# Patient Record
Sex: Female | Born: 1990 | Race: Black or African American | Hispanic: No | Marital: Single | Smoking: Never smoker
Health system: Southern US, Community
[De-identification: ages and names within clinical notes are randomized; demographics above are authoritative.]

## PROBLEM LIST (undated history)

## (undated) ENCOUNTER — Inpatient Hospital Stay (HOSPITAL_COMMUNITY): Payer: Self-pay

## (undated) DIAGNOSIS — N760 Acute vaginitis: Secondary | ICD-10-CM

## (undated) DIAGNOSIS — N390 Urinary tract infection, site not specified: Secondary | ICD-10-CM

## (undated) DIAGNOSIS — F32A Depression, unspecified: Secondary | ICD-10-CM

## (undated) DIAGNOSIS — F329 Major depressive disorder, single episode, unspecified: Secondary | ICD-10-CM

## (undated) DIAGNOSIS — A749 Chlamydial infection, unspecified: Secondary | ICD-10-CM

## (undated) DIAGNOSIS — G43909 Migraine, unspecified, not intractable, without status migrainosus: Secondary | ICD-10-CM

## (undated) DIAGNOSIS — F419 Anxiety disorder, unspecified: Secondary | ICD-10-CM

## (undated) DIAGNOSIS — B9689 Other specified bacterial agents as the cause of diseases classified elsewhere: Secondary | ICD-10-CM

## (undated) DIAGNOSIS — D649 Anemia, unspecified: Secondary | ICD-10-CM

## (undated) HISTORY — PX: NO PAST SURGERIES: SHX2092

---
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
## 2011-01-12 LAB — HIV ANTIBODY (ROUTINE TESTING W REFLEX): HIV: NONREACTIVE

## 2011-01-12 LAB — TYPE AND SCREEN: Antibody Screen: NEGATIVE

## 2011-01-27 ENCOUNTER — Emergency Department (HOSPITAL_COMMUNITY)
Admission: EM | Admit: 2011-01-27 | Discharge: 2011-01-28 | Disposition: A | Discharge status: Home / Self Care | Payer: Medicaid Other | Attending: Emergency Medicine | Admitting: Emergency Medicine

## 2011-01-27 DIAGNOSIS — R109 Unspecified abdominal pain: Secondary | ICD-10-CM | POA: Insufficient documentation

## 2011-01-27 LAB — URINALYSIS, ROUTINE W REFLEX MICROSCOPIC
Bilirubin Urine: NEGATIVE
Glucose, UA: NEGATIVE mg/dL
Hgb urine dipstick: NEGATIVE
Nitrite: NEGATIVE
Protein, ur: NEGATIVE mg/dL
Specific Gravity, Urine: 1.02 (ref 1.005–1.030)
Urobilinogen, UA: 0.2 mg/dL (ref 0.0–1.0)
pH: 5.5 (ref 5.0–8.0)

## 2011-04-16 ENCOUNTER — Emergency Department (HOSPITAL_COMMUNITY)
Admission: EM | Admit: 2011-04-16 | Discharge: 2011-04-16 | Disposition: A | Discharge status: Home / Self Care | Payer: Medicaid Other | Attending: Emergency Medicine | Admitting: Emergency Medicine

## 2011-04-16 DIAGNOSIS — A499 Bacterial infection, unspecified: Secondary | ICD-10-CM | POA: Insufficient documentation

## 2011-04-16 DIAGNOSIS — N390 Urinary tract infection, site not specified: Secondary | ICD-10-CM | POA: Insufficient documentation

## 2011-04-16 DIAGNOSIS — O2390 Unspecified genitourinary tract infection in pregnancy, unspecified trimester: Secondary | ICD-10-CM | POA: Insufficient documentation

## 2011-04-16 DIAGNOSIS — N760 Acute vaginitis: Secondary | ICD-10-CM | POA: Insufficient documentation

## 2011-04-16 DIAGNOSIS — B9689 Other specified bacterial agents as the cause of diseases classified elsewhere: Secondary | ICD-10-CM | POA: Insufficient documentation

## 2011-04-16 LAB — CBC
HCT: 29.7 % — ABNORMAL LOW (ref 36.0–46.0)
Hemoglobin: 9.9 g/dL — ABNORMAL LOW (ref 12.0–15.0)
MCH: 28.9 pg (ref 26.0–34.0)
MCHC: 33.3 g/dL (ref 30.0–36.0)
MCV: 86.8 fL (ref 78.0–100.0)
Platelets: 188 10*3/uL (ref 150–400)
RBC: 3.42 MIL/uL — ABNORMAL LOW (ref 3.87–5.11)
RDW: 12.9 % (ref 11.5–15.5)
WBC: 9.4 10*3/uL (ref 4.0–10.5)

## 2011-04-16 LAB — DIFFERENTIAL
Basophils Absolute: 0 10*3/uL (ref 0.0–0.1)
Basophils Relative: 0 % (ref 0–1)
Eosinophils Absolute: 0.1 10*3/uL (ref 0.0–0.7)
Eosinophils Relative: 1 % (ref 0–5)
Lymphocytes Relative: 22 % (ref 12–46)
Lymphs Abs: 2.1 10*3/uL (ref 0.7–4.0)
Monocytes Absolute: 0.8 10*3/uL (ref 0.1–1.0)
Monocytes Relative: 9 % (ref 3–12)
Neutro Abs: 6.4 10*3/uL (ref 1.7–7.7)
Neutrophils Relative %: 68 % (ref 43–77)

## 2011-04-16 LAB — URINALYSIS, ROUTINE W REFLEX MICROSCOPIC
Glucose, UA: NEGATIVE mg/dL
Hgb urine dipstick: NEGATIVE
Protein, ur: NEGATIVE mg/dL
Specific Gravity, Urine: >1.03 — ABNORMAL HIGH (ref 1.005–1.030)
Urobilinogen, UA: 0.2 mg/dL (ref 0.0–1.0)

## 2011-04-16 LAB — WET PREP, GENITAL
Trich, Wet Prep: NONE SEEN
Yeast Wet Prep HPF POC: NONE SEEN

## 2011-04-16 LAB — ABO/RH: ABO/RH(D): A NEG

## 2011-04-16 LAB — URINE MICROSCOPIC-ADD ON

## 2011-04-18 LAB — URINE CULTURE: Culture: NO GROWTH

## 2011-04-20 ENCOUNTER — Inpatient Hospital Stay (HOSPITAL_COMMUNITY)
Admission: AD | Admit: 2011-04-20 | Discharge: 2011-04-20 | Disposition: A | Discharge status: Home / Self Care | Payer: Medicaid Other | Source: Ambulatory Visit | Attending: Obstetrics & Gynecology | Admitting: Obstetrics & Gynecology

## 2011-04-20 DIAGNOSIS — O99891 Other specified diseases and conditions complicating pregnancy: Secondary | ICD-10-CM | POA: Insufficient documentation

## 2011-04-20 DIAGNOSIS — R109 Unspecified abdominal pain: Secondary | ICD-10-CM | POA: Insufficient documentation

## 2011-04-20 LAB — URINALYSIS, ROUTINE W REFLEX MICROSCOPIC
Bilirubin Urine: NEGATIVE
Hgb urine dipstick: NEGATIVE
Ketones, ur: NEGATIVE mg/dL
Nitrite: NEGATIVE
Protein, ur: NEGATIVE mg/dL
Urobilinogen, UA: 0.2 mg/dL (ref 0.0–1.0)

## 2011-04-20 LAB — WET PREP, GENITAL
Clue Cells Wet Prep HPF POC: NONE SEEN
Trich, Wet Prep: NONE SEEN

## 2011-05-07 ENCOUNTER — Inpatient Hospital Stay (HOSPITAL_COMMUNITY)
Admission: AD | Admit: 2011-05-07 | Discharge: 2011-05-07 | Disposition: A | Discharge status: Home / Self Care | Payer: Medicaid Other | Source: Ambulatory Visit | Attending: Family Medicine | Admitting: Family Medicine

## 2011-05-07 DIAGNOSIS — O4700 False labor before 37 completed weeks of gestation, unspecified trimester: Secondary | ICD-10-CM

## 2011-05-07 DIAGNOSIS — R109 Unspecified abdominal pain: Secondary | ICD-10-CM

## 2011-05-07 LAB — WET PREP, GENITAL
Clue Cells Wet Prep HPF POC: NONE SEEN
Trich, Wet Prep: NONE SEEN
Yeast Wet Prep HPF POC: NONE SEEN

## 2011-05-07 LAB — URINALYSIS, ROUTINE W REFLEX MICROSCOPIC
Glucose, UA: NEGATIVE mg/dL
Leukocytes, UA: NEGATIVE
Protein, ur: NEGATIVE mg/dL
Specific Gravity, Urine: 1.01 (ref 1.005–1.030)
pH: 7 (ref 5.0–8.0)

## 2011-05-08 ENCOUNTER — Emergency Department (HOSPITAL_COMMUNITY)
Admission: EM | Admit: 2011-05-08 | Discharge: 2011-05-09 | Disposition: A | Discharge status: Still In Hospital | Payer: Medicaid Other | Source: Home / Self Care | Attending: Emergency Medicine | Admitting: Emergency Medicine

## 2011-05-09 ENCOUNTER — Inpatient Hospital Stay (HOSPITAL_COMMUNITY)
Admission: AD | Admit: 2011-05-09 | Discharge: 2011-05-10 | DRG: 778 | Disposition: A | Discharge status: Home Health | Payer: Medicaid Other | Source: Other Acute Inpatient Hospital | Attending: Obstetrics & Gynecology | Admitting: Obstetrics & Gynecology

## 2011-05-09 DIAGNOSIS — O4700 False labor before 37 completed weeks of gestation, unspecified trimester: Secondary | ICD-10-CM

## 2011-05-09 LAB — URINALYSIS, ROUTINE W REFLEX MICROSCOPIC
Bilirubin Urine: NEGATIVE
Glucose, UA: 100 mg/dL — AB
Leukocytes, UA: NEGATIVE
Nitrite: NEGATIVE
Specific Gravity, Urine: >1.03 — ABNORMAL HIGH (ref 1.005–1.030)
pH: 7 (ref 5.0–8.0)

## 2011-05-09 LAB — GC/CHLAMYDIA PROBE AMP, GENITAL: Chlamydia, DNA Probe: NEGATIVE

## 2011-05-15 NOTE — Discharge Summary (Signed)
  Patricia [name], Patricia [name]              ACCOUNT NO.:  [id number]  MEDICAL RECORD NO.:  [id number]  LOCATION:  9158                          FACILITY:  [facility]  PHYSICIAN:  Maryelizabeth Kaufmann, MD  DATE OF BIRTH:  17-Jun-1991  DATE OF ADMISSION:  05/09/2011 DATE OF DISCHARGE:  05/10/2011                              DISCHARGE SUMMARY   ADMISSION DIAGNOSES: 1. With intrauterine pregnancy at 26 weeks and 5 days. 2. Threatened preterm labor.  DISCHARGE DIAGNOSES: 1. Intrauterine pregnancy at 26 weeks and 6 days. 2. Threatened preterm labor resolved.  ATTENDING:  Lazaro Arms, MD   FELLOW: Maryelizabeth Kaufmann, MD  DISCHARGE MEDICATIONS:  Procardia XL 30 mg 1 tablet p.o. b.i.d.  LABORATORY VALUES AND MEDICATIONS:  She did receive magnesium for the full 24 hours and she did receive betamethasone x2.  PROCEDURES:  None.  ADMISSION HISTORY AND PHYSICAL/HOSPITAL COURSE:  This is a 20 year old gravida 1 with intrauterine pregnancy at 26 weeks and 5 days presenting with contractions.  The patient was previously seen for threatened preterm labor but she did not fill her prescription.  Her fetal fibronectin was noted to see positive.  She was sen at an OSH and subsequently transferred.  She was not ruptured.  Her cervix was closed but 50% effaced.  Fetal heart rate tracing was category I.  The patient was admitted for preterm labor.  The patient was started on magnesium.  The patient was continued on that for the full betamethasone course.  She received one at the time of admission and subsequently the next day.  Following the completion of her betamethasone course, she was transitioned over to p.o. Procardia.  The patient did not have any contractions.  The patient's cervix was rechecked, was still found to be closed, 50%, -3.  She was discharged home in stable condition.  DISPOSITION:  Discharged home.  DISCHARGE CONDITION:  Stable.  FOLLOWUP:  The patient is to follow up in [facility]  in the morning as previously scheduled.  ER WARNING:  The patient to return to the emergency department if any fever, chills, nausea, vomiting, contractions, bleeding, spotting, or any other concerning symptoms.          ______________________________ Maryelizabeth Kaufmann, MD     LC/MEDQ  D:  05/10/2011  T:  05/11/2011  Job:  [id number]  Electronically Signed by Maryelizabeth Kaufmann MD on 05/15/2011 07:38:52 PM

## 2011-06-03 ENCOUNTER — Inpatient Hospital Stay (HOSPITAL_COMMUNITY): Payer: Medicaid Other | Admitting: Family Medicine

## 2011-06-03 ENCOUNTER — Encounter (HOSPITAL_COMMUNITY): Payer: Self-pay | Admitting: *Deleted

## 2011-06-03 ENCOUNTER — Inpatient Hospital Stay (HOSPITAL_COMMUNITY)
Admission: AD | Admit: 2011-06-03 | Discharge: 2011-06-03 | Disposition: A | Discharge status: Home / Self Care | Payer: Medicaid Other | Source: Ambulatory Visit | Attending: Family Medicine | Admitting: Family Medicine

## 2011-06-03 DIAGNOSIS — O9989 Other specified diseases and conditions complicating pregnancy, childbirth and the puerperium: Secondary | ICD-10-CM

## 2011-06-03 DIAGNOSIS — M549 Dorsalgia, unspecified: Secondary | ICD-10-CM

## 2011-06-03 DIAGNOSIS — O99891 Other specified diseases and conditions complicating pregnancy: Secondary | ICD-10-CM | POA: Insufficient documentation

## 2011-06-03 LAB — URINALYSIS, ROUTINE W REFLEX MICROSCOPIC
Hgb urine dipstick: NEGATIVE
Nitrite: NEGATIVE
Specific Gravity, Urine: 1.01 (ref 1.005–1.030)
Urobilinogen, UA: 0.2 mg/dL (ref 0.0–1.0)

## 2011-06-03 LAB — WET PREP, GENITAL: Trich, Wet Prep: NONE SEEN

## 2011-06-03 MED ORDER — CYCLOBENZAPRINE HCL 10 MG PO TABS
10.0000 mg | ORAL_TABLET | ORAL | Status: AC
  Administered 2011-06-03: 10 mg via ORAL
  Filled 2011-06-03: qty 1

## 2011-06-03 MED ORDER — CYCLOBENZAPRINE HCL 10 MG PO TABS: 10.0000 mg | ORAL_TABLET | Freq: Three times a day (TID) | ORAL | Status: DC | PRN

## 2011-06-03 NOTE — Progress Notes (Signed)
Back pain onset this morning all day, no vaginal bleeding, no discharge, no painful urination

## 2011-06-03 NOTE — ED Provider Notes (Signed)
History     Chief Complaint  Patient presents with  . Back Pain   HPI  OB History    Grav Para Term Preterm Abortions TAB SAB Ect Mult Living   1 0  0            Past Medical History  Diagnosis Date  . No pertinent past medical history   . Preterm labor without delivery in second trimester June 2012    Positive FFN in June    Past Surgical History  Procedure Date  . No past surgeries     History reviewed. No pertinent family history.  History  Substance Use Topics  . Smoking status: Never Smoker   . Smokeless tobacco: Never Used  . Alcohol Use: No    Allergies: No Known Allergies  Prescriptions prior to admission  Medication Sig Dispense Refill  . NIFEdipine (PROCARDIA XL/ADALAT-CC) 30 MG 24 hr tablet Take 30 mg by mouth 2 (two) times daily.          ROS Physical Exam   Blood pressure 115/58, pulse 86, temperature 98.5 F (36.9 C), temperature source Oral, resp. rate 16, height 5\' 7"  (1.702 m), weight 101.334 kg (223 lb 6.4 oz), unknown if currently breastfeeding.  Physical Exam Back pain relieved completely. States  Has some lower abd. Pain that comes and goes. No contractions on monitor. Suspect she may be due for her next dose of Procardia . WIll d/c home and have her take her med and rest tonight. PTL precautions.  MAU Course  Procedures  MDM

## 2011-06-03 NOTE — Progress Notes (Signed)
Pt c/o of lower backpain since 0230 this morning-it is cnstant-pt isd relaxed

## 2011-06-03 NOTE — ED Provider Notes (Signed)
History   G1 P0 at 30.2 weeks presents with c/o one day history of back pain. She states she had this same back pain a month ago associated with preterm labor, but this back pain only started today. It starts from the base of her neck and extends to the coccyx. No dysuria. No bleeding. No contractions.   Chief Complaint  Patient presents with  . Back Pain   Back Pain This is a recurrent problem. The current episode started today. The problem occurs constantly. The problem is unchanged. The pain is present in the gluteal, lumbar spine and thoracic spine. The quality of the pain is described as aching and cramping. The pain does not radiate. The pain is moderate. The pain is the same all the time. Pertinent negatives include no dysuria, fever or headaches.     Past Medical History  Diagnosis Date  . No pertinent past medical history   . Preterm labor without delivery in second trimester June 2012    Positive FFN in June    Past Surgical History  Procedure Date  . No past surgeries     History reviewed. No pertinent family history.  History  Substance Use Topics  . Smoking status: Never Smoker   . Smokeless tobacco: Never Used  . Alcohol Use: No    Allergies: No Known Allergies  No prescriptions prior to admission    [name]  Constitutional: Negative for fever.  Genitourinary: Negative for dysuria, urgency and frequency.  Musculoskeletal: Positive for back pain.  Neurological: Negative for headaches.   Physical Exam   Blood pressure 113/53, pulse 69, temperature 98.5 F (36.9 C), temperature source Oral, resp. rate 16, height 5\' 7"  (1.702 m), weight 101.334 kg (223 lb 6.4 oz).  Physical Exam  Constitutional: She is oriented to person, place, and time. She appears well-developed and well-nourished. No distress.  HENT:  Head: Normocephalic.  Neck: Normal range of motion. Neck supple.  Cardiovascular: Normal rate.   Respiratory: Effort normal.  GI: Soft.  She exhibits no distension. There is no tenderness. There is no rebound and no guarding.  Genitourinary: Vagina normal and uterus normal.  Musculoskeletal: Normal range of motion.  Neurological: She is alert and oriented to person, place, and time.  Skin: Skin is warm and dry.  FHR reassuring.  NO contractions .  Cervix closed/50%/-3/vtx  Unchanged from a month ago. GC/Chlamydia/Wet prep done  MAU Course  Procedures  MDM

## 2011-06-05 LAB — GC/CHLAMYDIA PROBE AMP, GENITAL
Chlamydia, DNA Probe: NEGATIVE
GC Probe Amp, Genital: NEGATIVE

## 2011-06-07 ENCOUNTER — Ambulatory Visit: Payer: Medicaid Other

## 2011-06-09 ENCOUNTER — Inpatient Hospital Stay (HOSPITAL_COMMUNITY)
Admission: AD | Admit: 2011-06-09 | Discharge: 2011-06-09 | Disposition: A | Discharge status: Home / Self Care | Payer: Medicaid Other | Source: Ambulatory Visit | Attending: Obstetrics & Gynecology | Admitting: Obstetrics & Gynecology

## 2011-06-09 DIAGNOSIS — O4700 False labor before 37 completed weeks of gestation, unspecified trimester: Secondary | ICD-10-CM | POA: Insufficient documentation

## 2011-06-09 DIAGNOSIS — B9689 Other specified bacterial agents as the cause of diseases classified elsewhere: Secondary | ICD-10-CM

## 2011-06-09 DIAGNOSIS — N760 Acute vaginitis: Secondary | ICD-10-CM

## 2011-06-09 DIAGNOSIS — O479 False labor, unspecified: Secondary | ICD-10-CM

## 2011-06-09 LAB — WET PREP, GENITAL: Trich, Wet Prep: NONE SEEN

## 2011-06-09 LAB — URINALYSIS, ROUTINE W REFLEX MICROSCOPIC
Bilirubin Urine: NEGATIVE
Ketones, ur: 15 mg/dL — AB
Nitrite: NEGATIVE
Urobilinogen, UA: 1 mg/dL (ref 0.0–1.0)

## 2011-06-09 MED ORDER — NIFEDIPINE 10 MG PO CAPS
ORAL_CAPSULE | ORAL | Status: AC
  Filled 2011-06-09: qty 1

## 2011-06-09 MED ORDER — NIFEDIPINE 10 MG PO CAPS
10.0000 mg | ORAL_CAPSULE | Freq: Once | ORAL | Status: AC
  Administered 2011-06-09: 10 mg via ORAL

## 2011-06-09 MED ORDER — METRONIDAZOLE 500 MG PO TABS: 500.0000 mg | ORAL_TABLET | Freq: Two times a day (BID) | ORAL | Status: AC

## 2011-06-09 MED ORDER — NIFEDIPINE ER OSMOTIC RELEASE 30 MG PO TB24: 30.0000 mg | ORAL_TABLET | Freq: Two times a day (BID) | ORAL | Status: DC

## 2011-06-09 NOTE — Progress Notes (Signed)
UP TO B-ROOM 

## 2011-06-09 NOTE — Progress Notes (Signed)
DR CAMPBELL- PELVIC EXAM- COLLECTED CX

## 2011-06-09 NOTE — Progress Notes (Signed)
DR CAMPBELL IN ROOM

## 2011-06-09 NOTE — Progress Notes (Signed)
UP TO B-ROOM  OFTEN

## 2011-06-09 NOTE — ED Provider Notes (Addendum)
Chief Complaint:  Labor Eval   Patricia [name] is  20 y.o. G1P0000 at [redacted]w[redacted]d presents complaining of Labor Eval . She states regular, every 8 minutes associated with none and vaginal bleeding, intact, ?ROM but denies any current or cont leakage, along with active No additional complaints  Obstetrical/Gynecological History: OB History    Grav Para Term Preterm Abortions TAB SAB Ect Mult Living   1 0  0          h/o PTL s/p procardia and BMZ   Past Medical History: Past Medical History  Diagnosis Date  . No pertinent past medical history   . Preterm labor without delivery in second trimester June 2012    Positive FFN in June    Past Surgical History: Past Surgical History  Procedure Date  . No past surgeries     Family History: No family history on file.  Social History: History  Substance Use Topics  . Smoking status: Never Smoker   . Smokeless tobacco: Never Used  . Alcohol Use: No    Allergies: No Known Allergies  Prescriptions prior to admission  Medication Sig Dispense Refill  . NIFEdipine (PROCARDIA XL/ADALAT-CC) 30 MG 24 hr tablet Take 30 mg by mouth 2 (two) times daily.        . prenatal vitamin w/FE, FA (PRENATAL 1 + 1) 27-1 MG TABS Take 1 tablet by mouth daily.        . cyclobenzaprine (FLEXERIL) 10 MG tablet Take 1 tablet (10 mg total) by mouth every 8 (eight) hours as needed for muscle spasms.  30 tablet  1    [name] - Negative except per HPI  Physical Exam   Blood pressure 112/70, pulse 100, temperature 98.6 F (37 C), resp. rate 18, height 5\' 7"  (1.702 m), weight 100.699 kg (222 lb), unknown if currently breastfeeding.  General: General appearance - alert, well appearing, and in no distress Chest - clear to auscultation, no wheezes, rales or rhonchi, symmetric air entry Heart - normal rate, regular rhythm, normal S1, S2, no murmurs, rubs, clicks or gallops Abdomen - soft, nontender, nondistended, no masses or organomegaly Gravid, size  cwd, vertex by ultrasound and leopolds Extremities - peripheral pulses normal, no pedal edema, no clubbing or cyanosis Skin - normal coloration and turgor, no rashes, no suspicious skin lesions noted Baseline: 140-145 bpm, Variability: Good {> 6 bpm), Accelerations: Reactive and Decelerations: Absent Initially every 2-5 minutes after procardia, none SVE: 1/25/-3   Labs: Recent Results (from the past 24 hour(s))  URINALYSIS, ROUTINE W REFLEX MICROSCOPIC   Collection Time   06/09/11  1:00 AM      Component Value Range   Color, Urine YELLOW  YELLOW    Appearance CLEAR  CLEAR    Specific Gravity, Urine 1.020  1.005 - 1.030    pH 7.0  5.0 - 8.0    Glucose, UA NEGATIVE  NEGATIVE (mg/dL)   Hgb urine dipstick MODERATE (*) NEGATIVE    Bilirubin Urine NEGATIVE  NEGATIVE    Ketones 15 (*) NEGATIVE (mg/dL)   Protein 161 (*) NEGATIVE (mg/dL)   Urobilinogen, UA 1.0  0.0 - 1.0 (mg/dL)   Nitrite NEGATIVE  NEGATIVE    Leukocytes, UA MODERATE (*) NEGATIVE   URINE MICROSCOPIC-ADD ON   Collection Time   06/09/11  1:00 AM      Component Value Range   Squamous Epithelial / LPF RARE  RARE    WBC, UA TOO NUMEROUS TO COUNT  <3 (WBC/hpf)  RBC / HPF TOO NUMEROUS TO COUNT  <3 (RBC/hpf)   Bacteria, UA MANY (*) RARE    Imaging Studies:  No results found.   Assessment: Patricia [name] is  20 y.o. G1P0000 at [redacted]w[redacted]d presents with threatened PTL.  Plan:  Pt s/p procardia with cessation of contractions. Pt fern neg. Cultures taken  Will restart pt on procardia and d/c home with ER warnings/close clinic follow up.   Zoe Nordin,LACHELLE7/13/20122:45 AM

## 2011-06-09 NOTE — Progress Notes (Signed)
Pt reports pain in pelvic area, comes and goes, x 5 hours. Hurts to urinate. ? Leaking fluid since 0430 yesterday

## 2011-06-09 NOTE — Progress Notes (Signed)
PT SAYS  SHE IS UNSURE IF SROM- CONTINUES TO HAVE FLUID- UNSURE IF URINE

## 2011-06-09 NOTE — Progress Notes (Signed)
PT SAYS SHE STARTED HURTING AT 830PM-   FEELING PRESSURE AND VOIDING ALOT

## 2011-06-10 ENCOUNTER — Inpatient Hospital Stay (HOSPITAL_COMMUNITY)
Admission: AD | Admit: 2011-06-10 | Discharge: 2011-06-11 | Disposition: A | Discharge status: Home / Self Care | Payer: Medicaid Other | Source: Ambulatory Visit | Attending: Family Medicine | Admitting: Family Medicine

## 2011-06-10 ENCOUNTER — Encounter (HOSPITAL_COMMUNITY): Payer: Self-pay

## 2011-06-10 DIAGNOSIS — O4700 False labor before 37 completed weeks of gestation, unspecified trimester: Secondary | ICD-10-CM

## 2011-06-10 DIAGNOSIS — O2340 Unspecified infection of urinary tract in pregnancy, unspecified trimester: Secondary | ICD-10-CM

## 2011-06-10 HISTORY — DX: Other specified bacterial agents as the cause of diseases classified elsewhere: N76.0

## 2011-06-10 HISTORY — DX: Urinary tract infection, site not specified: N39.0

## 2011-06-10 HISTORY — DX: Other specified bacterial agents as the cause of diseases classified elsewhere: B96.89

## 2011-06-10 HISTORY — DX: Chlamydial infection, unspecified: A74.9

## 2011-06-10 LAB — URINE MICROSCOPIC-ADD ON

## 2011-06-10 LAB — URINALYSIS, ROUTINE W REFLEX MICROSCOPIC
Nitrite: NEGATIVE
Specific Gravity, Urine: 1.02 (ref 1.005–1.030)
Urobilinogen, UA: 0.2 mg/dL (ref 0.0–1.0)

## 2011-06-10 MED ORDER — NIFEDIPINE 10 MG PO CAPS
10.0000 mg | ORAL_CAPSULE | Freq: Three times a day (TID) | ORAL | Status: AC
  Administered 2011-06-11: 10 mg via ORAL
  Filled 2011-06-10: qty 1

## 2011-06-10 NOTE — Progress Notes (Signed)
Patient is here with c/o abdominal tightening that is now intense since she returned from her baby shower at 1800pm. She denies any vaginal bleeding, lof or discharge. She reports good fetal movement. C/o feeling nauseated.

## 2011-06-10 NOTE — Progress Notes (Signed)
Pain in pelvic area, more on L side, and goes around to my back. Having some contractions. Started Thurs night and was seen [facility]. Given pain medicine but is not helping. Has been having this pain the entire pregnancy. Denies bleeding or d/c

## 2011-06-11 LAB — CBC
MCH: 29.1 pg (ref 26.0–34.0)
MCHC: 34 g/dL (ref 30.0–36.0)
MCV: 85.6 fL (ref 78.0–100.0)
Platelets: 193 10*3/uL (ref 150–400)
RDW: 12.9 % (ref 11.5–15.5)

## 2011-06-11 LAB — CULTURE, BETA STREP (GROUP B ONLY)

## 2011-06-11 LAB — DIFFERENTIAL
Basophils Absolute: 0 10*3/uL (ref 0.0–0.1)
Eosinophils Absolute: 0.1 10*3/uL (ref 0.0–0.7)
Eosinophils Relative: 1 % (ref 0–5)

## 2011-06-11 MED ORDER — CEPHALEXIN 500 MG PO CAPS: 500.0000 mg | ORAL_CAPSULE | Freq: Four times a day (QID) | ORAL | Status: AC

## 2011-06-11 MED ORDER — OXYCODONE-ACETAMINOPHEN 5-325 MG PO TABS
2.0000 | ORAL_TABLET | Freq: Once | ORAL | Status: AC
  Administered 2011-06-11: 2 via ORAL
  Filled 2011-06-11: qty 2

## 2011-06-11 MED ORDER — LACTATED RINGERS IV SOLN
INTRAVENOUS | Status: DC
  Administered 2011-06-11: 03:00:00 via INTRAVENOUS

## 2011-06-11 MED ORDER — NIFEDIPINE 10 MG PO CAPS
10.0000 mg | ORAL_CAPSULE | Freq: Once | ORAL | Status: AC
  Administered 2011-06-11: 10 mg via ORAL
  Filled 2011-06-11: qty 1

## 2011-06-11 MED ORDER — CEPHALEXIN 500 MG PO CAPS
500.0000 mg | ORAL_CAPSULE | Freq: Once | ORAL | Status: DC
  Filled 2011-06-11: qty 1

## 2011-06-11 NOTE — Progress Notes (Signed)
Patient seen at 0300.  States contractions are more painful than before. Discussed reassuring finding of no cervical change. UCs appear to be less frequent, lasting 45 seconds. WBC normal. Discussed discharge home and pt states "if you send me home, I will come right back".  Dr Shawnie Pons consulted. She recommended one liter of IVF and Percocet for pain. Will reevaluate for d/c home later. FHR reactive.

## 2011-06-11 NOTE — ED Provider Notes (Signed)
History     Chief Complaint  Patient presents with  . Abdominal Pain    Thinks having ctxs every . Pelvic pain   HPI Presents with c/o painful contractions this evening. States her last Procardia was at 7pm.  States she takes it twice per day. Denies leaking or bleeding. Has appt at [facility] Monday.  Was treated for PTL in June with + FFN.  Was started on 30 days of Procardia. Last Cervix exam 2 days ago was 1/25/-3 by Dr Orvan Falconer.     Past Medical History  Diagnosis Date  . No pertinent past medical history   . Preterm labor without delivery in second trimester June 2012    Positive FFN in June  . UTI (lower urinary tract infection)   . Bacterial vaginosis   . Chlamydia     History reviewed. No pertinent past surgical history.  No family history on file.  History  Substance Use Topics  . Smoking status: Never Smoker   . Smokeless tobacco: Never Used  . Alcohol Use: No    Allergies: No Known Allergies  Prescriptions prior to admission  Medication Sig Dispense Refill  . cyclobenzaprine (FLEXERIL) 10 MG tablet Take 1 tablet (10 mg total) by mouth every 8 (eight) hours as needed for muscle spasms.  30 tablet  1  . metroNIDAZOLE (FLAGYL) 500 MG tablet Take 1 tablet (500 mg total) by mouth 2 (two) times daily.  14 tablet  0  . NIFEdipine (PROCARDIA XL/ADALAT-CC) 30 MG 24 hr tablet Take 1 tablet (30 mg total) by mouth 2 (two) times daily.  60 tablet  0  . prenatal vitamin w/FE, FA (PRENATAL 1 + 1) 27-1 MG TABS Take 1 tablet by mouth daily.          [name]  Constitutional: Negative for fever and chills.  Gastrointestinal: Positive for nausea and abdominal pain. Negative for vomiting, diarrhea and constipation.    Physical Exam   Blood pressure 120/66, pulse 93, temperature 98.8 F (37.1 C), temperature source Oral, resp. rate 20, height 5\' 7"  (1.702 m), weight 99.519 kg (219 lb 6.4 oz), unknown if currently breastfeeding.  Physical Exam    Constitutional: She is oriented to person, place, and time. She appears well-developed and well-nourished.  HENT:  Head: Normocephalic.  Cardiovascular: Normal rate.   Respiratory: Effort normal.  GI: Soft. There is tenderness (diffusely tender). There is no rebound and no guarding.  Genitourinary: Vagina normal and uterus normal.       Cervix 1cm/50/-3.  FHR reassuring with uterine contractions every 2-4 minutes  Musculoskeletal: Normal range of motion.  Neurological: She is alert and oriented to person, place, and time.  Skin: Skin is warm and dry.    MAU Course  Procedures  A:  IUP at 31 weeks History of + FFN last month Preterm Contractions  P:  Nifedipine

## 2011-06-11 NOTE — Progress Notes (Signed)
UA results reviewed and found to be suspicious for UTI. Will treat with Keflex and d/c home.

## 2011-06-11 NOTE — Progress Notes (Signed)
Patient appears to be sleeping. When she awakens she states her pain is NO better after the med. States her back and stomach hurt. Cervix rechecked and is unchanged. FHR 145, reassuring. Uterine irritability noted at intervals. Will check CBC as pt states her abdomen is tender.

## 2011-06-21 ENCOUNTER — Ambulatory Visit: Payer: Medicaid Other

## 2011-06-24 ENCOUNTER — Inpatient Hospital Stay (HOSPITAL_COMMUNITY)
Admission: AD | Admit: 2011-06-24 | Discharge: 2011-06-24 | Disposition: A | Discharge status: Home / Self Care | Payer: Medicaid Other | Source: Ambulatory Visit | Attending: Obstetrics & Gynecology | Admitting: Obstetrics & Gynecology

## 2011-06-24 ENCOUNTER — Encounter (HOSPITAL_COMMUNITY): Payer: Self-pay | Admitting: *Deleted

## 2011-06-24 DIAGNOSIS — O4700 False labor before 37 completed weeks of gestation, unspecified trimester: Secondary | ICD-10-CM

## 2011-06-24 LAB — URINALYSIS, ROUTINE W REFLEX MICROSCOPIC
Bilirubin Urine: NEGATIVE
Ketones, ur: NEGATIVE mg/dL
Nitrite: NEGATIVE
Protein, ur: 100 mg/dL — AB

## 2011-06-24 LAB — FETAL FIBRONECTIN: Fetal Fibronectin: POSITIVE — AB

## 2011-06-24 LAB — WET PREP, GENITAL: Trich, Wet Prep: NONE SEEN

## 2011-06-24 LAB — URINE MICROSCOPIC-ADD ON

## 2011-06-24 MED ORDER — METRONIDAZOLE 500 MG PO TABS: 500.0000 mg | ORAL_TABLET | Freq: Two times a day (BID) | ORAL | Status: AC

## 2011-06-24 NOTE — ED Provider Notes (Signed)
Chief Complaint:  Contractions   Patricia [name] is  20 y.o. G1P0000 at [redacted]w[redacted]d presents complaining of Contractions She states regular, every 5-7 minutes associated with no bleeding, intact membranes, along with active fetal movement. Patient is s/p admission for PTl in 04/2011; has been on Procardia once daily since then.  Took Procardia today and reports drinking a lot of water, but still had contractions.  Denies any recent vaginal exam or intercourse.  Obstetrical/Gynecological History: G1P0, no pertinent GYN concerns   Past Medical History: Past Medical History  Diagnosis Date  . No pertinent past medical history   . Preterm labor without delivery in second trimester June 2012    Positive FFN in June  . UTI (lower urinary tract infection)   . Bacterial vaginosis   . Chlamydia     Past Surgical History: History reviewed. No pertinent past surgical history.  Family History: No family history on file.  Social History: History  Substance Use Topics  . Smoking status: Never Smoker   . Smokeless tobacco: Never Used  . Alcohol Use: No    Allergies: No Known Allergies  Prescriptions prior to admission  Medication Sig Dispense Refill  . cyclobenzaprine (FLEXERIL) 10 MG tablet Take 1 tablet (10 mg total) by mouth every 8 (eight) hours as needed for muscle spasms.  30 tablet  1  . metroNIDAZOLE (FLAGYL) 500 MG tablet Take 500 mg by mouth 2 (two) times daily.        Marland Kitchen NIFEdipine (PROCARDIA XL/ADALAT-CC) 30 MG 24 hr tablet Take 1 tablet (30 mg total) by mouth 2 (two) times daily.  60 tablet  0  . prenatal vitamin w/FE, FA (PRENATAL 1 + 1) 27-1 MG TABS Take 1 tablet by mouth daily.        . cephALEXin (KEFLEX) 500 MG capsule Take 500 mg by mouth 4 (four) times daily. Pt thinks this was for bladder infection        [name] - Negative except what is mentioned in HPI.  Physical Exam   Blood pressure 113/76, pulse 94, temperature 98.6 F (37 C), temperature source  Oral, resp. rate 18, height 5' 7.25" (1.708 m), weight 102.711 kg (226 lb 7 oz), not currently breastfeeding.  General: General appearance - alert, well appearing, and in no distress Abdomen - soft, nontender, nondistended, no masses or organomegaly Pelvic - gravid, NT; cervix 1/long, FFN done.  Also collected GC/Chlam, wet prep, GBS samples. Baseline: 140 bpm, Variability: moderate, Accelerations: Reactive and Decelerations: Absent irregular, every 5-8 minutes   Labs: Recent Results (from the past 24 hour(s))  URINALYSIS, ROUTINE W REFLEX MICROSCOPIC   Collection Time   06/24/11  7:30 PM      Component Value Range   Color, Urine YELLOW  YELLOW    Appearance CLEAR  CLEAR    Specific Gravity, Urine 1.020  1.005 - 1.030    pH 6.5  5.0 - 8.0    Glucose, UA NEGATIVE  NEGATIVE (mg/dL)   Hgb urine dipstick SMALL (*) NEGATIVE    Bilirubin Urine NEGATIVE  NEGATIVE    Ketones, ur NEGATIVE  NEGATIVE (mg/dL)   Protein, ur 161 (*) NEGATIVE (mg/dL)   Urobilinogen, UA 0.2  0.0 - 1.0 (mg/dL)   Nitrite NEGATIVE  NEGATIVE    Leukocytes, UA SMALL (*) NEGATIVE   URINE MICROSCOPIC-ADD ON   Collection Time   06/24/11  7:30 PM      Component Value Range   Squamous Epithelial / LPF RARE  RARE  WBC, UA 21-50  <3 (WBC/hpf)   RBC / HPF 11-20  <3 (RBC/hpf)   Bacteria, UA MANY (*) RARE   WET PREP, GENITAL   Collection Time   06/24/11  8:20 PM      Component Value Range   Yeast, Wet Prep NONE SEEN  NONE SEEN    Trich, Wet Prep NONE SEEN  NONE SEEN    Clue Cells, Wet Prep MODERATE (*) NONE SEEN    WBC, Wet Prep HPF POC FEW (*) NONE SEEN    Imaging Studies:  No results found.   Assessment: Patricia [name] is  20 y.o. G1P0000 at [redacted]w[redacted]d presents with preterm contractions.  Wet prep shows moderate clue cells, UA neg.  FFN positive.  Plan: FFN is positive, but in the absence of frequent contractions or cervical change,likely is false positive.  Patient to continue Procardia at home, now taking daily,  can increase to bid as needed. Flagyl Rx given for BV ordered to pharmacy. PTL/FM precautions reviewed. Next appointment at [facility] is 06/27/11.  ANYANWU,UGONNA A 06/24/2011 9:42 PM

## 2011-06-24 NOTE — Progress Notes (Signed)
Dr Macon Large in to see pt. EFm reviewed. Spec exam done and GC/Chlam, wet prep, FFN and GBS obtained. Pt tol well.

## 2011-06-24 NOTE — Progress Notes (Signed)
G1 at 33.3wks. Hx PTL at 25wks. Admitted on Magnesium and home on PRocardia daily. At 1700 started having pelvic pressure and pelvic tightening which was coming and going. Denies bleeding or vag d/c

## 2011-06-24 NOTE — ED Notes (Signed)
Dr Orvan Falconer called and in OR. Darlene RN will let MD know of pt's admission to [facility]U.

## 2011-06-24 NOTE — Progress Notes (Signed)
Pt states, " I have had tightening off and on since 5:00 pm. They are about every 7 min."

## 2011-06-24 NOTE — Progress Notes (Signed)
Dr Macon Large notified of positive FFN. Aware of one ctx in past or so and pt on L side texting and eating crackers. Will come reck cervix.

## 2011-06-24 NOTE — Progress Notes (Signed)
Written and verbal d/c instructions given and understanding voiced. Dr Macon Large had given pt instructions as well after repeat sve.

## 2011-06-24 NOTE — Progress Notes (Signed)
Pt had turned to L side. FHR not recording well. Trans adj.

## 2011-06-28 ENCOUNTER — Ambulatory Visit: Payer: Medicaid Other

## 2011-06-28 LAB — CULTURE, BETA STREP (GROUP B ONLY)

## 2011-07-06 ENCOUNTER — Encounter (HOSPITAL_COMMUNITY): Payer: Self-pay | Admitting: *Deleted

## 2011-07-06 ENCOUNTER — Inpatient Hospital Stay (HOSPITAL_COMMUNITY)
Admission: AD | Admit: 2011-07-06 | Discharge: 2011-07-06 | Disposition: A | Discharge status: Home / Self Care | Payer: Medicaid Other | Source: Ambulatory Visit | Attending: Obstetrics & Gynecology | Admitting: Obstetrics & Gynecology

## 2011-07-06 DIAGNOSIS — O2340 Unspecified infection of urinary tract in pregnancy, unspecified trimester: Secondary | ICD-10-CM

## 2011-07-06 DIAGNOSIS — M79609 Pain in unspecified limb: Secondary | ICD-10-CM | POA: Insufficient documentation

## 2011-07-06 DIAGNOSIS — N390 Urinary tract infection, site not specified: Secondary | ICD-10-CM | POA: Insufficient documentation

## 2011-07-06 DIAGNOSIS — M549 Dorsalgia, unspecified: Secondary | ICD-10-CM

## 2011-07-06 DIAGNOSIS — O2390 Unspecified genitourinary tract infection in pregnancy, unspecified trimester: Secondary | ICD-10-CM | POA: Insufficient documentation

## 2011-07-06 LAB — URINALYSIS, ROUTINE W REFLEX MICROSCOPIC
Bilirubin Urine: NEGATIVE
Nitrite: NEGATIVE
Specific Gravity, Urine: 1.01 (ref 1.005–1.030)
Urobilinogen, UA: 0.2 mg/dL (ref 0.0–1.0)
pH: 7 (ref 5.0–8.0)

## 2011-07-06 LAB — URINE MICROSCOPIC-ADD ON

## 2011-07-06 MED ORDER — CEPHALEXIN 500 MG PO CAPS: 500.0000 mg | ORAL_CAPSULE | Freq: Two times a day (BID) | ORAL | Status: AC

## 2011-07-06 NOTE — Progress Notes (Signed)
Pt states she started having bad low back pain last night, continues today and wraps around to lower abdomen and gets tight. Denies bleeding but does have a thick clear discharge. Reports good fetal movement.

## 2011-07-06 NOTE — ED Provider Notes (Signed)
History    Patricia [name] 20 yo G1P0000 with a cc of lower back pain.   CSN: [id number] Arrival date & time: 07/06/2011  3:03 PM  Chief Complaint  Patient presents with  . Back Pain   HPI Comments: [name] is a 20 yo G1P0 EGA [redacted] weeks 0 days who presents today with a cc of lower back pain.  This is a recurrent problem. The current episode started today at 1 am. Pt reports pain every 7-10 minutes. The pain is present in the lumbar spine. The quality of the pain is described as cramping. The pain does not radiate. The pain is at a severity of 8/10. Associated symptoms include abdominal pain and back pain. Pertinent negatives include no bladder incontinence, dysuria, fever, headaches, leg pain or perianal numbness. She has tried bed rest and muscle relaxant for the symptoms. The treatment provided moderate relief.  Denies vaginal bleeding, RUQ pain. She reports urinary frequency and voiding large amounts.    Back Pain    Past Medical History  Diagnosis Date  . No pertinent past medical history   . Preterm labor without delivery in second trimester June 2012    Positive FFN in June  . UTI (lower urinary tract infection)   . Bacterial vaginosis   . Chlamydia     History reviewed. No pertinent past surgical history.  History reviewed. No pertinent family history.  History  Substance Use Topics  . Smoking status: Never Smoker   . Smokeless tobacco: Never Used  . Alcohol Use: No    OB History    Grav Para Term Preterm Abortions TAB SAB Ect Mult Living   1 0  0            [name]  Gastrointestinal: Negative for nausea, vomiting, diarrhea and constipation.  Genitourinary: Positive for frequency, flank pain and vaginal discharge. Negative for vaginal bleeding and vaginal pain.       Pt states had a clear/white mucous dc with no odor.  Musculoskeletal: Positive for back pain. Negative for gait problem.  Neurological: Positive for dizziness. Negative for  light-headedness.    Physical Exam  BP 120/71  Pulse 90  Temp(Src) 98.3 F (36.8 C) (Oral)  Resp 16  Ht 5\' 7"  (1.702 m)  Wt 101.696 kg (224 lb 3.2 oz)  BMI 35.11 kg/m2  SpO2 99%  Physical Exam  Constitutional: She is oriented to person, place, and time. She appears well-developed and well-nourished.  HENT:  Head: Normocephalic.  Cardiovascular: Normal rate, regular rhythm and normal heart sounds.   Pulmonary/Chest: Effort normal and breath sounds normal.  Abdominal: There is tenderness.  Neurological: She is alert and oriented to person, place, and time. She has normal reflexes.   FHT: 130s, Moderate variability, + accelerations, no decelerations. Toco - No ctxs seen.  Assessment & Plan  Procedures 1.  UTI Will prescribe keflex 500mg  BID x 7 days.  Urine Culture sent.  Pt has appt with OB on Tuesday.  Pt instructed to return if develops nausea, vomiting, fevers.  2.  Back pain - musculoskeletal in nature. Recommended warm compresses, tylenol.  Payton Doughty PA-S2 07/06/11 16:33

## 2011-07-08 LAB — URINE CULTURE
Colony Count: 50000
Culture  Setup Time: 201208100150

## 2011-07-12 ENCOUNTER — Encounter: Payer: Medicaid Other | Attending: Obstetrics & Gynecology

## 2011-07-12 DIAGNOSIS — O99810 Abnormal glucose complicating pregnancy: Secondary | ICD-10-CM | POA: Insufficient documentation

## 2011-07-12 DIAGNOSIS — Z713 Dietary counseling and surveillance: Secondary | ICD-10-CM | POA: Insufficient documentation

## 2011-07-22 ENCOUNTER — Inpatient Hospital Stay (HOSPITAL_COMMUNITY)
Admission: AD | Admit: 2011-07-22 | Discharge: 2011-07-22 | Disposition: A | Discharge status: Home / Self Care | Payer: Medicaid Other | Source: Ambulatory Visit | Attending: Obstetrics and Gynecology | Admitting: Obstetrics and Gynecology

## 2011-07-22 DIAGNOSIS — O99891 Other specified diseases and conditions complicating pregnancy: Secondary | ICD-10-CM | POA: Insufficient documentation

## 2011-07-22 DIAGNOSIS — N949 Unspecified condition associated with female genital organs and menstrual cycle: Secondary | ICD-10-CM | POA: Insufficient documentation

## 2011-07-22 LAB — URINALYSIS, ROUTINE W REFLEX MICROSCOPIC
Bilirubin Urine: NEGATIVE
Glucose, UA: NEGATIVE mg/dL
Ketones, ur: NEGATIVE mg/dL
Specific Gravity, Urine: 1.01 (ref 1.005–1.030)
pH: 6 (ref 5.0–8.0)

## 2011-07-22 LAB — URINE MICROSCOPIC-ADD ON

## 2011-07-22 NOTE — Progress Notes (Signed)
Onset of vaginal, back and abdominal pain yesterday morning, went to [facility] on Thursday morning (nothing really done)

## 2011-07-23 NOTE — Progress Notes (Signed)
  Patient was seen on 07/12/2011 for Gestational Diabetes self-management class at the [facility]. The following learning objectives were met by the patient during this course:   States the definition of Gestational Diabetes  States why dietary management is important in controlling blood glucose  Describes the effects each nutrient has on blood glucose levels  Demonstrates ability to create a balanced meal plan  Demonstrates carbohydrate counting   States when to check blood glucose levels  Demonstrates proper blood glucose monitoring techniques  States the effect of stress and exercise on blood glucose levels  States the importance of limiting caffeine and abstaining from alcohol and smoking  Blood glucose monitor given: [employer] Monitoring System  Lot # [id number] Exp: 10/26/2012 Blood glucose reading: 79 mg  Patient instructed to monitor glucose levels:Monitor fasting and 2 hours past meals FBS: 60 - <90 1 hour: <140 2 hour: <120  *Patient received handouts:  Nutrition Diabetes and Pregnancy  Carbohydrate Counting List  Patient will be seen for follow-up as needed.

## 2011-07-25 ENCOUNTER — Inpatient Hospital Stay (HOSPITAL_COMMUNITY)
Admission: AD | Admit: 2011-07-25 | Discharge: 2011-07-26 | Disposition: A | Discharge status: Home / Self Care | Payer: Medicaid Other | Source: Ambulatory Visit | Attending: Family Medicine | Admitting: Family Medicine

## 2011-07-25 DIAGNOSIS — Z0371 Encounter for suspected problem with amniotic cavity and membrane ruled out: Secondary | ICD-10-CM | POA: Insufficient documentation

## 2011-07-25 DIAGNOSIS — O99810 Abnormal glucose complicating pregnancy: Secondary | ICD-10-CM | POA: Insufficient documentation

## 2011-07-25 NOTE — Progress Notes (Signed)
Pt reports earlier today she had a clear mucus discharge. Had watery discharge after that (at about 2215) and she called the doctor's office and they told her to come in

## 2011-07-26 NOTE — ED Provider Notes (Signed)
Chief Complaint:  Labor Eval   Patricia [name] is  20 y.o. G1P0000 at [redacted]w[redacted]d presents complaining of Labor Eval Pt states had some clear mucous drip down her leg this evening.  Called [facility] where she gets her prenatal and was told to come in for evaluation.  No contractions.  Prenatal course complicated by preterm labor at 5-58months and again at 7 months.  Doing well since then.  States diet controlled gestation diabetes.  Has appointment as FT on 9/5.  Denies n/v/d/f/c.  She states no contractions associated with no loss of fluid or vaginal bleeding.  Good fetal movement.   Obstetrical/Gynecological History: OB History    Grav Para Term Preterm Abortions TAB SAB Ect Mult Living   1 0  0            Past Medical History: Past Medical History  Diagnosis Date  . No pertinent past medical history   . Preterm labor without delivery in second trimester June 2012    Positive FFN in June  . UTI (lower urinary tract infection)   . Bacterial vaginosis   . Chlamydia     Past Surgical History: No past surgical history on file.  Family History: No family history on file.  Social History: History  Substance Use Topics  . Smoking status: Never Smoker   . Smokeless tobacco: Never Used  . Alcohol Use: No    Allergies: No Known Allergies  Prescriptions prior to admission  Medication Sig Dispense Refill  . acetaminophen (TYLENOL) 500 MG tablet Take 500 mg by mouth every 6 (six) hours as needed. For pain       . prenatal vitamin w/FE, FA (PRENATAL 1 + 1) 27-1 MG TABS Take 1 tablet by mouth daily.       . fluconazole (DIFLUCAN) 150 MG tablet Take 150 mg by mouth.        Marland Kitchen NIFEdipine (PROCARDIA XL/ADALAT-CC) 30 MG 24 hr tablet Take 1 tablet (30 mg total) by mouth 2 (two) times daily.  60 tablet  0    [name] - Negative except as in HPI.  Physical Exam   Blood pressure 129/79, pulse 94, temperature 98.3 F (36.8 C), temperature source Oral, resp. rate 16, height 5\' 7"   (1.702 m), weight 231 lb (104.781 kg).  General: General appearance - alert, well appearing, and in no distress Mouth - mucous membranes moist, pharynx normal without lesions Abdomen - gravid, non-tender Pelvic - small amount of thin white discharge present on sterile speculum exam; sample collected for fern test Extremities - peripheral pulses normal, no pedal edema, no clubbing or cyanosis Skin - normal coloration and turgor, no rashes, no suspicious skin lesions noted Baseline: 140 bpm, Variability: Good {> 6 bpm), Accelerations: Reactive and Decelerations: Absent irregular, every >10 minutes   Labs: No results found for this or any previous visit (from the past 24 hour(s)). Ferning test - negative Imaging Studies:  No results found.   Assessment: Patricia [name] is  20 y.o. G1P0000 at [redacted]w[redacted]d presents with labor eval; not in active labor.  Plan: Will give labor precautions Discharge home  BOOTH, ERIN8/29/20121:12 AM

## 2011-07-26 NOTE — Progress Notes (Signed)
DENIES HSV AND MRSA.  SAYS HURT BAD AT 630PM.

## 2011-07-30 ENCOUNTER — Inpatient Hospital Stay (HOSPITAL_COMMUNITY): Payer: Medicaid Other | Admitting: Anesthesiology

## 2011-07-30 ENCOUNTER — Encounter (HOSPITAL_COMMUNITY): Payer: Self-pay | Admitting: Anesthesiology

## 2011-07-30 ENCOUNTER — Inpatient Hospital Stay (HOSPITAL_COMMUNITY)
Admission: AD | Admit: 2011-07-30 | Discharge: 2011-07-30 | Disposition: A | Discharge status: Home / Self Care | Payer: Medicaid Other | Source: Ambulatory Visit | Attending: Obstetrics and Gynecology | Admitting: Obstetrics and Gynecology

## 2011-07-30 ENCOUNTER — Inpatient Hospital Stay (HOSPITAL_COMMUNITY)
Admission: AD | Admit: 2011-07-30 | Discharge: 2011-08-02 | DRG: 775 | Disposition: A | Discharge status: Home / Self Care | Payer: Medicaid Other | Source: Ambulatory Visit | Attending: Obstetrics and Gynecology | Admitting: Obstetrics and Gynecology

## 2011-07-30 ENCOUNTER — Encounter (HOSPITAL_COMMUNITY): Payer: Self-pay | Admitting: *Deleted

## 2011-07-30 DIAGNOSIS — O99892 Other specified diseases and conditions complicating childbirth: Secondary | ICD-10-CM | POA: Diagnosis present

## 2011-07-30 DIAGNOSIS — O479 False labor, unspecified: Secondary | ICD-10-CM | POA: Insufficient documentation

## 2011-07-30 DIAGNOSIS — O99814 Abnormal glucose complicating childbirth: Principal | ICD-10-CM | POA: Diagnosis present

## 2011-07-30 DIAGNOSIS — Z22330 Carrier of Group B streptococcus: Secondary | ICD-10-CM

## 2011-07-30 LAB — CBC
HCT: 31.2 % — ABNORMAL LOW (ref 36.0–46.0)
Hemoglobin: 10.4 g/dL — ABNORMAL LOW (ref 12.0–15.0)
MCH: 28.3 pg (ref 26.0–34.0)
MCHC: 33.3 g/dL (ref 30.0–36.0)
MCV: 84.8 fL (ref 78.0–100.0)
Platelets: 204 10*3/uL (ref 150–400)
RBC: 3.68 MIL/uL — ABNORMAL LOW (ref 3.87–5.11)
RDW: 12.8 % (ref 11.5–15.5)
WBC: 9.8 10*3/uL (ref 4.0–10.5)

## 2011-07-30 MED ORDER — OXYTOCIN BOLUS FROM INFUSION
500.0000 mL | Freq: Once | INTRAVENOUS | Status: DC
  Filled 2011-07-30: qty 500

## 2011-07-30 MED ORDER — EPHEDRINE 5 MG/ML INJ
10.0000 mg | INTRAVENOUS | Status: DC | PRN
Start: 2011-07-30 — End: 2011-08-02
  Filled 2011-07-30: qty 4

## 2011-07-30 MED ORDER — FLEET ENEMA 7-19 GM/118ML RE ENEM: 1.0000 | ENEMA | RECTAL | Status: DC | PRN

## 2011-07-30 MED ORDER — PRENATAL PLUS 27-1 MG PO TABS
1.0000 | ORAL_TABLET | Freq: Every day | ORAL | Status: DC
  Administered 2011-07-31 – 2011-08-02 (×3): 1 via ORAL
  Filled 2011-07-30 (×6): qty 1

## 2011-07-30 MED ORDER — DIPHENHYDRAMINE HCL 50 MG/ML IJ SOLN: 12.5000 mg | INTRAMUSCULAR | Status: DC | PRN

## 2011-07-30 MED ORDER — NALBUPHINE SYRINGE 5 MG/0.5 ML: 10.0000 mg | INJECTION | INTRAMUSCULAR | Status: DC | PRN

## 2011-07-30 MED ORDER — OXYCODONE-ACETAMINOPHEN 5-325 MG PO TABS: 2.0000 | ORAL_TABLET | ORAL | Status: DC | PRN

## 2011-07-30 MED ORDER — PHENYLEPHRINE 40 MCG/ML (10ML) SYRINGE FOR IV PUSH (FOR BLOOD PRESSURE SUPPORT)
80.0000 ug | PREFILLED_SYRINGE | INTRAVENOUS | Status: DC | PRN
  Filled 2011-07-30 (×2): qty 5

## 2011-07-30 MED ORDER — LIDOCAINE HCL (PF) 1 % IJ SOLN
30.0000 mL | INTRAMUSCULAR | Status: DC | PRN
  Filled 2011-07-30: qty 30

## 2011-07-30 MED ORDER — ZOLPIDEM TARTRATE 10 MG PO TABS
10.0000 mg | ORAL_TABLET | Freq: Once | ORAL | Status: AC
  Administered 2011-07-30: 10 mg via ORAL
  Filled 2011-07-30: qty 1

## 2011-07-30 MED ORDER — CITRIC ACID-SODIUM CITRATE 334-500 MG/5ML PO SOLN: 30.0000 mL | ORAL | Status: DC | PRN

## 2011-07-30 MED ORDER — FENTANYL 2.5 MCG/ML BUPIVACAINE 1/10 % EPIDURAL INFUSION (WH - ANES)
INTRAMUSCULAR | Status: DC | PRN
  Administered 2011-07-30: 14 mL/h via EPIDURAL

## 2011-07-30 MED ORDER — LACTATED RINGERS IV SOLN: 500.0000 mL | Freq: Once | INTRAVENOUS | Status: DC

## 2011-07-30 MED ORDER — OXYTOCIN 20 UNITS IN LACTATED RINGERS INFUSION - SIMPLE
125.0000 mL/h | Freq: Once | INTRAVENOUS | Status: DC
  Administered 2011-07-31: 999 mL/h via INTRAVENOUS
  Filled 2011-07-30: qty 1000

## 2011-07-30 MED ORDER — LACTATED RINGERS IV SOLN
INTRAVENOUS | Status: DC
  Administered 2011-07-30: 21:00:00 via INTRAVENOUS

## 2011-07-30 MED ORDER — EPHEDRINE 5 MG/ML INJ
10.0000 mg | INTRAVENOUS | Status: DC | PRN
  Filled 2011-07-30 (×2): qty 4

## 2011-07-30 MED ORDER — SODIUM CHLORIDE 0.9 % IV SOLN
2.0000 g | Freq: Once | INTRAVENOUS | Status: DC
  Administered 2011-07-30: 2 g via INTRAVENOUS
  Filled 2011-07-30: qty 2000

## 2011-07-30 MED ORDER — ACETAMINOPHEN 325 MG PO TABS: 650.0000 mg | ORAL_TABLET | ORAL | Status: DC | PRN

## 2011-07-30 MED ORDER — PHENYLEPHRINE 40 MCG/ML (10ML) SYRINGE FOR IV PUSH (FOR BLOOD PRESSURE SUPPORT)
80.0000 ug | PREFILLED_SYRINGE | INTRAVENOUS | Status: DC | PRN
  Filled 2011-07-30: qty 5

## 2011-07-30 MED ORDER — LACTATED RINGERS IV SOLN: 500.0000 mL | INTRAVENOUS | Status: DC | PRN

## 2011-07-30 MED ORDER — FENTANYL 2.5 MCG/ML BUPIVACAINE 1/10 % EPIDURAL INFUSION (WH - ANES)
14.0000 mL/h | INTRAMUSCULAR | Status: DC
  Administered 2011-07-31: 14 mL/h via EPIDURAL
  Filled 2011-07-30 (×2): qty 60

## 2011-07-30 MED ORDER — LIDOCAINE HCL 1.5 % IJ SOLN
INTRAMUSCULAR | Status: DC | PRN
  Administered 2011-07-30 (×2): 5 mL via EPIDURAL

## 2011-07-30 MED ORDER — IBUPROFEN 600 MG PO TABS: 600.0000 mg | ORAL_TABLET | Freq: Four times a day (QID) | ORAL | Status: DC | PRN

## 2011-07-30 MED ORDER — ONDANSETRON HCL 4 MG/2ML IJ SOLN: 4.0000 mg | Freq: Four times a day (QID) | INTRAMUSCULAR | Status: DC | PRN

## 2011-07-30 NOTE — Progress Notes (Signed)
Pt G1 at 38.3wks, having contractions every 5-13min.  Pt was seen in MAU this am SVE 1.5cm.  Pt denies bleeding or leaking fluid.

## 2011-07-30 NOTE — Progress Notes (Signed)
CHANTRICE HAGG is a 20 y.o. G1P0000 at [redacted]w[redacted]d by ultrasound admitted for active labor  Subjective:   Objective: BP 130/75  Pulse 78  Temp(Src) 98.2 F (36.8 C) (Oral)  Resp 20  Ht 5\' 7"  (1.702 m)  Wt 102.967 kg (227 lb)  BMI 35.55 kg/m2  SpO2 97%      FHT:  FHR: 140 bpm, variability: moderate,  accelerations:  Present,  decelerations:  Absent UC:   regular, every 3-4 minutes SVE:   Dilation: 5 Effacement (%): 90 Station: -2 Exam by:: J BURNETTE RN   Labs: Lab Results  Component Value Date   WBC 9.8 07/30/2011   HGB 10.4* 07/30/2011   HCT 31.2* 07/30/2011   MCV 84.8 07/30/2011   PLT 204 07/30/2011    Assessment / Plan: Spontaneous labor, progressing normally  Labor: Progressing normally Preeclampsia:  no signs or symptoms of toxicity Fetal Wellbeing:  Category I Pain Control:  Epidural I/D:  n/a Anticipated MOD:  NSVD  Zerita Boers 07/30/2011, 10:21 PM

## 2011-07-30 NOTE — Anesthesia Preprocedure Evaluation (Signed)
Anesthesia Evaluation  Name, MR# and DOB Patient awake  General Assessment Comment  Reviewed: Allergy & Precautions, H&P , NPO status , Patient's Chart, lab work & pertinent test results  Airway Mallampati: II TM Distance: >3 FB Neck ROM: full    Dental No notable dental hx.    Pulmonary  clear to auscultation  pulmonary exam normalPulmonary Exam Normal breath sounds clear to auscultation none    Cardiovascular     Neuro/Psych Negative Neurological ROS  Negative Psych ROS  GI/Hepatic/Renal negative GI ROS  negative Liver ROS  negative Renal ROS        Endo/Other  Negative Endocrine ROS (+)   Morbid obesity  Abdominal (+) obese,   Musculoskeletal negative musculoskeletal ROS (+)   Hematology negative hematology ROS (+)   Peds  Reproductive/Obstetrics (+) Pregnancy    Anesthesia Other Findings             Anesthesia Physical Anesthesia Plan  ASA: III  Anesthesia Plan: Epidural   Post-op Pain Management:    Induction:   Airway Management Planned:   Additional Equipment:   Intra-op Plan:   Post-operative Plan:   Informed Consent: I have reviewed the patients History and Physical, chart, labs and discussed the procedure including the risks, benefits and alternatives for the proposed anesthesia with the patient or authorized representative who has indicated his/her understanding and acceptance.     Plan Discussed with:   Anesthesia Plan Comments:         Anesthesia Quick Evaluation

## 2011-07-30 NOTE — H&P (Signed)
Patricia [name] is a 20 y.o. female presenting for labor. Maternal Medical History:  Reason for admission: Reason for admission: contractions.  Contractions: Onset was 6-12 hours ago.   Frequency: regular.   Perceived severity is moderate.    Fetal activity: Perceived fetal activity is normal.   Last perceived fetal movement was within the past hour.      OB History    Grav Para Term Preterm Abortions TAB SAB Ect Mult Living   1 0  0           Past Medical History  Diagnosis Date  . No pertinent past medical history   . Preterm labor without delivery in second trimester June 2012    Positive FFN in June  . UTI (lower urinary tract infection)   . Bacterial vaginosis   . Chlamydia    Past Surgical History  Procedure Date  . No past surgeries    Family History: family history is not on file. Social History:  reports that she has never smoked. She has never used smokeless tobacco. She reports that she does not drink alcohol or use illicit drugs.  [name]  Constitutional: Negative.   HENT: Negative.   Eyes: Negative.   Respiratory: Negative.   Cardiovascular: Negative.   Gastrointestinal: Negative.   Genitourinary: Negative.   Musculoskeletal: Negative.   Skin: Negative.   Neurological: Negative.   Endo/Heme/Allergies: Negative.   Psychiatric/Behavioral: Negative.     Dilation: 5 Effacement (%): 90 Station: -2 Exam by:: J BURNETTE RN  Blood pressure 120/78, pulse 71, temperature 98.3 F (36.8 C), temperature source Oral, resp. rate 18, height 5\' 7"  (1.702 m), weight 102.967 kg (227 lb). Maternal Exam:  Uterine Assessment: Contraction strength is moderate.  Contraction frequency is regular.   Abdomen: Patient reports no abdominal tenderness. Fetal presentation: vertex  Introitus: Normal vulva. Normal vagina.    Physical Exam  Constitutional: She is oriented to person, place, and time. She appears well-developed and well-nourished.  HENT:  Head:  Normocephalic.  Eyes: Pupils are equal, round, and reactive to light.  Neck: Normal range of motion.  Cardiovascular: Normal rate, regular rhythm, normal heart sounds and intact distal pulses.   Respiratory: Effort normal and breath sounds normal.  GI: Soft. Bowel sounds are normal.  Genitourinary: Vagina normal and uterus normal.  Musculoskeletal: Normal range of motion.  Neurological: She is alert and oriented to person, place, and time. She has normal reflexes.  Skin: Skin is warm and dry.  Psychiatric: She has a normal mood and affect. Her behavior is normal. Judgment and thought content normal.    Prenatal labs: ABO, Rh: --/--/A NEG (05/20 2125) Antibody: Negative (02/16 0000) Rubella:   RPR: Nonreactive (02/16 0000)  HBsAg: Negative (02/16 0000)  HIV: Non-reactive (02/16 0000)  GBS:   pos  Assessment/Plan: Pos GBS. GDM diet controlled, early labor. IV antibiotics.   Zerita Boers 07/30/2011, 8:03 PM

## 2011-07-30 NOTE — Progress Notes (Signed)
Pt stated having pain with contractions.  Epidural level was checked and at T12.

## 2011-07-30 NOTE — Progress Notes (Signed)
No change in SVE ok to d/c home

## 2011-07-30 NOTE — Anesthesia Procedure Notes (Signed)
Epidural Patient location during procedure: OB Start time: 07/30/2011 10:06 PM End time: 07/30/2011 10:14 PM Reason for block: procedure for pain  Staffing Anesthesiologist: Sandrea Hughs Performed by: anesthesiologist   Preanesthetic Checklist Completed: patient identified, site marked, surgical consent, pre-op evaluation, timeout performed, IV checked, risks and benefits discussed and monitors and equipment checked  Epidural Patient position: sitting Prep: site prepped and draped and DuraPrep Patient monitoring: continuous pulse ox and blood pressure Approach: midline Injection technique: LOR air  Needle:  Needle type: Tuohy  Needle gauge: 17 G Needle length: 9 cm Needle insertion depth: 6 cm Catheter type: closed end flexible Catheter size: 19 Gauge Catheter at skin depth: 11 cm Test dose: negative and 1.5% lidocaine  Assessment Sensory level: T8 Events: blood not aspirated, injection not painful, no injection resistance, negative IV test and no paresthesia

## 2011-07-30 NOTE — Progress Notes (Signed)
Notified of SVE and ctx ok to let pt walk in hall x 1 hr and recheck cervix

## 2011-07-30 NOTE — Progress Notes (Signed)
Contractions woke up patient this morning around 4:50 now every 5 to 7 minutes G1, denies vaginal bleeding.

## 2011-07-31 ENCOUNTER — Encounter (HOSPITAL_COMMUNITY): Payer: Self-pay | Admitting: *Deleted

## 2011-07-31 DIAGNOSIS — O24419 Gestational diabetes mellitus in pregnancy, unspecified control: Secondary | ICD-10-CM

## 2011-07-31 DIAGNOSIS — Z22330 Carrier of Group B streptococcus: Secondary | ICD-10-CM

## 2011-07-31 DIAGNOSIS — O9989 Other specified diseases and conditions complicating pregnancy, childbirth and the puerperium: Secondary | ICD-10-CM

## 2011-07-31 DIAGNOSIS — O99814 Abnormal glucose complicating childbirth: Secondary | ICD-10-CM

## 2011-07-31 MED ORDER — IBUPROFEN 600 MG PO TABS
600.0000 mg | ORAL_TABLET | Freq: Four times a day (QID) | ORAL | Status: DC
  Administered 2011-07-31 – 2011-08-02 (×10): 600 mg via ORAL
  Filled 2011-07-31 (×9): qty 1

## 2011-07-31 MED ORDER — SIMETHICONE 80 MG PO CHEW: 80.0000 mg | CHEWABLE_TABLET | ORAL | Status: DC | PRN

## 2011-07-31 MED ORDER — ONDANSETRON HCL 4 MG/2ML IJ SOLN: 4.0000 mg | INTRAMUSCULAR | Status: DC | PRN

## 2011-07-31 MED ORDER — LANOLIN HYDROUS EX OINT: TOPICAL_OINTMENT | CUTANEOUS | Status: DC | PRN

## 2011-07-31 MED ORDER — BENZOCAINE-MENTHOL 20-0.5 % EX AERO
INHALATION_SPRAY | CUTANEOUS | Status: AC
  Administered 2011-07-31: 1 via TOPICAL
  Filled 2011-07-31: qty 56

## 2011-07-31 MED ORDER — DIPHENHYDRAMINE HCL 25 MG PO CAPS: 25.0000 mg | ORAL_CAPSULE | Freq: Four times a day (QID) | ORAL | Status: DC | PRN

## 2011-07-31 MED ORDER — PRENATAL PLUS 27-1 MG PO TABS: 1.0000 | ORAL_TABLET | Freq: Every day | ORAL | Status: DC

## 2011-07-31 MED ORDER — WITCH HAZEL-GLYCERIN EX PADS: 1.0000 "application " | MEDICATED_PAD | CUTANEOUS | Status: DC | PRN

## 2011-07-31 MED ORDER — OXYCODONE-ACETAMINOPHEN 5-325 MG PO TABS: 1.0000 | ORAL_TABLET | ORAL | Status: DC | PRN

## 2011-07-31 MED ORDER — SENNOSIDES-DOCUSATE SODIUM 8.6-50 MG PO TABS
2.0000 | ORAL_TABLET | Freq: Every day | ORAL | Status: DC
  Administered 2011-07-31 – 2011-08-01 (×2): 2 via ORAL

## 2011-07-31 MED ORDER — BENZOCAINE-MENTHOL 20-0.5 % EX AERO
1.0000 "application " | INHALATION_SPRAY | CUTANEOUS | Status: DC | PRN
  Administered 2011-07-31: 1 via TOPICAL

## 2011-07-31 MED ORDER — SODIUM CHLORIDE 0.9 % IV SOLN
1.0000 g | INTRAVENOUS | Status: DC
  Administered 2011-07-31: 1 g via INTRAVENOUS
  Filled 2011-07-31 (×5): qty 1000

## 2011-07-31 MED ORDER — TETANUS-DIPHTH-ACELL PERTUSSIS 5-2.5-18.5 LF-MCG/0.5 IM SUSP: 0.5000 mL | Freq: Once | INTRAMUSCULAR | Status: DC

## 2011-07-31 MED ORDER — ONDANSETRON HCL 4 MG PO TABS: 4.0000 mg | ORAL_TABLET | ORAL | Status: DC | PRN

## 2011-07-31 MED ORDER — ZOLPIDEM TARTRATE 5 MG PO TABS: 5.0000 mg | ORAL_TABLET | Freq: Every evening | ORAL | Status: DC | PRN

## 2011-07-31 MED ORDER — DIBUCAINE 1 % RE OINT: 1.0000 "application " | TOPICAL_OINTMENT | RECTAL | Status: DC | PRN

## 2011-07-31 NOTE — Anesthesia Postprocedure Evaluation (Signed)
Anesthesia Post Note  Patient: Patricia [name]  Procedure(s) Performed: * No procedures listed *  Anesthesia type: Epidural  Patient location: Mother/Baby  Post pain: Pain level controlled  Post assessment: Post-op Vital signs reviewed  Last Vitals:  Filed Vitals:   07/31/11 0501  BP: 115/74  Pulse: 64  Temp:   Resp:     Post vital signs: Reviewed  Level of consciousness: awake  Complications: No apparent anesthesia complications

## 2011-07-31 NOTE — Progress Notes (Signed)
Pt reports feeling pressure and the urge to push.

## 2011-07-31 NOTE — Anesthesia Postprocedure Evaluation (Signed)
  Anesthesia Post-op Note  Patient: Patricia [name]  Procedure(s) Performed: * No procedures listed *  Patient Location104  Anesthesia Type: Epidural  Level of Consciousness: awake, alert  and oriented  Airway and Oxygen Therapy: Patient Spontanous Breathing  Post-op Pain: none  Post-op Assessment: Post-op Vital signs reviewed, No headache, No backache, No residual numbness and No residual motor weakness  Post-op Vital Signs: Reviewed and stable  Complications: No apparent anesthesia complications

## 2011-07-31 NOTE — Addendum Note (Signed)
Addendum  created 07/31/11 0518 by Sandrea Hughs.   Modules edited:Notes Section

## 2011-07-31 NOTE — Progress Notes (Signed)
Pt complains of pain in lower abdomen; epidural level assessed at T 12

## 2011-07-31 NOTE — Progress Notes (Signed)
Encounter addended by: Karleen Dolphin on: 07/31/2011  8:28 AM<BR>     Documentation filed: Notes Section

## 2011-07-31 NOTE — Addendum Note (Signed)
Addendum  created 07/31/11 0517 by Sandrea Hughs.   Modules edited:Notes Section

## 2011-07-31 NOTE — Progress Notes (Signed)
Patricia [name] is a 20 y.o. G1P0000 at [redacted]w[redacted]d by ultrasound admitted for active labor  Subjective:   Objective: BP 111/70  Pulse 77  Temp(Src) 98.4 F (36.9 C) (Oral)  Resp 18  Ht 5\' 7"  (1.702 m)  Wt 102.967 kg (227 lb)  BMI 35.55 kg/m2  SpO2 100%      FHT:  FHR: 120 bpm, variability: moderate,  accelerations:  Present,  decelerations:  Absent UC:   regular, every 2-3 minutes SVE:   Dilation: 5 Effacement (%): 100 Station: 0 Exam by:: H.Norton, RN   Labs: Lab Results  Component Value Date   WBC 9.8 07/30/2011   HGB 10.4* 07/30/2011   HCT 31.2* 07/30/2011   MCV 84.8 07/30/2011   PLT 204 07/30/2011    Assessment / Plan: Spontaneous labor, progressing normally  Labor: Progressing normally Preeclampsia:  no signs or symptoms of toxicity Fetal Wellbeing:  Category I Pain Control:  Epidural I/D:  n/a Anticipated MOD:  NSVD  Zerita Boers 07/31/2011, 1:15 AM

## 2011-08-01 MED ORDER — RHO D IMMUNE GLOBULIN 1500 UNIT/2ML IJ SOLN
300.0000 ug | Freq: Once | INTRAMUSCULAR | Status: AC
  Administered 2011-08-01: 300 ug via INTRAMUSCULAR
  Filled 2011-08-01: qty 2

## 2011-08-01 NOTE — Progress Notes (Signed)
Post Partum Day 1 Subjective: no complaints, up ad lib, voiding and tolerating PO  Objective: Blood pressure 99/65, pulse 84, temperature 97.9 F (36.6 C), temperature source Oral, resp. rate 18, height 5\' 7"  (1.702 m), weight 102.967 kg (227 lb), SpO2 100.00%, unknown if currently breastfeeding.  Physical Exam:  General: alert, cooperative, appears stated age and no distress Lochia: appropriate Uterine Fundus: firm Incision: n/a DVT Evaluation: No evidence of DVT seen on physical exam. Negative Homan's sign. No cords or calf tenderness. No significant calf/ankle edema.   Basename 07/30/11 2030  HGB 10.4*  HCT 31.2*    Assessment/Plan: Plan for discharge tomorrow   LOS: 2 days   Patricia [name] 08/01/2011, 6:42 AM

## 2011-08-01 NOTE — Progress Notes (Signed)
UR chart review completed.  

## 2011-08-02 LAB — RH IG WORKUP (INCLUDES ABO/RH)
DAT, IgG: NEGATIVE
Fetal Screen: NEGATIVE
Gestational Age(Wks): 38.4

## 2011-08-02 MED ORDER — ACETAMINOPHEN 500 MG PO TABS: 500.0000 mg | ORAL_TABLET | Freq: Four times a day (QID) | ORAL | Status: DC | PRN

## 2011-08-02 MED ORDER — LANOLIN HYDROUS EX OINT: 1.0000 "application " | TOPICAL_OINTMENT | CUTANEOUS | Status: DC | PRN

## 2011-08-02 NOTE — Discharge Summary (Signed)
  Obstetric Discharge Summary Reason for Admission: onset of labor Prenatal Procedures: none Intrapartum Procedures: spontaneous vaginal delivery Postpartum Procedures: none Complications-Operative and Postpartum: none Hemoglobin  Date Value Range Status  07/30/2011 10.4* 12.0-15.0 (g/dL) Final     HCT  Date Value Range Status  07/30/2011 31.2* 36.0-46.0 (%) Final    Discharge Diagnoses: Term Pregnancy-delivered  Discharge Information: Date: 08/02/2011 Activity: unrestricted and pelvic rest Diet: routine Medications: PNV and Tylenol Condition: stable Instructions: refer to practice specific booklet Discharge to: home Follow-up Information    Follow up with KNOWLTON,STEPHEN D. Call in 2 days. (and in 6 weeks)    Contact information:   [address] [place] [address] [phone]          Newborn Data: Live born female  Birth Weight: 8 lb (3630 g) APGAR: 9, 9  Home with mother.  Patricia [name] 08/02/2011, 8:07 AM

## 2011-08-02 NOTE — Progress Notes (Signed)
  Subjective:    Patient ID: Patricia [name], female    DOB: [birth year], 20 y.o.   MRN: [id number]  HPI    [name]     Objective:   Physical Exam        Assessment & Plan:   Post Partum Day 2 Subjective: no complaints  Objective: Blood pressure 101/65, pulse 62, temperature 98 F (36.7 C), temperature source Oral, resp. rate 18, height 5\' 7"  (1.702 m), weight 227 lb (102.967 kg), SpO2 100.00%, unknown if currently breastfeeding.  Physical Exam:  General: alert, cooperative and no distress Lochia: appropriate Uterine Fundus: firm Incision: N/A DVT Evaluation: No evidence of DVT seen on physical exam. No significant calf/ankle edema.   Basename 07/30/11 2030  HGB 10.4*  HCT 31.2*    Assessment/Plan: Discharge home, Breastfeeding and Contraception Nexplanon at 6 week f/u. May get depo earlier.    LOS: 3 days   Patricia [name] 08/02/2011, 7:58 AM

## 2011-10-16 ENCOUNTER — Emergency Department (HOSPITAL_COMMUNITY)
Admission: EM | Admit: 2011-10-16 | Discharge: 2011-10-17 | Disposition: A | Discharge status: Home / Self Care | Payer: Medicaid Other | Attending: Emergency Medicine | Admitting: Emergency Medicine

## 2011-10-16 ENCOUNTER — Encounter (HOSPITAL_COMMUNITY): Payer: Self-pay

## 2011-10-16 DIAGNOSIS — N898 Other specified noninflammatory disorders of vagina: Secondary | ICD-10-CM | POA: Insufficient documentation

## 2011-10-16 DIAGNOSIS — R112 Nausea with vomiting, unspecified: Secondary | ICD-10-CM | POA: Insufficient documentation

## 2011-10-16 DIAGNOSIS — N939 Abnormal uterine and vaginal bleeding, unspecified: Secondary | ICD-10-CM

## 2011-10-16 DIAGNOSIS — R109 Unspecified abdominal pain: Secondary | ICD-10-CM | POA: Insufficient documentation

## 2011-10-16 DIAGNOSIS — N289 Disorder of kidney and ureter, unspecified: Secondary | ICD-10-CM | POA: Insufficient documentation

## 2011-10-16 LAB — URINALYSIS, ROUTINE W REFLEX MICROSCOPIC
Glucose, UA: NEGATIVE mg/dL
Hgb urine dipstick: NEGATIVE
Leukocytes, UA: NEGATIVE
pH: 6.5 (ref 5.0–8.0)

## 2011-10-16 LAB — PREGNANCY, URINE: Preg Test, Ur: NEGATIVE

## 2011-10-16 NOTE — ED Provider Notes (Signed)
History     CSN: [id number] Arrival date & time: 10/16/2011 10:46 PM   First MD Initiated Contact with Patient 10/16/11 2259      Chief Complaint  Patient presents with  . Abdominal Cramping  . Vaginal Bleeding    (Consider location/radiation/quality/duration/timing/severity/associated sxs/prior treatment) HPI Comments: Pt is ~ 2 months postpartum.  Dr. Recently inserted implenon contraceptive.  States she is not supposed to have any vaginal bleeding.  She has variable bleeding for the past 10 days.  Denies UTI sxs.  + nausea and 2 episodes of vomiting ~ 1-2 weeks ago.  No fever or chills.  Patient is a 20 y.o. female presenting with cramps and vaginal bleeding. The history is provided by the patient. No language interpreter was used.  Abdominal Cramping The primary symptoms of the illness include abdominal pain, nausea, vomiting and vaginal bleeding. The primary symptoms of the illness do not include fever, diarrhea or vaginal discharge. The onset of the illness was gradual.  The nausea is associated with her menstrual cycle.  Symptoms associated with the illness do not include chills, anorexia, diaphoresis, constipation, urgency, hematuria, frequency or back pain.  Vaginal Bleeding Associated symptoms include abdominal pain, nausea and vomiting. Pertinent negatives include no anorexia, chills, diaphoresis or fever.    Past Medical History  Diagnosis Date  . No pertinent past medical history   . Preterm labor without delivery in second trimester June 2012    Positive FFN in June  . UTI (lower urinary tract infection)   . Bacterial vaginosis   . Chlamydia     Past Surgical History  Procedure Date  . No past surgeries     History reviewed. No pertinent family history.  History  Substance Use Topics  . Smoking status: Never Smoker   . Smokeless tobacco: Never Used  . Alcohol Use: No    OB History    Grav Para Term Preterm Abortions TAB SAB Ect Mult Living   1 1 1  0       1      [name]  Constitutional: Negative for fever, chills and diaphoresis.  Gastrointestinal: Positive for nausea, vomiting and abdominal pain. Negative for diarrhea, constipation and anorexia.  Genitourinary: Positive for vaginal bleeding. Negative for urgency, frequency, hematuria, vaginal discharge and vaginal pain.  Musculoskeletal: Negative for back pain.  All other systems reviewed and are negative.    Allergies  Review of patient's allergies indicates no known allergies.  Home Medications   Current Outpatient Rx  Name Route Sig Dispense Refill  . ACETAMINOPHEN 500 MG PO TABS Oral Take 1 tablet (500 mg total) by mouth every 6 (six) hours as needed. For pain 30 tablet   . LANOLIN HYDROUS EX OINT Topical Apply 1 application topically as needed (for breast care).    Marland Kitchen PRENATAL PLUS 27-1 MG PO TABS Oral Take 1 tablet by mouth daily.       BP 127/74  Pulse 78  Temp(Src) 98.6 F (37 C) (Oral)  Resp 20  Ht 5\' 7"  (1.702 m)  Wt 217 lb (98.431 kg)  BMI 33.99 kg/m2  SpO2 100%  LMP 10/06/2011  Breastfeeding? No  Physical Exam  Nursing note and vitals reviewed. Constitutional: She is oriented to person, place, and time. She appears well-developed and well-nourished. No distress.  HENT:  Head: Normocephalic and atraumatic.  Eyes: EOM are normal.  Neck: Normal range of motion.  Cardiovascular: Normal rate, regular rhythm and normal heart sounds.   Pulmonary/Chest: Effort normal  and breath sounds normal.  Abdominal: Soft. She exhibits no distension and no mass. There is no hepatosplenomegaly. There is tenderness. There is no rebound, no guarding and no CVA tenderness.    Genitourinary: There is tenderness around the vagina. No vaginal discharge found.       Minimal bleeding noted on speculum exam.  Bimanual reveals tenderness over B adnexa.  Musculoskeletal: Normal range of motion.  Neurological: She is alert and oriented to person, place, and time.  Skin:  Skin is warm and dry.  Psychiatric: She has a normal mood and affect. Judgment normal.    ED Course  Procedures (including critical care time)   Labs Reviewed  CBC  DIFFERENTIAL  BASIC METABOLIC PANEL  URINALYSIS, ROUTINE W REFLEX MICROSCOPIC  PREGNANCY, URINE   No results found.   No diagnosis found.    MDM          Worthy Rancher, PA 10/17/11 [phone]

## 2011-10-16 NOTE — ED Notes (Signed)
Pt states she has had vaginal bleeding off and on for 1 and 1/2 weeks with intermittent nausea and vomiting. Pt recently had birth control implant placed. Pt also c/o general abd pain

## 2011-10-16 NOTE — ED Notes (Signed)
Pt presents with abdominal cramping and vaginal bleeding x 1 1/2 weeks. Pt states she is going through 3 pads an hour. Pt also states she has had n/v.

## 2011-10-17 LAB — DIFFERENTIAL
Basophils Absolute: 0 10*3/uL (ref 0.0–0.1)
Lymphocytes Relative: 43 % (ref 12–46)
Lymphs Abs: 2.6 10*3/uL (ref 0.7–4.0)
Monocytes Absolute: 0.6 10*3/uL (ref 0.1–1.0)
Neutro Abs: 2.6 10*3/uL (ref 1.7–7.7)

## 2011-10-17 LAB — BASIC METABOLIC PANEL
CO2: 26 mEq/L (ref 19–32)
Chloride: 105 mEq/L (ref 96–112)
Creatinine, Ser: 1.42 mg/dL — ABNORMAL HIGH (ref 0.50–1.10)
Glucose, Bld: 95 mg/dL (ref 70–99)
Sodium: 137 mEq/L (ref 135–145)

## 2011-10-17 LAB — CBC
HCT: 32.9 % — ABNORMAL LOW (ref 36.0–46.0)
Hemoglobin: 10.4 g/dL — ABNORMAL LOW (ref 12.0–15.0)
MCV: 84.4 fL (ref 78.0–100.0)
RBC: 3.9 MIL/uL (ref 3.87–5.11)
RDW: 13.4 % (ref 11.5–15.5)
WBC: 6 10*3/uL (ref 4.0–10.5)

## 2011-10-17 NOTE — ED Provider Notes (Signed)
Medical screening examination/treatment/procedure(s) were performed by non-physician practitioner and as supervising physician I was immediately available for consultation/collaboration.  Maliik Karner R Ashten Sarnowski, MD 10/17/11 0217 

## 2011-10-17 NOTE — ED Notes (Signed)
Pt ambulatory at discharge with no c/o

## 2012-02-11 ENCOUNTER — Encounter (HOSPITAL_COMMUNITY): Payer: Self-pay | Admitting: Emergency Medicine

## 2012-02-11 ENCOUNTER — Emergency Department (HOSPITAL_COMMUNITY)
Admission: EM | Admit: 2012-02-11 | Discharge: 2012-02-11 | Disposition: A | Discharge status: Home / Self Care | Payer: Medicaid Other | Attending: Emergency Medicine | Admitting: Emergency Medicine

## 2012-02-11 DIAGNOSIS — K5289 Other specified noninfective gastroenteritis and colitis: Secondary | ICD-10-CM | POA: Insufficient documentation

## 2012-02-11 DIAGNOSIS — R10819 Abdominal tenderness, unspecified site: Secondary | ICD-10-CM | POA: Insufficient documentation

## 2012-02-11 DIAGNOSIS — K529 Noninfective gastroenteritis and colitis, unspecified: Secondary | ICD-10-CM

## 2012-02-11 DIAGNOSIS — R112 Nausea with vomiting, unspecified: Secondary | ICD-10-CM | POA: Insufficient documentation

## 2012-02-11 LAB — CBC
Platelets: 199 10*3/uL (ref 150–400)
RDW: 13.9 % (ref 11.5–15.5)
WBC: 4.1 10*3/uL (ref 4.0–10.5)

## 2012-02-11 LAB — COMPREHENSIVE METABOLIC PANEL
ALT: 15 U/L (ref 0–35)
AST: 17 U/L (ref 0–37)
CO2: 26 mEq/L (ref 19–32)
Calcium: 9.1 mg/dL (ref 8.4–10.5)
Chloride: 104 mEq/L (ref 96–112)
GFR calc non Af Amer: 82 mL/min — ABNORMAL LOW (ref >90)
Sodium: 137 mEq/L (ref 135–145)

## 2012-02-11 LAB — DIFFERENTIAL
Basophils Absolute: 0 10*3/uL (ref 0.0–0.1)
Eosinophils Relative: 0 % (ref 0–5)
Lymphocytes Relative: 18 % (ref 12–46)
Neutro Abs: 3.1 10*3/uL (ref 1.7–7.7)
Neutrophils Relative %: 76 % (ref 43–77)

## 2012-02-11 LAB — URINALYSIS, ROUTINE W REFLEX MICROSCOPIC
Leukocytes, UA: NEGATIVE
Nitrite: NEGATIVE
Protein, ur: NEGATIVE mg/dL
Urobilinogen, UA: 0.2 mg/dL (ref 0.0–1.0)

## 2012-02-11 MED ORDER — SODIUM CHLORIDE 0.9 % IV BOLUS (SEPSIS)
1000.0000 mL | Freq: Once | INTRAVENOUS | Status: AC
  Administered 2012-02-11: 1000 mL via INTRAVENOUS

## 2012-02-11 MED ORDER — KETOROLAC TROMETHAMINE 30 MG/ML IJ SOLN
30.0000 mg | Freq: Once | INTRAMUSCULAR | Status: AC
  Administered 2012-02-11: 30 mg via INTRAVENOUS
  Filled 2012-02-11: qty 1

## 2012-02-11 MED ORDER — PROMETHAZINE HCL 25 MG PO TABS: 25.0000 mg | ORAL_TABLET | Freq: Four times a day (QID) | ORAL | Status: DC | PRN

## 2012-02-11 NOTE — ED Provider Notes (Cosign Needed)
This chart was scribed for Patricia Lennert, MD by Wallis Mart. The patient was seen in room APA09/APA09 and the patient's care was started at 11:13 AM.   CSN: [id number]  Arrival date & time 02/11/12  1008   First MD Initiated Contact with Patient 02/11/12 1111      Chief Complaint  Patient presents with  . Emesis  . Abdominal Pain    (Consider location/radiation/quality/duration/timing/severity/associated sxs/prior treatment) Patient is a 21 y.o. female presenting with vomiting and abdominal pain. The history is provided by the patient. No language interpreter was used.  Emesis  This is a new problem. The current episode started 6 to 12 hours ago. The problem occurs 2 to 4 times per day. The problem has been gradually worsening. The emesis has an appearance of stomach contents. There has been no fever. Associated symptoms include abdominal pain. Pertinent negatives include no chills, no cough, no diarrhea, no headaches and no myalgias.  Abdominal Pain The primary symptoms of the illness include abdominal pain, nausea and vomiting. The primary symptoms of the illness do not include fatigue or diarrhea. The current episode started 6 to 12 hours ago. The onset of the illness was sudden. The problem has been gradually worsening.  Symptoms associated with the illness do not include chills, hematuria, frequency or back pain.   Patricia [name] is a 21 y.o. female who presents to the Emergency Department complaining of sudden onset, waxing and waning, moderate abd pain onset 4 AM this morning.Pt states that the pain started while she was at work.  Pain radiates to back when standing. Pt c/o associated nausea and vomiting but denies diarrhea, dysuria. There are no other associated symptoms and no other alleviating or aggravating factors.  Pt denies any abd surgeries. Pt says there is a virus that is going around at her job.  Past Medical History  Diagnosis Date  . No pertinent past medical  history   . Preterm labor without delivery in second trimester June 2012    Positive FFN in June  . UTI (lower urinary tract infection)   . Bacterial vaginosis   . Chlamydia     Past Surgical History  Procedure Date  . No past surgeries     No family history on file.  History  Substance Use Topics  . Smoking status: Never Smoker   . Smokeless tobacco: Never Used  . Alcohol Use: No    OB History    Grav Para Term Preterm Abortions TAB SAB Ect Mult Living   1 1 1  0      1      [name]  Constitutional: Negative for chills and fatigue.  HENT: Negative for congestion, sinus pressure and ear discharge.   Eyes: Negative for discharge and itching.  Respiratory: Negative for cough and wheezing.   Cardiovascular: Negative for chest pain and palpitations.  Gastrointestinal: Positive for nausea, vomiting and abdominal pain. Negative for diarrhea.  Genitourinary: Negative for frequency and hematuria.  Musculoskeletal: Negative for myalgias and back pain.  Skin: Negative for pallor and rash.  Neurological: Negative for seizures and headaches.  Hematological: Negative.   Psychiatric/Behavioral: Negative for hallucinations.    Allergies  Review of patient's allergies indicates no known allergies.  Home Medications   Current Outpatient Rx  Name Route Sig Dispense Refill  . ACETAMINOPHEN 500 MG PO TABS Oral Take 1 tablet (500 mg total) by mouth every 6 (six) hours as needed. For pain 30 tablet   .  LANOLIN HYDROUS EX OINT Topical Apply 1 application topically as needed (for breast care).    Marland Kitchen PRENATAL PLUS 27-1 MG PO TABS Oral Take 1 tablet by mouth daily.       BP 107/73  Pulse 96  Temp 98.9 F (37.2 C)  Resp 18  Ht 5\' 7"  (1.702 m)  Wt 228 lb (103.42 kg)  BMI 35.71 kg/m2  SpO2 100%  LMP 01/11/2012  Physical Exam  Constitutional: She is oriented to person, place, and time. She appears well-developed.  HENT:  Head: Normocephalic and atraumatic.  Eyes:  Conjunctivae and EOM are normal. No scleral icterus.  Neck: Neck supple. No thyromegaly present.  Cardiovascular: Normal rate and regular rhythm.  Exam reveals no gallop and no friction rub.   No murmur heard. Pulmonary/Chest: No stridor. She has no wheezes. She has no rales. She exhibits no tenderness.  Abdominal: She exhibits no distension. There is tenderness. There is no rebound.       Mild tenderness LLQ, RLQ, superpubic  Musculoskeletal: Normal range of motion. She exhibits no edema.  Lymphadenopathy:    She has no cervical adenopathy.  Neurological: She is oriented to person, place, and time. Coordination normal.  Skin: No rash noted. No erythema.  Psychiatric: She has a normal mood and affect. Her behavior is normal.    ED Course  Procedures (including critical care time) DIAGNOSTIC STUDIES: Oxygen Saturation is 100% on room air, normal by my interpretation.    COORDINATION OF CARE:  1:54 PM: EDP at bedside. All results reviewed and discussed with pt, questions answered, pt agreeable with plan.  Pt to be discharged.    Labs Reviewed  COMPREHENSIVE METABOLIC PANEL - Abnormal; Notable for the following:    GFR calc non Af Amer 82 (*)    All other components within normal limits  PREGNANCY, URINE  URINALYSIS, ROUTINE W REFLEX MICROSCOPIC  CBC  DIFFERENTIAL   No results found.   No diagnosis found.  Pt improved with tx  MDM   gastroenteritis   The chart was scribed for me under my direct supervision.  I personally performed the history, physical, and medical decision making and all procedures in the evaluation of this patient..   The chart was scribed for me under my direct supervision.  I personally performed the history, physical, and medical decision making and all procedures in the evaluation of this patient.Patricia Lennert, MD 02/11/12 1400  Patricia Lennert, MD 02/15/12 1135

## 2012-02-11 NOTE — ED Notes (Signed)
Pt c/o n/v/lower abd pain since 0400 today

## 2012-02-11 NOTE — Discharge Instructions (Signed)
Follow up in 2 days if not improving.  Drink plenty of fluids. °

## 2012-02-15 ENCOUNTER — Other Ambulatory Visit: Payer: Self-pay

## 2012-02-15 ENCOUNTER — Encounter (HOSPITAL_COMMUNITY): Payer: Self-pay

## 2012-02-15 ENCOUNTER — Emergency Department (HOSPITAL_COMMUNITY)
Admission: EM | Admit: 2012-02-15 | Discharge: 2012-02-15 | Disposition: A | Discharge status: Home / Self Care | Payer: Medicaid Other | Attending: Emergency Medicine | Admitting: Emergency Medicine

## 2012-02-15 ENCOUNTER — Emergency Department (HOSPITAL_COMMUNITY): Payer: Medicaid Other

## 2012-02-15 DIAGNOSIS — R112 Nausea with vomiting, unspecified: Secondary | ICD-10-CM | POA: Insufficient documentation

## 2012-02-15 DIAGNOSIS — R079 Chest pain, unspecified: Secondary | ICD-10-CM | POA: Insufficient documentation

## 2012-02-15 DIAGNOSIS — R109 Unspecified abdominal pain: Secondary | ICD-10-CM | POA: Insufficient documentation

## 2012-02-15 LAB — DIFFERENTIAL
Basophils Relative: 0 % (ref 0–1)
Eosinophils Absolute: 0.1 10*3/uL (ref 0.0–0.7)
Eosinophils Relative: 1 % (ref 0–5)
Monocytes Absolute: 0.4 10*3/uL (ref 0.1–1.0)
Monocytes Relative: 6 % (ref 3–12)
Neutrophils Relative %: 60 % (ref 43–77)

## 2012-02-15 LAB — COMPREHENSIVE METABOLIC PANEL
Albumin: 3.9 g/dL (ref 3.5–5.2)
BUN: 12 mg/dL (ref 6–23)
Calcium: 9.4 mg/dL (ref 8.4–10.5)
Creatinine, Ser: 1.09 mg/dL (ref 0.50–1.10)
Total Protein: 7.5 g/dL (ref 6.0–8.3)

## 2012-02-15 LAB — URINALYSIS, ROUTINE W REFLEX MICROSCOPIC
Glucose, UA: NEGATIVE mg/dL
Leukocytes, UA: NEGATIVE
Nitrite: NEGATIVE
pH: 6 (ref 5.0–8.0)

## 2012-02-15 LAB — CBC
Hemoglobin: 12 g/dL (ref 12.0–15.0)
MCH: 26.5 pg (ref 26.0–34.0)
MCHC: 32.3 g/dL (ref 30.0–36.0)

## 2012-02-15 LAB — LIPASE, BLOOD: Lipase: 52 U/L (ref 11–59)

## 2012-02-15 LAB — PREGNANCY, URINE: Preg Test, Ur: NEGATIVE

## 2012-02-15 MED ORDER — SODIUM CHLORIDE 0.9 % IV SOLN
1000.0000 mL | INTRAVENOUS | Status: DC
  Administered 2012-02-15: 1000 mL via INTRAVENOUS

## 2012-02-15 MED ORDER — OMEPRAZOLE MAGNESIUM 20 MG PO TBEC: 20.0000 mg | DELAYED_RELEASE_TABLET | Freq: Every day | ORAL | Status: DC

## 2012-02-15 MED ORDER — PROMETHAZINE HCL 25 MG PO TABS: 25.0000 mg | ORAL_TABLET | Freq: Four times a day (QID) | ORAL | Status: DC | PRN

## 2012-02-15 MED ORDER — MORPHINE SULFATE 4 MG/ML IJ SOLN
4.0000 mg | Freq: Once | INTRAMUSCULAR | Status: AC
Start: 2012-02-15 — End: 2012-02-15
  Administered 2012-02-15: 4 mg via INTRAVENOUS
  Filled 2012-02-15: qty 1

## 2012-02-15 MED ORDER — ONDANSETRON HCL 4 MG/2ML IJ SOLN
4.0000 mg | Freq: Once | INTRAMUSCULAR | Status: AC
  Administered 2012-02-15: 4 mg via INTRAVENOUS
  Filled 2012-02-15: qty 2

## 2012-02-15 MED ORDER — GI COCKTAIL ~~LOC~~
30.0000 mL | Freq: Once | ORAL | Status: AC
  Administered 2012-02-15: 30 mL via ORAL
  Filled 2012-02-15: qty 30

## 2012-02-15 MED ORDER — SODIUM CHLORIDE 0.9 % IV SOLN
1000.0000 mL | Freq: Once | INTRAVENOUS | Status: AC
  Administered 2012-02-15: 1000 mL via INTRAVENOUS

## 2012-02-15 NOTE — Discharge Instructions (Signed)
Abdominal Pain Abdominal pain can be caused by many things. Your caregiver decides the seriousness of your pain by an examination and possibly blood tests and X-rays. Many cases can be observed and treated at home. Most abdominal pain is not caused by a disease and will probably improve without treatment. However, in many cases, more time must pass before a clear cause of the pain can be found. Before that point, it may not be known if you need more testing, or if hospitalization or surgery is needed. HOME CARE INSTRUCTIONS   Do not take laxatives unless directed by your caregiver.   Take pain medicine only as directed by your caregiver.   Only take over-the-counter or prescription medicines for pain, discomfort, or fever as directed by your caregiver.   Try a clear liquid diet (broth, tea, or water) for as long as directed by your caregiver. Slowly move to a bland diet as tolerated.  SEEK IMMEDIATE MEDICAL CARE IF:   The pain does not go away.   You have a fever.   You keep throwing up (vomiting).   The pain is felt only in portions of the abdomen. Pain in the right side could possibly be appendicitis. In an adult, pain in the left lower portion of the abdomen could be colitis or diverticulitis.   You pass bloody or black tarry stools.  MAKE SURE YOU:   Understand these instructions.   Will watch your condition.   Will get help right away if you are not doing well or get worse.  Document Released: 08/23/2005 Document Revised: 11/02/2011 Document Reviewed: 07/01/2008 [employer] Patient Information 2012 [employer].Abdominal Pain Abdominal pain can be caused by many things. Your caregiver decides the seriousness of your pain by an examination and possibly blood tests and X-rays. Many cases can be observed and treated at home. Most abdominal pain is not caused by a disease and will probably improve without treatment. However, in many cases, more time must pass before a clear cause of  the pain can be found. Before that point, it may not be known if you need more testing, or if hospitalization or surgery is needed. HOME CARE INSTRUCTIONS   Do not take laxatives unless directed by your caregiver.   Take pain medicine only as directed by your caregiver.   Only take over-the-counter or prescription medicines for pain, discomfort, or fever as directed by your caregiver.   Try a clear liquid diet (broth, tea, or water) for as long as directed by your caregiver. Slowly move to a bland diet as tolerated.  SEEK IMMEDIATE MEDICAL CARE IF:   The pain does not go away.   You have a fever.   You keep throwing up (vomiting).   The pain is felt only in portions of the abdomen. Pain in the right side could possibly be appendicitis. In an adult, pain in the left lower portion of the abdomen could be colitis or diverticulitis.   You pass bloody or black tarry stools.  MAKE SURE YOU:   Understand these instructions.   Will watch your condition.   Will get help right away if you are not doing well or get worse.  Document Released: 08/23/2005 Document Revised: 11/02/2011 Document Reviewed: 07/01/2008 [employer] Patient Information 2012 [employer]. 

## 2012-02-15 NOTE — ED Notes (Signed)
Pt seen here Sunday for vomiting and abd pain, tonight with burning pain up into chest.

## 2012-02-15 NOTE — ED Provider Notes (Signed)
History     CSN: [id number]  Arrival date & time 02/15/12  0010   First MD Initiated Contact with Patient 02/15/12 0031      Chief Complaint  Patient presents with  . Abdominal Pain  . Pleurisy    HPI The patient states her symptoms initially started on Sunday. At that time she was having nausea vomiting and abdominal pain. Patient was evaluated and released. She was given a prescription for Phenergan and actually had been feeling pretty well.  Patient went back to work today and on her break she started developing some burning-type discomfort in her chest. she was able to eat a biscuit with jelly the symptoms seemed to get worse.  Patient finished her work and went home but this evening the pain returned.  She describes it as a burning discomfort that goes up towards her chest. She does have abdominal cramping associated with this. She has not had any nausea, vomiting, fever, diarrhea or constipation.  She denies any cough. No recent trips or travel. She has not tried any medications at home. She has not noticed any worsening with certain positions. Past Medical History  Diagnosis Date  . No pertinent past medical history   . Preterm labor without delivery in second trimester June 2012    Positive FFN in June  . UTI (lower urinary tract infection)   . Bacterial vaginosis   . Chlamydia     Past Surgical History  Procedure Date  . No past surgeries     No family history on file.  History  Substance Use Topics  . Smoking status: Never Smoker   . Smokeless tobacco: Never Used  . Alcohol Use: No    OB History    Grav Para Term Preterm Abortions TAB SAB Ect Mult Living   1 1 1  0      1      [name]  All other systems reviewed and are negative.    Allergies  Review of patient's allergies indicates no known allergies.  Home Medications   Current Outpatient Rx  Name Route Sig Dispense Refill  . PROMETHAZINE HCL 25 MG PO TABS Oral Take 1 tablet (25 mg total)  by mouth every 6 (six) hours as needed for nausea. 15 tablet 0    BP 90/51  Pulse 102  Temp(Src) 98.3 F (36.8 C) (Oral)  Resp 16  Ht 5\' 7"  (1.702 m)  Wt 228 lb (103.42 kg)  BMI 35.71 kg/m2  SpO2 99%  LMP 01/11/2012  Physical Exam  Nursing note and vitals reviewed. Constitutional: She appears well-developed and well-nourished. No distress.  HENT:  Head: Normocephalic and atraumatic.  Right Ear: External ear normal.  Left Ear: External ear normal.  Eyes: Conjunctivae are normal. Right eye exhibits no discharge. Left eye exhibits no discharge. No scleral icterus.  Neck: Neck supple. No tracheal deviation present.  Cardiovascular: Normal rate, regular rhythm and intact distal pulses.   Pulmonary/Chest: Effort normal and breath sounds normal. No stridor. No respiratory distress. She has no wheezes. She has no rales.  Abdominal: Soft. Bowel sounds are normal. She exhibits no distension. There is tenderness (epigastric and right upper quadrant). There is no rebound and no guarding.  Musculoskeletal: She exhibits no edema and no tenderness.  Neurological: She is alert. She has normal strength. No sensory deficit. Cranial nerve deficit:  no gross defecits noted. She exhibits normal muscle tone. She displays no seizure activity. Coordination normal.  Skin: Skin is warm and  dry. No rash noted.  Psychiatric: She has a normal mood and affect.    ED Course  Procedures (including critical care time)  Date: 02/15/2012  Rate: 75  Rhythm: normal sinus rhythm  QRS Axis: normal  Intervals: normal  ST/T Wave abnormalities: normal  Conduction Disutrbances:none  Narrative Interpretation:   Old EKG Reviewed: none available   Labs Reviewed  COMPREHENSIVE METABOLIC PANEL - Abnormal; Notable for the following:    Glucose, Bld 105 (*)    Total Bilirubin 0.2 (*)    GFR calc non Af Amer 73 (*)    GFR calc Af Amer 85 (*)    All other components within normal limits  CBC  DIFFERENTIAL    URINALYSIS, ROUTINE W REFLEX MICROSCOPIC  LIPASE, BLOOD  PREGNANCY, URINE   Dg Chest 2 View  02/15/2012  *RADIOLOGY REPORT*  Clinical Data: Chest pain and upper abdominal pain.  CHEST - 2 VIEW  Comparison: None.  Findings: The lungs are well-aerated and clear.  There is no evidence of focal opacification, pleural effusion or pneumothorax.  The heart is normal in size; the mediastinal contour is within normal limits.  No acute osseous abnormalities are seen.  IMPRESSION: No acute cardiopulmonary process seen.  Original Report Authenticated By: Tonia Ghent, M.D.     MDM  The patient is feeling better after treatment. Her lab tests and x-rays are reassuring. I doubt cardiac or pulmonary allergy. I suspect her symptoms are most likely related to esophagitis. No discharge her home on a course of antacids. I did discuss with the patient the possibility of biliary colic as well. At this time however, I have a lower suspicion for that. She has not noticed any specific foods that have been triggering it. I did discuss with her returning to the emergency department for  further evaluation if the symptoms persist or worsen.        Celene Kras, MD 02/15/12 [phone]

## 2012-02-15 NOTE — ED Notes (Signed)
Discharge instructions reviewed with pt, questions answered. Pt verbalized understanding.  

## 2012-02-27 ENCOUNTER — Encounter (HOSPITAL_COMMUNITY): Payer: Self-pay | Admitting: Emergency Medicine

## 2012-02-27 ENCOUNTER — Emergency Department (HOSPITAL_COMMUNITY)
Admission: EM | Admit: 2012-02-27 | Discharge: 2012-02-27 | Disposition: A | Discharge status: Home / Self Care | Payer: Medicaid Other | Attending: Emergency Medicine | Admitting: Emergency Medicine

## 2012-02-27 DIAGNOSIS — R51 Headache: Secondary | ICD-10-CM | POA: Insufficient documentation

## 2012-02-27 DIAGNOSIS — E860 Dehydration: Secondary | ICD-10-CM

## 2012-02-27 DIAGNOSIS — A084 Viral intestinal infection, unspecified: Secondary | ICD-10-CM

## 2012-02-27 DIAGNOSIS — A088 Other specified intestinal infections: Secondary | ICD-10-CM | POA: Insufficient documentation

## 2012-02-27 LAB — URINALYSIS, ROUTINE W REFLEX MICROSCOPIC
Leukocytes, UA: NEGATIVE
Nitrite: NEGATIVE
Specific Gravity, Urine: >1.03 — ABNORMAL HIGH (ref 1.005–1.030)
Urobilinogen, UA: 0.2 mg/dL (ref 0.0–1.0)
pH: 6 (ref 5.0–8.0)

## 2012-02-27 LAB — POCT PREGNANCY, URINE: Preg Test, Ur: NEGATIVE

## 2012-02-27 MED ORDER — FAMOTIDINE 20 MG PO TABS: 20.0000 mg | ORAL_TABLET | Freq: Two times a day (BID) | ORAL | Status: DC | PRN

## 2012-02-27 MED ORDER — KETOROLAC TROMETHAMINE 30 MG/ML IJ SOLN
30.0000 mg | Freq: Once | INTRAMUSCULAR | Status: AC
  Administered 2012-02-27: 30 mg via INTRAVENOUS
  Filled 2012-02-27: qty 1

## 2012-02-27 MED ORDER — SODIUM CHLORIDE 0.9 % IV BOLUS (SEPSIS)
1000.0000 mL | Freq: Once | INTRAVENOUS | Status: AC
  Administered 2012-02-27: 1000 mL via INTRAVENOUS

## 2012-02-27 MED ORDER — DIPHENHYDRAMINE HCL 25 MG PO CAPS
25.0000 mg | ORAL_CAPSULE | Freq: Once | ORAL | Status: AC
  Administered 2012-02-27: 25 mg via ORAL
  Filled 2012-02-27: qty 1

## 2012-02-27 MED ORDER — FAMOTIDINE IN NACL 20-0.9 MG/50ML-% IV SOLN
20.0000 mg | Freq: Once | INTRAVENOUS | Status: AC
  Administered 2012-02-27: 20 mg via INTRAVENOUS
  Filled 2012-02-27: qty 50

## 2012-02-27 MED ORDER — IBUPROFEN 800 MG PO TABS: 800.0000 mg | ORAL_TABLET | Freq: Three times a day (TID) | ORAL | Status: AC | PRN

## 2012-02-27 MED ORDER — ONDANSETRON HCL 8 MG PO TABS: 8.0000 mg | ORAL_TABLET | Freq: Three times a day (TID) | ORAL | Status: AC | PRN

## 2012-02-27 MED ORDER — PROMETHAZINE HCL 25 MG/ML IJ SOLN
25.0000 mg | Freq: Once | INTRAMUSCULAR | Status: AC
  Administered 2012-02-27: 25 mg via INTRAVENOUS
  Filled 2012-02-27: qty 1

## 2012-02-27 MED ORDER — SODIUM CHLORIDE 0.9 % IV SOLN
INTRAVENOUS | Status: DC
  Administered 2012-02-27: 21:00:00 via INTRAVENOUS

## 2012-02-27 NOTE — ED Notes (Signed)
Pt c/o ha/n/v.

## 2012-02-27 NOTE — ED Provider Notes (Signed)
History  This chart was scribed for Patricia Bonier, MD by Bennett Scrape. This patient was seen in room APA19/APA19 and the patient's care was started at 7:28PM.  CSN: [id number]  Arrival date & time 02/27/12  1821   First MD Initiated Contact with Patient 02/27/12 1916      Chief Complaint  Patient presents with  . Headache  . Emesis  . Diarrhea     The history is provided by the patient. No language interpreter was used.    Patricia [name] is a 21 y.o. female who presents to the Emergency Department complaining of 24 hours of gradual onset, gradually worsening, constant HA. The HA is described as a dull and throbbing ache. The location of the HA moves around from the sides of her head to the front and back. She rates her HA an 8.5 out of 10 currently. She lists 2 hours of watery diarrhea, constant nausea and 3 episodes of non-bloody emesis this morning as associated symptoms. Pt states that she is still feeling nauseated. She denies any modifying factors. She has not taken any medications PTA to improve symptoms. She denies abdominal pain, dysuria,n eck pain, back pain, cough, and SOB as associated symptoms. Pt reports that she is on birth control and that her LNMP was in January 2013. She denies being pregnant. She has no h/o chronic medical conditions. She denies smoking and alcohol use.  Past Medical History  Diagnosis Date  . No pertinent past medical history   . Preterm labor without delivery in second trimester June 2012    Positive FFN in June  . UTI (lower urinary tract infection)   . Bacterial vaginosis   . Chlamydia     Past Surgical History  Procedure Date  . No past surgeries     No family history on file.  History  Substance Use Topics  . Smoking status: Never Smoker   . Smokeless tobacco: Never Used  . Alcohol Use: No    OB History    Grav Para Term Preterm Abortions TAB SAB Ect Mult Living   1 1 1  0      1      [name]    Constitutional: Negative for fever and chills.  HENT: Negative for congestion, sore throat and neck pain.   Eyes: Negative for pain.  Respiratory: Negative for cough and shortness of breath.   Cardiovascular: Negative for chest pain.  Gastrointestinal: Positive for nausea, vomiting and diarrhea. Negative for abdominal pain.  Genitourinary: Negative for dysuria, urgency and hematuria.  Musculoskeletal: Negative for back pain.  Skin: Negative for rash.  Neurological: Positive for headaches. Negative for seizures.  Psychiatric/Behavioral: Negative for confusion.    Allergies  Review of patient's allergies indicates no known allergies.  Home Medications   Current Outpatient Rx  Name Route Sig Dispense Refill  . OMEPRAZOLE MAGNESIUM 20 MG PO TBEC Oral Take 1 tablet (20 mg total) by mouth daily. 14 tablet 0    Triage Vitals: BP 119/65  Pulse 83  Temp 98.5 F (36.9 C)  Resp 18  Ht 5\' 7"  (1.702 m)  Wt 245 lb (111.131 kg)  BMI 38.37 kg/m2  SpO2 100%  LMP 01/11/2012  Physical Exam  Nursing note and vitals reviewed. Constitutional: She is oriented to person, place, and time. She appears well-developed and well-nourished.  HENT:  Head: Normocephalic and atraumatic.       Moist mucous membranes, oropharynx is normal, TMs are normal bilaterally  Eyes:  Conjunctivae and EOM are normal. Pupils are equal, round, and reactive to light.       Normal funduscopic exam, no papillary erythema  Neck: Normal range of motion. Neck supple.  Cardiovascular: Normal rate, regular rhythm and normal heart sounds.  Exam reveals no gallop and no friction rub.   No murmur heard. Pulmonary/Chest: Effort normal and breath sounds normal. No respiratory distress. She has no wheezes. She has no rales.  Abdominal: Soft. Bowel sounds are normal. There is no tenderness. There is no rebound and no guarding.  Musculoskeletal: Normal range of motion. She exhibits no edema.  Neurological: She is alert and oriented  to person, place, and time.  Skin: Skin is warm and dry.  Psychiatric: She has a normal mood and affect. Her behavior is normal.    ED Course  Procedures (including critical care time)  DIAGNOSTIC STUDIES: Oxygen Saturation is 100% on room air, normal by my interpretation.    COORDINATION OF CARE: 7:37PM-Discussed norovirus diagnosis and symptoms with pt. Advised pt to expect symptoms to continue for another day or 2. Will give prescriptions for nausea.   Labs Reviewed  URINALYSIS, ROUTINE W REFLEX MICROSCOPIC - Abnormal; Notable for the following:    Specific Gravity, Urine >1.030 (*)    All other components within normal limits  POCT PREGNANCY, URINE   No results found.   No diagnosis found.    MDM  The patient has apparent viral gastroenteritis with mild dehydration and moderate headache, likely tension headache, and/or a viral headache, but no focal neurologic signs to suggest other acute intracranial process or need for CT imaging of the brain. I will treat the patient symptomatically, give her IV fluid rehydration, sure that she can tolerate oral fluid intake prior to being discharged home with anti-emetics.   I personally performed the services described in this documentation, which was scribed in my presence. The recorded information has been reviewed and considered.       Patricia Bonier, MD 02/27/12 2124

## 2012-02-27 NOTE — Discharge Instructions (Signed)
Clear Liquid Diet The clear liquid dietconsists of foods that are liquid or will become liquid at room temperature.You should be able to see through the liquid and beverages. Examples of foods allowed on a clear liquid diet include fruit juice, broth or bouillon, gelatin, or frozen ice pops. The purpose of this diet is to provide necessary fluid, electrolytes such as sodium and potassium, and energy to keep the body functioning during times when you are not able to consume a regular diet.A clear liquid diet should not be continued for long periods of time as it is not nutritionally adequate.  REASONS FOR USING A CLEAR LIQUID DIET  In sudden onset (acute) conditions for a patient before or after surgery.   As the first step in oral feeding.   For fluid and electrolyte replacement in diarrheal diseases.   As a diet before certain medical tests are performed.  ADEQUACY The clear liquid diet is adequate only in ascorbic acid, according to the Recommended Dietary Allowances of the [employer]. CHOOSING FOODS Breads and Starches  Allowed:  None are allowed.   Avoid: All are avoided.  Vegetables  Allowed:  Strained tomato or vegetable juice.   Avoid: Any others.  Fruit  Allowed:  Strained fruit juices and fruit drinks. Include 1 serving of citrus or vitamin C-enriched fruit juice daily.   Avoid: Any others.  Meat and Meat Substitutes  Allowed:  None are allowed.   Avoid: All are avoided.  Milk  Allowed:  None are allowed.   Avoid: All are avoided.  Soups and Combination Foods  Allowed:  Clear bouillon, broth, or strained broth-based soups.   Avoid: Any others.  Desserts and Sweets  Allowed:  Sugar, honey. High protein gelatin. Flavored gelatin, ices, or frozen ice pops that do not contain milk.   Avoid: Any others.  Fats and Oils  Allowed:  None are allowed.   Avoid: All are avoided.  Beverages  Allowed: Cereal beverages, coffee (regular or  decaffeinated), tea, or soda at the discretion of your caregiver.   Avoid: Any others.  Condiments  Allowed:  Iodized salt.   Avoid: Any others, including pepper.  Supplements  Allowed:  Liquid nutrition beverages.   Avoid: Any others that contain lactose or fiber.  SAMPLE MEAL PLAN Breakfast  4 oz (120 mL) strained orange juice.    to 1 cup (125 to 250 mL) gelatin (plain or fortified).   1 cup (250 mL) beverage (coffee or tea).   Sugar, if desired.  Midmorning Snack   cup (125 mL) gelatin (plain or fortified).  Lunch  1 cup (250 mL) broth or consomm.   4 oz (120 mL) strained grapefruit juice.    cup (125 mL) gelatin (plain or fortified).   1 cup (250 mL) beverage (coffee or tea).   Sugar, if desired.  Midafternoon Snack   cup (125 mL) fruit ice.    cup (125 mL) strained fruit juice.  Dinner  1 cup (250 mL) broth or consomm.    cup (125 mL) cranberry juice.    cup (125 mL) flavored gelatin (plain or fortified).   1 cup (250 mL) beverage (coffee or tea).   Sugar, if desired.  Evening Snack  4 oz (120 mL) strained apple juice (vitamin C-fortified).    cup (125 mL) flavored gelatin (plain or fortified).  Document Released: 11/13/2005 Document Revised: 11/02/2011 Document Reviewed: 02/10/2011 [facility] Patient Information 2012 [place], [place].Dehydration, Adult Dehydration is when you lose more fluids from the body  than you take in. Vital organs like the kidneys, brain, and heart cannot function without a proper amount of fluids and salt. Any loss of fluids from the body can cause dehydration.  CAUSES   Vomiting.   Diarrhea.   Excessive sweating.   Excessive urine output.   Fever.  SYMPTOMS  Mild dehydration  Thirst.   Dry lips.   Slightly dry mouth.  Moderate dehydration  Very dry mouth.   Sunken eyes.   Skin does not bounce back quickly when lightly pinched and released.   Dark urine and decreased urine production.    Decreased tear production.   Headache.  Severe dehydration  Very dry mouth.   Extreme thirst.   Rapid, weak pulse (more than 100 beats per minute at rest).   Cold hands and feet.   Not able to sweat in spite of heat and temperature.   Rapid breathing.   Blue lips.   Confusion and lethargy.   Difficulty being awakened.   Minimal urine production.   No tears.  DIAGNOSIS  Your caregiver will diagnose dehydration based on your symptoms and your exam. Blood and urine tests will help confirm the diagnosis. The diagnostic evaluation should also identify the cause of dehydration. TREATMENT  Treatment of mild or moderate dehydration can often be done at home by increasing the amount of fluids that you drink. It is best to drink small amounts of fluid more often. Drinking too much at one time can make vomiting worse. Refer to the home care instructions below. Severe dehydration needs to be treated at the hospital where you will probably be given intravenous (IV) fluids that contain water and electrolytes. HOME CARE INSTRUCTIONS   Ask your caregiver about specific rehydration instructions.   Drink enough fluids to keep your urine clear or pale yellow.   Drink small amounts frequently if you have nausea and vomiting.   Eat as you normally do.   Avoid:   Foods or drinks high in sugar.   Carbonated drinks.   Juice.   Extremely hot or cold fluids.   Drinks with caffeine.   Fatty, greasy foods.   Alcohol.   Tobacco.   Overeating.   Gelatin desserts.   Wash your hands well to avoid spreading bacteria and viruses.   Only take over-the-counter or prescription medicines for pain, discomfort, or fever as directed by your caregiver.   Ask your caregiver if you should continue all prescribed and over-the-counter medicines.   Keep all follow-up appointments with your caregiver.  SEEK MEDICAL CARE IF:  You have abdominal pain and it increases or stays in one area  (localizes).   You have a rash, stiff neck, or severe headache.   You are irritable, sleepy, or difficult to awaken.   You are weak, dizzy, or extremely thirsty.  SEEK IMMEDIATE MEDICAL CARE IF:   You are unable to keep fluids down or you get worse despite treatment.   You have frequent episodes of vomiting or diarrhea.   You have blood or green matter (bile) in your vomit.   You have blood in your stool or your stool looks black and tarry.   You have not urinated in 6 to 8 hours, or you have only urinated a small amount of very dark urine.   You have a fever.   You faint.  MAKE SURE YOU:   Understand these instructions.   Will watch your condition.   Will get help right away if you are not doing well  or get worse.  Document Released: 11/13/2005 Document Revised: 11/02/2011 Document Reviewed: 07/03/2011 [facility] Patient Information 2012 [place], [place].Headaches, Frequently Asked Questions MIGRAINE HEADACHES Q: What is migraine? What causes it? How can I treat it? A: Generally, migraine headaches begin as a dull ache. Then they develop into a constant, throbbing, and pulsating pain. You may experience pain at the temples. You may experience pain at the front or back of one or both sides of the head. The pain is usually accompanied by a combination of:  Nausea.   Vomiting.   Sensitivity to light and noise.  Some people (about 15%) experience an aura (see below) before an attack. The cause of migraine is believed to be chemical reactions in the brain. Treatment for migraine may include over-the-counter or prescription medications. It may also include self-help techniques. These include relaxation training and biofeedback.  Q: What is an aura? A: About 15% of people with migraine get an "aura". This is a sign of neurological symptoms that occur before a migraine headache. You may see wavy or jagged lines, dots, or flashing lights. You might experience tunnel vision or blind  spots in one or both eyes. The aura can include visual or auditory hallucinations (something imagined). It may include disruptions in smell (such as strange odors), taste or touch. Other symptoms include:  Numbness.   A "pins and needles" sensation.   Difficulty in recalling or speaking the correct word.  These neurological events may last as long as 60 minutes. These symptoms will fade as the headache begins. Q: What is a trigger? A: Certain physical or environmental factors can lead to or "trigger" a migraine. These include:  Foods.   Hormonal changes.   Weather.   Stress.  It is important to remember that triggers are different for everyone. To help prevent migraine attacks, you need to figure out which triggers affect you. Keep a headache diary. This is a good way to track triggers. The diary will help you talk to your healthcare professional about your condition. Q: Does weather affect migraines? A: Bright sunshine, hot, humid conditions, and drastic changes in barometric pressure may lead to, or "trigger," a migraine attack in some people. But studies have shown that weather does not act as a trigger for everyone with migraines. Q: What is the link between migraine and hormones? A: Hormones start and regulate many of your body's functions. Hormones keep your body in balance within a constantly changing environment. The levels of hormones in your body are unbalanced at times. Examples are during menstruation, pregnancy, or menopause. That can lead to a migraine attack. In fact, about three quarters of all women with migraine report that their attacks are related to the menstrual cycle.  Q: Is there an increased risk of stroke for migraine sufferers? A: The likelihood of a migraine attack causing a stroke is very remote. That is not to say that migraine sufferers cannot have a stroke associated with their migraines. In persons under age 28, the most common associated factor for stroke is  migraine headache. But over the course of a person's normal life span, the occurrence of migraine headache may actually be associated with a reduced risk of dying from cerebrovascular disease due to stroke.  Q: What are acute medications for migraine? A: Acute medications are used to treat the pain of the headache after it has started. Examples over-the-counter medications, NSAIDs, ergots, and triptans.  Q: What are the triptans? A: Triptans are the newest class of abortive  medications. They are specifically targeted to treat migraine. Triptans are vasoconstrictors. They moderate some chemical reactions in the brain. The triptans work on receptors in your brain. Triptans help to restore the balance of a neurotransmitter called serotonin. Fluctuations in levels of serotonin are thought to be a main cause of migraine.  Q: Are over-the-counter medications for migraine effective? A: Over-the-counter, or "OTC," medications may be effective in relieving mild to moderate pain and associated symptoms of migraine. But you should see your caregiver before beginning any treatment regimen for migraine.  Q: What are preventive medications for migraine? A: Preventive medications for migraine are sometimes referred to as "prophylactic" treatments. They are used to reduce the frequency, severity, and length of migraine attacks. Examples of preventive medications include antiepileptic medications, antidepressants, beta-blockers, calcium channel blockers, and NSAIDs (nonsteroidal anti-inflammatory drugs). Q: Why are anticonvulsants used to treat migraine? A: During the past few years, there has been an increased interest in antiepileptic drugs for the prevention of migraine. They are sometimes referred to as "anticonvulsants". Both epilepsy and migraine may be caused by similar reactions in the brain.  Q: Why are antidepressants used to treat migraine? A: Antidepressants are typically used to treat people with depression.  They may reduce migraine frequency by regulating chemical levels, such as serotonin, in the brain.  Q: What alternative therapies are used to treat migraine? A: The term "alternative therapies" is often used to describe treatments considered outside the scope of conventional Western medicine. Examples of alternative therapy include acupuncture, acupressure, and yoga. Another common alternative treatment is herbal therapy. Some herbs are believed to relieve headache pain. Always discuss alternative therapies with your caregiver before proceeding. Some herbal products contain arsenic and other toxins. TENSION HEADACHES Q: What is a tension-type headache? What causes it? How can I treat it? A: Tension-type headaches occur randomly. They are often the result of temporary stress, anxiety, fatigue, or anger. Symptoms include soreness in your temples, a tightening band-like sensation around your head (a "vice-like" ache). Symptoms can also include a pulling feeling, pressure sensations, and contracting head and neck muscles. The headache begins in your forehead, temples, or the back of your head and neck. Treatment for tension-type headache may include over-the-counter or prescription medications. Treatment may also include self-help techniques such as relaxation training and biofeedback. CLUSTER HEADACHES Q: What is a cluster headache? What causes it? How can I treat it? A: Cluster headache gets its name because the attacks come in groups. The pain arrives with little, if any, warning. It is usually on one side of the head. A tearing or bloodshot eye and a runny nose on the same side of the headache may also accompany the pain. Cluster headaches are believed to be caused by chemical reactions in the brain. They have been described as the most severe and intense of any headache type. Treatment for cluster headache includes prescription medication and oxygen. SINUS HEADACHES Q: What is a sinus headache? What  causes it? How can I treat it? A: When a cavity in the bones of the face and skull (a sinus) becomes inflamed, the inflammation will cause localized pain. This condition is usually the result of an allergic reaction, a tumor, or an infection. If your headache is caused by a sinus blockage, such as an infection, you will probably have a fever. An x-ray will confirm a sinus blockage. Your caregiver's treatment might include antibiotics for the infection, as well as antihistamines or decongestants.  REBOUND HEADACHES Q: What is a  rebound headache? What causes it? How can I treat it? A: A pattern of taking acute headache medications too often can lead to a condition known as "rebound headache." A pattern of taking too much headache medication includes taking it more than 2 days per week or in excessive amounts. That means more than the label or a caregiver advises. With rebound headaches, your medications not only stop relieving pain, they actually begin to cause headaches. Doctors treat rebound headache by tapering the medication that is being overused. Sometimes your caregiver will gradually substitute a different type of treatment or medication. Stopping may be a challenge. Regularly overusing a medication increases the potential for serious side effects. Consult a caregiver if you regularly use headache medications more than 2 days per week or more than the label advises. ADDITIONAL QUESTIONS AND ANSWERS Q: What is biofeedback? A: Biofeedback is a self-help treatment. Biofeedback uses special equipment to monitor your body's involuntary physical responses. Biofeedback monitors:  Breathing.   Pulse.   Heart rate.   Temperature.   Muscle tension.   Brain activity.  Biofeedback helps you refine and perfect your relaxation exercises. You learn to control the physical responses that are related to stress. Once the technique has been mastered, you do not need the equipment any more. Q: Are headaches  hereditary? A: Four out of five (80%) of people that suffer report a family history of migraine. Scientists are not sure if this is genetic or a family predisposition. Despite the uncertainty, a child has a 50% chance of having migraine if one parent suffers. The child has a 75% chance if both parents suffer.  Q: Can children get headaches? A: By the time they reach high school, most young people have experienced some type of headache. Many safe and effective approaches or medications can prevent a headache from occurring or stop it after it has begun.  Q: What type of doctor should I see to diagnose and treat my headache? A: Start with your primary caregiver. Discuss his or her experience and approach to headaches. Discuss methods of classification, diagnosis, and treatment. Your caregiver may decide to recommend you to a headache specialist, depending upon your symptoms or other physical conditions. Having diabetes, allergies, etc., may require a more comprehensive and inclusive approach to your headache. The National [employer] will provide, upon request, a list of [facility] physician members in your state. Document Released: 02/03/2004 Document Revised: 11/02/2011 Document Reviewed: 07/13/2008 [facility] Patient Information 2012 [place], [place].Headaches, Frequently Asked Questions MIGRAINE HEADACHES Q: What is migraine? What causes it? How can I treat it? A: Generally, migraine headaches begin as a dull ache. Then they develop into a constant, throbbing, and pulsating pain. You may experience pain at the temples. You may experience pain at the front or back of one or both sides of the head. The pain is usually accompanied by a combination of:  Nausea.   Vomiting.   Sensitivity to light and noise.  Some people (about 15%) experience an aura (see below) before an attack. The cause of migraine is believed to be chemical reactions in the brain. Treatment for migraine may include over-the-counter  or prescription medications. It may also include self-help techniques. These include relaxation training and biofeedback.  Q: What is an aura? A: About 15% of people with migraine get an "aura". This is a sign of neurological symptoms that occur before a migraine headache. You may see wavy or jagged lines, dots, or flashing lights. You might experience tunnel vision  or blind spots in one or both eyes. The aura can include visual or auditory hallucinations (something imagined). It may include disruptions in smell (such as strange odors), taste or touch. Other symptoms include:  Numbness.   A "pins and needles" sensation.   Difficulty in recalling or speaking the correct word.  These neurological events may last as long as 60 minutes. These symptoms will fade as the headache begins. Q: What is a trigger? A: Certain physical or environmental factors can lead to or "trigger" a migraine. These include:  Foods.   Hormonal changes.   Weather.   Stress.  It is important to remember that triggers are different for everyone. To help prevent migraine attacks, you need to figure out which triggers affect you. Keep a headache diary. This is a good way to track triggers. The diary will help you talk to your healthcare professional about your condition. Q: Does weather affect migraines? A: Bright sunshine, hot, humid conditions, and drastic changes in barometric pressure may lead to, or "trigger," a migraine attack in some people. But studies have shown that weather does not act as a trigger for everyone with migraines. Q: What is the link between migraine and hormones? A: Hormones start and regulate many of your body's functions. Hormones keep your body in balance within a constantly changing environment. The levels of hormones in your body are unbalanced at times. Examples are during menstruation, pregnancy, or menopause. That can lead to a migraine attack. In fact, about three quarters of all women with  migraine report that their attacks are related to the menstrual cycle.  Q: Is there an increased risk of stroke for migraine sufferers? A: The likelihood of a migraine attack causing a stroke is very remote. That is not to say that migraine sufferers cannot have a stroke associated with their migraines. In persons under age 39, the most common associated factor for stroke is migraine headache. But over the course of a person's normal life span, the occurrence of migraine headache may actually be associated with a reduced risk of dying from cerebrovascular disease due to stroke.  Q: What are acute medications for migraine? A: Acute medications are used to treat the pain of the headache after it has started. Examples over-the-counter medications, NSAIDs, ergots, and triptans.  Q: What are the triptans? A: Triptans are the newest class of abortive medications. They are specifically targeted to treat migraine. Triptans are vasoconstrictors. They moderate some chemical reactions in the brain. The triptans work on receptors in your brain. Triptans help to restore the balance of a neurotransmitter called serotonin. Fluctuations in levels of serotonin are thought to be a main cause of migraine.  Q: Are over-the-counter medications for migraine effective? A: Over-the-counter, or "OTC," medications may be effective in relieving mild to moderate pain and associated symptoms of migraine. But you should see your caregiver before beginning any treatment regimen for migraine.  Q: What are preventive medications for migraine? A: Preventive medications for migraine are sometimes referred to as "prophylactic" treatments. They are used to reduce the frequency, severity, and length of migraine attacks. Examples of preventive medications include antiepileptic medications, antidepressants, beta-blockers, calcium channel blockers, and NSAIDs (nonsteroidal anti-inflammatory drugs). Q: Why are anticonvulsants used to treat  migraine? A: During the past few years, there has been an increased interest in antiepileptic drugs for the prevention of migraine. They are sometimes referred to as "anticonvulsants". Both epilepsy and migraine may be caused by similar reactions in the brain.  Q:  Why are antidepressants used to treat migraine? A: Antidepressants are typically used to treat people with depression. They may reduce migraine frequency by regulating chemical levels, such as serotonin, in the brain.  Q: What alternative therapies are used to treat migraine? A: The term "alternative therapies" is often used to describe treatments considered outside the scope of conventional Western medicine. Examples of alternative therapy include acupuncture, acupressure, and yoga. Another common alternative treatment is herbal therapy. Some herbs are believed to relieve headache pain. Always discuss alternative therapies with your caregiver before proceeding. Some herbal products contain arsenic and other toxins. TENSION HEADACHES Q: What is a tension-type headache? What causes it? How can I treat it? A: Tension-type headaches occur randomly. They are often the result of temporary stress, anxiety, fatigue, or anger. Symptoms include soreness in your temples, a tightening band-like sensation around your head (a "vice-like" ache). Symptoms can also include a pulling feeling, pressure sensations, and contracting head and neck muscles. The headache begins in your forehead, temples, or the back of your head and neck. Treatment for tension-type headache may include over-the-counter or prescription medications. Treatment may also include self-help techniques such as relaxation training and biofeedback. CLUSTER HEADACHES Q: What is a cluster headache? What causes it? How can I treat it? A: Cluster headache gets its name because the attacks come in groups. The pain arrives with little, if any, warning. It is usually on one side of the head. A tearing  or bloodshot eye and a runny nose on the same side of the headache may also accompany the pain. Cluster headaches are believed to be caused by chemical reactions in the brain. They have been described as the most severe and intense of any headache type. Treatment for cluster headache includes prescription medication and oxygen. SINUS HEADACHES Q: What is a sinus headache? What causes it? How can I treat it? A: When a cavity in the bones of the face and skull (a sinus) becomes inflamed, the inflammation will cause localized pain. This condition is usually the result of an allergic reaction, a tumor, or an infection. If your headache is caused by a sinus blockage, such as an infection, you will probably have a fever. An x-ray will confirm a sinus blockage. Your caregiver's treatment might include antibiotics for the infection, as well as antihistamines or decongestants.  REBOUND HEADACHES Q: What is a rebound headache? What causes it? How can I treat it? A: A pattern of taking acute headache medications too often can lead to a condition known as "rebound headache." A pattern of taking too much headache medication includes taking it more than 2 days per week or in excessive amounts. That means more than the label or a caregiver advises. With rebound headaches, your medications not only stop relieving pain, they actually begin to cause headaches. Doctors treat rebound headache by tapering the medication that is being overused. Sometimes your caregiver will gradually substitute a different type of treatment or medication. Stopping may be a challenge. Regularly overusing a medication increases the potential for serious side effects. Consult a caregiver if you regularly use headache medications more than 2 days per week or more than the label advises. ADDITIONAL QUESTIONS AND ANSWERS Q: What is biofeedback? A: Biofeedback is a self-help treatment. Biofeedback uses special equipment to monitor your body's  involuntary physical responses. Biofeedback monitors:  Breathing.   Pulse.   Heart rate.   Temperature.   Muscle tension.   Brain activity.  Biofeedback helps you refine and perfect  your relaxation exercises. You learn to control the physical responses that are related to stress. Once the technique has been mastered, you do not need the equipment any more. Q: Are headaches hereditary? A: Four out of five (80%) of people that suffer report a family history of migraine. Scientists are not sure if this is genetic or a family predisposition. Despite the uncertainty, a child has a 50% chance of having migraine if one parent suffers. The child has a 75% chance if both parents suffer.  Q: Can children get headaches? A: By the time they reach high school, most young people have experienced some type of headache. Many safe and effective approaches or medications can prevent a headache from occurring or stop it after it has begun.  Q: What type of doctor should I see to diagnose and treat my headache? A: Start with your primary caregiver. Discuss his or her experience and approach to headaches. Discuss methods of classification, diagnosis, and treatment. Your caregiver may decide to recommend you to a headache specialist, depending upon your symptoms or other physical conditions. Having diabetes, allergies, etc., may require a more comprehensive and inclusive approach to your headache. The National Head[employer] Foundation will provide, upon request, a list of [facility] physician members in your state. Document Released: 02/03/2004 Document Revised: 11/02/2011 Document Reviewed: 07/13/2008 [facility] Patient Information 2012 [place], [place].

## 2012-02-27 NOTE — ED Notes (Signed)
Pt DC to home with steady gait 

## 2012-03-24 ENCOUNTER — Emergency Department (HOSPITAL_COMMUNITY)
Admission: EM | Admit: 2012-03-24 | Discharge: 2012-03-25 | Disposition: A | Discharge status: Home / Self Care | Payer: Medicaid Other | Attending: Emergency Medicine | Admitting: Emergency Medicine

## 2012-03-24 ENCOUNTER — Encounter (HOSPITAL_COMMUNITY): Payer: Self-pay | Admitting: *Deleted

## 2012-03-24 DIAGNOSIS — R079 Chest pain, unspecified: Secondary | ICD-10-CM | POA: Insufficient documentation

## 2012-03-24 NOTE — ED Notes (Signed)
Pt reports epigastric burning sensation starting tonight, reports pain increased with palpation

## 2012-03-25 MED ORDER — GI COCKTAIL ~~LOC~~
30.0000 mL | Freq: Once | ORAL | Status: AC
  Administered 2012-03-25: 30 mL via ORAL
  Filled 2012-03-25: qty 30

## 2012-03-25 NOTE — ED Provider Notes (Signed)
History     CSN: [id number]  Arrival date & time 03/24/12  2310   First MD Initiated Contact with Patient 03/24/12 2347      Chief Complaint  Patient presents with  . Chest Pain     Patient is a 21 y.o. female presenting with chest pain. The history is provided by the patient.  Chest Pain Episode onset: several hours ago. Chest pain occurs constantly. The chest pain is unchanged. Associated with: nothing. The severity of the pain is mild. The quality of the pain is described as burning. The pain radiates to the epigastrium. Chest pain is worsened by certain positions. Pertinent negatives for primary symptoms include no fever, no shortness of breath, no cough, no nausea, no vomiting and no dizziness.  Pertinent negatives for associated symptoms include no diaphoresis. She tried nothing for the symptoms.   Pt reports chest burning that radiates into epigastrium No sob No fever No vomiting   Past Medical History  Diagnosis Date  . No pertinent past medical history   . Preterm labor without delivery in second trimester June 2012    Positive FFN in June  . UTI (lower urinary tract infection)   . Bacterial vaginosis   . Chlamydia     Past Surgical History  Procedure Date  . No past surgeries     No family history on file.  History  Substance Use Topics  . Smoking status: Never Smoker   . Smokeless tobacco: Never Used  . Alcohol Use: No    OB History    Grav Para Term Preterm Abortions TAB SAB Ect Mult Living   1 1 1  0      1      [name]  Constitutional: Negative for fever and diaphoresis.  Respiratory: Negative for cough and shortness of breath.   Cardiovascular: Positive for chest pain.  Gastrointestinal: Negative for nausea and vomiting.  Neurological: Negative for dizziness.    Allergies  Review of patient's allergies indicates no known allergies.  Home Medications   Current Outpatient Rx  Name Route Sig Dispense Refill  . IMPLANON [place]  Subcutaneous Inject into the skin.    Marland Kitchen FAMOTIDINE 20 MG PO TABS Oral Take 1 tablet (20 mg total) by mouth 2 (two) times daily as needed for heartburn (upset stomach). 14 tablet 0  . OMEPRAZOLE MAGNESIUM 20 MG PO TBEC Oral Take 1 tablet (20 mg total) by mouth daily. 14 tablet 0    BP 109/65  Pulse 82  Temp(Src) 98.3 F (36.8 C) (Oral)  Resp 20  Ht 5\' 7"  (1.702 m)  Wt 236 lb (107.049 kg)  BMI 36.96 kg/m2  SpO2 100%  Physical Exam CONSTITUTIONAL: Well developed/well nourished HEAD AND FACE: Normocephalic/atraumatic EYES: EOMI/PERRL ENMT: Mucous membranes moist NECK: supple no meningeal signs SPINE:entire spine nontender CV: S1/S2 noted, no murmurs/rubs/gallops noted LUNGS: Lungs are clear to auscultation bilaterally, no apparent distress ABDOMEN: soft, nontender, no rebound or guarding GU:no cva tenderness NEURO: Pt is awake/alert, moves all extremitiesx4 EXTREMITIES: pulses normal, full ROM SKIN: warm, color normal PSYCH: no abnormalities of mood noted  ED Course  Procedures     1. Chest pain    Pt well appearing, no distress, resting comfortably, her exam is unremarkable Given that she has chest burning that radiates into epigastrium, GI cocktail  EKG unremarkable, defer further workup as patient is well appearing   MDM  Nursing notes reviewed and considered in documentation Previous records reviewed and considered  Date: 03/25/2012  Rate: 75  Rhythm: normal sinus rhythm  QRS Axis: normal  Intervals: normal  ST/T Wave abnormalities: normal  Conduction Disutrbances:none  Narrative Interpretation:   Old EKG Reviewed: unchanged    Joya Gaskins, MD 03/25/12 0104

## 2012-05-25 ENCOUNTER — Encounter (HOSPITAL_COMMUNITY): Payer: Self-pay

## 2012-05-25 ENCOUNTER — Emergency Department (HOSPITAL_COMMUNITY)
Admission: EM | Admit: 2012-05-25 | Discharge: 2012-05-26 | Disposition: A | Discharge status: Home / Self Care | Payer: Medicaid Other | Attending: Emergency Medicine | Admitting: Emergency Medicine

## 2012-05-25 DIAGNOSIS — R102 Pelvic and perineal pain: Secondary | ICD-10-CM

## 2012-05-25 DIAGNOSIS — N949 Unspecified condition associated with female genital organs and menstrual cycle: Secondary | ICD-10-CM | POA: Insufficient documentation

## 2012-05-25 NOTE — ED Provider Notes (Signed)
History     CSN: [id number]  Arrival date & time 05/25/12  2256   First MD Initiated Contact with Patient 05/25/12 2334      Chief Complaint  Patient presents with  . Abdominal Pain    (Consider location/radiation/quality/duration/timing/severity/associated sxs/prior treatment) HPI Comments: Awakened today with lower abdominal bilaterally and R posterior flank / back pain.  Began worsening ~ 1100 today and again ~ 2000 today.  No known injury.  No lifting or twisting.  No previous abdominal surgeries.  9 months post-delivery.  LMP 05-16-12.  + sexually active and using oral contraceptive.  No vaginal d/c and no abnormal bleeding.  No fever or chills.  Patient is a 21 y.o. female presenting with abdominal pain. The history is provided by the patient. No language interpreter was used.  Abdominal Pain The primary symptoms of the illness include abdominal pain. The primary symptoms of the illness do not include fever, nausea, vomiting, diarrhea, dysuria, vaginal discharge or vaginal bleeding. The onset of the illness was sudden. The problem has been gradually worsening.  The patient states that she believes she is currently not pregnant. The patient has not had a change in bowel habit. Additional symptoms associated with the illness include frequency and back pain. Symptoms associated with the illness do not include chills, anorexia, diaphoresis, constipation, urgency or hematuria. Significant associated medical issues do not include inflammatory bowel disease, diabetes, sickle cell disease, liver disease, substance abuse, diverticulitis, HIV or cardiac disease.    Past Medical History  Diagnosis Date  . No pertinent past medical history   . Preterm labor without delivery in second trimester June 2012    Positive FFN in June  . UTI (lower urinary tract infection)   . Bacterial vaginosis   . Chlamydia     Past Surgical History  Procedure Date  . No past surgeries     History reviewed.  No pertinent family history.  History  Substance Use Topics  . Smoking status: Never Smoker   . Smokeless tobacco: Never Used  . Alcohol Use: No    OB History    Grav Para Term Preterm Abortions TAB SAB Ect Mult Living   1 1 1  0      1      [name]  Constitutional: Negative for fever, chills and diaphoresis.  Gastrointestinal: Positive for abdominal pain. Negative for nausea, vomiting, diarrhea, constipation and anorexia.  Genitourinary: Positive for frequency. Negative for dysuria, urgency, hematuria, vaginal bleeding and vaginal discharge.  Musculoskeletal: Positive for back pain.    Allergies  Review of patient's allergies indicates no known allergies.  Home Medications   Current Outpatient Rx  Name Route Sig Dispense Refill  . NORETHINDRON-ETHINYL ESTRAD-FE 1-20/1-30/1-35 MG-MCG PO TABS Oral Take 1 tablet by mouth daily.    Marland Kitchen HYDROCODONE-ACETAMINOPHEN 5-325 MG PO TABS Oral Take 1 tablet by mouth every 6 (six) hours as needed for pain. 20 tablet 0    BP 122/61  Pulse 75  Temp 98.3 F (36.8 C) (Oral)  Resp 16  Ht 5\' 7"  (1.702 m)  Wt 268 lb (121.564 kg)  BMI 41.97 kg/m2  SpO2 98%  LMP 05/16/2012  Breastfeeding? No  Physical Exam  Nursing note and vitals reviewed. Constitutional: She is oriented to person, place, and time. She appears well-developed and well-nourished. No distress.  HENT:  Head: Normocephalic and atraumatic.  Eyes: EOM are normal.  Neck: Normal range of motion.  Cardiovascular: Normal rate, regular rhythm and normal heart sounds.  Pulmonary/Chest: Effort normal and breath sounds normal.  Abdominal: Soft. Bowel sounds are normal. She exhibits no distension and no mass. There is tenderness in the suprapubic area. There is no rigidity, no rebound, no guarding, no tenderness at McBurney's point and negative Murphy's sign.    Genitourinary: Pelvic exam was performed with patient supine. There is tenderness around the vagina. No erythema  or bleeding around the vagina. No foreign body around the vagina. No signs of injury around the vagina. No vaginal discharge found.       No vaginal d/c on speculum exam.  Bimanual exam reveals generalized  Tenderness throughout pelvis.  No palpable abnormalities.  Musculoskeletal: Normal range of motion.       Lumbar back: She exhibits tenderness and pain. She exhibits normal range of motion, no bony tenderness, no swelling, no edema, no deformity, no laceration, no spasm and normal pulse.       Back:  Neurological: She is alert and oriented to person, place, and time.  Skin: Skin is warm and dry.  Psychiatric: She has a normal mood and affect. Judgment normal.    ED Course  Procedures (including critical care time)  Labs Reviewed  URINALYSIS, ROUTINE W REFLEX MICROSCOPIC - Abnormal; Notable for the following:    Specific Gravity, Urine >1.030 (*)     All other components within normal limits  PREGNANCY, URINE   No results found.   1. Pelvic pain in female       MDM  Discussed labs with pt.  After pelvic exam i told her that i want to arrange an pelvic ultrasound.  She is a pt of dr. Forestine Chute  The u/s is being scheduled.        Evalina Field, [place] 05/26/12 [phone]

## 2012-05-25 NOTE — ED Notes (Signed)
Hurting in lower back and comes around into lower abdomen per pt.

## 2012-05-26 ENCOUNTER — Ambulatory Visit (HOSPITAL_COMMUNITY)
Admit: 2012-05-26 | Discharge: 2012-05-26 | Disposition: A | Discharge status: Home / Self Care | Payer: Medicaid Other | Attending: Emergency Medicine | Admitting: Emergency Medicine

## 2012-05-26 ENCOUNTER — Other Ambulatory Visit (HOSPITAL_COMMUNITY): Payer: Self-pay | Admitting: Emergency Medicine

## 2012-05-26 DIAGNOSIS — R102 Pelvic and perineal pain: Secondary | ICD-10-CM

## 2012-05-26 DIAGNOSIS — N949 Unspecified condition associated with female genital organs and menstrual cycle: Secondary | ICD-10-CM | POA: Insufficient documentation

## 2012-05-26 LAB — URINALYSIS, ROUTINE W REFLEX MICROSCOPIC
Glucose, UA: NEGATIVE mg/dL
Ketones, ur: NEGATIVE mg/dL
Leukocytes, UA: NEGATIVE
Nitrite: NEGATIVE
Protein, ur: NEGATIVE mg/dL
Urobilinogen, UA: 1 mg/dL (ref 0.0–1.0)

## 2012-05-26 MED ORDER — HYDROCODONE-ACETAMINOPHEN 5-325 MG PO TABS: 1.0000 | ORAL_TABLET | Freq: Four times a day (QID) | ORAL | Status: AC | PRN

## 2012-05-26 NOTE — Discharge Instructions (Signed)
Pelvic Pain Pelvic pain is pain below the belly button and located between your hips. Acute pain may last a few hours or days. Chronic pelvic pain may last weeks and months. The cause may be different for different types of pain. The pain may be dull or sharp, mild or severe and can interfere with your daily activities. Write down and tell your caregiver:   Exactly where the pain is located.   If it comes and goes or is there all the time.   When it happens (with sex, urination, bowel movement, etc.)   If the pain is related to your menstrual period or stress.  Your caregiver will take a full history and do a complete physical exam and Pap test. CAUSES   Painful menstrual periods (dysmenorrhea).   Normal ovulation (M[place]chmertz) that occurs in the middle of the menstrual cycle every month.   The pelvic organs get engorged with blood just before the menstrual period (pelvic congestive syndrome).   Scar tissue from an infection or past surgery (pelvic adhesions).   Cancer of the female pelvic organs. When there is pain with cancer, it has been there for a long time.   The lining of the uterus (endometrium) abnormally grows in places like the pelvis and on the pelvic organs (endometriosis).   A form of endometriosis with the lining of the uterus present inside of the muscle tissue of the uterus (adenomyosis).   Fibroid tumor (noncancerous) in the uterus.   Bladder problems such as infection, bladder spasms of the muscle tissue of the bladder.   Intestinal problems (irritable bowel syndrome, colitis, an ulcer or gastrointestinal infection).   Polyps of the cervix or uterus.   Pregnancy in the tube (ectopic pregnancy).   The opening of the cervix is too small for the menstrual blood to flow through it (cervical stenosis).   Physical or sexual abuse (past or present).   Musculo-skeletal problems from poor posture, problems with the vertebrae of the lower back or the uterine  pelvic muscles falling (prolapse).   Psychological problems such as depression or stress.   IUD (intrauterine device) in the uterus.  DIAGNOSIS  Tests to make a diagnosis depends on the type, location, severity and what causes the pain to occur. Tests that may be needed include:  Blood tests.   Urine tests   Ultrasound.   X-rays.   CT Scan.   MRI.   Laparoscopy.   Major surgery.  TREATMENT  Treatment will depend on the cause of the pain, which includes:  Prescription or over-the-counter pain medication.   Antibiotics.   Birth control pills.   Hormone treatment.   Nerve blocking injections.   Physical therapy.   Antidepressants.   Counseling with a psychiatrist or psychologist.   Minor or major surgery.  HOME CARE INSTRUCTIONS   Only take over-the-counter or prescription medicines for pain, discomfort or fever as directed by your caregiver.   Follow your caregiver's advice to treat your pain.   Rest.   Avoid sexual intercourse if it causes the pain.   Apply warm or cold compresses (which ever works best) to the pain area.   Do relaxation exercises such as yoga or meditation.   Try acupuncture.   Avoid stressful situations.   Try group therapy.   If the pain is because of a stomach/intestinal upset, drink clear liquids, eat a bland light food diet until the symptoms go away.  SEEK MEDICAL CARE IF:   You need stronger prescription pain medication.     You develop pain with sexual intercourse.   You have pain with urination.   You develop a temperature of 102 F (38.9 C) with the pain.   You are still in pain after 4 hours of taking prescription medication for the pain.   You need depression medication.   Your IUD is causing pain and you want it removed.  SEEK IMMEDIATE MEDICAL CARE IF:  You develop very severe pain or tenderness.   You faint, have chills, severe weakness or dehydration.   You develop heavy vaginal bleeding or passing  solid tissue.   You develop a temperature of 102 F (38.9 C) with the pain.   You have blood in the urine.   You are being physically or sexually abused.   You have uncontrolled vomiting and diarrhea.   You are depressed and afraid of harming yourself or someone else.  Document Released: 12/21/2004 Document Revised: 11/02/2011 Document Reviewed: 09/17/2008 [facility] Patient Information 2012 [place], [place].  We are arranging a pelvic ultrasound for you tomorrow in radiology as an out patient.  We will provide the appt time for you before you are discharged.  After the exam tomorrow, return to the ED but do not check in.  The results will be forwarded to the emergency physician on duty.  They will give you the results and any follow up care they deem necessary.

## 2012-05-26 NOTE — ED Provider Notes (Signed)
Pt returns for outpatient [place] of pelvis, results shown below.  Pt states she goes to [facility], advised to follow-up with them if she continues to have pain.    *RADIOLOGY REPORT*  Clinical Data: Pelvic pain.  TRANSABDOMINAL AND TRANSVAGINAL ULTRASOUND OF PELVIS  DOPPLER ULTRASOUND OF OVARIES  Technique: Both transabdominal and transvaginal ultrasound  examinations of the pelvis were performed. Transabdominal technique  was performed for global imaging of the pelvis including uterus,  ovaries, adnexal regions, and pelvic cul-de-sac.  It was necessary to proceed with endovaginal exam following the  transabdominal exam to visualize the endometrium.  Color and duplex Doppler ultrasound was utilized to evaluate blood  flow to the ovaries.  Comparison: No priors.  Findings:  Uterus: Normal size and appearance measuring 8.4 x 4.1 x 5.1 cm.  No focal lesions.  Endometrium: Normal in thickness measuring 7.8 mm.  Right ovary: Normal in echotexture and appearance measuring 3.8 x  2.4 x 2.5 cm. Multiple normal-appearing follicles.  Left ovary: Left ovary was poorly visualized, but is normal in  size measuring 3.3 x 2.1 x 3.0 cm.  Pulsed Doppler evaluation demonstrates normal low-resistance  arterial and venous waveforms in both ovaries.  Trace volume of free fluid within the left adnexa.  IMPRESSION:  1. A trace volume of free fluid in the region of the left adnexa  is presumably physiologic in this young female patient.  2. No acute findings. Specifically, no evidence of ovarian  torsion.  No sonographic evidence for ovarian torsion.  Original Report Authenticated By: Florencia Reasons, M.D.   Devoria Albe, MD, FACEP    Ward Givens, MD 05/26/12 1000

## 2012-06-01 NOTE — ED Provider Notes (Signed)
Medical screening examination/treatment/procedure(s) were performed by non-physician practitioner and as supervising physician I was immediately available for consultation/collaboration.  Raley Novicki S. Sherrilynn Gudgel, MD 06/01/12 2126 

## 2012-06-03 ENCOUNTER — Emergency Department (HOSPITAL_COMMUNITY)
Admission: EM | Admit: 2012-06-03 | Discharge: 2012-06-03 | Disposition: A | Discharge status: Home / Self Care | Payer: Medicaid Other | Attending: Emergency Medicine | Admitting: Emergency Medicine

## 2012-06-03 ENCOUNTER — Emergency Department (HOSPITAL_COMMUNITY): Payer: Medicaid Other

## 2012-06-03 ENCOUNTER — Encounter (HOSPITAL_COMMUNITY): Payer: Self-pay | Admitting: *Deleted

## 2012-06-03 DIAGNOSIS — Z79899 Other long term (current) drug therapy: Secondary | ICD-10-CM | POA: Insufficient documentation

## 2012-06-03 DIAGNOSIS — K5900 Constipation, unspecified: Secondary | ICD-10-CM | POA: Insufficient documentation

## 2012-06-03 DIAGNOSIS — R300 Dysuria: Secondary | ICD-10-CM | POA: Insufficient documentation

## 2012-06-03 DIAGNOSIS — M549 Dorsalgia, unspecified: Secondary | ICD-10-CM | POA: Insufficient documentation

## 2012-06-03 DIAGNOSIS — R109 Unspecified abdominal pain: Secondary | ICD-10-CM | POA: Insufficient documentation

## 2012-06-03 DIAGNOSIS — R10819 Abdominal tenderness, unspecified site: Secondary | ICD-10-CM | POA: Insufficient documentation

## 2012-06-03 LAB — POCT I-STAT, CHEM 8
BUN: 9 mg/dL (ref 6–23)
Calcium, Ion: 1.24 mmol/L — ABNORMAL HIGH (ref 1.12–1.23)
Chloride: 104 meq/L (ref 96–112)
Creatinine, Ser: 1 mg/dL (ref 0.50–1.10)
Glucose, Bld: 96 mg/dL (ref 70–99)
HCT: 35 % — ABNORMAL LOW (ref 36.0–46.0)
Hemoglobin: 11.9 g/dL — ABNORMAL LOW (ref 12.0–15.0)
Potassium: 3.8 meq/L (ref 3.5–5.1)
Sodium: 140 meq/L (ref 135–145)
TCO2: 23 mmol/L (ref 0–100)

## 2012-06-03 LAB — HEPATIC FUNCTION PANEL
ALT: 15 U/L (ref 0–35)
AST: 17 U/L (ref 0–37)
Albumin: 3.3 g/dL — ABNORMAL LOW (ref 3.5–5.2)
Bilirubin, Direct: <0.1 mg/dL (ref 0.0–0.3)
Total Bilirubin: 0.3 mg/dL (ref 0.3–1.2)

## 2012-06-03 LAB — CBC WITH DIFFERENTIAL/PLATELET
Basophils Absolute: 0 K/uL (ref 0.0–0.1)
Basophils Relative: 0 % (ref 0–1)
Eosinophils Absolute: 0.1 K/uL (ref 0.0–0.7)
Eosinophils Relative: 2 % (ref 0–5)
HCT: 34.7 % — ABNORMAL LOW (ref 36.0–46.0)
Hemoglobin: 11.4 g/dL — ABNORMAL LOW (ref 12.0–15.0)
Lymphocytes Relative: 43 % (ref 12–46)
Lymphs Abs: 2.7 K/uL (ref 0.7–4.0)
MCH: 27.1 pg (ref 26.0–34.0)
MCHC: 32.9 g/dL (ref 30.0–36.0)
MCV: 82.4 fL (ref 78.0–100.0)
Monocytes Absolute: 0.4 K/uL (ref 0.1–1.0)
Monocytes Relative: 7 % (ref 3–12)
Neutro Abs: 3 K/uL (ref 1.7–7.7)
Neutrophils Relative %: 48 % (ref 43–77)
Platelets: 231 K/uL (ref 150–400)
RBC: 4.21 MIL/uL (ref 3.87–5.11)
RDW: 13.6 % (ref 11.5–15.5)
WBC: 6.3 K/uL (ref 4.0–10.5)

## 2012-06-03 LAB — URINALYSIS, ROUTINE W REFLEX MICROSCOPIC
Bilirubin Urine: NEGATIVE
Hgb urine dipstick: NEGATIVE
Ketones, ur: NEGATIVE mg/dL
Specific Gravity, Urine: 1.015 (ref 1.005–1.030)
Urobilinogen, UA: 0.2 mg/dL (ref 0.0–1.0)

## 2012-06-03 MED ORDER — SODIUM CHLORIDE 0.9 % IV BOLUS (SEPSIS)
1000.0000 mL | Freq: Once | INTRAVENOUS | Status: AC
  Administered 2012-06-03: 1000 mL via INTRAVENOUS

## 2012-06-03 MED ORDER — DOCUSATE SODIUM 100 MG PO CAPS: 100.0000 mg | ORAL_CAPSULE | Freq: Two times a day (BID) | ORAL | Status: AC

## 2012-06-03 MED ORDER — HYDROMORPHONE HCL PF 1 MG/ML IJ SOLN
1.0000 mg | Freq: Once | INTRAMUSCULAR | Status: AC
  Administered 2012-06-03: 1 mg via INTRAVENOUS
  Filled 2012-06-03: qty 1

## 2012-06-03 MED ORDER — POLYETHYLENE GLYCOL 3350 17 GM/SCOOP PO POWD: 17.0000 g | Freq: Two times a day (BID) | ORAL | Status: AC

## 2012-06-03 MED ORDER — IOHEXOL 300 MG/ML  SOLN: 40.0000 mL | Freq: Once | INTRAMUSCULAR | Status: DC | PRN

## 2012-06-03 MED ORDER — IOHEXOL 300 MG/ML  SOLN
100.0000 mL | Freq: Once | INTRAMUSCULAR | Status: AC | PRN
  Administered 2012-06-03: 100 mL via INTRAVENOUS

## 2012-06-03 NOTE — ED Notes (Signed)
Pt seen here on the 29th of June for lower abdominal pain, lower back pain, and dysuria. Pt c/o continued pain.

## 2012-06-03 NOTE — ED Provider Notes (Signed)
History     CSN: [id number]  Arrival date & time 06/03/12  0422   First MD Initiated Contact with Patient 06/03/12 [phone]      Chief Complaint  Patient presents with  . Abdominal Pain  . Back Pain  . Dysuria    (Consider location/radiation/quality/duration/timing/severity/associated sxs/prior treatment) HPI Comments: 21 year old female with a history of urinary tract infection in the past, bacterial vaginosis and Chlamydia in the past. She has no abdominal surgical history. She presents with approximately 10 days of persistent lower abdominal pain which was initially evaluated at the hospital with a urinalysis and an ultrasound which was unremarkable. Since that time she has taken hydrocodone intermittently without relief, and notes that her pain is persistent, does not seem to get worse with ambulation but is worse with palpation of the abdomen and urination. She denies fevers chills but has had vomiting twice over the last 3 days. She also has loose stools which are nonbloody. She has not followed up with her family doctor and has not had any pain medicine today prior to arrival.she denies vaginal discharge or vaginal bleeding.  Patient is a 21 y.o. female presenting with abdominal pain, back pain, and dysuria. The history is provided by the patient and medical records.  Abdominal Pain The primary symptoms of the illness include abdominal pain and dysuria.  Additional symptoms associated with the illness include back pain.  Back Pain  Associated symptoms include abdominal pain and dysuria.  Dysuria     Past Medical History  Diagnosis Date  . No pertinent past medical history   . Preterm labor without delivery in second trimester June 2012    Positive FFN in June  . UTI (lower urinary tract infection)   . Bacterial vaginosis   . Chlamydia     Past Surgical History  Procedure Date  . No past surgeries     History reviewed. No pertinent family history.  History  Substance  Use Topics  . Smoking status: Never Smoker   . Smokeless tobacco: Never Used  . Alcohol Use: No    OB History    Grav Para Term Preterm Abortions TAB SAB Ect Mult Living   1 1 1  0      1      [name]  Gastrointestinal: Positive for abdominal pain.  Genitourinary: Positive for dysuria.  Musculoskeletal: Positive for back pain.  All other systems reviewed and are negative.    Allergies  Review of patient's allergies indicates no known allergies.  Home Medications   Current Outpatient Rx  Name Route Sig Dispense Refill  . HYDROCODONE-ACETAMINOPHEN 5-325 MG PO TABS Oral Take 1 tablet by mouth every 6 (six) hours as needed for pain. 20 tablet 0  . NORETHINDRON-ETHINYL ESTRAD-FE 1-20/1-30/1-35 MG-MCG PO TABS Oral Take 1 tablet by mouth daily.    Marland Kitchen DOCUSATE SODIUM 100 MG PO CAPS Oral Take 1 capsule (100 mg total) by mouth every 12 (twelve) hours. 30 capsule 0  . POLYETHYLENE GLYCOL 3350 PO POWD Oral Take 17 g by mouth 2 (two) times daily. Until daily soft stools  OTC 255 g 0    BP 113/70  Pulse 71  Temp 98.6 F (37 C)  Resp 20  Ht 5\' 7"  (1.702 m)  Wt 268 lb (121.564 kg)  BMI 41.97 kg/m2  SpO2 98%  LMP 05/16/2012  Physical Exam  Nursing note and vitals reviewed. Constitutional: She appears well-developed and well-nourished. No distress.  HENT:  Head: Normocephalic and atraumatic.  Mouth/Throat: Oropharynx is clear and moist. No oropharyngeal exudate.  Eyes: Conjunctivae and EOM are normal. Pupils are equal, round, and reactive to light. Right eye exhibits no discharge. Left eye exhibits no discharge. No scleral icterus.  Neck: Normal range of motion. Neck supple. No JVD present. No thyromegaly present.  Cardiovascular: Normal rate, regular rhythm, normal heart sounds and intact distal pulses.  Exam reveals no gallop and no friction rub.   No murmur heard. Pulmonary/Chest: Effort normal and breath sounds normal. No respiratory distress. She has no wheezes. She  has no rales.  Abdominal: Soft. Bowel sounds are normal. She exhibits no distension and no mass. There is tenderness ( periumbilical tenderness, mild tenderness in the epigastrium and suprapubic area, non-peritoneal).  Musculoskeletal: Normal range of motion. She exhibits no edema and no tenderness.  Lymphadenopathy:    She has no cervical adenopathy.  Neurological: She is alert. Coordination normal.  Skin: Skin is warm and dry. No rash noted. No erythema.  Psychiatric: She has a normal mood and affect. Her behavior is normal.    ED Course  Procedures (including critical care time)  Labs Reviewed  CBC WITH DIFFERENTIAL - Abnormal; Notable for the following:    Hemoglobin 11.4 (*)     HCT 34.7 (*)     All other components within normal limits  HEPATIC FUNCTION PANEL - Abnormal; Notable for the following:    Albumin 3.3 (*)     All other components within normal limits  POCT I-STAT, CHEM 8 - Abnormal; Notable for the following:    Calcium, Ion 1.24 (*)     Hemoglobin 11.9 (*)     HCT 35.0 (*)     All other components within normal limits  URINALYSIS, ROUTINE W REFLEX MICROSCOPIC  POCT PREGNANCY, URINE  LIPASE, BLOOD   Ct Abdomen Pelvis W Contrast  06/03/2012  *RADIOLOGY REPORT*  Clinical Data: Low abdominal/back pain with dysuria.  CT ABDOMEN AND PELVIS WITH CONTRAST  Technique:  Multidetector CT imaging of the abdomen and pelvis was performed following the standard protocol during bolus administration of intravenous contrast.  Contrast: OMNIPAQUE IOHEXOL 300 MG/ML  SOLN  Comparison: Ultrasound of 05/26/2012.  Findings: Clear lung bases.  Normal heart size without pericardial or pleural effusion.  Normal liver, spleen, stomach, pancreas, gallbladder, biliary tract, adrenal glands.  Normal kidneys, without hydronephrosis or urinary tract calculi. Phleboliths in the pelvis.  Mildly prominent retroperitoneal nodes are likely reactive and not pathologic by size criteria.  No retrocrural  adenopathy.  Colonic stool burden suggests constipation.  Normal appendix on image 68.  Normal terminal ileum.  Normal small bowel.  Normal urinary bladder and uterus.  The left ovary is positioned within the central left pelvis on image 63 and is favored to contain a corpus luteal cyst; 1.8 cm on coronal image 50.  There is trace cul-de-sac fluid which is favored to be physiologic.  No acute osseous abnormality.  IMPRESSION:  1. Possible constipation. 2.  Probable left ovarian corpus luteal cyst. 3.  No other explanation for abdominal pain. 4.  Normal appendix.  Original Report Authenticated By: Consuello Bossier, M.D.     1. Constipation   2. Abdominal  pain, other specified site       MDM  The patient states that the pain does radiate to her back, would consider things such as diverticulitis as a possibility, patient has a non-peritoneal exam at this time, vital signs are normal, no tachycardia or fevers. Urinalysis is negative and  does not reveal any signs of urinary tract infection. We'll perform laboratory workup, CT scan, pain medication, fluids.  CT scan reviewed showing constipation but no other acute findings to suggest another etiology for the patient's pain.laboratory data reviewed showing no leukocytosis, normal blood counts, normal electrolytes, clear urinalysis and normal liver function tests. Patient will be discharged home with constipation medications.  Discharge Prescriptions include:  miralax Colace       Vida Roller, MD 06/03/12 [phone]

## 2012-06-18 ENCOUNTER — Emergency Department (HOSPITAL_COMMUNITY)
Admission: EM | Admit: 2012-06-18 | Discharge: 2012-06-18 | Disposition: A | Discharge status: Home / Self Care | Payer: Medicaid Other | Attending: Emergency Medicine | Admitting: Emergency Medicine

## 2012-06-18 ENCOUNTER — Encounter (HOSPITAL_COMMUNITY): Payer: Self-pay | Admitting: *Deleted

## 2012-06-18 DIAGNOSIS — N390 Urinary tract infection, site not specified: Secondary | ICD-10-CM | POA: Insufficient documentation

## 2012-06-18 DIAGNOSIS — R109 Unspecified abdominal pain: Secondary | ICD-10-CM

## 2012-06-18 LAB — BASIC METABOLIC PANEL
BUN: 11 mg/dL (ref 6–23)
CO2: 26 mEq/L (ref 19–32)
Chloride: 103 mEq/L (ref 96–112)
Creatinine, Ser: 1.04 mg/dL (ref 0.50–1.10)
GFR calc Af Amer: 90 mL/min — ABNORMAL LOW (ref >90)
Glucose, Bld: 84 mg/dL (ref 70–99)
Potassium: 3.6 mEq/L (ref 3.5–5.1)

## 2012-06-18 LAB — URINALYSIS, ROUTINE W REFLEX MICROSCOPIC
Glucose, UA: NEGATIVE mg/dL
Hgb urine dipstick: NEGATIVE
Leukocytes, UA: NEGATIVE
Protein, ur: NEGATIVE mg/dL
pH: 6.5 (ref 5.0–8.0)

## 2012-06-18 LAB — CBC
HCT: 34 % — ABNORMAL LOW (ref 36.0–46.0)
Hemoglobin: 11.1 g/dL — ABNORMAL LOW (ref 12.0–15.0)
MCV: 81.9 fL (ref 78.0–100.0)
RBC: 4.15 MIL/uL (ref 3.87–5.11)
WBC: 6.3 10*3/uL (ref 4.0–10.5)

## 2012-06-18 LAB — WET PREP, GENITAL
Trich, Wet Prep: NONE SEEN
Yeast Wet Prep HPF POC: NONE SEEN

## 2012-06-18 LAB — URINE MICROSCOPIC-ADD ON

## 2012-06-18 MED ORDER — CEFTRIAXONE SODIUM 1 G IJ SOLR
1.0000 g | Freq: Once | INTRAMUSCULAR | Status: AC
  Administered 2012-06-18: 1 g via INTRAMUSCULAR
  Filled 2012-06-18: qty 10

## 2012-06-18 MED ORDER — HYDROCODONE-ACETAMINOPHEN 5-325 MG PO TABS: 1.0000 | ORAL_TABLET | ORAL | Status: AC | PRN

## 2012-06-18 MED ORDER — CEPHALEXIN 500 MG PO CAPS: 500.0000 mg | ORAL_CAPSULE | Freq: Four times a day (QID) | ORAL | Status: AC

## 2012-06-18 NOTE — ED Notes (Signed)
Multiple problems, states she has been seen at [facility] and here for 3 weeks, states she thinks she is pregnant, c/o headache and breast tenderness, nausea and vomiting, requesting a blood test for pregnancy. Last normal LMP 05-06-2012

## 2012-06-18 NOTE — ED Provider Notes (Signed)
History     CSN: [id number]  Arrival date & time 06/18/12  5621   First MD Initiated Contact with Patient 06/18/12 2023      Chief Complaint  Patient presents with  . Abdominal Pain    (Consider location/radiation/quality/duration/timing/severity/associated sxs/prior treatment) The history is provided by the patient.   patient reports 3 weeks of mild lower abdominal pain and occasional lower back pain.  She denies unilateral flank pain.  She reports occasional nausea and has vomited one time.  She's had no vomiting in the past 24 hours.  She denies diarrhea.  She denies dysuria but does report urinary frequency.  She also reports new vaginal odor.  She's had no vaginal discharge.  Nothing worsens her symptoms.  Nothing improves her symptoms.  Her symptoms are constant mild in severity.  Past Medical History  Diagnosis Date  . No pertinent past medical history   . Preterm labor without delivery in second trimester June 2012    Positive FFN in June  . UTI (lower urinary tract infection)   . Bacterial vaginosis   . Chlamydia     Past Surgical History  Procedure Date  . No past surgeries     No family history on file.  History  Substance Use Topics  . Smoking status: Never Smoker   . Smokeless tobacco: Never Used  . Alcohol Use: No    OB History    Grav Para Term Preterm Abortions TAB SAB Ect Mult Living   1 1 1  0      1      [name]  Gastrointestinal: Positive for abdominal pain.  All other systems reviewed and are negative.    Allergies  Review of patient's allergies indicates no known allergies.  Home Medications   Current Outpatient Rx  Name Route Sig Dispense Refill  . CEPHALEXIN 500 MG PO CAPS Oral Take 1 capsule (500 mg total) by mouth 4 (four) times daily. 28 capsule 0  . HYDROCODONE-ACETAMINOPHEN 5-325 MG PO TABS Oral Take 1 tablet by mouth every 4 (four) hours as needed for pain. 8 tablet 0    BP 111/59  Pulse 69  Temp 98.5 F (36.9  C)  Resp 20  Ht 5\' 7"  (1.702 m)  Wt 263 lb (119.296 kg)  BMI 41.19 kg/m2  SpO2 100%  LMP 05/06/2012  Physical Exam  Nursing note and vitals reviewed. Constitutional: She is oriented to person, place, and time. She appears well-developed and well-nourished. No distress.  HENT:  Head: Normocephalic and atraumatic.  Eyes: EOM are normal.  Neck: Normal range of motion.  Cardiovascular: Normal rate, regular rhythm and normal heart sounds.   Pulmonary/Chest: Effort normal and breath sounds normal.  Abdominal: Soft. She exhibits no distension. There is no tenderness.  Genitourinary:       Normal external genitalia.  No cervical motion tenderness.  No cervical discharge.  No vaginal bleeding noted.  No adnexal masses the  Musculoskeletal: Normal range of motion.  Neurological: She is alert and oriented to person, place, and time.  Skin: Skin is warm and dry.  Psychiatric: She has a normal mood and affect. Judgment normal.    ED Course  Procedures (including critical care time)  Labs Reviewed  URINALYSIS, ROUTINE W REFLEX MICROSCOPIC - Abnormal; Notable for the following:    APPearance HAZY (*)     Nitrite POSITIVE (*)     All other components within normal limits  CBC - Abnormal; Notable for the following:  Hemoglobin 11.1 (*)     HCT 34.0 (*)     All other components within normal limits  BASIC METABOLIC PANEL - Abnormal; Notable for the following:    GFR calc non Af Amer 77 (*)     GFR calc Af Amer 90 (*)     All other components within normal limits  WET PREP, GENITAL - Abnormal; Notable for the following:    Clue Cells Wet Prep HPF POC FEW (*)     WBC, Wet Prep HPF POC FEW (*)     All other components within normal limits  URINE MICROSCOPIC-ADD ON - Abnormal; Notable for the following:    Bacteria, UA MANY (*)     All other components within normal limits  PREGNANCY, URINE  GC/CHLAMYDIA PROBE AMP, GENITAL   No results found.   1. Urinary tract infection   2.  Abdominal pain       I reviewed available ER/hospitalization records thought the EMR    MDM  We'll treat the patient's urinary tract infection.  IV Rocephin given in emergency department.  Home with Keflex a short course of pain medicine.  Close PCP followup.        Lyanne Co, MD 06/18/12 [phone]

## 2012-06-21 NOTE — ED Notes (Signed)
+  Chlamydia. Chart sent to EDP office for review. [employer]HS attached. 

## 2012-06-24 NOTE — ED Notes (Signed)
Chart returned from EDP office. Prescribed Azithromycin 500 mg. 2 tabs x 1 dose. #2. No refills. Prescribed by Lorenz Coaster PA-C. Attempted to contact patient. No answer. Left voicemail for patient to return call.

## 2012-06-24 NOTE — ED Notes (Signed)
Patient called back and was informed of +result and new Rx. Wants Rx called to [facility] in [place].

## 2012-07-26 ENCOUNTER — Encounter (HOSPITAL_COMMUNITY): Payer: Self-pay | Admitting: *Deleted

## 2012-07-26 ENCOUNTER — Emergency Department (HOSPITAL_COMMUNITY)
Admission: EM | Admit: 2012-07-26 | Discharge: 2012-07-27 | Disposition: A | Discharge status: Home / Self Care | Payer: Worker's Compensation | Attending: Emergency Medicine | Admitting: Emergency Medicine

## 2012-07-26 DIAGNOSIS — T63391A Toxic effect of venom of other spider, accidental (unintentional), initial encounter: Secondary | ICD-10-CM | POA: Insufficient documentation

## 2012-07-26 DIAGNOSIS — T63331A Toxic effect of venom of brown recluse spider, accidental (unintentional), initial encounter: Secondary | ICD-10-CM

## 2012-07-26 DIAGNOSIS — T6391XA Toxic effect of contact with unspecified venomous animal, accidental (unintentional), initial encounter: Secondary | ICD-10-CM | POA: Insufficient documentation

## 2012-07-26 LAB — POCT PREGNANCY, URINE: Preg Test, Ur: NEGATIVE

## 2012-07-26 MED ORDER — HYDROCODONE-ACETAMINOPHEN 5-325 MG PO TABS: 1.0000 | ORAL_TABLET | ORAL | Status: AC | PRN

## 2012-07-26 MED ORDER — IBUPROFEN 600 MG PO TABS: 600.0000 mg | ORAL_TABLET | Freq: Four times a day (QID) | ORAL | Status: DC | PRN

## 2012-07-26 MED ORDER — KETOROLAC TROMETHAMINE 60 MG/2ML IM SOLN
60.0000 mg | Freq: Once | INTRAMUSCULAR | Status: AC
  Administered 2012-07-27: 60 mg via INTRAMUSCULAR
  Filled 2012-07-26: qty 2

## 2012-07-26 MED ORDER — HYDROCODONE-ACETAMINOPHEN 5-325 MG PO TABS
1.0000 | ORAL_TABLET | Freq: Once | ORAL | Status: AC
  Administered 2012-07-26: 1 via ORAL
  Filled 2012-07-26: qty 1

## 2012-07-26 NOTE — ED Notes (Signed)
Patient given ice pack for right forearm.

## 2012-07-26 NOTE — ED Notes (Signed)
Pt noticed a spider on rt forearm, painful now.

## 2012-07-26 NOTE — ED Provider Notes (Signed)
History     CSN: [id number]  Arrival date & time 07/26/12  2204   First MD Initiated Contact with Patient 07/26/12 2217      Chief Complaint  Patient presents with  . 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  She is fairly confident the spider was a brown recluse which bit her on her right volar forearm.  She reports redness,  Swelling and burning at the site of the bite without radiation.  She denies numbness or tingling distal to the injury site and has had no nausea, vomiting , abdominal pain or muscle cramping.  She has taken no medications prior to arrival and has no significant past medical history.  The history is provided by the patient.    Past Medical History  Diagnosis Date  . No pertinent past medical history   . Preterm labor without delivery in second trimester June 2012    Positive FFN in June  . UTI (lower urinary tract infection)   . Bacterial vaginosis   . Chlamydia     Past Surgical History  Procedure Date  . No past surgeries     History reviewed. No pertinent family history.  History  Substance Use Topics  . Smoking status: Never Smoker   . Smokeless tobacco: Never Used  . Alcohol Use: No    OB History    Grav Para Term Preterm Abortions TAB SAB Ect Mult Living   1 1 1  0      1      [name]  Constitutional: Negative for fever and chills.  HENT: Negative for facial swelling.   Respiratory: Negative for shortness of breath and wheezing.   Skin: Positive for wound.  Neurological: Negative for numbness.    Allergies  Review of patient's allergies indicates no known allergies.  Home Medications   Current Outpatient Rx  Name Route Sig Dispense Refill  . HYDROCODONE-ACETAMINOPHEN 5-325 MG PO TABS Oral Take 1 tablet  by mouth every 4 (four) hours as needed for pain. 15 tablet 0  . IBUPROFEN 600 MG PO TABS Oral Take 1 tablet (600 mg total) by mouth every 6 (six) hours as needed for pain. 30 tablet 0    BP 134/73  Pulse 94  Temp 98.6 F (37 C) (Oral)  Resp 18  Ht 5\' 7"  (1.702 m)  Wt 260 lb (117.935 kg)  BMI 40.72 kg/m2  SpO2 100%  LMP 07/01/2012  Breastfeeding? No  Physical Exam  Constitutional: She appears well-developed and well-nourished. No distress.  HENT:  Head: Normocephalic.  Neck: Neck supple.  Cardiovascular: Normal rate.   Pulses:      Radial pulses are 2+ on the right side.  Pulmonary/Chest: Effort normal. She has no wheezes.  Musculoskeletal: Normal range of motion. She exhibits no edema.  Lymphadenopathy:       Right axillary: No pectoral and no lateral adenopathy present.  Skin: There is erythema.       Tiny puncture noted right volar forearm with 4 cm surrounding erythema and slight induration.  No central discoloration,  No drainage from wound, no red streaking.    ED Course  Procedures (including critical care time)   Labs Reviewed  POCT PREGNANCY, URINE   No results found.   1. Brown recluse spider bite       MDM  Patient prescribed ibuprofen,  Hydrocodone,  Encouraged ice packs to help minimize swelling.  She was advised to return if the redness continues to expand, if she develops fevers,  Abdominal pain, or if she develops central duskiness or ulceration at the bite site.  Pt understands plan and is agreeable.          Burgess Amor, [place] 07/27/12 [phone]

## 2012-07-26 NOTE — ED Notes (Signed)
J. Idol, PA at bedside. 

## 2012-07-27 NOTE — ED Provider Notes (Signed)
Medical screening examination/treatment/procedure(s) were performed by non-physician practitioner and as supervising physician I was immediately available for consultation/collaboration.   [name], MD 07/27/12 Barry Brunner

## 2012-08-02 ENCOUNTER — Encounter (HOSPITAL_COMMUNITY): Payer: Self-pay | Admitting: *Deleted

## 2012-08-02 ENCOUNTER — Emergency Department (HOSPITAL_COMMUNITY): Payer: Medicaid Other

## 2012-08-02 ENCOUNTER — Emergency Department (HOSPITAL_COMMUNITY)
Admission: EM | Admit: 2012-08-02 | Discharge: 2012-08-02 | Disposition: A | Discharge status: Home / Self Care | Payer: Medicaid Other | Attending: Emergency Medicine | Admitting: Emergency Medicine

## 2012-08-02 DIAGNOSIS — W19XXXA Unspecified fall, initial encounter: Secondary | ICD-10-CM | POA: Insufficient documentation

## 2012-08-02 DIAGNOSIS — S93409A Sprain of unspecified ligament of unspecified ankle, initial encounter: Secondary | ICD-10-CM | POA: Insufficient documentation

## 2012-08-02 DIAGNOSIS — Y92009 Unspecified place in unspecified non-institutional (private) residence as the place of occurrence of the external cause: Secondary | ICD-10-CM | POA: Insufficient documentation

## 2012-08-02 MED ORDER — IBUPROFEN 800 MG PO TABS
800.0000 mg | ORAL_TABLET | Freq: Once | ORAL | Status: AC
  Administered 2012-08-02: 800 mg via ORAL
  Filled 2012-08-02: qty 1

## 2012-08-02 MED ORDER — HYDROCODONE-ACETAMINOPHEN 5-325 MG PO TABS
1.0000 | ORAL_TABLET | Freq: Once | ORAL | Status: AC
  Administered 2012-08-02: 1 via ORAL
  Filled 2012-08-02: qty 1

## 2012-08-02 MED ORDER — IBUPROFEN 800 MG PO TABS: 800.0000 mg | ORAL_TABLET | Freq: Three times a day (TID) | ORAL | Status: AC

## 2012-08-02 MED ORDER — TRAMADOL HCL 50 MG PO TABS: 50.0000 mg | ORAL_TABLET | Freq: Four times a day (QID) | ORAL | Status: AC | PRN

## 2012-08-02 NOTE — ED Notes (Addendum)
Pt states leg gave out while walking down the stairs, now c/o left ankle pain. Pt denies hitting head.

## 2012-08-02 NOTE — ED Provider Notes (Signed)
History     CSN: [id number]  Arrival date & time 08/02/12  2033   First MD Initiated Contact with Patient 08/02/12 2110      Chief Complaint  Patient presents with  . Ankle Pain    (Consider location/radiation/quality/duration/timing/severity/associated sxs/prior treatment) Patient is a 21 y.o. female presenting with ankle pain. The history is provided by the patient.  Ankle Pain  Incident onset: several hrs prior to ED arrival. The incident occurred at home. The injury mechanism was a fall and torsion. The pain is present in the left ankle. The quality of the pain is described as aching. The pain is moderate. The pain has been constant since onset. Pertinent negatives include no numbness, no inability to bear weight, no loss of motion, no muscle weakness, no loss of sensation and no tingling. She reports no foreign bodies present. The symptoms are aggravated by activity, bearing weight and palpation. She has tried nothing for the symptoms. The treatment provided no relief.    Past Medical History  Diagnosis Date  . No pertinent past medical history   . Preterm labor without delivery in second trimester June 2012    Positive FFN in June  . UTI (lower urinary tract infection)   . Bacterial vaginosis   . Chlamydia     Past Surgical History  Procedure Date  . No past surgeries     History reviewed. No pertinent family history.  History  Substance Use Topics  . Smoking status: Never Smoker   . Smokeless tobacco: Never Used  . Alcohol Use: No    OB History    Grav Para Term Preterm Abortions TAB SAB Ect Mult Living   1 1 1  0      1      [name]  Constitutional: Negative for fever and chills.  Genitourinary: Negative for dysuria and difficulty urinating.  Musculoskeletal: Positive for joint swelling and arthralgias.  Skin: Negative for color change and wound.  Neurological: Negative for tingling and numbness.  All other systems reviewed and are  negative.    Allergies  Review of patient's allergies indicates no known allergies.  Home Medications   Current Outpatient Rx  Name Route Sig Dispense Refill  . HYDROCODONE-ACETAMINOPHEN 5-325 MG PO TABS Oral Take 1 tablet by mouth every 4 (four) hours as needed for pain. 15 tablet 0    BP 119/79  Pulse 88  Temp 99 F (37.2 C) (Oral)  Resp 20  Ht 5\' 7"  (1.702 m)  Wt 263 lb (119.296 kg)  BMI 41.19 kg/m2  SpO2 100%  LMP 07/11/2012  Physical Exam  Nursing note and vitals reviewed. Constitutional: She is oriented to person, place, and time. She appears well-developed and well-nourished. No distress.  HENT:  Head: Normocephalic and atraumatic.  Cardiovascular: Normal rate, regular rhythm, normal heart sounds and intact distal pulses.   Pulmonary/Chest: Effort normal and breath sounds normal.  Musculoskeletal: She exhibits tenderness.       Left ankle: She exhibits swelling. She exhibits normal range of motion, no ecchymosis, no deformity, no laceration and normal pulse. tenderness. Lateral malleolus tenderness found. No AITFL, no posterior TFL, no head of 5th metatarsal and no proximal fibula tenderness found. Achilles tendon normal.       Lateral left ankle is ttp, mild STS is present.  ROM is preserved.  DP pulse is brisk, sensation intact.  No erythema, abrasion, bruising or deformity.    Neurological: She is alert and oriented to person, place, and  time. She exhibits normal muscle tone. Coordination normal.  Skin: Skin is warm and dry.    ED Course  Procedures (including critical care time)  Labs Reviewed - No data to display Dg Ankle Complete Left  08/02/2012  *RADIOLOGY REPORT*  Clinical Data:  Ankle injury and pain.  LEFT ANKLE COMPLETE - 3+ VIEW  Comparison:  None.  Findings:  There is no evidence of fracture, dislocation, or joint effusion.  There is no evidence of arthropathy or other focal bone abnormality.  Soft tissues are unremarkable.  IMPRESSION: Negative.    Original Report Authenticated By: Danae Orleans, M.D.     ASO splint applied, pain improved, remains NV intact    MDM   difuse ttp of the left ankle w/o bruising, edema, abrasions or deformity.  No proximal tenderness.  Pt agrees to f/u with Dr. Romeo Apple   The patient appears reasonably screened and/or stabilized for discharge and I doubt any other medical condition or other [facility] requiring further screening, evaluation, or treatment in the ED at this time prior to discharge.      Valeska Haislip L. Ta Fair, [place] 08/05/12 1723

## 2012-08-08 NOTE — ED Provider Notes (Signed)
Medical screening examination/treatment/procedure(s) were performed by non-physician practitioner and as supervising physician I was immediately available for consultation/collaboration.   Kamarri Fischetti L Fleeta Kunde, MD 08/08/12 1028 

## 2012-09-09 ENCOUNTER — Emergency Department (HOSPITAL_COMMUNITY): Payer: Medicaid Other

## 2012-09-09 ENCOUNTER — Emergency Department (HOSPITAL_COMMUNITY)
Admission: EM | Admit: 2012-09-09 | Discharge: 2012-09-09 | Disposition: A | Discharge status: Home / Self Care | Payer: Medicaid Other | Attending: Emergency Medicine | Admitting: Emergency Medicine

## 2012-09-09 ENCOUNTER — Encounter (HOSPITAL_COMMUNITY): Payer: Self-pay

## 2012-09-09 DIAGNOSIS — R1110 Vomiting, unspecified: Secondary | ICD-10-CM | POA: Insufficient documentation

## 2012-09-09 DIAGNOSIS — R109 Unspecified abdominal pain: Secondary | ICD-10-CM

## 2012-09-09 DIAGNOSIS — Z3202 Encounter for pregnancy test, result negative: Secondary | ICD-10-CM | POA: Insufficient documentation

## 2012-09-09 DIAGNOSIS — J069 Acute upper respiratory infection, unspecified: Secondary | ICD-10-CM

## 2012-09-09 LAB — URINALYSIS, ROUTINE W REFLEX MICROSCOPIC
Glucose, UA: NEGATIVE mg/dL
Ketones, ur: NEGATIVE mg/dL
Leukocytes, UA: NEGATIVE
Protein, ur: NEGATIVE mg/dL

## 2012-09-09 NOTE — ED Notes (Signed)
Glenford Peers for one week with sorethroat, congestion, hoarse, coughing. Now lower abd/pelvic pain for 3 days. Pt also c/o vomiting several times a day for past week

## 2012-09-09 NOTE — ED Provider Notes (Signed)
History     CSN: [id number]  Arrival date & time 09/09/12  4098   First MD Initiated Contact with Patient 09/09/12 0155      Chief Complaint  Patient presents with  . Abdominal Pain    (Consider location/radiation/quality/duration/timing/severity/associated sxs/prior treatment) HPI Patricia [name] is a 21 y.o. female who presents to the Emergency Department complaining of lower abdominal pain x 3 days associated with vomiting x 1 week. Her period is late. She took a home pregnancy test yesterday which was positive. She has had a cough, sorethroat, congestion for a week and has been taking robitussin. The abdominal pain she has is across the lower abdomen and occasionally radiates to the RLQ. LMP was September and lasted only a few hours.   PCP Dr. Sudie Bailey  Past Medical History  Diagnosis Date  . No pertinent past medical history   . Preterm labor without delivery in second trimester June 2012    Positive FFN in June  . UTI (lower urinary tract infection)   . Bacterial vaginosis   . Chlamydia     Past Surgical History  Procedure Date  . No past surgeries     History reviewed. No pertinent family history.  History  Substance Use Topics  . Smoking status: Never Smoker   . Smokeless tobacco: Never Used  . Alcohol Use: No    OB History    Grav Para Term Preterm Abortions TAB SAB Ect Mult Living   1 1 1  0      1      [name]  Constitutional: Negative for fever.       10 Systems reviewed and are negative for acute change except as noted in the HPI.  HENT: Positive for congestion, sore throat, rhinorrhea and sneezing.   Eyes: Negative for discharge and redness.  Respiratory: Positive for cough. Negative for shortness of breath.   Cardiovascular: Negative for chest pain.  Gastrointestinal: Positive for abdominal pain. Negative for vomiting.  Musculoskeletal: Negative for back pain.  Skin: Negative for rash.  Neurological: Negative for syncope, numbness  and headaches.  Psychiatric/Behavioral:       No behavior change.    Allergies  Review of patient's allergies indicates no known allergies.  Home Medications  No current outpatient prescriptions on file.  BP 121/66  Pulse 92  Temp 99 F (37.2 C) (Oral)  Resp 18  Ht 5\' 7"  (1.702 m)  Wt 286 lb (129.729 kg)  BMI 44.79 kg/m2  SpO2 99%  LMP 08/10/2012  Physical Exam  Nursing note and vitals reviewed. Constitutional:       Awake, alert, nontoxic appearance.  HENT:  Head: Normocephalic and atraumatic.  Right Ear: External ear normal.  Left Ear: External ear normal.  Mouth/Throat: Oropharynx is clear and moist.  Eyes: EOM are normal. Pupils are equal, round, and reactive to light. Right eye exhibits no discharge. Left eye exhibits no discharge.  Neck: Normal range of motion. Neck supple.  Cardiovascular: Normal heart sounds.   Pulmonary/Chest: Effort normal and breath sounds normal. She exhibits no tenderness.  Abdominal: Soft. She exhibits no distension. There is tenderness. There is no rebound and no guarding.       Mild suprapubic tenderness to palpation  Genitourinary:       No cva tenderness  Musculoskeletal: She exhibits no tenderness.       Baseline ROM, no obvious new focal weakness.  Neurological:       Mental status and motor strength appears  baseline for patient and situation.  Skin: No rash noted.  Psychiatric: She has a normal mood and affect.    ED Course  Procedures (including critical care time) Results for orders placed during the hospital encounter of 09/09/12  URINALYSIS, ROUTINE W REFLEX MICROSCOPIC      Component Value Range   Color, Urine YELLOW  YELLOW   APPearance CLEAR  CLEAR   Specific Gravity, Urine 1.015  1.005 - 1.030   pH 5.5  5.0 - 8.0   Glucose, UA NEGATIVE  NEGATIVE mg/dL   Hgb urine dipstick NEGATIVE  NEGATIVE   Bilirubin Urine NEGATIVE  NEGATIVE   Ketones, ur NEGATIVE  NEGATIVE mg/dL   Protein, ur NEGATIVE  NEGATIVE mg/dL    Urobilinogen, UA 0.2  0.0 - 1.0 mg/dL   Nitrite NEGATIVE  NEGATIVE   Leukocytes, UA NEGATIVE  NEGATIVE  PREGNANCY, URINE      Component Value Range   Preg Test, Ur NEGATIVE  NEGATIVE    No results found.   MDM  Patient with lower abdominal pain concerned she may be pregnant having taken a home pregnancy test that was positive. Urine was clean and urine pregnancy negative. Acute abdominal series with stool and gas only. Reviewed results with the patient.  Pt feels improved after observation and/or treatment in ED.Pt stable in ED with no significant deterioration in condition.The patient appears reasonably screened and/or stabilized for discharge and I doubt any other medical condition or other [facility] requiring further screening, evaluation, or treatment in the ED at this time prior to discharge.  MDM Reviewed: nursing note and vitals Interpretation: labs and x-ray               Nicoletta Dress. Colon Branch, MD 09/09/12 [phone]

## 2012-09-25 LAB — CYSTIC FIBROSIS DIAGNOSTIC STUDY: Interpretation-CFDNA:: NEGATIVE

## 2012-09-25 LAB — OB RESULTS CONSOLE ANTIBODY SCREEN: Antibody Screen: NEGATIVE

## 2012-09-25 LAB — OB RESULTS CONSOLE RPR: RPR: NONREACTIVE

## 2012-11-10 ENCOUNTER — Emergency Department (HOSPITAL_COMMUNITY)
Admission: EM | Admit: 2012-11-10 | Discharge: 2012-11-10 | Disposition: A | Discharge status: Home / Self Care | Payer: Medicaid Other | Attending: Emergency Medicine | Admitting: Emergency Medicine

## 2012-11-10 ENCOUNTER — Encounter (HOSPITAL_COMMUNITY): Payer: Self-pay | Admitting: Emergency Medicine

## 2012-11-10 DIAGNOSIS — M545 Low back pain, unspecified: Secondary | ICD-10-CM | POA: Insufficient documentation

## 2012-11-10 DIAGNOSIS — B9689 Other specified bacterial agents as the cause of diseases classified elsewhere: Secondary | ICD-10-CM

## 2012-11-10 DIAGNOSIS — N760 Acute vaginitis: Secondary | ICD-10-CM | POA: Insufficient documentation

## 2012-11-10 DIAGNOSIS — Z87440 Personal history of urinary (tract) infections: Secondary | ICD-10-CM | POA: Insufficient documentation

## 2012-11-10 DIAGNOSIS — J3489 Other specified disorders of nose and nasal sinuses: Secondary | ICD-10-CM | POA: Insufficient documentation

## 2012-11-10 LAB — URINALYSIS, ROUTINE W REFLEX MICROSCOPIC
Bilirubin Urine: NEGATIVE
Hgb urine dipstick: NEGATIVE
Specific Gravity, Urine: 1.015 (ref 1.005–1.030)
Urobilinogen, UA: 0.2 mg/dL (ref 0.0–1.0)

## 2012-11-10 LAB — CBC WITH DIFFERENTIAL/PLATELET
Eosinophils Absolute: 0.3 10*3/uL (ref 0.0–0.7)
Eosinophils Relative: 5 % (ref 0–5)
Hemoglobin: 11 g/dL — ABNORMAL LOW (ref 12.0–15.0)
Lymphs Abs: 1.8 10*3/uL (ref 0.7–4.0)
MCH: 26.9 pg (ref 26.0–34.0)
MCV: 80.2 fL (ref 78.0–100.0)
Monocytes Absolute: 0.4 10*3/uL (ref 0.1–1.0)
Monocytes Relative: 6 % (ref 3–12)
Platelets: 186 10*3/uL (ref 150–400)
RBC: 4.09 MIL/uL (ref 3.87–5.11)

## 2012-11-10 LAB — BASIC METABOLIC PANEL
BUN: 3 mg/dL — ABNORMAL LOW (ref 6–23)
Calcium: 8.9 mg/dL (ref 8.4–10.5)
Creatinine, Ser: 0.71 mg/dL (ref 0.50–1.10)
GFR calc non Af Amer: >90 mL/min (ref >90)
Glucose, Bld: 101 mg/dL — ABNORMAL HIGH (ref 70–99)

## 2012-11-10 LAB — WET PREP, GENITAL

## 2012-11-10 MED ORDER — METRONIDAZOLE 500 MG PO TABS: 500.0000 mg | ORAL_TABLET | Freq: Two times a day (BID) | ORAL | Status: DC

## 2012-11-10 MED ORDER — SODIUM CHLORIDE 0.9 % IV BOLUS (SEPSIS)
1000.0000 mL | Freq: Once | INTRAVENOUS | Status: AC
  Administered 2012-11-10: 1000 mL via INTRAVENOUS

## 2012-11-10 NOTE — ED Provider Notes (Signed)
History     CSN: [id number]  Arrival date & time 11/10/12  1613   First MD Initiated Contact with Patient 11/10/12 1653      Chief Complaint  Patient presents with  . Abdominal Pain  . Back Pain  . head congestion     (Consider location/radiation/quality/duration/timing/severity/associated sxs/prior treatment) HPI Patient with back pain for two weeks and now with right flank pain for two days with associated nasal congestion, sore throat.    Patient pregnant with lmp 9/14.  G2p1,  Ob Dr. Emelda Fear, ultrasound done at 6 weeks at office.  Patient denies uti symptoms, no fever, or vomiting.  No trauma to area.  Pain is mild and diffuse to low back area. It is "sore" and nonradiating.  It increases with movement and palpation.  Past Medical History  Diagnosis Date  . No pertinent past medical history   . Preterm labor without delivery in second trimester June 2012    Positive FFN in June  . UTI (lower urinary tract infection)   . Bacterial vaginosis   . Chlamydia     Past Surgical History  Procedure Date  . No past surgeries     History reviewed. No pertinent family history.  History  Substance Use Topics  . Smoking status: Never Smoker   . Smokeless tobacco: Never Used  . Alcohol Use: No    OB History    Grav Para Term Preterm Abortions TAB SAB Ect Mult Living   2 1 1  0      1      [name]  Constitutional: Negative for fever, chills, activity change, appetite change and unexpected weight change.  HENT: Negative for sore throat, rhinorrhea, neck pain, neck stiffness and sinus pressure.   Eyes: Negative for visual disturbance.  Respiratory: Negative for cough and shortness of breath.   Cardiovascular: Negative for chest pain and leg swelling.  Gastrointestinal: Negative for vomiting, abdominal pain, diarrhea and blood in stool.  Genitourinary: Negative for dysuria, urgency, frequency, vaginal discharge and difficulty urinating.  Musculoskeletal: Negative  for myalgias, arthralgias and gait problem.  Skin: Negative for color change and rash.  Neurological: Negative for weakness, light-headedness and headaches.  Hematological: Does not bruise/bleed easily.  Psychiatric/Behavioral: Negative for dysphoric mood.    Allergies  Review of patient's allergies indicates no known allergies.  Home Medications  No current outpatient prescriptions on file.  BP 131/76  Pulse 116  Temp 98.4 F (36.9 C) (Oral)  Resp 24  Ht 5\' 7"  (1.702 m)  Wt 298 lb (135.172 kg)  BMI 46.67 kg/m2  SpO2 100%  LMP 08/10/2012  Physical Exam  Nursing note and vitals reviewed. Constitutional: She appears well-developed and well-nourished.  HENT:  Head: Normocephalic and atraumatic.  Eyes: Conjunctivae normal and EOM are normal. Pupils are equal, round, and reactive to light.  Neck: Normal range of motion. Neck supple.  Cardiovascular: Normal rate, regular rhythm, normal heart sounds and intact distal pulses.   Pulmonary/Chest: Effort normal and breath sounds normal.  Abdominal: Soft. Bowel sounds are normal.  Genitourinary: Vagina normal.       Urine enlargement consistent with dates no cervical motion tenderness no uterine tenderness.  Musculoskeletal: Normal range of motion.  Neurological: She is alert.  Skin: Skin is warm and dry.  Psychiatric: She has a normal mood and affect. Thought content normal.    ED Course  Procedures (including critical care time)  Labs Reviewed - No data to display No results found.  No diagnosis found.  Results for orders placed during the hospital encounter of 11/10/12  URINALYSIS, ROUTINE W REFLEX MICROSCOPIC      Component Value Range   Color, Urine YELLOW  YELLOW   APPearance CLEAR  CLEAR   Specific Gravity, Urine 1.015  1.005 - 1.030   pH 6.5  5.0 - 8.0   Glucose, UA NEGATIVE  NEGATIVE mg/dL   Hgb urine dipstick NEGATIVE  NEGATIVE   Bilirubin Urine NEGATIVE  NEGATIVE   Ketones, ur NEGATIVE  NEGATIVE mg/dL    Protein, ur NEGATIVE  NEGATIVE mg/dL   Urobilinogen, UA 0.2  0.0 - 1.0 mg/dL   Nitrite NEGATIVE  NEGATIVE   Leukocytes, UA NEGATIVE  NEGATIVE  CBC WITH DIFFERENTIAL      Component Value Range   WBC 6.4  4.0 - 10.5 K/uL   RBC 4.09  3.87 - 5.11 MIL/uL   Hemoglobin 11.0 (*) 12.0 - 15.0 g/dL   HCT 16.1 (*) 09.6 - 04.5 %   MCV 80.2  78.0 - 100.0 fL   MCH 26.9  26.0 - 34.0 pg   MCHC 33.5  30.0 - 36.0 g/dL   RDW 40.9  81.1 - 91.4 %   Platelets 186  150 - 400 K/uL   Neutrophils Relative 60  43 - 77 %   Neutro Abs 3.9  1.7 - 7.7 K/uL   Lymphocytes Relative 28  12 - 46 %   Lymphs Abs 1.8  0.7 - 4.0 K/uL   Monocytes Relative 6  3 - 12 %   Monocytes Absolute 0.4  0.1 - 1.0 K/uL   Eosinophils Relative 5  0 - 5 %   Eosinophils Absolute 0.3  0.0 - 0.7 K/uL   Basophils Relative 0  0 - 1 %   Basophils Absolute 0.0  0.0 - 0.1 K/uL  BASIC METABOLIC PANEL      Component Value Range   Sodium 138  135 - 145 mEq/L   Potassium 3.3 (*) 3.5 - 5.1 mEq/L   Chloride 107  96 - 112 mEq/L   CO2 23  19 - 32 mEq/L   Glucose, Bld 101 (*) 70 - 99 mg/dL   BUN 3 (*) 6 - 23 mg/dL   Creatinine, Ser 7.82  0.50 - 1.10 mg/dL   Calcium 8.9  8.4 - 95.6 mg/dL   GFR calc non Af Amer >90  >90 mL/min   GFR calc Af Amer >90  >90 mL/min  WET PREP, GENITAL      Component Value Range   Yeast Wet Prep HPF POC NONE SEEN  NONE SEEN   Trich, Wet Prep NONE SEEN  NONE SEEN   Clue Cells Wet Prep HPF POC FEW (*) NONE SEEN   WBC, Wet Prep HPF POC MODERATE (*) NONE SEEN     MDM  Patient was a complaint of low back pain and head congestion. She is 3 months pregnant has had a previous ultrasound showed an intrauterine pregnancy.    She received IV fluids here. Initial heart rate was 116 but has decreased to 90.  Patient with wet prep which is consistent with bacterial vaginosis and will be treated with Flagyl.  Hilario Quarry, MD 11/12/12 [phone]

## 2012-11-10 NOTE — ED Notes (Signed)
Pt c/o back/abd pain since Friday and head congestion since yesterday.

## 2012-11-13 ENCOUNTER — Telehealth (HOSPITAL_COMMUNITY): Payer: Self-pay | Admitting: Emergency Medicine

## 2012-11-13 NOTE — ED Notes (Signed)
Results received from [facility].  (+) Chlamydia   No antibiotic treatment or prescription given for STD.  Chart to MD office for review.  DHHS form attached. 

## 2012-11-16 ENCOUNTER — Telehealth (HOSPITAL_COMMUNITY): Payer: Self-pay | Admitting: Emergency Medicine

## 2012-11-16 NOTE — ED Notes (Signed)
Rx called in to [facility] in [place] by Norm Parcel PFM.

## 2012-11-16 NOTE — ED Notes (Signed)
Chart returned from EDP office. Prescribed Azithromycin 1 gram PO x 1. Prescribed by David Smith NP. °

## 2012-11-27 NOTE — L&D Delivery Note (Signed)
I was present for delivery and agree with above resident note.  Napoleon Form, MD

## 2012-11-27 NOTE — L&D Delivery Note (Signed)
Delivery Note At 11:00 PM a viable female was delivered via Vaginal, Spontaneous Delivery (Presentation: Left Occiput Anterior).  APGAR: 8, 9; weight .   Placenta status: Intact, Spontaneous.  Cord: 3 vessels with the following complications: None.  Cord pH: N/A  Anesthesia: Epidural  Episiotomy: None Lacerations: None Est. Blood Loss (mL): 200  Mom to postpartum.  Baby to nursery-stable.  Everlene Other 04/20/2013, 11:16 PM

## 2012-12-21 ENCOUNTER — Encounter (HOSPITAL_COMMUNITY): Payer: Self-pay | Admitting: Obstetrics and Gynecology

## 2012-12-21 ENCOUNTER — Inpatient Hospital Stay (HOSPITAL_COMMUNITY)
Admission: AD | Admit: 2012-12-21 | Discharge: 2012-12-21 | Disposition: A | Discharge status: Home / Self Care | Payer: Medicaid Other | Source: Ambulatory Visit | Attending: Obstetrics and Gynecology | Admitting: Obstetrics and Gynecology

## 2012-12-21 DIAGNOSIS — O2390 Unspecified genitourinary tract infection in pregnancy, unspecified trimester: Secondary | ICD-10-CM | POA: Insufficient documentation

## 2012-12-21 DIAGNOSIS — O210 Mild hyperemesis gravidarum: Secondary | ICD-10-CM | POA: Insufficient documentation

## 2012-12-21 DIAGNOSIS — O2340 Unspecified infection of urinary tract in pregnancy, unspecified trimester: Secondary | ICD-10-CM

## 2012-12-21 DIAGNOSIS — O219 Vomiting of pregnancy, unspecified: Secondary | ICD-10-CM

## 2012-12-21 DIAGNOSIS — N390 Urinary tract infection, site not specified: Secondary | ICD-10-CM | POA: Insufficient documentation

## 2012-12-21 DIAGNOSIS — R109 Unspecified abdominal pain: Secondary | ICD-10-CM | POA: Insufficient documentation

## 2012-12-21 DIAGNOSIS — M549 Dorsalgia, unspecified: Secondary | ICD-10-CM

## 2012-12-21 LAB — CBC WITH DIFFERENTIAL/PLATELET
Basophils Absolute: 0 10*3/uL (ref 0.0–0.1)
Basophils Relative: 0 % (ref 0–1)
Eosinophils Absolute: 0 10*3/uL (ref 0.0–0.7)
Hemoglobin: 11.2 g/dL — ABNORMAL LOW (ref 12.0–15.0)
MCHC: 33.2 g/dL (ref 30.0–36.0)
Monocytes Relative: 7 % (ref 3–12)
Neutro Abs: 3.9 10*3/uL (ref 1.7–7.7)
Neutrophils Relative %: 72 % (ref 43–77)
Platelets: 184 10*3/uL (ref 150–400)
RBC: 4.17 MIL/uL (ref 3.87–5.11)

## 2012-12-21 LAB — URINALYSIS, ROUTINE W REFLEX MICROSCOPIC
Nitrite: POSITIVE — AB
Specific Gravity, Urine: 1.025 (ref 1.005–1.030)
Urobilinogen, UA: >8 mg/dL — ABNORMAL HIGH (ref 0.0–1.0)
pH: 6 (ref 5.0–8.0)

## 2012-12-21 LAB — URINE MICROSCOPIC-ADD ON

## 2012-12-21 MED ORDER — CEPHALEXIN 500 MG PO CAPS: 500.0000 mg | ORAL_CAPSULE | Freq: Three times a day (TID) | ORAL | Status: DC

## 2012-12-21 MED ORDER — ONDANSETRON 8 MG PO TBDP: 8.0000 mg | ORAL_TABLET | Freq: Once | ORAL | Status: DC

## 2012-12-21 MED ORDER — CEPHALEXIN 500 MG PO CAPS
500.0000 mg | ORAL_CAPSULE | Freq: Once | ORAL | Status: AC
  Administered 2012-12-21: 500 mg via ORAL
  Filled 2012-12-21: qty 1

## 2012-12-21 MED ORDER — ONDANSETRON 8 MG PO TBDP
8.0000 mg | ORAL_TABLET | Freq: Once | ORAL | Status: AC
  Administered 2012-12-21: 8 mg via ORAL
  Filled 2012-12-21: qty 1

## 2012-12-21 NOTE — MAU Provider Note (Signed)
Attestation of Attending Supervision of Advanced Practitioner (CNM/NP): Evaluation and management procedures were performed by the Advanced Practitioner under my supervision and collaboration.  I have reviewed the Advanced Practitioner's note and chart, and I agree with the management and plan.  Sriyan Cutting 12/21/2012 9:17 PM

## 2012-12-21 NOTE — MAU Provider Note (Signed)
History     CSN: [id number]  Arrival date and time: 12/21/12 1746   None     Chief Complaint  Patient presents with  . Back Pain  . Abdominal Pain  . Headache  . Hematuria  . Emesis During Pregnancy   HPI GILBERTO STRECK is 22 y.o. G2P1001 [redacted]w[redacted]d weeks presenting with nausea and vomiting that began yesterday.  Unable to eat since last night.  Tried crackers and ginger today but that did not stay down.  Complains of abdominal and back pain.  Denies fever, chills.  Denies exposure to illness.  Did not urinate for a long time and then when she did, she saw blood in her urine.  Also has headache.  Hx of headache only with previous pregnancy--dilated at 27 weeks, preterm labor but carried until 38 weeks.  Has not been able to take anything for it.  Patient of Dr. Rayna Sexton.  Denies vaginal bleeding, leaking, or contractions.  Had ultrasound in Dec in the office.      Past Medical History  Diagnosis Date  . No pertinent past medical history   . Preterm labor without delivery in second trimester June 2012    Positive FFN in June  . UTI (lower urinary tract infection)   . Bacterial vaginosis   . Chlamydia     Past Surgical History  Procedure Date  . No past surgeries     History reviewed. No pertinent family history.  History  Substance Use Topics  . Smoking status: Never Smoker   . Smokeless tobacco: Never Used  . Alcohol Use: No    Allergies: No Known Allergies  Prescriptions prior to admission  Medication Sig Dispense Refill  . Prenatal Vit-Fe Fumarate-FA (PRENATAL MULTIVITAMIN) TABS Take 1 tablet by mouth daily.        [name]  Constitutional: Negative for fever and chills.  Gastrointestinal: Positive for nausea, vomiting and abdominal pain. Negative for diarrhea and constipation.  Musculoskeletal: Positive for back pain.  Neurological: Positive for headaches.   Physical Exam   Blood pressure 103/52, temperature 98.7 F (37.1 C), temperature source  Oral, resp. rate 18, height 5\' 8"  (1.727 m), weight 284 lb (128.822 kg), last menstrual period 08/10/2012, unknown if currently breastfeeding.  Physical Exam  Constitutional: She is oriented to person, place, and time. She appears well-developed and well-nourished. No distress.  HENT:  Head: Normocephalic.  Neck: Normal range of motion.  Cardiovascular: Normal rate.   Respiratory: Effort normal.  GI: Soft. She exhibits no distension and no mass. There is tenderness (mild). There is no rebound, no guarding and no CVA tenderness.  Genitourinary:       Dilation: Closed Effacement (%): Thick (LONG) Exam by:: Mayer Camel, NP  Neurological: She is alert and oriented to person, place, and time.  Skin: Skin is warm and dry.  Psychiatric: She has a normal mood and affect. Her behavior is normal.   Results for orders placed during the hospital encounter of 12/21/12 (from the past 24 hour(s))  URINALYSIS, ROUTINE W REFLEX MICROSCOPIC     Status: Abnormal   Collection Time   12/21/12  6:05 PM      Component Value Range   Color, Urine YELLOW  YELLOW   APPearance CLEAR  CLEAR   Specific Gravity, Urine 1.025  1.005 - 1.030   pH 6.0  5.0 - 8.0   Glucose, UA NEGATIVE  NEGATIVE mg/dL   Hgb urine dipstick NEGATIVE  NEGATIVE   Bilirubin Urine SMALL (*)  NEGATIVE   Ketones, ur 15 (*) NEGATIVE mg/dL   Protein, ur NEGATIVE  NEGATIVE mg/dL   Urobilinogen, UA >0.4 (*) 0.0 - 1.0 mg/dL   Nitrite POSITIVE (*) NEGATIVE   Leukocytes, UA TRACE (*) NEGATIVE  URINE MICROSCOPIC-ADD ON     Status: Abnormal   Collection Time   12/21/12  6:05 PM      Component Value Range   Squamous Epithelial / LPF FEW (*) RARE   WBC, UA 3-6  <3 WBC/hpf   RBC / HPF 0-2  <3 RBC/hpf   Bacteria, UA MANY (*) RARE  POCT PREGNANCY, URINE     Status: Abnormal   Collection Time   12/21/12  6:21 PM      Component Value Range   Preg Test, Ur POSITIVE (*) NEGATIVE  CBC WITH DIFFERENTIAL     Status: Abnormal   Collection Time   12/21/12   6:54 PM      Component Value Range   WBC 5.5  4.0 - 10.5 K/uL   RBC 4.17  3.87 - 5.11 MIL/uL   Hemoglobin 11.2 (*) 12.0 - 15.0 g/dL   HCT 54.0 (*) 98.1 - 19.1 %   MCV 80.8  78.0 - 100.0 fL   MCH 26.9  26.0 - 34.0 pg   MCHC 33.2  30.0 - 36.0 g/dL   RDW 47.8  29.5 - 62.1 %   Platelets 184  150 - 400 K/uL   Neutrophils Relative 72  43 - 77 %   Neutro Abs 3.9  1.7 - 7.7 K/uL   Lymphocytes Relative 21  12 - 46 %   Lymphs Abs 1.1  0.7 - 4.0 K/uL   Monocytes Relative 7  3 - 12 %   Monocytes Absolute 0.4  0.1 - 1.0 K/uL   Eosinophils Relative 0  0 - 5 %   Eosinophils Absolute 0.0  0.0 - 0.7 K/uL   Basophils Relative 0  0 - 1 %   Basophils Absolute 0.0  0.0 - 0.1 K/uL   MAU Course  Procedures  Urine culture pending  MDM Bedside ultrasound + for fetal viability and movement. Keflex 500mg  po and Zofran 8mg  ODT given in MAU  Assessment and Plan  A:  Nausea and vomiting in pregnancy at [redacted] weeks gestation      Back and abdominal pain      Urinary Tract infection  P:  Keflex 500mg  tid X 1week     Phenergan 12.5mg  po prn      Increase POfluids     Keep scheduled appointment with Dr. Jenean Lindau 12/21/2012, 6:48 PM

## 2012-12-21 NOTE — MAU Note (Signed)
Pt complains of back pain, abdominal pain, headache , vomiting, and blood in her urine. Pt states all of this started last night.

## 2012-12-21 NOTE — MAU Note (Signed)
PT DENIES CRAMPING, NO PAIN, NO VAG BLEEDING

## 2012-12-23 LAB — URINE CULTURE
Colony Count: NO GROWTH
Culture: NO GROWTH

## 2013-02-04 ENCOUNTER — Encounter: Payer: Self-pay | Admitting: *Deleted

## 2013-02-20 ENCOUNTER — Telehealth: Payer: Self-pay | Admitting: Obstetrics & Gynecology

## 2013-02-20 NOTE — Telephone Encounter (Signed)
Normal part of pregnancy, pt has follow up appt. Next Tuesday, February 25, 2013.

## 2013-02-25 ENCOUNTER — Other Ambulatory Visit: Payer: Medicaid Other

## 2013-02-25 ENCOUNTER — Ambulatory Visit (INDEPENDENT_AMBULATORY_CARE_PROVIDER_SITE_OTHER): Payer: Medicaid Other | Admitting: Obstetrics & Gynecology

## 2013-02-25 ENCOUNTER — Encounter: Payer: Self-pay | Admitting: Obstetrics & Gynecology

## 2013-02-25 VITALS — BP 112/70 | Wt 293.0 lb

## 2013-02-25 DIAGNOSIS — Z3480 Encounter for supervision of other normal pregnancy, unspecified trimester: Secondary | ICD-10-CM | POA: Insufficient documentation

## 2013-02-25 DIAGNOSIS — Z6791 Unspecified blood type, Rh negative: Secondary | ICD-10-CM | POA: Insufficient documentation

## 2013-02-25 DIAGNOSIS — Z331 Pregnant state, incidental: Secondary | ICD-10-CM

## 2013-02-25 DIAGNOSIS — Z8632 Personal history of gestational diabetes: Secondary | ICD-10-CM

## 2013-02-25 DIAGNOSIS — O360190 Maternal care for anti-D [Rh] antibodies, unspecified trimester, not applicable or unspecified: Secondary | ICD-10-CM

## 2013-02-25 DIAGNOSIS — O99019 Anemia complicating pregnancy, unspecified trimester: Secondary | ICD-10-CM

## 2013-02-25 DIAGNOSIS — O360990 Maternal care for other rhesus isoimmunization, unspecified trimester, not applicable or unspecified: Secondary | ICD-10-CM

## 2013-02-25 DIAGNOSIS — Z3482 Encounter for supervision of other normal pregnancy, second trimester: Secondary | ICD-10-CM

## 2013-02-25 DIAGNOSIS — Z1389 Encounter for screening for other disorder: Secondary | ICD-10-CM

## 2013-02-25 DIAGNOSIS — O99210 Obesity complicating pregnancy, unspecified trimester: Secondary | ICD-10-CM

## 2013-02-25 LAB — CBC
Hemoglobin: 10.9 g/dL — ABNORMAL LOW (ref 12.0–15.0)
MCHC: 33.2 g/dL (ref 30.0–36.0)
RDW: 14.6 % (ref 11.5–15.5)

## 2013-02-25 LAB — POCT URINALYSIS DIPSTICK
Glucose, UA: NEGATIVE
Ketones, UA: NEGATIVE

## 2013-02-25 NOTE — Patient Instructions (Signed)
Pregnancy - Third Trimester  The third trimester of pregnancy (the last 3 months) is a period of the most rapid growth for you and your baby. The baby approaches a length of 20 inches and a weight of 6 to 10 pounds. The baby is adding on fat and getting ready for life outside your body. While inside, babies have periods of sleeping and waking, suck their thumbs, and hiccups. You can often feel small contractions of the uterus. This is false labor. It is also called Braxton-Hicks contractions. This is like a practice for labor. The usual problems in this stage of pregnancy include more difficulty breathing, swelling of the hands and feet from water retention, and having to urinate more often because of the uterus and baby pressing on your bladder.   PRENATAL EXAMS  · Blood work may continue to be done during prenatal exams. These tests are done to check on your health and the probable health of your baby. Blood work is used to follow your blood levels (hemoglobin). Anemia (low hemoglobin) is common during pregnancy. Iron and vitamins are given to help prevent this. You may also continue to be checked for diabetes. Some of the past blood tests may be done again.  · The size of the uterus is measured during each visit. This makes sure your baby is growing properly according to your pregnancy dates.  · Your blood pressure is checked every prenatal visit. This is to make sure you are not getting toxemia.  · Your urine is checked every prenatal visit for infection, diabetes and protein.  · Your weight is checked at each visit. This is done to make sure gains are happening at the suggested rate and that you and your baby are growing normally.  · Sometimes, an ultrasound is performed to confirm the position and the proper growth and development of the baby. This is a test done that bounces harmless sound waves off the baby so your caregiver can more accurately determine due dates.  · Discuss the type of pain medication and  anesthesia you will have during your labor and delivery.  · Discuss the possibility and anesthesia if a Cesarean Section might be necessary.  · Inform your caregiver if there is any mental or physical violence at home.  Sometimes, a specialized non-stress test, contraction stress test and biophysical profile are done to make sure the baby is not having a problem. Checking the amniotic fluid surrounding the baby is called an amniocentesis. The amniotic fluid is removed by sticking a needle into the belly (abdomen). This is sometimes done near the end of pregnancy if an early delivery is required. In this case, it is done to help make sure the baby's lungs are mature enough for the baby to live outside of the womb. If the lungs are not mature and it is unsafe to deliver the baby, an injection of cortisone medication is given to the mother 1 to 2 days before the delivery. This helps the baby's lungs mature and makes it safer to deliver the baby.  CHANGES OCCURING IN THE THIRD TRIMESTER OF PREGNANCY  Your body goes through many changes during pregnancy. They vary from person to person. Talk to your caregiver about changes you notice and are concerned about.  · During the last trimester, you have probably had an increase in your appetite. It is normal to have cravings for certain foods. This varies from person to person and pregnancy to pregnancy.  · You may begin to   get stretch marks on your hips, abdomen, and breasts. These are normal changes in the body during pregnancy. There are no exercises or medications to take which prevent this change.  · Constipation may be treated with a stool softener or adding bulk to your diet. Drinking lots of fluids, fiber in vegetables, fruits, and whole grains are helpful.  · Exercising is also helpful. If you have been very active up until your pregnancy, most of these activities can be continued during your pregnancy. If you have been less active, it is helpful to start an exercise  program such as walking. Consult your caregiver before starting exercise programs.  · Avoid all smoking, alcohol, un-prescribed drugs, herbs and "street drugs" during your pregnancy. These chemicals affect the formation and growth of the baby. Avoid chemicals throughout the pregnancy to ensure the delivery of a healthy infant.  · Backache, varicose veins and hemorrhoids may develop or get worse.  · You will tire more easily in the third trimester, which is normal.  · The baby's movements may be stronger and more often.  · You may become short of breath easily.  · Your belly button may stick out.  · A yellow discharge may leak from your breasts called colostrum.  · You may have a bloody mucus discharge. This usually occurs a few days to a week before labor begins.  HOME CARE INSTRUCTIONS   · Keep your caregiver's appointments. Follow your caregiver's instructions regarding medication use, exercise, and diet.  · During pregnancy, you are providing food for you and your baby. Continue to eat regular, well-balanced meals. Choose foods such as meat, fish, milk and other low fat dairy products, vegetables, fruits, and whole-grain breads and cereals. Your caregiver will tell you of the ideal weight gain.  · A physical sexual relationship may be continued throughout pregnancy if there are no other problems such as early (premature) leaking of amniotic fluid from the membranes, vaginal bleeding, or belly (abdominal) pain.  · Exercise regularly if there are no restrictions. Check with your caregiver if you are unsure of the safety of your exercises. Greater weight gain will occur in the last 2 trimesters of pregnancy. Exercising helps:  · Control your weight.  · Get you in shape for labor and delivery.  · You lose weight after you deliver.  · Rest a lot with legs elevated, or as needed for leg cramps or low back pain.  · Wear a good support or jogging bra for breast tenderness during pregnancy. This may help if worn during  sleep. Pads or tissues may be used in the bra if you are leaking colostrum.  · Do not use hot tubs, steam rooms, or saunas.  · Wear your seat belt when driving. This protects you and your baby if you are in an accident.  · Avoid raw meat, cat litter boxes and soil used by cats. These carry germs that can cause birth defects in the baby.  · It is easier to loose urine during pregnancy. Tightening up and strengthening the pelvic muscles will help with this problem. You can practice stopping your urination while you are going to the bathroom. These are the same muscles you need to strengthen. It is also the muscles you would use if you were trying to stop from passing gas. You can practice tightening these muscles up 10 times a set and repeating this about 3 times per day. Once you know what muscles to tighten up, do not perform these   exercises during urination. It is more likely to cause an infection by backing up the urine.  · Ask for help if you have financial, counseling or nutritional needs during pregnancy. Your caregiver will be able to offer counseling for these needs as well as refer you for other special needs.  · Make a list of emergency phone numbers and have them available.  · Plan on getting help from family or friends when you go home from the hospital.  · Make a trial run to the hospital.  · Take prenatal classes with the father to understand, practice and ask questions about the labor and delivery.  · Prepare the baby's room/nursery.  · Do not travel out of the city unless it is absolutely necessary and with the advice of your caregiver.  · Wear only low or no heal shoes to have better balance and prevent falling.  MEDICATIONS AND DRUG USE IN PREGNANCY  · Take prenatal vitamins as directed. The vitamin should contain 1 milligram of folic acid. Keep all vitamins out of reach of children. Only a couple vitamins or tablets containing iron may be fatal to a baby or young child when ingested.  · Avoid use  of all medications, including herbs, over-the-counter medications, not prescribed or suggested by your caregiver. Only take over-the-counter or prescription medicines for pain, discomfort, or fever as directed by your caregiver. Do not use aspirin, ibuprofen (Motrin®, Advil®, Nuprin®) or naproxen (Aleve®) unless OK'd by your caregiver.  · Let your caregiver also know about herbs you may be using.  · Alcohol is related to a number of birth defects. This includes fetal alcohol syndrome. All alcohol, in any form, should be avoided completely. Smoking will cause low birth rate and premature babies.  · Street/illegal drugs are very harmful to the baby. They are absolutely forbidden. A baby born to an addicted mother will be addicted at birth. The baby will go through the same withdrawal an adult does.  SEEK MEDICAL CARE IF:  You have any concerns or worries during your pregnancy. It is better to call with your questions if you feel they cannot wait, rather than worry about them.  DECISIONS ABOUT CIRCUMCISION  You may or may not know the sex of your baby. If you know your baby is a boy, it may be time to think about circumcision. Circumcision is the removal of the foreskin of the penis. This is the skin that covers the sensitive end of the penis. There is no proven medical need for this. Often this decision is made on what is popular at the time or based upon religious beliefs and social issues. You can discuss these issues with your caregiver or pediatrician.  SEEK IMMEDIATE MEDICAL CARE IF:   · An unexplained oral temperature above 102° F (38.9° C) develops, or as your caregiver suggests.  · You have leaking of fluid from the vagina (birth canal). If leaking membranes are suspected, take your temperature and tell your caregiver of this when you call.  · There is vaginal spotting, bleeding or passing clots. Tell your caregiver of the amount and how many pads are used.  · You develop a bad smelling vaginal discharge with  a change in the color from clear to white.  · You develop vomiting that lasts more than 24 hours.  · You develop chills or fever.  · You develop shortness of breath.  · You develop burning on urination.  · You loose more than 2 pounds of weight   or gain more than 2 pounds of weight or as suggested by your caregiver.  · You notice sudden swelling of your face, hands, and feet or legs.  · You develop belly (abdominal) pain. Round ligament discomfort is a common non-cancerous (benign) cause of abdominal pain in pregnancy. Your caregiver still must evaluate you.  · You develop a severe headache that does not go away.  · You develop visual problems, blurred or double vision.  · If you have not felt your baby move for more than 1 hour. If you think the baby is not moving as much as usual, eat something with sugar in it and lie down on your left side for an hour. The baby should move at least 4 to 5 times per hour. Call right away if your baby moves less than that.  · You fall, are in a car accident or any kind of trauma.  · There is mental or physical violence at home.  Document Released: 11/07/2001 Document Revised: 02/05/2012 Document Reviewed: 05/12/2009  [employer]® Patient Information ©2013 [employer].

## 2013-02-25 NOTE — Progress Notes (Signed)
Patient reports good fetal movement, denies any bleeding and no rupture of membranes symptoms or contraction No complaints, all questions answered. 

## 2013-02-25 NOTE — Progress Notes (Signed)
Pt here for routine visit and PN2. C/O pressure in the sides of stomach when baby moves x 1.5 wks.

## 2013-02-26 LAB — GLUCOSE TOLERANCE, 2 HOURS W/ 1HR: Glucose, 2 hour: 125 mg/dL (ref 70–139)

## 2013-02-26 LAB — HIV ANTIBODY (ROUTINE TESTING W REFLEX): HIV: NONREACTIVE

## 2013-02-26 LAB — RPR

## 2013-02-28 ENCOUNTER — Telehealth: Payer: Self-pay | Admitting: Obstetrics and Gynecology

## 2013-02-28 NOTE — Telephone Encounter (Signed)
Spoke with pt. HIV nonreactive, RPR nonreactive, HSV neg, sugar test normal.

## 2013-03-04 ENCOUNTER — Encounter: Payer: Self-pay | Admitting: Obstetrics & Gynecology

## 2013-03-04 ENCOUNTER — Ambulatory Visit (INDEPENDENT_AMBULATORY_CARE_PROVIDER_SITE_OTHER): Payer: Medicaid Other | Admitting: Obstetrics & Gynecology

## 2013-03-04 VITALS — BP 120/70 | Wt 293.0 lb

## 2013-03-04 DIAGNOSIS — Z1389 Encounter for screening for other disorder: Secondary | ICD-10-CM

## 2013-03-04 DIAGNOSIS — O360990 Maternal care for other rhesus isoimmunization, unspecified trimester, not applicable or unspecified: Secondary | ICD-10-CM

## 2013-03-04 DIAGNOSIS — Z2913 Encounter for prophylactic Rho(D) immune globulin: Secondary | ICD-10-CM

## 2013-03-04 DIAGNOSIS — Z3482 Encounter for supervision of other normal pregnancy, second trimester: Secondary | ICD-10-CM

## 2013-03-04 DIAGNOSIS — Z331 Pregnant state, incidental: Secondary | ICD-10-CM

## 2013-03-04 LAB — POCT URINALYSIS DIPSTICK
Glucose, UA: NEGATIVE
Ketones, UA: NEGATIVE

## 2013-03-04 MED ORDER — RHO D IMMUNE GLOBULIN 1500 UNIT/2ML IJ SOLN
300.0000 ug | Freq: Once | INTRAMUSCULAR | Status: AC
  Administered 2013-03-04: 300 ug via INTRAMUSCULAR

## 2013-03-04 NOTE — Progress Notes (Signed)
In today for Rhogam injection.

## 2013-03-05 ENCOUNTER — Telehealth: Payer: Self-pay | Admitting: Obstetrics and Gynecology

## 2013-03-05 NOTE — Telephone Encounter (Signed)
Spoke with pt. [redacted] weeks pregnant. Started complaining of an ear ache this am; no fever; no other symptoms. Advised to try Tylenol this evening. If not much improvement with discomfort, call us back tomorrow. Pt voiced understanding.

## 2013-03-18 ENCOUNTER — Ambulatory Visit (INDEPENDENT_AMBULATORY_CARE_PROVIDER_SITE_OTHER): Payer: Medicaid Other | Admitting: Obstetrics & Gynecology

## 2013-03-18 ENCOUNTER — Encounter: Payer: Self-pay | Admitting: Obstetrics & Gynecology

## 2013-03-18 VITALS — BP 100/70 | Wt 293.0 lb

## 2013-03-18 DIAGNOSIS — O360990 Maternal care for other rhesus isoimmunization, unspecified trimester, not applicable or unspecified: Secondary | ICD-10-CM

## 2013-03-18 DIAGNOSIS — Z3483 Encounter for supervision of other normal pregnancy, third trimester: Secondary | ICD-10-CM

## 2013-03-18 DIAGNOSIS — O360130 Maternal care for anti-D [Rh] antibodies, third trimester, not applicable or unspecified: Secondary | ICD-10-CM

## 2013-03-18 NOTE — Patient Instructions (Signed)
Pregnancy - Third Trimester  The third trimester of pregnancy (the last 3 months) is a period of the most rapid growth for you and your baby. The baby approaches a length of 20 inches and a weight of 6 to 10 pounds. The baby is adding on fat and getting ready for life outside your body. While inside, babies have periods of sleeping and waking, suck their thumbs, and hiccups. You can often feel small contractions of the uterus. This is false labor. It is also called Braxton-Hicks contractions. This is like a practice for labor. The usual problems in this stage of pregnancy include more difficulty breathing, swelling of the hands and feet from water retention, and having to urinate more often because of the uterus and baby pressing on your bladder.   PRENATAL EXAMS  · Blood work may continue to be done during prenatal exams. These tests are done to check on your health and the probable health of your baby. Blood work is used to follow your blood levels (hemoglobin). Anemia (low hemoglobin) is common during pregnancy. Iron and vitamins are given to help prevent this. You may also continue to be checked for diabetes. Some of the past blood tests may be done again.  · The size of the uterus is measured during each visit. This makes sure your baby is growing properly according to your pregnancy dates.  · Your blood pressure is checked every prenatal visit. This is to make sure you are not getting toxemia.  · Your urine is checked every prenatal visit for infection, diabetes and protein.  · Your weight is checked at each visit. This is done to make sure gains are happening at the suggested rate and that you and your baby are growing normally.  · Sometimes, an ultrasound is performed to confirm the position and the proper growth and development of the baby. This is a test done that bounces harmless sound waves off the baby so your caregiver can more accurately determine due dates.  · Discuss the type of pain medication and  anesthesia you will have during your labor and delivery.  · Discuss the possibility and anesthesia if a Cesarean Section might be necessary.  · Inform your caregiver if there is any mental or physical violence at home.  Sometimes, a specialized non-stress test, contraction stress test and biophysical profile are done to make sure the baby is not having a problem. Checking the amniotic fluid surrounding the baby is called an amniocentesis. The amniotic fluid is removed by sticking a needle into the belly (abdomen). This is sometimes done near the end of pregnancy if an early delivery is required. In this case, it is done to help make sure the baby's lungs are mature enough for the baby to live outside of the womb. If the lungs are not mature and it is unsafe to deliver the baby, an injection of cortisone medication is given to the mother 1 to 2 days before the delivery. This helps the baby's lungs mature and makes it safer to deliver the baby.  CHANGES OCCURING IN THE THIRD TRIMESTER OF PREGNANCY  Your body goes through many changes during pregnancy. They vary from person to person. Talk to your caregiver about changes you notice and are concerned about.  · During the last trimester, you have probably had an increase in your appetite. It is normal to have cravings for certain foods. This varies from person to person and pregnancy to pregnancy.  · You may begin to   get stretch marks on your hips, abdomen, and breasts. These are normal changes in the body during pregnancy. There are no exercises or medications to take which prevent this change.  · Constipation may be treated with a stool softener or adding bulk to your diet. Drinking lots of fluids, fiber in vegetables, fruits, and whole grains are helpful.  · Exercising is also helpful. If you have been very active up until your pregnancy, most of these activities can be continued during your pregnancy. If you have been less active, it is helpful to start an exercise  program such as walking. Consult your caregiver before starting exercise programs.  · Avoid all smoking, alcohol, un-prescribed drugs, herbs and "street drugs" during your pregnancy. These chemicals affect the formation and growth of the baby. Avoid chemicals throughout the pregnancy to ensure the delivery of a healthy infant.  · Backache, varicose veins and hemorrhoids may develop or get worse.  · You will tire more easily in the third trimester, which is normal.  · The baby's movements may be stronger and more often.  · You may become short of breath easily.  · Your belly button may stick out.  · A yellow discharge may leak from your breasts called colostrum.  · You may have a bloody mucus discharge. This usually occurs a few days to a week before labor begins.  HOME CARE INSTRUCTIONS   · Keep your caregiver's appointments. Follow your caregiver's instructions regarding medication use, exercise, and diet.  · During pregnancy, you are providing food for you and your baby. Continue to eat regular, well-balanced meals. Choose foods such as meat, fish, milk and other low fat dairy products, vegetables, fruits, and whole-grain breads and cereals. Your caregiver will tell you of the ideal weight gain.  · A physical sexual relationship may be continued throughout pregnancy if there are no other problems such as early (premature) leaking of amniotic fluid from the membranes, vaginal bleeding, or belly (abdominal) pain.  · Exercise regularly if there are no restrictions. Check with your caregiver if you are unsure of the safety of your exercises. Greater weight gain will occur in the last 2 trimesters of pregnancy. Exercising helps:  · Control your weight.  · Get you in shape for labor and delivery.  · You lose weight after you deliver.  · Rest a lot with legs elevated, or as needed for leg cramps or low back pain.  · Wear a good support or jogging bra for breast tenderness during pregnancy. This may help if worn during  sleep. Pads or tissues may be used in the bra if you are leaking colostrum.  · Do not use hot tubs, steam rooms, or saunas.  · Wear your seat belt when driving. This protects you and your baby if you are in an accident.  · Avoid raw meat, cat litter boxes and soil used by cats. These carry germs that can cause birth defects in the baby.  · It is easier to loose urine during pregnancy. Tightening up and strengthening the pelvic muscles will help with this problem. You can practice stopping your urination while you are going to the bathroom. These are the same muscles you need to strengthen. It is also the muscles you would use if you were trying to stop from passing gas. You can practice tightening these muscles up 10 times a set and repeating this about 3 times per day. Once you know what muscles to tighten up, do not perform these   exercises during urination. It is more likely to cause an infection by backing up the urine.  · Ask for help if you have financial, counseling or nutritional needs during pregnancy. Your caregiver will be able to offer counseling for these needs as well as refer you for other special needs.  · Make a list of emergency phone numbers and have them available.  · Plan on getting help from family or friends when you go home from the hospital.  · Make a trial run to the hospital.  · Take prenatal classes with the father to understand, practice and ask questions about the labor and delivery.  · Prepare the baby's room/nursery.  · Do not travel out of the city unless it is absolutely necessary and with the advice of your caregiver.  · Wear only low or no heal shoes to have better balance and prevent falling.  MEDICATIONS AND DRUG USE IN PREGNANCY  · Take prenatal vitamins as directed. The vitamin should contain 1 milligram of folic acid. Keep all vitamins out of reach of children. Only a couple vitamins or tablets containing iron may be fatal to a baby or young child when ingested.  · Avoid use  of all medications, including herbs, over-the-counter medications, not prescribed or suggested by your caregiver. Only take over-the-counter or prescription medicines for pain, discomfort, or fever as directed by your caregiver. Do not use aspirin, ibuprofen (Motrin®, Advil®, Nuprin®) or naproxen (Aleve®) unless OK'd by your caregiver.  · Let your caregiver also know about herbs you may be using.  · Alcohol is related to a number of birth defects. This includes fetal alcohol syndrome. All alcohol, in any form, should be avoided completely. Smoking will cause low birth rate and premature babies.  · Street/illegal drugs are very harmful to the baby. They are absolutely forbidden. A baby born to an addicted mother will be addicted at birth. The baby will go through the same withdrawal an adult does.  SEEK MEDICAL CARE IF:  You have any concerns or worries during your pregnancy. It is better to call with your questions if you feel they cannot wait, rather than worry about them.  DECISIONS ABOUT CIRCUMCISION  You may or may not know the sex of your baby. If you know your baby is a boy, it may be time to think about circumcision. Circumcision is the removal of the foreskin of the penis. This is the skin that covers the sensitive end of the penis. There is no proven medical need for this. Often this decision is made on what is popular at the time or based upon religious beliefs and social issues. You can discuss these issues with your caregiver or pediatrician.  SEEK IMMEDIATE MEDICAL CARE IF:   · An unexplained oral temperature above 102° F (38.9° C) develops, or as your caregiver suggests.  · You have leaking of fluid from the vagina (birth canal). If leaking membranes are suspected, take your temperature and tell your caregiver of this when you call.  · There is vaginal spotting, bleeding or passing clots. Tell your caregiver of the amount and how many pads are used.  · You develop a bad smelling vaginal discharge with  a change in the color from clear to white.  · You develop vomiting that lasts more than 24 hours.  · You develop chills or fever.  · You develop shortness of breath.  · You develop burning on urination.  · You loose more than 2 pounds of weight   or gain more than 2 pounds of weight or as suggested by your caregiver.  · You notice sudden swelling of your face, hands, and feet or legs.  · You develop belly (abdominal) pain. Round ligament discomfort is a common non-cancerous (benign) cause of abdominal pain in pregnancy. Your caregiver still must evaluate you.  · You develop a severe headache that does not go away.  · You develop visual problems, blurred or double vision.  · If you have not felt your baby move for more than 1 hour. If you think the baby is not moving as much as usual, eat something with sugar in it and lie down on your left side for an hour. The baby should move at least 4 to 5 times per hour. Call right away if your baby moves less than that.  · You fall, are in a car accident or any kind of trauma.  · There is mental or physical violence at home.  Document Released: 11/07/2001 Document Revised: 02/05/2012 Document Reviewed: 05/12/2009  [employer]® Patient Information ©2013 [employer].

## 2013-03-18 NOTE — Progress Notes (Signed)
BP weight and urine results all reviewed and noted. Patient reports good fetal movement, denies any bleeding and no rupture of membranes symptoms or regular contractions. Patient is without complaints. All questions were answered.  

## 2013-03-23 ENCOUNTER — Inpatient Hospital Stay (HOSPITAL_COMMUNITY)
Admission: AD | Admit: 2013-03-23 | Discharge: 2013-03-23 | Disposition: A | Discharge status: Home / Self Care | Payer: Medicaid Other | Source: Ambulatory Visit | Attending: Obstetrics & Gynecology | Admitting: Obstetrics & Gynecology

## 2013-03-23 ENCOUNTER — Encounter (HOSPITAL_COMMUNITY): Payer: Self-pay | Admitting: Family

## 2013-03-23 DIAGNOSIS — M545 Low back pain, unspecified: Secondary | ICD-10-CM | POA: Insufficient documentation

## 2013-03-23 DIAGNOSIS — O2390 Unspecified genitourinary tract infection in pregnancy, unspecified trimester: Secondary | ICD-10-CM | POA: Insufficient documentation

## 2013-03-23 DIAGNOSIS — N390 Urinary tract infection, site not specified: Secondary | ICD-10-CM | POA: Insufficient documentation

## 2013-03-23 DIAGNOSIS — O479 False labor, unspecified: Secondary | ICD-10-CM

## 2013-03-23 DIAGNOSIS — O2343 Unspecified infection of urinary tract in pregnancy, third trimester: Secondary | ICD-10-CM

## 2013-03-23 LAB — URINE MICROSCOPIC-ADD ON

## 2013-03-23 LAB — URINALYSIS, ROUTINE W REFLEX MICROSCOPIC
Nitrite: NEGATIVE
Specific Gravity, Urine: 1.025 (ref 1.005–1.030)
pH: 6 (ref 5.0–8.0)

## 2013-03-23 MED ORDER — CEFTRIAXONE SODIUM 250 MG IJ SOLR
250.0000 mg | Freq: Once | INTRAMUSCULAR | Status: AC
  Administered 2013-03-23: 250 mg via INTRAMUSCULAR
  Filled 2013-03-23: qty 250

## 2013-03-23 MED ORDER — DEXTROSE 5 % IV SOLN
1.0000 g | Freq: Once | INTRAVENOUS | Status: DC
  Filled 2013-03-23: qty 10

## 2013-03-23 MED ORDER — CEPHALEXIN 500 MG PO CAPS: 500.0000 mg | ORAL_CAPSULE | Freq: Four times a day (QID) | ORAL | Status: DC

## 2013-03-23 NOTE — MAU Note (Signed)
Patient presents to MAU with c/o lower back pain since yesterday and difficulty starting stream of urine today. Reports she feels unable to empty bladder fully.  Denies vaginal bleeding, LOF or cramping. Reports + fetal movement.

## 2013-03-23 NOTE — MAU Provider Note (Signed)
Chief Complaint:  Urinary Retention and Back Pain   First Provider Initiated Contact with Patient 03/23/13 1919      HPI: MARCEDES TECH is a 22 y.o. G2P1001 at [redacted]w[redacted]d who presents to maternity admissions reporting onset last night of low back pain. The mid low back pain is constant and does not radiate. Today she began having inability to empty bladder completely, voiding very small amounts, urgency and suprapubic pain with urination. Denies burning or gross hematuria.  Has not had fevers, previous similar episodes or hx of stones. Has not taken any analgesic. Drank 8 bottels of water today.  Denies contractions, leakage of fluid or vaginal bleeding. Good fetal movement.   Pregnancy Course: Essentially uncomplicated with care at [facility]. UTI x2 in pregnancy of 2012.   Past Medical History: Past Medical History  Diagnosis Date  . No pertinent past medical history   . Preterm labor without delivery in second trimester June 2012    Positive FFN in June  . UTI (lower urinary tract infection)   . Bacterial vaginosis   . Chlamydia   No PTD  Past obstetric history: OB History   Grav Para Term Preterm Abortions TAB SAB Ect Mult Living   2 1 1  0      1     # Outc Date GA Lbr Len/2nd Wgt Sex Del Anes PTL Lv   1 TRM 9/12 [redacted]w[redacted]d 21:47 / 00:46 3.63kg(8lb) M SVD EPI  Yes   2 CUR               Past Surgical History: Past Surgical History  Procedure Laterality Date  . No past surgeries      Family History: Family History  Problem Relation Age of Onset  . Diabetes Other     Social History: History  Substance Use Topics  . Smoking status: Never Smoker   . Smokeless tobacco: Never Used  . Alcohol Use: No    Allergies: No Known Allergies  Meds:  Prescriptions prior to admission  Medication Sig Dispense Refill  . Prenatal Vit-Fe Fumarate-FA (PRENATAL MULTIVITAMIN) TABS Take 1 tablet by mouth daily.        ROS: Pertinent findings in history of present illness.  Physical  Exam  Blood pressure 119/72, pulse 106, temperature 97.6 F (36.4 C), temperature source Oral, resp. rate 18, last menstrual period 08/10/2012, unknown if currently breastfeeding. GENERAL: Well-developed, well-nourished female in no acute distress.  HEENT: normocephalic HEART: normal rate RESP: normal effort ABDOMEN: Soft, non-tender, gravid appropriate for gestational age EXTREMITIES: Nontender, no edema BACK: neg CVAT NEURO: alert and oriented PELVIC: NEFG with no lesions seen, physiologic discharge, no blood, cervix clean   Dilation: Closed Exam by:: D Britanie Harshman CNM post, L/C/H   FHT:  Baseline120 , moderate variability, accelerations present, no decelerations Contractions: none   Labs: Results for orders placed during the hospital encounter of 03/23/13 (from the past 24 hour(s))  URINALYSIS, ROUTINE W REFLEX MICROSCOPIC     Status: Abnormal   Collection Time    03/23/13  6:35 PM      Result Value Range   Color, Urine YELLOW  YELLOW   APPearance HAZY (*) CLEAR   Specific Gravity, Urine 1.025  1.005 - 1.030   pH 6.0  5.0 - 8.0   Glucose, UA NEGATIVE  NEGATIVE mg/dL   Hgb urine dipstick LARGE (*) NEGATIVE   Bilirubin Urine NEGATIVE  NEGATIVE   Ketones, ur NEGATIVE  NEGATIVE mg/dL   Protein, ur 30 (*)  NEGATIVE mg/dL   Urobilinogen, UA 0.2  0.0 - 1.0 mg/dL   Nitrite NEGATIVE  NEGATIVE   Leukocytes, UA SMALL (*) NEGATIVE  URINE MICROSCOPIC-ADD ON     Status: Abnormal   Collection Time    03/23/13  6:35 PM      Result Value Range   Squamous Epithelial / LPF FEW (*) RARE   WBC, UA 21-50  <3 WBC/hpf   RBC / HPF 7-10  <3 RBC/hpf   Bacteria, UA FEW (*) RARE    Imaging:    MAU Course: Culture sent I&O cath> 50cc Bladder scan: 145cc 1955: D/W Dr. Despina Hidden treat with Rocephin IM x 1 dose, then Keflex course. 2005: Care assumed by Thressa Sheller CNM  Assessment: 1. UTI (urinary tract infection) during pregnancy, third trimester     Plan: Discharge home     Medication List     TAKE these medications       cephALEXin 500 MG capsule  Commonly known as:  KEFLEX  Take 1 capsule (500 mg total) by mouth 4 (four) times daily.     prenatal multivitamin Tabs  Take 1 tablet by mouth daily.        Danae Orleans, CNM 03/23/2013 7:20 PM

## 2013-03-25 LAB — URINE CULTURE

## 2013-04-01 ENCOUNTER — Encounter: Payer: Self-pay | Admitting: Women's Health

## 2013-04-01 ENCOUNTER — Ambulatory Visit (INDEPENDENT_AMBULATORY_CARE_PROVIDER_SITE_OTHER): Payer: Medicaid Other | Admitting: Women's Health

## 2013-04-01 VITALS — BP 100/78 | Wt 291.4 lb

## 2013-04-01 DIAGNOSIS — O360130 Maternal care for anti-D [Rh] antibodies, third trimester, not applicable or unspecified: Secondary | ICD-10-CM

## 2013-04-01 DIAGNOSIS — O99019 Anemia complicating pregnancy, unspecified trimester: Secondary | ICD-10-CM

## 2013-04-01 DIAGNOSIS — Z331 Pregnant state, incidental: Secondary | ICD-10-CM

## 2013-04-01 DIAGNOSIS — Z1389 Encounter for screening for other disorder: Secondary | ICD-10-CM

## 2013-04-01 DIAGNOSIS — E669 Obesity, unspecified: Secondary | ICD-10-CM | POA: Insufficient documentation

## 2013-04-01 DIAGNOSIS — O360990 Maternal care for other rhesus isoimmunization, unspecified trimester, not applicable or unspecified: Secondary | ICD-10-CM

## 2013-04-01 DIAGNOSIS — Z3483 Encounter for supervision of other normal pregnancy, third trimester: Secondary | ICD-10-CM

## 2013-04-01 DIAGNOSIS — Z6841 Body Mass Index (BMI) 40.0 and over, adult: Secondary | ICD-10-CM

## 2013-04-01 LAB — POCT URINALYSIS DIPSTICK
Glucose, UA: NEGATIVE
Nitrite, UA: NEGATIVE
Protein, UA: NEGATIVE

## 2013-04-01 NOTE — Progress Notes (Signed)
Reports good fm. Denies uc's, lof, vb, urinary frequency, urgency, hesitancy, or dysuria.  No complaints.  Reviewed PN2 results. All questions answered. F/U 2 wks for visit

## 2013-04-05 ENCOUNTER — Inpatient Hospital Stay (HOSPITAL_COMMUNITY)
Admission: AD | Admit: 2013-04-05 | Discharge: 2013-04-05 | Disposition: A | Discharge status: Home / Self Care | Payer: Medicaid Other | Source: Ambulatory Visit | Attending: Obstetrics & Gynecology | Admitting: Obstetrics & Gynecology

## 2013-04-05 ENCOUNTER — Encounter (HOSPITAL_COMMUNITY): Payer: Self-pay | Admitting: *Deleted

## 2013-04-05 DIAGNOSIS — N930 Postcoital and contact bleeding: Secondary | ICD-10-CM

## 2013-04-05 DIAGNOSIS — O4690 Antepartum hemorrhage, unspecified, unspecified trimester: Secondary | ICD-10-CM | POA: Insufficient documentation

## 2013-04-05 DIAGNOSIS — O360990 Maternal care for other rhesus isoimmunization, unspecified trimester, not applicable or unspecified: Secondary | ICD-10-CM

## 2013-04-05 DIAGNOSIS — Z3483 Encounter for supervision of other normal pregnancy, third trimester: Secondary | ICD-10-CM

## 2013-04-05 DIAGNOSIS — N949 Unspecified condition associated with female genital organs and menstrual cycle: Secondary | ICD-10-CM | POA: Insufficient documentation

## 2013-04-05 DIAGNOSIS — O360130 Maternal care for anti-D [Rh] antibodies, third trimester, not applicable or unspecified: Secondary | ICD-10-CM

## 2013-04-05 LAB — URINE MICROSCOPIC-ADD ON

## 2013-04-05 LAB — URINALYSIS, ROUTINE W REFLEX MICROSCOPIC
Bilirubin Urine: NEGATIVE
Ketones, ur: NEGATIVE mg/dL
Protein, ur: NEGATIVE mg/dL
Urobilinogen, UA: 0.2 mg/dL (ref 0.0–1.0)

## 2013-04-05 NOTE — MAU Provider Note (Signed)
  History     CSN: [id number]  Arrival date and time: 04/05/13 0240   First Provider Initiated Contact with Patient 04/05/13 0345      Chief Complaint  Patient presents with  . Pelvic Pain  . Vaginal Bleeding   HPI Patricia [name] is a 21yo G2P1001 at 33.2wks who presents for eval due to spotting after intercourse. Also having pelvic discomfort. Denies leaking or ctx. Her preg has been followed by the [facility] service.  Bld type A- with Rhophylac given on 03/04/13.  OB History   Grav Para Term Preterm Abortions TAB SAB Ect Mult Living   2 1 1  0      1      Past Medical History  Diagnosis Date  . No pertinent past medical history   . Preterm labor without delivery in second trimester June 2012    Positive FFN in June  . UTI (lower urinary tract infection)   . Bacterial vaginosis   . Chlamydia     Past Surgical History  Procedure Laterality Date  . No past surgeries      Family History  Problem Relation Age of Onset  . Diabetes Other     History  Substance Use Topics  . Smoking status: Never Smoker   . Smokeless tobacco: Never Used  . Alcohol Use: No    Allergies: No Known Allergies  Prescriptions prior to admission  Medication Sig Dispense Refill  . Prenatal Vit-Fe Fumarate-FA (PRENATAL MULTIVITAMIN) TABS Take 1 tablet by mouth daily.      . [DISCONTINUED] cephALEXin (KEFLEX) 500 MG capsule Take 1 capsule (500 mg total) by mouth 4 (four) times daily.  28 capsule  0    ROS Physical Exam   Blood pressure 118/58, pulse 90, temperature 98.2 F (36.8 C), temperature source Oral, resp. rate 18, height 5' 7.5" (1.715 m), weight 289 lb (131.09 kg), last menstrual period 08/10/2012.  Physical Exam  Constitutional: She is oriented to person, place, and time. She appears well-developed.  obese  HENT:  Head: Normocephalic.  Cardiovascular: Normal rate.   Respiratory: Effort normal.  GI: Soft.  FHR 120s +accels, no decels Occ uterine irritability on toco   Genitourinary: Vagina normal.  Cx FT/50/high; sm light brown/clear vag d/c on glove; no bright red bleeding  Musculoskeletal: Normal range of motion.  Neurological: She is alert and oriented to person, place, and time.  Skin: Skin is warm and dry.  Psychiatric: She has a normal mood and affect. Her behavior is normal. Thought content normal.   Urinalysis    Component Value Date/Time   COLORURINE YELLOW 04/05/2013 0250   APPEARANCEUR CLEAR 04/05/2013 0250   LABSPEC 1.025 04/05/2013 0250   PHURINE 6.0 04/05/2013 0250   GLUCOSEU NEGATIVE 04/05/2013 0250   HGBUR MODERATE* 04/05/2013 0250   BILIRUBINUR NEGATIVE 04/05/2013 0250   KETONESUR NEGATIVE 04/05/2013 0250   PROTEINUR NEGATIVE 04/05/2013 0250   UROBILINOGEN 0.2 04/05/2013 0250   NITRITE NEGATIVE 04/05/2013 0250   NITRITE neg 04/01/2013 0952   LEUKOCYTESUR NEGATIVE 04/05/2013 0250      MAU Course  Procedures    Assessment and Plan  IUP at 33.2wks Postcoital bleeding in 3rd trimester  D/C home with preterm labor precautions F/U with [facility] on 5/20 as scheduled or sooner prn  Patricia [name] 04/05/2013, 3:52 AM

## 2013-04-05 NOTE — MAU Note (Signed)
Pt reports vaginal bleeding after intercourse at about 0145.

## 2013-04-08 ENCOUNTER — Inpatient Hospital Stay (HOSPITAL_COMMUNITY)
Admission: AD | Admit: 2013-04-08 | Discharge: 2013-04-09 | DRG: 778 | Disposition: A | Discharge status: Home / Self Care | Payer: Medicaid Other | Source: Ambulatory Visit | Attending: Obstetrics & Gynecology | Admitting: Obstetrics & Gynecology

## 2013-04-08 ENCOUNTER — Encounter (HOSPITAL_COMMUNITY): Payer: Self-pay | Admitting: *Deleted

## 2013-04-08 DIAGNOSIS — O99210 Obesity complicating pregnancy, unspecified trimester: Secondary | ICD-10-CM | POA: Diagnosis present

## 2013-04-08 DIAGNOSIS — E669 Obesity, unspecified: Secondary | ICD-10-CM | POA: Diagnosis present

## 2013-04-08 DIAGNOSIS — O4700 False labor before 37 completed weeks of gestation, unspecified trimester: Secondary | ICD-10-CM

## 2013-04-08 DIAGNOSIS — O4290 Premature rupture of membranes, unspecified as to length of time between rupture and onset of labor, unspecified weeks of gestation: Secondary | ICD-10-CM

## 2013-04-08 DIAGNOSIS — N949 Unspecified condition associated with female genital organs and menstrual cycle: Secondary | ICD-10-CM | POA: Diagnosis present

## 2013-04-08 DIAGNOSIS — R109 Unspecified abdominal pain: Secondary | ICD-10-CM | POA: Diagnosis present

## 2013-04-08 LAB — GROUP B STREP BY PCR: Group B strep by PCR: INVALID — AB

## 2013-04-08 LAB — COMPREHENSIVE METABOLIC PANEL
BUN: 4 mg/dL — ABNORMAL LOW (ref 6–23)
CO2: 22 mEq/L (ref 19–32)
Calcium: 9.4 mg/dL (ref 8.4–10.5)
Chloride: 104 mEq/L (ref 96–112)
Creatinine, Ser: 0.72 mg/dL (ref 0.50–1.10)
GFR calc Af Amer: >90 mL/min (ref >90)
GFR calc non Af Amer: >90 mL/min (ref >90)
Glucose, Bld: 102 mg/dL — ABNORMAL HIGH (ref 70–99)
Total Bilirubin: 0.3 mg/dL (ref 0.3–1.2)

## 2013-04-08 LAB — CBC
HCT: 30.8 % — ABNORMAL LOW (ref 36.0–46.0)
Hemoglobin: 10.6 g/dL — ABNORMAL LOW (ref 12.0–15.0)
MCH: 28 pg (ref 26.0–34.0)
MCV: 81.5 fL (ref 78.0–100.0)
RBC: 3.78 MIL/uL — ABNORMAL LOW (ref 3.87–5.11)
WBC: 8.2 10*3/uL (ref 4.0–10.5)

## 2013-04-08 LAB — WET PREP, GENITAL
Trich, Wet Prep: NONE SEEN
Yeast Wet Prep HPF POC: NONE SEEN

## 2013-04-08 MED ORDER — ZOLPIDEM TARTRATE 5 MG PO TABS: 5.0000 mg | ORAL_TABLET | Freq: Every evening | ORAL | Status: DC | PRN

## 2013-04-08 MED ORDER — DOCUSATE SODIUM 100 MG PO CAPS: 100.0000 mg | ORAL_CAPSULE | Freq: Every day | ORAL | Status: DC

## 2013-04-08 MED ORDER — LACTATED RINGERS IV SOLN
INTRAVENOUS | Status: DC
  Administered 2013-04-08 – 2013-04-09 (×3): via INTRAVENOUS

## 2013-04-08 MED ORDER — MAGNESIUM SULFATE BOLUS VIA INFUSION
4.0000 g | Freq: Once | INTRAVENOUS | Status: AC
  Administered 2013-04-08: 4 g via INTRAVENOUS
  Filled 2013-04-08: qty 500

## 2013-04-08 MED ORDER — PRENATAL MULTIVITAMIN CH
1.0000 | ORAL_TABLET | Freq: Every day | ORAL | Status: DC
  Administered 2013-04-08 – 2013-04-09 (×2): 1 via ORAL
  Filled 2013-04-08 (×2): qty 1

## 2013-04-08 MED ORDER — LACTATED RINGERS IV BOLUS (SEPSIS)
1000.0000 mL | Freq: Once | INTRAVENOUS | Status: AC
  Administered 2013-04-08: 1000 mL via INTRAVENOUS

## 2013-04-08 MED ORDER — ACETAMINOPHEN 325 MG PO TABS: 650.0000 mg | ORAL_TABLET | ORAL | Status: DC | PRN

## 2013-04-08 MED ORDER — AMPICILLIN SODIUM 1 G IJ SOLR
1.0000 g | Freq: Four times a day (QID) | INTRAMUSCULAR | Status: DC
  Administered 2013-04-08 – 2013-04-09 (×6): 1 g via INTRAVENOUS
  Filled 2013-04-08 (×8): qty 1000

## 2013-04-08 MED ORDER — NIFEDIPINE 10 MG PO CAPS: 10.0000 mg | ORAL_CAPSULE | ORAL | Status: DC

## 2013-04-08 MED ORDER — BETAMETHASONE SOD PHOS & ACET 6 (3-3) MG/ML IJ SUSP
12.0000 mg | INTRAMUSCULAR | Status: AC
  Administered 2013-04-08 – 2013-04-09 (×2): 12 mg via INTRAMUSCULAR
  Filled 2013-04-08 (×2): qty 2

## 2013-04-08 MED ORDER — PENICILLIN G POTASSIUM 5000000 UNITS IJ SOLR
5.0000 10*6.[IU] | Freq: Once | INTRAVENOUS | Status: AC
  Administered 2013-04-08: 5 10*6.[IU] via INTRAVENOUS
  Filled 2013-04-08: qty 5

## 2013-04-08 MED ORDER — DEXTROSE 5 % IV SOLN
2.5000 10*6.[IU] | INTRAVENOUS | Status: DC
  Filled 2013-04-08 (×3): qty 2.5

## 2013-04-08 MED ORDER — CALCIUM CARBONATE ANTACID 500 MG PO CHEW: 2.0000 | CHEWABLE_TABLET | ORAL | Status: DC | PRN

## 2013-04-08 MED ORDER — SODIUM CHLORIDE 0.9 % IV SOLN
500.0000 mg | Freq: Four times a day (QID) | INTRAVENOUS | Status: DC
  Administered 2013-04-08 – 2013-04-09 (×6): 500 mg via INTRAVENOUS
  Filled 2013-04-08 (×8): qty 500

## 2013-04-08 MED ORDER — MAGNESIUM SULFATE 40 G IN LACTATED RINGERS - SIMPLE
2.0000 g/h | INTRAVENOUS | Status: DC
  Administered 2013-04-09: 2 g/h via INTRAVENOUS
  Filled 2013-04-08: qty 500

## 2013-04-08 NOTE — Plan of Care (Signed)
Problem: Consults Goal: Birthing Suites Patient Information Press F2 to bring up selections list   Pt < [redacted] weeks EGA     

## 2013-04-08 NOTE — Consult Note (Signed)
Neonatology Consult to Antenatal Patient:  I was asked by Dr. Penne Lash to see this patient in order to provide antenatal counseling due to premature ROM and threatened preterm labor. She has received good PNC at [facility] in [place].  [name] is admitted today at 39 5/[redacted] weeks GA due to SROM. She is currently not having active labor. She is getting BMZ, magnesium sulfate, and IV Pen G. She is being managed expectantly.  I spoke with the patient alone. We discussed the worst case of delivery in the next 1-2 days, including usual DR management, possible respiratory complications and need for support, IV access, feedings (mother anticipates doing both bottle and breast feeding), LOS, Mortality and Morbidity, and long term outcomes. She had several questions, which I answered. I offered a NICU tour to any interested family members and would be glad to come back if she has more questions later.  Thank you for asking me to see this patient.  Doretha Sou, MD Neonatologist  The total length of face-to-face or floor/unit time for this encounter was 25 minutes. Counseling and/or coordination of care was 20 minutes of the above.

## 2013-04-08 NOTE — H&P (Signed)
Patricia [name] is a 22 y.o. G2P1001 at [redacted]w[redacted]d who presents to the MAU complaining of pelvic and abdominal pain. The pain has occurred since midnight tonight (for about 2.5 hours). She reports good fetal movement. No vaginal bleeding. No fluid leaking but has noted some white thick discharge.  Denies having any problems thus far during her pregnancy.  History OB History   Grav Para Term Preterm Abortions TAB SAB Ect Mult Living   2 1 1  0      1    preterm labor May 09, 2011 1cm dilated - maybe from exertion?  Vaginal delivery  A1GDM during that pregnancy    Past Medical History  Diagnosis Date  . No pertinent past medical history   . Preterm labor without delivery in second trimester June 2012    Positive FFN in June  . UTI (lower urinary tract infection)   . Bacterial vaginosis   . Chlamydia    Past Surgical History  Procedure Laterality Date  . No past surgeries     Family History: family history includes Diabetes in her other. Social History:  reports that she has never smoked. She has never used smokeless tobacco. She reports that she does not drink alcohol or use illicit drugs.   Prenatal Transfer Tool  Maternal Diabetes: No Genetic Screening: Normal Maternal Ultrasounds/Referrals: Normal Fetal Ultrasounds or other Referrals:  None Maternal Substance Abuse:  No Significant Maternal Medications:  None Significant Maternal Lab Results:  Lab values include: Rh negative Other Comments:  None  ROS + ctx, + fetal movement, no bleeding, + discharge  Blood pressure 111/66, pulse 96, temperature 98.2 F (36.8 C), temperature source Oral, resp. rate 16, height 5\' 7"  (1.702 m), weight 292 lb (132.45 kg), last menstrual period 08/10/2012. Exam Physical Exam  Gen: NAD Lungs: NWOB Abd: gravid but otherwise soft, nontender to palpation Ext: calves obese but no appreciable pitting edema bilaterally Neuro: grossly nonfocal, speech intact GU: normal appearing external  genitalia. SSE shows initially small pooling of thin fluid, later just thick mucous-type discharge. Dilation: 3 Cervical Position: Posterior Station: -3 Presentation: Vertex Exam by:: Elie Confer RN  Prenatal labs: ABO, Rh:   Antibody: NEG (04/01 1135) Rubella:   RPR: NON REAC (04/01 1135)  HBsAg:    HIV: NON REACTIVE (04/01 1135)  GBS:     Fern positive  FHR: baseline 120s, moderate variability, + accels, no decels Toco: initially several regular ctx, since spaced out, just irritability   Assessment/Plan: Patricia [name] is a 22 y.o. G2P1001 at [redacted]w[redacted]d who presents complaining of contractions. Cervix is dilated on exam. Fern positive, suggesting likely PPROM. Unable to do amnisure secondary to spotting with prior swabs (could cause false positive).  Plan: -Admit to antenatal unit -check gc/chlamydia, wet prep -BMZ x2 -Mag for CP prophylaxis -PCN for PPROM -NICU consult -continuous monitoring    Levert Feinstein 04/08/2013, 3:24 AM  .I have seen the patient with the resident/student and agree with the above.  Tawnya Crook

## 2013-04-08 NOTE — Progress Notes (Signed)
UR completed 

## 2013-04-09 ENCOUNTER — Inpatient Hospital Stay (HOSPITAL_COMMUNITY): Payer: Medicaid Other

## 2013-04-09 ENCOUNTER — Telehealth: Payer: Self-pay | Admitting: Obstetrics & Gynecology

## 2013-04-09 ENCOUNTER — Encounter (HOSPITAL_COMMUNITY): Payer: Self-pay | Admitting: *Deleted

## 2013-04-09 DIAGNOSIS — E669 Obesity, unspecified: Secondary | ICD-10-CM

## 2013-04-09 DIAGNOSIS — O4700 False labor before 37 completed weeks of gestation, unspecified trimester: Principal | ICD-10-CM

## 2013-04-09 DIAGNOSIS — O4290 Premature rupture of membranes, unspecified as to length of time between rupture and onset of labor, unspecified weeks of gestation: Secondary | ICD-10-CM

## 2013-04-09 LAB — TYPE AND SCREEN
Antibody Screen: POSITIVE
DAT, IgG: NEGATIVE
Unit division: 0

## 2013-04-09 LAB — GC/CHLAMYDIA PROBE AMP: CT Probe RNA: NEGATIVE

## 2013-04-09 MED ORDER — NIFEDIPINE ER OSMOTIC RELEASE 30 MG PO TB24: 30.0000 mg | ORAL_TABLET | Freq: Two times a day (BID) | ORAL | Status: DC | PRN

## 2013-04-09 NOTE — Progress Notes (Signed)
Dr. Thad Ranger at bedside and explained to pt the results of U/S, negative fern and negative aminosure.  Dr. Thad Ranger explained to pt and family that pt may be D/C home with instructions.  Pt and family verbalized understandings.

## 2013-04-09 NOTE — Telephone Encounter (Signed)
Left message. JSY 

## 2013-04-09 NOTE — Progress Notes (Signed)
Patient ID: [name], female   DOB: Feb 27, 1991, 22 y.o.   MRN: [id number]  FACULTY PRACTICE ANTEPARTUM COMPREHENSIVE PROGRESS NOTE  Patricia [name] is a 22 y.o. G2P1001 at [redacted]w[redacted]d  who is admitted for Preterm labor, PROM.  Estimated Date of Delivery: 05/22/13 Fetal presentation is cephalic.  Length of Stay:  1 Days. 04/08/2013  Subjective: Patient reports good fetal movement.  She reports occasional uterine contractions, especially after getting up to bathroom. No bleeding. No further leaking of fluid, just mucous discharge. She denies abdominal tenderness/pain or fever/chills.  Vitals:  Blood pressure 106/48, pulse 85, temperature 97.9 F (36.6 C), temperature source Oral, resp. rate 20, height 5\' 7"  (1.702 m), weight 131.543 kg (290 lb), last menstrual period 08/10/2012, SpO2 98.00%. Physical Examination: GEN:  Alert, no distress CV:  RRR, no murmur RESP:  CTAB ABD:  Gravid, normal BS, non-tender Cervical Exam: Not evaluated.  Extremities: extremities normal, atraumatic, no cyanosis or edema   Fetal Monitoring:  Baseline: 100-120 bpm, Variability: Good {> 6 bpm), Accelerations: Reactive and Decelerations: Absent TOCO:  Occasional contraction but mostly irritability  Labs:  No results found for this or any previous visit (from the past 24 hour(s)).  Imaging Studies:    none  Medications:  Scheduled . ampicillin (OMNIPEN) IV  1 g Intravenous Q6H  . docusate sodium  100 mg Oral Daily  . erythromycin  500 mg Intravenous Q6H  . prenatal multivitamin  1 tablet Oral Q1200   MagSO4 at 2 g/hour  I have reviewed the patient's current medications.  ASSESSMENT: IUP at [redacted]w[redacted]d  PPROM, threatened PTL  Patient Active Problem List   Diagnosis Date Noted  . Morbid obesity with BMI of 45.0-49.9, adult 04/01/2013  . Rh negative state in antepartum period 02/25/2013  . Supervision of other normal pregnancy 02/25/2013    PLAN: Continue routine antenatal care. No signs or symptoms  of chorio Few contractions D/C mag today - now 24 hours S/p second BMZ at 4 AM Recheck sterile spec for cervical dilation, fern, amnisure If certain of PPROM, plan for delivery at 34 weeks (tomorrow)  Napoleon Form, MD

## 2013-04-09 NOTE — Discharge Summary (Signed)
Antenatal Physician Discharge Summary  Patient ID: Patricia [name] MRN: [id number] DOB/AGE: [birth year] 21 y.o.  Admit date: 04/08/2013 Discharge date: 04/10/2013  Admission Diagnoses:  Preterm labor, PPROM, IUP at [redacted]w[redacted]d   Discharge Diagnoses: Preterm labor without delivery, IUP at [redacted]w[redacted]d   Prenatal Procedures: NST and ultrasound   Significant Diagnostic Studies:  Results for orders placed during the hospital encounter of 04/08/13 (from the past 168 hour(s))  WET PREP, GENITAL   Collection Time    04/08/13  2:50 AM      Result Value Range   Yeast Wet Prep HPF POC NONE SEEN  NONE SEEN   Trich, Wet Prep NONE SEEN  NONE SEEN   Clue Cells Wet Prep HPF POC NONE SEEN  NONE SEEN   WBC, Wet Prep HPF POC FEW (*) NONE SEEN  GC/CHLAMYDIA PROBE AMP   Collection Time    04/08/13  2:50 AM      Result Value Range   CT Probe RNA NEGATIVE  NEGATIVE   GC Probe RNA NEGATIVE  NEGATIVE  FETAL FIBRONECTIN   Collection Time    04/08/13  2:50 AM      Result Value Range   Fetal Fibronectin POSITIVE (*) NEGATIVE  CBC   Collection Time    04/08/13  3:20 AM      Result Value Range   WBC 8.2  4.0 - 10.5 K/uL   RBC 3.78 (*) 3.87 - 5.11 MIL/uL   Hemoglobin 10.6 (*) 12.0 - 15.0 g/dL   HCT 40.9 (*) 81.1 - 91.4 %   MCV 81.5  78.0 - 100.0 fL   MCH 28.0  26.0 - 34.0 pg   MCHC 34.4  30.0 - 36.0 g/dL   RDW 78.2  95.6 - 21.3 %   Platelets 167  150 - 400 K/uL  COMPREHENSIVE METABOLIC PANEL   Collection Time    04/08/13  3:20 AM      Result Value Range   Sodium 136  135 - 145 mEq/L   Potassium 3.6  3.5 - 5.1 mEq/L   Chloride 104  96 - 112 mEq/L   CO2 22  19 - 32 mEq/L   Glucose, Bld 102 (*) 70 - 99 mg/dL   BUN 4 (*) 6 - 23 mg/dL   Creatinine, Ser 0.86  0.50 - 1.10 mg/dL   Calcium 9.4  8.4 - 57.8 mg/dL   Total Protein 6.1  6.0 - 8.3 g/dL   Albumin 3.0 (*) 3.5 - 5.2 g/dL   AST 24  0 - 37 U/L   ALT 17  0 - 35 U/L   Alkaline Phosphatase 79  39 - 117 U/L   Total Bilirubin 0.3  0.3 - 1.2 mg/dL   GFR calc non Af Amer >90  >90 mL/min   GFR calc Af Amer >90  >90 mL/min  RPR   Collection Time    04/08/13  3:20 AM      Result Value Range   RPR NON REACTIVE  NON REACTIVE  TYPE AND SCREEN   Collection Time    04/08/13  3:20 AM      Result Value Range   ABO/RH(D) A NEG     Antibody Screen POS     Sample Expiration 04/11/2013     DAT, IgG NEG     Antibody Identification PASSIVELY ACQUIRED ANTI-D     Unit Number [id number]     Blood Component Type RED CELLS,LR     Unit division 00  Status of Unit REL FROM [facility]     Transfusion Status OK TO TRANSFUSE     Crossmatch Result COMPATIBLE     Unit Number [id number]     Blood Component Type RBC LR PHER1     Unit division 00     Status of Unit REL FROM [facility]     Transfusion Status OK TO TRANSFUSE     Crossmatch Result COMPATIBLE    POCT FERN TEST   Collection Time    04/08/13  3:46 AM      Result Value Range   POCT Fern Test Positive = ruptured amniotic membanes    CULTURE, BETA STREP (GROUP B ONLY)   Collection Time    04/08/13  4:03 AM      Result Value Range   Specimen Description VAGINAL/RECTAL     Special Requests VAGINAL/RECTAL     Culture GROUP B STREP(S.AGALACTIAE)ISOLATED     Report Status PENDING    GROUP B STREP BY PCR   Collection Time    04/08/13  4:26 AM      Result Value Range   Group B strep by PCR INVALID RESULTS, SPECIMEN SENT FOR CULTURE (*) NEGATIVE  AMNISURE RUPTURE OF MEMBRANE (ROM)   Collection Time    04/09/13 11:03 AM      Result Value Range   Amnisure ROM NEGATIVE    Results for orders placed during the hospital encounter of 04/05/13 (from the past 168 hour(s))  URINALYSIS, ROUTINE W REFLEX MICROSCOPIC   Collection Time    04/05/13  2:50 AM      Result Value Range   Color, Urine YELLOW  YELLOW   APPearance CLEAR  CLEAR   Specific Gravity, Urine 1.025  1.005 - 1.030   pH 6.0  5.0 - 8.0   Glucose, UA NEGATIVE  NEGATIVE mg/dL   Hgb urine dipstick MODERATE (*) NEGATIVE   Bilirubin  Urine NEGATIVE  NEGATIVE   Ketones, ur NEGATIVE  NEGATIVE mg/dL   Protein, ur NEGATIVE  NEGATIVE mg/dL   Urobilinogen, UA 0.2  0.0 - 1.0 mg/dL   Nitrite NEGATIVE  NEGATIVE   Leukocytes, UA NEGATIVE  NEGATIVE  URINE MICROSCOPIC-ADD ON   Collection Time    04/05/13  2:50 AM      Result Value Range   Squamous Epithelial / LPF RARE  RARE   WBC, UA 0-2  <3 WBC/hpf   RBC / HPF 0-2  <3 RBC/hpf   FERN:  Negative.  POOLING:  Negative AMNISURE negative on 04/09/13.  Imaging Studies:    Ultrasound shows normal AFI (12.5), cervical length of 4.75, cephalic  Treatments: Betamethasone x 2, magnesium x 24 hours, latency antibiotics  Hospital Course:  Patricia [name] is a 22 y.o. G2P1001 at [redacted]w[redacted]d  who is admitted for preterm labor and PPROM. She was contracting and had cervical dilation of 2-3 cm at time of admission. She had some pooling and positive fern on admission. She was initially started on magnesium sulfate for tocolysis and neuroprotection and also received betamethasone x 2 doses. She was seen by Neonatology during her stay. After Mag was stopped, she did have some mild occasional contractions.  She was observed, fetal heart rate monitoring remained reassuring, and she had no signs/symptoms of progressing preterm labor or other maternal-fetal concerns.    Sterile speculum exam was repeated with negative pooling and negative fern. Amnisure was negative on day of discharge. Her cervical exam was unchanged from admission.  And ultrasound showed normal AFI.  She was deemed stable for discharge to home with outpatient follow up.   Discharge Exam: BP 107/58  Pulse 97  Temp(Src) 98.1 F (36.7 C) (Oral)  Resp 20  Ht 5\' 7"  (1.702 m)  Wt 135.172 kg (298 lb)  BMI 46.66 kg/m2  SpO2 98%  LMP 08/10/2012  GEN:  WNWD, no distress HEENT:  NCAT, EOMI, conjunctiva clear NECK:  Supple, non-tender, no thyromegaly, trachea midline CV: RRR, no murmur RESP:  CTAB ABD:  Soft, non-tender, no guarding  or rebound, normal bowel sounds EXTREM:  Warm, well perfused, no edema or tenderness NEURO:  Alert, oriented, no focal deficits GU:  No pooling, cervix 2/long/ballottable  FHTs:  Baseline: 110-120 bpm, Variability: Good {> 6 bpm), Accelerations: Reactive and Decelerations: Absent  Discharge Condition: stable  Disposition: 01-Home or Self Care  Discharge Orders   Future Appointments Provider Department Dept Phone   04/24/2013 4:00 PM Lazaro Arms, MD [facility] [phone]   Future Orders Complete By Expires     Discharge patient  As directed     Comments:      To home        Medication List    TAKE these medications       NIFEdipine 30 MG 24 hr tablet  Commonly known as:  PROCARDIA-XL/ADALAT-CC/NIFEDICAL-XL  Take 1 tablet (30 mg total) by mouth 2 (two) times daily as needed.     prenatal multivitamin Tabs  Take 1 tablet by mouth daily.           Follow-up Information   Follow up with [facility] On 04/11/2013. (9:00 AM for prenatal check-up)    Contact information:   [address] [place] [address] [phone]      Signed: Napoleon Form M.D. 04/10/2013, 4:52 PM

## 2013-04-10 ENCOUNTER — Ambulatory Visit (INDEPENDENT_AMBULATORY_CARE_PROVIDER_SITE_OTHER): Payer: Medicaid Other | Admitting: Obstetrics and Gynecology

## 2013-04-10 VITALS — BP 142/88 | Wt 294.4 lb

## 2013-04-10 DIAGNOSIS — O9989 Other specified diseases and conditions complicating pregnancy, childbirth and the puerperium: Secondary | ICD-10-CM

## 2013-04-10 DIAGNOSIS — O0993 Supervision of high risk pregnancy, unspecified, third trimester: Secondary | ICD-10-CM

## 2013-04-10 DIAGNOSIS — O99019 Anemia complicating pregnancy, unspecified trimester: Secondary | ICD-10-CM

## 2013-04-10 DIAGNOSIS — O360990 Maternal care for other rhesus isoimmunization, unspecified trimester, not applicable or unspecified: Secondary | ICD-10-CM

## 2013-04-10 DIAGNOSIS — O2390 Unspecified genitourinary tract infection in pregnancy, unspecified trimester: Secondary | ICD-10-CM

## 2013-04-10 NOTE — MAU Provider Note (Signed)
Attestation of Attending Supervision of Advanced Practitioner (CNM/NP): Evaluation and management procedures were performed by the Advanced Practitioner under my supervision and collaboration. I have reviewed the Advanced Practitioner's note and chart, and I agree with the management and plan.  Patricia Menzie H. 3:32 PM

## 2013-04-10 NOTE — H&P (Signed)
Agree with above note.  LEGGETT,KELLY H. 04/10/2013 3:32 PM

## 2013-04-10 NOTE — Progress Notes (Signed)
Pt states went to [facility] due to contractions and water discharge, told to follow up here. Pt still c/o contractions, back pain, and pressure. Unable to void. S/P 30 HR  hosp for r/o PPROM, initially felt to be fern POS, then after 24 hrs, Amnisure NEG. Received BMZ and Mag Sulfate x 12 hr. Today Pex: FERN NEG, normal white secretions, cervix multipl U/S :  4.0 cm cervical length, NO funneling or dilation, AFI:14.  IMP Preg 34.0 wk, No evidence of PPROM Plan: mar return to work normal duties including work.

## 2013-04-10 NOTE — Patient Instructions (Signed)
Return To Work ____________________tymesia Miller______________________________ was treated at our facility. INJURY OR ILLNESS WAS: _____ Work-related ___x__ Not work-related _____ Undetermined if work-related RETURN TO WORK  Employee may return to work on: ___05/19/2014_________________  Human resources officer may return to modified work on: ____________________ WORK ACTIVITY RESTRICTIONS Work activities not tolerated include: _____ Bending _____ Prolonged sitting _____ Lifting _____ Squatting _____ Prolonged standing _____ Otelia Limes _____ Reaching _____ Pushing and pulling _____ Walking _____ Other ____________________ Show this Return to Work statement to Proofreader at work as soon as possible. Your employer should be aware of your condition and can help with the necessary work activity restrictions. If you wish to return to work sooner than the date above, or if you have further problems which make it difficult for you to return at that time, please call us or your caregiver. _______________john v ferguson__________________________ Physician Name (Printed) _________________________________________ Physician Signature  _________________________________________ Date Document Released: 11/13/2005 Document Revised: 02/05/2012 Document Reviewed: 04/30/2007 [facility] Patient Information 2013 [place], [place].

## 2013-04-10 NOTE — Telephone Encounter (Signed)
Left message x 2. JSY 

## 2013-04-11 ENCOUNTER — Inpatient Hospital Stay (HOSPITAL_COMMUNITY)
Admission: AD | Admit: 2013-04-11 | Discharge: 2013-04-12 | Disposition: A | Discharge status: Home / Self Care | Payer: Medicaid Other | Source: Ambulatory Visit | Attending: Obstetrics & Gynecology | Admitting: Obstetrics & Gynecology

## 2013-04-11 ENCOUNTER — Encounter (HOSPITAL_COMMUNITY): Payer: Self-pay

## 2013-04-11 ENCOUNTER — Encounter: Payer: Medicaid Other | Admitting: Obstetrics & Gynecology

## 2013-04-11 DIAGNOSIS — O4700 False labor before 37 completed weeks of gestation, unspecified trimester: Secondary | ICD-10-CM | POA: Insufficient documentation

## 2013-04-11 DIAGNOSIS — O479 False labor, unspecified: Secondary | ICD-10-CM

## 2013-04-11 LAB — OB RESULTS CONSOLE GBS: GBS: POSITIVE

## 2013-04-11 NOTE — Telephone Encounter (Signed)
Left message x 3. JSY 

## 2013-04-11 NOTE — Telephone Encounter (Signed)
Attempted to call pt 3 times and left 3 messages.JSY

## 2013-04-11 NOTE — Telephone Encounter (Signed)
Spoke with pt. She saw Dr. Emelda Fear yest here at office. JSY

## 2013-04-11 NOTE — Telephone Encounter (Signed)
Patricia [name] LM..closing encounter

## 2013-04-11 NOTE — MAU Note (Signed)
Contractions every 5 to 6 minutes since about 9 p.m. Tonight.

## 2013-04-11 NOTE — Discharge Summary (Signed)
Attestation of Attending Supervision of Advanced Practitioner (CNM/NP): Evaluation and management procedures were performed by the Advanced Practitioner under my supervision and collaboration.  I have reviewed the Advanced Practitioner's note and chart, and I agree with the management and plan.  Brittain Smithey 04/11/2013 6:59 AM

## 2013-04-12 DIAGNOSIS — O479 False labor, unspecified: Secondary | ICD-10-CM

## 2013-04-12 LAB — URINALYSIS, ROUTINE W REFLEX MICROSCOPIC
Glucose, UA: 100 mg/dL — AB
Leukocytes, UA: NEGATIVE
Nitrite: NEGATIVE
pH: 6.5 (ref 5.0–8.0)

## 2013-04-12 NOTE — MAU Provider Note (Signed)
  History     CSN: [id number]  Arrival date and time: 04/11/13 2336   First Provider Initiated Contact with Patient 04/12/13 0034      Chief Complaint  Patient presents with  . Contractions   HPI Patricia [name] is a G2P1001 at 34.2wks who presents for eval of contractions this evening ranging from q 7-10 mins at home but diminishing during time in MAU. Denies leak or bldg. Reports +FM. Was admitted earlier this week and was through to have ruptured membranes but later had a neg fern, neg amnisure, and nl AFI and was d/c home with Procardia rx. She receives her prenatal care at [facility].  OB History   Grav Para Term Preterm Abortions TAB SAB Ect Mult Living   2 1 1  0      1      Past Medical History  Diagnosis Date  . No pertinent past medical history   . Preterm labor without delivery in second trimester June 2012    Positive FFN in June  . UTI (lower urinary tract infection)   . Bacterial vaginosis   . Chlamydia   . Medical history non-contributory     Past Surgical History  Procedure Laterality Date  . No past surgeries      Family History  Problem Relation Age of Onset  . Diabetes Other     History  Substance Use Topics  . Smoking status: Never Smoker   . Smokeless tobacco: Never Used  . Alcohol Use: No    Allergies: No Known Allergies  Prescriptions prior to admission  Medication Sig Dispense Refill  . NIFEdipine (PROCARDIA-XL/ADALAT-CC/NIFEDICAL-XL) 30 MG 24 hr tablet Take 1 tablet (30 mg total) by mouth 2 (two) times daily as needed.  30 tablet  0  . Prenatal Vit-Fe Fumarate-FA (PRENATAL MULTIVITAMIN) TABS Take 1 tablet by mouth daily.        ROS Physical Exam   Blood pressure 116/66, pulse 100, temperature 98.1 F (36.7 C), temperature source Oral, resp. rate 18, height 5\' 7"  (1.702 m), weight 294 lb 9.6 oz (133.63 kg), last menstrual period 08/10/2012.  Physical Exam  Constitutional: She is oriented to person, place, and time. She appears  well-developed.  HENT:  Head: Normocephalic.  Cardiovascular: Normal rate.   Respiratory: Effort normal.  GI:  FHR 125 +accels, no decels No ctx per toco- possible irritability?  Genitourinary: Vagina normal.  Cx post 2/thick/-3 (unchanged)  Musculoskeletal: Normal range of motion.  Neurological: She is alert and oriented to person, place, and time.  Skin: Skin is warm and dry.  Psychiatric: She has a normal mood and affect. Her behavior is normal. Thought content normal.   Urinalysis    Component Value Date/Time   COLORURINE YELLOW 04/11/2013 2350   APPEARANCEUR CLEAR 04/11/2013 2350   LABSPEC <1.005* 04/11/2013 2350   PHURINE 6.5 04/11/2013 2350   GLUCOSEU 100* 04/11/2013 2350   HGBUR NEGATIVE 04/11/2013 2350   BILIRUBINUR NEGATIVE 04/11/2013 2350   KETONESUR NEGATIVE 04/11/2013 2350   PROTEINUR NEGATIVE 04/11/2013 2350   UROBILINOGEN 0.2 04/11/2013 2350   NITRITE NEGATIVE 04/11/2013 2350   NITRITE neg 04/01/2013 0952   LEUKOCYTESUR NEGATIVE 04/11/2013 2350      MAU Course  Procedures    Assessment and Plan  IUP at 34.2wks Braxton Hicks ctx  D/C home with preterm labor precautions Continue with Procardia BID as directed by Dr Thad Ranger F/U at [facility] as scheduled or sooner prn  Patricia [name] 04/12/2013, 12:47 AM

## 2013-04-12 NOTE — MAU Note (Signed)
Changed toco and readjusted cardio.

## 2013-04-14 ENCOUNTER — Encounter: Payer: Self-pay | Admitting: Obstetrics & Gynecology

## 2013-04-15 ENCOUNTER — Encounter: Payer: Medicaid Other | Admitting: Obstetrics & Gynecology

## 2013-04-16 ENCOUNTER — Inpatient Hospital Stay (HOSPITAL_COMMUNITY)
Admission: AD | Admit: 2013-04-16 | Discharge: 2013-04-17 | Disposition: A | Discharge status: Home / Self Care | Payer: Medicaid Other | Source: Ambulatory Visit | Attending: Obstetrics and Gynecology | Admitting: Obstetrics and Gynecology

## 2013-04-16 DIAGNOSIS — R109 Unspecified abdominal pain: Secondary | ICD-10-CM | POA: Insufficient documentation

## 2013-04-16 DIAGNOSIS — O4700 False labor before 37 completed weeks of gestation, unspecified trimester: Secondary | ICD-10-CM | POA: Insufficient documentation

## 2013-04-16 DIAGNOSIS — Z3483 Encounter for supervision of other normal pregnancy, third trimester: Secondary | ICD-10-CM

## 2013-04-16 DIAGNOSIS — O360130 Maternal care for anti-D [Rh] antibodies, third trimester, not applicable or unspecified: Secondary | ICD-10-CM

## 2013-04-16 DIAGNOSIS — K219 Gastro-esophageal reflux disease without esophagitis: Secondary | ICD-10-CM

## 2013-04-16 DIAGNOSIS — O99891 Other specified diseases and conditions complicating pregnancy: Secondary | ICD-10-CM | POA: Insufficient documentation

## 2013-04-16 DIAGNOSIS — Z6841 Body Mass Index (BMI) 40.0 and over, adult: Secondary | ICD-10-CM

## 2013-04-16 DIAGNOSIS — R079 Chest pain, unspecified: Secondary | ICD-10-CM | POA: Insufficient documentation

## 2013-04-17 ENCOUNTER — Encounter (HOSPITAL_COMMUNITY): Payer: Self-pay | Admitting: *Deleted

## 2013-04-17 DIAGNOSIS — O360990 Maternal care for other rhesus isoimmunization, unspecified trimester, not applicable or unspecified: Secondary | ICD-10-CM

## 2013-04-17 LAB — URINALYSIS, ROUTINE W REFLEX MICROSCOPIC
Ketones, ur: NEGATIVE mg/dL
Leukocytes, UA: NEGATIVE
Nitrite: NEGATIVE
Specific Gravity, Urine: 1.01 (ref 1.005–1.030)
Urobilinogen, UA: 0.2 mg/dL (ref 0.0–1.0)
pH: 6 (ref 5.0–8.0)

## 2013-04-17 MED ORDER — GI COCKTAIL ~~LOC~~
30.0000 mL | Freq: Three times a day (TID) | ORAL | Status: DC | PRN
  Administered 2013-04-17: 30 mL via ORAL
  Filled 2013-04-17: qty 30

## 2013-04-17 MED ORDER — RANITIDINE HCL 150 MG PO TABS: 150.0000 mg | ORAL_TABLET | Freq: Two times a day (BID) | ORAL | Status: DC

## 2013-04-17 NOTE — MAU Note (Signed)
Pt states she is having chest pain and a little small part of her back. Pt states pain started about 1730 04/16/2013. Pt admits to having more pain when  she moves arm.

## 2013-04-17 NOTE — MAU Note (Signed)
Pt reports pressure in rt upper chest since 5:30 PM. States it does not feel like heartburn. Also having pain in her lower back.

## 2013-04-17 NOTE — MAU Note (Signed)
Pt sleeping during interveiw

## 2013-04-17 NOTE — MAU Provider Note (Signed)
History     CSN: [id number]  Arrival date and time: 04/16/13 2357   None     Chief Complaint  Patient presents with  . Chest Pain  . Abdominal Pain   Chest Pain  This is a new problem. The current episode started today. The onset quality is sudden. The problem occurs rarely. The problem has been gradually improving. The pain is present in the lateral region. The pain is mild. The quality of the pain is described as pressure. The pain does not radiate. Pertinent negatives include no abdominal pain, back pain, claudication, cough, hemoptysis, irregular heartbeat, leg pain, orthopnea, palpitations, PND or shortness of breath.  Abdominal Pain   22yo G2P1 with c/o pain in upper chest , radiating to legs,lower back and upper chest. States pain in chest feels like pressure mainly on left side of upper chest but has gotten better since taking GI cocktail, Denies problems with BP, or DM, Presently taking PROCARDIA for PTL prescribed last week    Past Medical History  Diagnosis Date  . No pertinent past medical history   . Preterm labor without delivery in second trimester June 2012    Positive FFN in June  . UTI (lower urinary tract infection)   . Bacterial vaginosis   . Chlamydia   . Medical history non-contributory     Past Surgical History  Procedure Laterality Date  . No past surgeries      Family History  Problem Relation Age of Onset  . Diabetes Other     History  Substance Use Topics  . Smoking status: Never Smoker   . Smokeless tobacco: Never Used  . Alcohol Use: No    Allergies: No Known Allergies  Prescriptions prior to admission  Medication Sig Dispense Refill  . Prenatal Vit-Fe Fumarate-FA (PRENATAL MULTIVITAMIN) TABS Take 1 tablet by mouth daily.      . [DISCONTINUED] NIFEdipine (PROCARDIA-XL/ADALAT-CC/NIFEDICAL-XL) 30 MG 24 hr tablet Take 1 tablet (30 mg total) by mouth 2 (two) times daily as needed.  30 tablet  0    [name]  Respiratory:  Negative for cough, hemoptysis and shortness of breath.   Cardiovascular: Positive for chest pain. Negative for palpitations, orthopnea, claudication and PND.  Gastrointestinal: Negative for abdominal pain.  Musculoskeletal: Negative for back pain.   Physical Exam Lungs  Blood pressure 120/74, pulse 100, temperature 98.2 F (36.8 C), temperature source Oral, resp. rate 20, height 5\' 7"  (1.702 m), weight 132.45 kg (292 lb), last menstrual period 08/10/2012, SpO2 100.00%.   Physical Exam  Constitutional: She is oriented to person, place, and time. She appears well-developed and well-nourished.  Neck: Normal range of motion. Neck supple.  Cardiovascular: Normal rate, regular rhythm, normal heart sounds and intact distal pulses.   No murmur heard. Respiratory: Effort normal and breath sounds normal. No respiratory distress.  GI: Soft.  gravid  Genitourinary: No vaginal discharge found.  Musculoskeletal: Normal range of motion.  Neurological: She is alert and oriented to person, place, and time.  Skin: Skin is warm and dry.  Psychiatric: She has a normal mood and affect. Her behavior is normal. Judgment and thought content normal.  FHR 140's, positive accels, no decels, Negative contractions.  MAU Course  Procedures   Assessment and Plan  IUP @ 34 weeks Hx of PTL Suspect GERD RX for Zantac 150 mg po BID. Take one 30 min before meals. Suggest pt not eat or drink at the same time  Chancy, Gwendolyn A 04/17/2013, 2:47 AM  I have seen and examined this patient and I agree with the above. Cx examined by me: 2/thick/post/-3 (unchanged). Merrit Friesen 10:06 AM 04/17/2013

## 2013-04-20 ENCOUNTER — Encounter (HOSPITAL_COMMUNITY): Payer: Self-pay | Admitting: Anesthesiology

## 2013-04-20 ENCOUNTER — Inpatient Hospital Stay (HOSPITAL_COMMUNITY)
Admission: AD | Admit: 2013-04-20 | Discharge: 2013-04-20 | Disposition: A | Discharge status: Home / Self Care | Payer: Medicaid Other | Source: Ambulatory Visit | Attending: Obstetrics & Gynecology | Admitting: Obstetrics & Gynecology

## 2013-04-20 ENCOUNTER — Encounter (HOSPITAL_COMMUNITY): Payer: Self-pay | Admitting: *Deleted

## 2013-04-20 ENCOUNTER — Inpatient Hospital Stay (HOSPITAL_COMMUNITY)
Admission: AD | Admit: 2013-04-20 | Discharge: 2013-04-22 | DRG: 775 | Disposition: A | Discharge status: Home / Self Care | Payer: Medicaid Other | Source: Ambulatory Visit | Attending: Obstetrics and Gynecology | Admitting: Obstetrics and Gynecology

## 2013-04-20 ENCOUNTER — Inpatient Hospital Stay (HOSPITAL_COMMUNITY): Payer: Medicaid Other | Admitting: Anesthesiology

## 2013-04-20 DIAGNOSIS — O4290 Premature rupture of membranes, unspecified as to length of time between rupture and onset of labor, unspecified weeks of gestation: Principal | ICD-10-CM | POA: Diagnosis present

## 2013-04-20 DIAGNOSIS — M549 Dorsalgia, unspecified: Secondary | ICD-10-CM | POA: Insufficient documentation

## 2013-04-20 DIAGNOSIS — A499 Bacterial infection, unspecified: Secondary | ICD-10-CM | POA: Insufficient documentation

## 2013-04-20 DIAGNOSIS — O4700 False labor before 37 completed weeks of gestation, unspecified trimester: Secondary | ICD-10-CM | POA: Insufficient documentation

## 2013-04-20 DIAGNOSIS — Z3483 Encounter for supervision of other normal pregnancy, third trimester: Secondary | ICD-10-CM

## 2013-04-20 DIAGNOSIS — Z22330 Carrier of Group B streptococcus: Secondary | ICD-10-CM

## 2013-04-20 DIAGNOSIS — R51 Headache: Secondary | ICD-10-CM | POA: Diagnosis not present

## 2013-04-20 DIAGNOSIS — O360130 Maternal care for anti-D [Rh] antibodies, third trimester, not applicable or unspecified: Secondary | ICD-10-CM

## 2013-04-20 DIAGNOSIS — O360990 Maternal care for other rhesus isoimmunization, unspecified trimester, not applicable or unspecified: Secondary | ICD-10-CM

## 2013-04-20 DIAGNOSIS — O99892 Other specified diseases and conditions complicating childbirth: Secondary | ICD-10-CM | POA: Diagnosis present

## 2013-04-20 DIAGNOSIS — O2390 Unspecified genitourinary tract infection in pregnancy, unspecified trimester: Secondary | ICD-10-CM | POA: Insufficient documentation

## 2013-04-20 DIAGNOSIS — N760 Acute vaginitis: Secondary | ICD-10-CM | POA: Insufficient documentation

## 2013-04-20 DIAGNOSIS — B9689 Other specified bacterial agents as the cause of diseases classified elsewhere: Secondary | ICD-10-CM

## 2013-04-20 DIAGNOSIS — R109 Unspecified abdominal pain: Secondary | ICD-10-CM | POA: Insufficient documentation

## 2013-04-20 LAB — WET PREP, GENITAL

## 2013-04-20 LAB — URINALYSIS, ROUTINE W REFLEX MICROSCOPIC
Bilirubin Urine: NEGATIVE
Hgb urine dipstick: NEGATIVE
Nitrite: NEGATIVE
Protein, ur: NEGATIVE mg/dL
Specific Gravity, Urine: 1.02 (ref 1.005–1.030)
Urobilinogen, UA: 0.2 mg/dL (ref 0.0–1.0)

## 2013-04-20 LAB — CBC
HCT: 34.2 % — ABNORMAL LOW (ref 36.0–46.0)
MCH: 27 pg (ref 26.0–34.0)
MCHC: 33 g/dL (ref 30.0–36.0)
MCV: 81.8 fL (ref 78.0–100.0)
RDW: 14 % (ref 11.5–15.5)

## 2013-04-20 LAB — POCT FERN TEST: POCT Fern Test: POSITIVE

## 2013-04-20 MED ORDER — CITRIC ACID-SODIUM CITRATE 334-500 MG/5ML PO SOLN: 30.0000 mL | ORAL | Status: DC | PRN

## 2013-04-20 MED ORDER — NALBUPHINE SYRINGE 5 MG/0.5 ML
10.0000 mg | INJECTION | INTRAMUSCULAR | Status: DC | PRN
  Administered 2013-04-20: 10 mg via INTRAVENOUS
  Filled 2013-04-20: qty 1

## 2013-04-20 MED ORDER — OXYTOCIN 40 UNITS IN LACTATED RINGERS INFUSION - SIMPLE MED
62.5000 mL/h | INTRAVENOUS | Status: DC
  Administered 2013-04-20: 62.5 mL/h via INTRAVENOUS

## 2013-04-20 MED ORDER — ACETAMINOPHEN 325 MG PO TABS: 650.0000 mg | ORAL_TABLET | ORAL | Status: DC | PRN

## 2013-04-20 MED ORDER — FENTANYL 2.5 MCG/ML BUPIVACAINE 1/10 % EPIDURAL INFUSION (WH - ANES)
INTRAMUSCULAR | Status: DC | PRN
  Administered 2013-04-20: 14 mL/h via EPIDURAL

## 2013-04-20 MED ORDER — LACTATED RINGERS IV SOLN: 500.0000 mL | Freq: Once | INTRAVENOUS | Status: DC

## 2013-04-20 MED ORDER — EPHEDRINE 5 MG/ML INJ
10.0000 mg | INTRAVENOUS | Status: DC | PRN
  Filled 2013-04-20: qty 4

## 2013-04-20 MED ORDER — OXYTOCIN BOLUS FROM INFUSION: 500.0000 mL | INTRAVENOUS | Status: DC

## 2013-04-20 MED ORDER — LACTATED RINGERS IV SOLN
INTRAVENOUS | Status: DC
  Administered 2013-04-20 (×2): via INTRAVENOUS

## 2013-04-20 MED ORDER — TERBUTALINE SULFATE 1 MG/ML IJ SOLN: 0.2500 mg | Freq: Once | INTRAMUSCULAR | Status: AC | PRN

## 2013-04-20 MED ORDER — ONDANSETRON HCL 4 MG/2ML IJ SOLN: 4.0000 mg | Freq: Four times a day (QID) | INTRAMUSCULAR | Status: DC | PRN

## 2013-04-20 MED ORDER — DIPHENHYDRAMINE HCL 50 MG/ML IJ SOLN: 12.5000 mg | INTRAMUSCULAR | Status: DC | PRN

## 2013-04-20 MED ORDER — PHENYLEPHRINE 40 MCG/ML (10ML) SYRINGE FOR IV PUSH (FOR BLOOD PRESSURE SUPPORT)
80.0000 ug | PREFILLED_SYRINGE | INTRAVENOUS | Status: DC | PRN
  Filled 2013-04-20: qty 5

## 2013-04-20 MED ORDER — EPHEDRINE 5 MG/ML INJ: 10.0000 mg | INTRAVENOUS | Status: DC | PRN

## 2013-04-20 MED ORDER — IBUPROFEN 600 MG PO TABS: 600.0000 mg | ORAL_TABLET | Freq: Four times a day (QID) | ORAL | Status: DC | PRN

## 2013-04-20 MED ORDER — LIDOCAINE HCL (PF) 1 % IJ SOLN: 30.0000 mL | INTRAMUSCULAR | Status: DC | PRN

## 2013-04-20 MED ORDER — METRONIDAZOLE 500 MG PO TABS: 500.0000 mg | ORAL_TABLET | Freq: Two times a day (BID) | ORAL | Status: DC

## 2013-04-20 MED ORDER — OXYCODONE-ACETAMINOPHEN 5-325 MG PO TABS: 1.0000 | ORAL_TABLET | ORAL | Status: DC | PRN

## 2013-04-20 MED ORDER — DEXTROSE 5 % IV SOLN
2.5000 10*6.[IU] | INTRAVENOUS | Status: DC
  Administered 2013-04-20: 2.5 10*6.[IU] via INTRAVENOUS
  Filled 2013-04-20 (×3): qty 2.5

## 2013-04-20 MED ORDER — LIDOCAINE HCL (PF) 1 % IJ SOLN
INTRAMUSCULAR | Status: DC | PRN
  Administered 2013-04-20 (×2): 3 mL

## 2013-04-20 MED ORDER — FENTANYL 2.5 MCG/ML BUPIVACAINE 1/10 % EPIDURAL INFUSION (WH - ANES)
14.0000 mL/h | INTRAMUSCULAR | Status: DC | PRN
  Filled 2013-04-20: qty 125

## 2013-04-20 MED ORDER — PHENYLEPHRINE 40 MCG/ML (10ML) SYRINGE FOR IV PUSH (FOR BLOOD PRESSURE SUPPORT): 80.0000 ug | PREFILLED_SYRINGE | INTRAVENOUS | Status: DC | PRN

## 2013-04-20 MED ORDER — LACTATED RINGERS IV SOLN
500.0000 mL | INTRAVENOUS | Status: DC | PRN
  Administered 2013-04-20: 500 mL via INTRAVENOUS

## 2013-04-20 MED ORDER — FENTANYL 2.5 MCG/ML BUPIVACAINE 1/10 % EPIDURAL INFUSION (WH - ANES): 14.0000 mL/h | INTRAMUSCULAR | Status: DC | PRN

## 2013-04-20 MED ORDER — PENICILLIN G POTASSIUM 5000000 UNITS IJ SOLR
5.0000 10*6.[IU] | Freq: Once | INTRAVENOUS | Status: AC
  Administered 2013-04-20: 5 10*6.[IU] via INTRAVENOUS
  Filled 2013-04-20: qty 5

## 2013-04-20 MED ORDER — OXYTOCIN 40 UNITS IN LACTATED RINGERS INFUSION - SIMPLE MED
1.0000 m[IU]/min | INTRAVENOUS | Status: DC
  Administered 2013-04-20: 2 m[IU]/min via INTRAVENOUS
  Filled 2013-04-20: qty 1000

## 2013-04-20 NOTE — Anesthesia Procedure Notes (Addendum)
Epidural Patient location during procedure: OB Start time: 04/20/2013 9:00 PM End time: 04/20/2013 9:15 PM  Staffing Anesthesiologist: Lewie Loron R  Preanesthetic Checklist Completed: patient identified, pre-op evaluation, timeout performed, IV checked, risks and benefits discussed and monitors and equipment checked  Epidural Patient position: sitting Prep: site prepped and draped and DuraPrep Patient monitoring: heart rate, continuous pulse ox and blood pressure Approach: midline Injection technique: LOR saline and LOR air  Needle:  Needle type: Tuohy  Needle gauge: 17 G Needle length: 9 cm Needle insertion depth: 8 cm Catheter type: closed end flexible Catheter size: 19 Gauge Catheter at skin depth: 13 cm Test dose: negative  Assessment Sensory level: T8 Events: blood not aspirated, injection not painful, no injection resistance, negative IV test and no paresthesia  Epidural Patient location during procedure: OB Start time: 04/20/2013 10:00 PM End time: 04/20/2013 10:15 PM  Staffing Anesthesiologist: Lewie Loron R  Preanesthetic Checklist Completed: patient identified, site marked, pre-op evaluation, timeout performed, risks and benefits discussed and monitors and equipment checked  Epidural Patient position: sitting Prep: site prepped and draped and DuraPrep Patient monitoring: heart rate, continuous pulse ox and blood pressure Approach: midline Injection technique: LOR air and LOR saline  Needle:  Needle type: Tuohy  Needle gauge: 17 G Needle length: 9 cm Needle insertion depth: 8 cm Catheter type: closed end flexible Catheter size: 19 Gauge Catheter at skin depth: 13 cm Test dose: negative  Assessment Sensory level: T8  Additional Notes Replaced non-working catheter. Pt with appropriate relief afterward.Reason for block:surgical anesthesia

## 2013-04-20 NOTE — MAU Note (Signed)
Pt reprots she woke up and she was wet. Around 1150. Clear fluid out . Good fetal movement reported.

## 2013-04-20 NOTE — MAU Note (Signed)
Pt reports she started having cramping this morning at 7am. Stated the pain woke her up out of her sleep. Denies SROM or bleeding at this time and reports good fetal movement.

## 2013-04-20 NOTE — MAU Provider Note (Signed)
Attestation of Attending Supervision of Advanced Practitioner (CNM/NP): Evaluation and management procedures were performed by the Advanced Practitioner under my supervision and collaboration.  I have reviewed the Advanced Practitioner's note and chart, and I agree with the management and plan.  Dalores Weger 04/20/2013 10:43 PM

## 2013-04-20 NOTE — H&P (Signed)
Attestation of Attending Supervision of Advanced Practitioner (CNM/NP): Evaluation and management procedures were performed by the Advanced Practitioner under my supervision and collaboration.  I have reviewed the Advanced Practitioner's note and chart, and I agree with the management and plan.  Leonie Amacher 04/20/2013 10:42 PM

## 2013-04-20 NOTE — Progress Notes (Signed)
Patient ID: Patricia [name], female   DOB: 1991/08/21, 22 y.o.   MRN: [id number]  S:  Pt comfortable. Not feeling any ctx.  O: Filed Vitals:   04/20/13 1310 04/20/13 1604 04/20/13 1611  BP: 122/69 123/63   Pulse: 101 96   Temp:  98.8 F (37.1 C)   TempSrc:  Oral   Resp: 18 18   Height:   5\' 7"  (1.702 m)  Weight:   131.09 kg (289 lb)    CERV:  3/50/-3, posterior  FHTs: 120, mod var, accels present, no decels TOCO:  Occasional ctx  A/P 22 y.o. G2P1001 at [redacted]w[redacted]d with PPROM - ctx have spaced out - Will start pitocin  Napoleon Form, MD

## 2013-04-20 NOTE — H&P (Signed)
Patricia [name] is a 22 y.o. female G2P1001 at [redacted]w[redacted]d presenting for rupture of membranes and ctx.  Ctx started at 7 am. Was seen in MAU at around 10 am with infrequent contractions, cervix 2-3 cm. Went home and came back with leaking fluid from 11:50 AM.  No bleeding. Ctx are still irregular and moderate strength. Baby moving well.  Gets care at [facility]. Was admitted 5/13 for threatened preterm labor, received betamethasone, magnesium and started on procardia. Procardia has not worked. Continues to have ctx off and on. No dysuria, fever/chills, headache or vision changes. No nausea or vomiting.   Maternal Medical History:  Reason for admission: Rupture of membranes and contractions.   Contractions: Onset was 3-5 hours ago.    Fetal activity: Perceived fetal activity is normal.   Last perceived fetal movement was within the past hour.    Prenatal complications: Preterm labor.   Prenatal Complications - Diabetes: none.    OB History   Grav Para Term Preterm Abortions TAB SAB Ect Mult Living   2 1 1  0      1     Past Medical History  Diagnosis Date  . No pertinent past medical history   . Preterm labor without delivery in second trimester June 2012    Positive FFN in June  . UTI (lower urinary tract infection)   . Bacterial vaginosis   . Chlamydia   . Medical history non-contributory    Past Surgical History  Procedure Laterality Date  . No past surgeries     Family History: family history includes Diabetes in her other. Social History:  reports that she has never smoked. She has never used smokeless tobacco. She reports that she does not drink alcohol or use illicit drugs.   Prenatal Transfer Tool  Maternal Diabetes: No Genetic Screening: Normal Maternal Ultrasounds/Referrals: Normal Fetal Ultrasounds or other Referrals:  None Maternal Substance Abuse:  No Significant Maternal Medications:  None Significant Maternal Lab Results:  Lab values include: Group B Strep  positive Other Comments:  None  ROS  Pertinent pos and neg listed in HPI     Blood pressure 122/69, pulse 101, resp. rate 18, last menstrual period 08/10/2012. Maternal Exam:  Uterine Assessment: Contraction strength is mild.  Contraction frequency is irregular.   Abdomen: Fetal presentation: vertex  Introitus: Normal vulva. Normal vagina.  Ferning test: positive.  Amniotic fluid character: clear. Significant pooling of clear fluid in vagina.   Pelvis: adequate for delivery.   Cervix: Cervix evaluated by digital exam.     Fetal Exam Fetal Monitor Review: Mode: ultrasound.   Baseline rate: 120-130.  Variability: moderate (6-25 bpm).   Pattern: accelerations present and no decelerations.    Fetal State Assessment: Category I - tracings are normal.     Physical Exam  Constitutional: She is oriented to person, place, and time. She appears well-developed and well-nourished. No distress.  HENT:  Head: Normocephalic and atraumatic.  Eyes: Conjunctivae and EOM are normal.  Neck: Normal range of motion. Neck supple.  Cardiovascular: Normal rate.   Respiratory: Effort normal. No respiratory distress.  GI: There is no tenderness. There is no rebound and no guarding.  Genitourinary:  Pooling clear fluid in vagina. Introitus wet. No bleeding.  Musculoskeletal: Normal range of motion. She exhibits edema. She exhibits no tenderness.  Neurological: She is alert and oriented to person, place, and time.  Skin: Skin is warm and dry.  Psychiatric: She has a normal mood and affect.  Prenatal labs: ABO, Rh: --/--/A NEG (05/13 0320) Antibody: POS (05/13 0320) Rubella:   RPR: NON REACTIVE (05/13 0320)  HBsAg:    HIV: NON REACTIVE (04/01 1135)  GBS:   Positive  Assessment/Plan: 22 y.o. G2P1001 at [redacted]w[redacted]d with Preterm ROM - Admit to L&D - GBS prophylaxis with PCN - Pitocin if needed - Anticipate SVD  Napoleon Form 04/20/2013, 1:33 PM

## 2013-04-20 NOTE — Anesthesia Preprocedure Evaluation (Signed)
Anesthesia Evaluation  Patient identified by MRN, date of birth, ID band Patient awake    Reviewed: Allergy & Precautions, H&P , NPO status , Patient's Chart, lab work & pertinent test results  Airway Mallampati: II TM Distance: >3 FB Neck ROM: full    Dental no notable dental hx.    Pulmonary neg pulmonary ROS,  breath sounds clear to auscultation  Pulmonary exam normal       Cardiovascular negative cardio ROS      Neuro/Psych negative neurological ROS  negative psych ROS   GI/Hepatic negative GI ROS, Neg liver ROS,   Endo/Other  Morbid obesity  Renal/GU negative Renal ROS     Musculoskeletal negative musculoskeletal ROS (+)   Abdominal (+) - obese,   Peds  Hematology negative hematology ROS (+)   Anesthesia Other Findings   Reproductive/Obstetrics (+) Pregnancy                           Anesthesia Physical  Anesthesia Plan  ASA: III  Anesthesia Plan: Epidural   Post-op Pain Management:    Induction:   Airway Management Planned:   Additional Equipment:   Intra-op Plan:   Post-operative Plan:   Informed Consent: I have reviewed the patients History and Physical, chart, labs and discussed the procedure including the risks, benefits and alternatives for the proposed anesthesia with the patient or authorized representative who has indicated his/her understanding and acceptance.     Plan Discussed with:   Anesthesia Plan Comments:         Anesthesia Quick Evaluation

## 2013-04-20 NOTE — MAU Provider Note (Signed)
  History     CSN: [id number]  Arrival date and time: 04/20/13 0825   None     Chief Complaint  Patient presents with  . Abdominal Pain   HPI 22 y.o. G2P1001 at [redacted]w[redacted]d with lower abdominal and back pain starting at 7 am today. Pain comes and goes, every 10 to 15 minutes. Pain in her back is severe and goes up her back sone. No loss of fluid, no bleeding. Some mucous but no other discharge. Baby moving well.  Gets care at [facility]. Was admitted 5/13 for threatened preterm labor, received betamethasone, magnesium and started on procardia. Procardia has not worked. Continues to have ctx off and on. No dysuria, fever/chills, headache or vision changes. No nausea or vomiting.  OB History   Grav Para Term Preterm Abortions TAB SAB Ect Mult Living   2 1 1  0      1      Past Medical History  Diagnosis Date  . No pertinent past medical history   . Preterm labor without delivery in second trimester June 2012    Positive FFN in June  . UTI (lower urinary tract infection)   . Bacterial vaginosis   . Chlamydia   . Medical history non-contributory     Past Surgical History  Procedure Laterality Date  . No past surgeries      Family History  Problem Relation Age of Onset  . Diabetes Other     History  Substance Use Topics  . Smoking status: Never Smoker   . Smokeless tobacco: Never Used  . Alcohol Use: No    Allergies: No Known Allergies  Prescriptions prior to admission  Medication Sig Dispense Refill  . Prenatal Vit-Fe Fumarate-FA (PRENATAL MULTIVITAMIN) TABS Take 1 tablet by mouth daily.      . ranitidine (ZANTAC) 150 MG tablet Take 1 tablet (150 mg total) by mouth 2 (two) times daily.  60 tablet  0    ROS    Pertinent positives and negatives mentioned in HPI.  Physical Exam   Blood pressure 111/57, pulse 99, temperature 97.7 F (36.5 C), temperature source Oral, resp. rate 18, last menstrual period 08/10/2012.  Physical Exam  Constitutional: She is oriented  to person, place, and time. She appears well-developed and well-nourished. No distress.  Obese  HENT:  Head: Normocephalic and atraumatic.  Eyes: Conjunctivae and EOM are normal.  Neck: Normal range of motion. Neck supple.  Cardiovascular: Normal rate.   Respiratory: Effort normal. No respiratory distress.  GI: Soft. There is no tenderness. There is no rebound and no guarding.  Genitourinary:  Normal external genitalia. White/green watery vaginal discharge. Cervix 3/40/-3 posterior, soft. No blood.  Musculoskeletal: Normal range of motion. She exhibits edema (trace). She exhibits no tenderness.  Neurological: She is alert and oriented to person, place, and time.  Skin: Skin is warm and dry.   FHTs:  Baseline: 125-130 Variability:  moderate Accelerations:  Present  Decelerations:  absent TOCO: q 10-12 min  MAU Course  Procedures   Assessment and Plan  21 y.o. G2P1001 at [redacted]w[redacted]d with abdominal/back pain - Having contractions every 10-12 min. Declines procardia or pain medication - Cervix 3 cm - BV - metronidazole - May be in early labor - Stable for discharge home. Labor precautions reviewed. F/U [facility] 5/29 as scheduled.  Napoleon Form 04/20/2013, 10:26 AM

## 2013-04-21 ENCOUNTER — Encounter (HOSPITAL_COMMUNITY): Payer: Self-pay | Admitting: *Deleted

## 2013-04-21 DIAGNOSIS — O42919 Preterm premature rupture of membranes, unspecified as to length of time between rupture and onset of labor, unspecified trimester: Secondary | ICD-10-CM

## 2013-04-21 LAB — CBC
HCT: 29.6 % — ABNORMAL LOW (ref 36.0–46.0)
MCHC: 33.1 g/dL (ref 30.0–36.0)
Platelets: 175 10*3/uL (ref 150–400)
RDW: 13.8 % (ref 11.5–15.5)
WBC: 10.9 10*3/uL — ABNORMAL HIGH (ref 4.0–10.5)

## 2013-04-21 MED ORDER — IBUPROFEN 600 MG PO TABS
600.0000 mg | ORAL_TABLET | Freq: Four times a day (QID) | ORAL | Status: DC
  Administered 2013-04-21 – 2013-04-22 (×6): 600 mg via ORAL
  Filled 2013-04-21 (×6): qty 1

## 2013-04-21 MED ORDER — LANOLIN HYDROUS EX OINT: TOPICAL_OINTMENT | CUTANEOUS | Status: DC | PRN

## 2013-04-21 MED ORDER — DIPHENHYDRAMINE HCL 25 MG PO CAPS: 25.0000 mg | ORAL_CAPSULE | Freq: Four times a day (QID) | ORAL | Status: DC | PRN

## 2013-04-21 MED ORDER — PRENATAL MULTIVITAMIN CH
1.0000 | ORAL_TABLET | Freq: Every day | ORAL | Status: DC
  Administered 2013-04-21: 1 via ORAL
  Filled 2013-04-21: qty 1

## 2013-04-21 MED ORDER — OXYCODONE-ACETAMINOPHEN 5-325 MG PO TABS
1.0000 | ORAL_TABLET | ORAL | Status: DC | PRN
  Administered 2013-04-21 (×4): 1 via ORAL
  Administered 2013-04-22: 2 via ORAL
  Administered 2013-04-22 (×3): 1 via ORAL
  Filled 2013-04-21 (×2): qty 1
  Filled 2013-04-21: qty 2
  Filled 2013-04-21 (×5): qty 1

## 2013-04-21 MED ORDER — BENZOCAINE-MENTHOL 20-0.5 % EX AERO
1.0000 "application " | INHALATION_SPRAY | CUTANEOUS | Status: DC | PRN
  Administered 2013-04-21: 1 via TOPICAL
  Filled 2013-04-21 (×2): qty 56

## 2013-04-21 MED ORDER — RHO D IMMUNE GLOBULIN 1500 UNIT/2ML IJ SOLN
300.0000 ug | Freq: Once | INTRAMUSCULAR | Status: DC
  Filled 2013-04-21: qty 2

## 2013-04-21 MED ORDER — SENNOSIDES-DOCUSATE SODIUM 8.6-50 MG PO TABS
2.0000 | ORAL_TABLET | Freq: Every day | ORAL | Status: DC
  Administered 2013-04-21 (×2): 2 via ORAL

## 2013-04-21 MED ORDER — RHO D IMMUNE GLOBULIN 1500 UNIT/2ML IJ SOLN
300.0000 ug | Freq: Once | INTRAMUSCULAR | Status: AC
  Administered 2013-04-21: 300 ug via INTRAMUSCULAR
  Filled 2013-04-21: qty 2

## 2013-04-21 MED ORDER — ONDANSETRON HCL 4 MG/2ML IJ SOLN: 4.0000 mg | INTRAMUSCULAR | Status: DC | PRN

## 2013-04-21 MED ORDER — ONDANSETRON HCL 4 MG PO TABS: 4.0000 mg | ORAL_TABLET | ORAL | Status: DC | PRN

## 2013-04-21 MED ORDER — WITCH HAZEL-GLYCERIN EX PADS: 1.0000 "application " | MEDICATED_PAD | CUTANEOUS | Status: DC | PRN

## 2013-04-21 MED ORDER — TETANUS-DIPHTH-ACELL PERTUSSIS 5-2.5-18.5 LF-MCG/0.5 IM SUSP
0.5000 mL | Freq: Once | INTRAMUSCULAR | Status: AC
  Administered 2013-04-21: 0.5 mL via INTRAMUSCULAR
  Filled 2013-04-21 (×2): qty 0.5

## 2013-04-21 MED ORDER — SIMETHICONE 80 MG PO CHEW: 80.0000 mg | CHEWABLE_TABLET | ORAL | Status: DC | PRN

## 2013-04-21 MED ORDER — DIBUCAINE 1 % RE OINT
1.0000 "application " | TOPICAL_OINTMENT | RECTAL | Status: DC | PRN
  Filled 2013-04-21: qty 28

## 2013-04-21 MED ORDER — ZOLPIDEM TARTRATE 5 MG PO TABS: 5.0000 mg | ORAL_TABLET | Freq: Every evening | ORAL | Status: DC | PRN

## 2013-04-21 NOTE — Progress Notes (Signed)
UR chart review completed.  

## 2013-04-21 NOTE — Progress Notes (Signed)
Called to evaluate patient for a headache. She currently has a headache ( she says it's not bad...Marland Kitchenperhaps 3/10 ). She is sitting up in the bed at perhaps 30 degrees, animated and in no obvious distress.   She gave me long story about how before the first epidural she had a headache, it got worse after the first epidural was placed , and then with replacement of the epidural the second time it was absent until she arrived to Mother/Baby.      She describes the HA as diffusly frontal, and no other components of the pain. This is the same HA she had on L&D, with waxing and waning intensity.    Twice when we were talking, she raised herself up to completely sitting up straight.        The story does not fit with a clear indication for Blood Patch. We will evaluate again in the am.

## 2013-04-21 NOTE — Anesthesia Postprocedure Evaluation (Signed)
Anesthesia Post Note  Patient: Patricia [name]  Procedure(s) Performed: * No procedures listed *  Anesthesia type: Epidural  Patient location: Mother/Baby  Post pain: Pain level controlled  Post assessment: Post-op Vital signs reviewed  Last Vitals:  Filed Vitals:   04/21/13 0456  BP: 117/78  Pulse: 92  Temp: 36.9 C  Resp: 18    Post vital signs: Reviewed  Level of consciousness: awake  Complications: C/O headache when sitting, relieved when supine. Spoke with Dr. Sherron Ales and he is going to evaluate.

## 2013-04-21 NOTE — Progress Notes (Signed)
Post Partum Day 1 Subjective: Patient had severe throbbing headache after going to postpartum.  Now much improved (2/10) following motrin. Patient tolerating PO, passing flatus, Up ad lib without difficulty.  Objective: Blood pressure 117/78, pulse 92, temperature 98.5 F (36.9 C), temperature source Oral, resp. rate 18, height 5\' 7"  (1.702 m), weight 131.09 kg (289 lb), last menstrual period 08/10/2012, SpO2 100.00%, unknown if currently breastfeeding.  Physical Exam:  General: alert, cooperative and no distress Lochia: appropriate Uterine Fundus: firm DVT Evaluation: No evidence of DVT seen on physical exam. Negative Homan's sign. No cords or calf tenderness. No significant calf/ankle edema. Neuro: no focal deficits  Recent Labs  04/20/13 1400 04/21/13 0615  HGB 11.3* 9.8*  HCT 34.2* 29.6*    Assessment/Plan: Patient doing well.  Anesthesia evaluated for possible spinal headache.  Unlikely given presentation/course. Plan for discharge tomorrow   LOS: 1 day   Everlene Other 04/21/2013, 8:06 AM   I have seen and examined this patient and agree the above assessment. CRESENZO-DISHMAN,Kayson Tasker 04/21/2013 9:32 AM

## 2013-04-22 LAB — RH IG WORKUP (INCLUDES ABO/RH)
ABO/RH(D): A NEG
Fetal Screen: NEGATIVE
Gestational Age(Wks): 35.3

## 2013-04-22 LAB — TYPE AND SCREEN
ABO/RH(D): A NEG
Unit division: 0

## 2013-04-22 LAB — GC/CHLAMYDIA PROBE AMP: GC Probe RNA: NEGATIVE

## 2013-04-22 NOTE — Discharge Summary (Signed)
Obstetric Discharge Summary Reason for Admission: onset of labor Prenatal Procedures: ultrasound Intrapartum Procedures: spontaneous vaginal delivery Postpartum Procedures: none Complications-Operative and Postpartum: none Hemoglobin  Date Value Range Status  04/21/2013 9.8* 12.0 - 15.0 g/dL Final     HCT  Date Value Range Status  04/21/2013 29.6* 36.0 - 46.0 % Final    Physical Exam:  General: alert, cooperative and no distress Lochia: appropriate Uterine Fundus: firm DVT Evaluation: No evidence of DVT seen on physical exam. HA was relieved when she removed her "Doo Rag" Plans Mirena at 6 weeks  Discharge Diagnoses: Preterm delivery  Discharge Information: Date: 04/22/2013 Activity: pelvic rest Diet: routine Medications: None Condition: stable Instructions: refer to practice specific booklet Discharge to: home   Newborn Data: Live born female  Birth Weight: 6 lb 1 oz (2750 g) APGAR: 8, 9  Home with mother.  Patricia [name] Casino 04/22/2013, 7:18 AM

## 2013-04-22 NOTE — Progress Notes (Signed)
Patient has no headache. She states that not wearing her headwrap relieved the problem until she ate pork and it came back, but only mildly. She is sitting straight up and free of headache presently.

## 2013-04-23 ENCOUNTER — Telehealth: Payer: Self-pay | Admitting: *Deleted

## 2013-04-23 ENCOUNTER — Ambulatory Visit (INDEPENDENT_AMBULATORY_CARE_PROVIDER_SITE_OTHER): Payer: Medicaid Other | Admitting: Obstetrics and Gynecology

## 2013-04-23 ENCOUNTER — Encounter: Payer: Self-pay | Admitting: Obstetrics and Gynecology

## 2013-04-23 VITALS — BP 118/76 | Ht 67.0 in | Wt 277.0 lb

## 2013-04-23 DIAGNOSIS — O26899 Other specified pregnancy related conditions, unspecified trimester: Secondary | ICD-10-CM

## 2013-04-23 DIAGNOSIS — R51 Headache: Secondary | ICD-10-CM

## 2013-04-23 MED ORDER — BUTALBITAL-ASA-CAFFEINE 50-325-40 MG PO CAPS: 2.0000 | ORAL_CAPSULE | Freq: Two times a day (BID) | ORAL | Status: DC | PRN

## 2013-04-23 MED ORDER — OXYCODONE-ACETAMINOPHEN 7.5-325 MG PO TABS: 1.0000 | ORAL_TABLET | ORAL | Status: DC | PRN

## 2013-04-23 NOTE — Progress Notes (Signed)
Patient ID: AMEKA KRIGBAUM, female   DOB: 1991/04/16, 22 y.o.   MRN: [id number] Pt worked in for severe headache since last night. Nothing relieves the pain. Was present on pp day 1 within 5 hours postpartum. Present with sitting and  Supine. No vision changes. Now day 3 of h/a.Marland Kitchen Pt had 2 epidurals placed, first didn't help "at all".  Delivered shortly p second epidural.  NO HTN in preg. No fever No abd pain Lochia lite. Physical Examination: General appearance - alert, well appearing, and in no distress, overweight and anxious Mental status - alert, oriented to person, place, and time, normal mood, behavior, speech, dress, motor activity, and thought processes Eyes - pupils equal and reactive, extraocular eye movements intact Neck - supple, no significant adenopathy Abdomen - soft, nontender, nondistended, no masses or organomegaly Assess: Mild post-epidural headache No suggestio of meningeal symptoms.  Plan: fluids, caffeine, percocet, fiorinal   If no better in 2 days, go to [facility]

## 2013-04-23 NOTE — MAU Provider Note (Signed)
Attestation of Attending Supervision of Advanced Practitioner: Evaluation and management procedures were performed by the PA/NP/CNM/OB Fellow under my supervision/collaboration. Chart reviewed and agree with management and plan.  Philip Eckersley V 04/23/2013 12:32 PM

## 2013-04-23 NOTE — Patient Instructions (Signed)
Meds are at your pharmacy If  Not better in 48 hours, [facility]. If severe worsening, go to [facility] before then

## 2013-04-23 NOTE — Telephone Encounter (Signed)
Pt postpartum delivered 04/20/2013.  C/o severe headache, no relief with ibuprofen, rated at 9 on 1-10 scale. Transferred call to front staff to work pt into schedule today

## 2013-04-24 ENCOUNTER — Ambulatory Visit: Payer: Medicaid Other | Admitting: Obstetrics and Gynecology

## 2013-04-24 ENCOUNTER — Encounter: Payer: Medicaid Other | Admitting: Obstetrics & Gynecology

## 2013-04-25 ENCOUNTER — Telehealth: Payer: Self-pay | Admitting: *Deleted

## 2013-04-25 LAB — CULTURE, BETA STREP (GROUP B ONLY)

## 2013-04-29 ENCOUNTER — Telehealth: Payer: Self-pay | Admitting: Obstetrics and Gynecology

## 2013-04-29 NOTE — Telephone Encounter (Signed)
2 notes given, one for pt to pick up and one that was faxed to [facility]. JSY

## 2013-04-29 NOTE — Telephone Encounter (Signed)
Needs note for work saying she delivered 04/20/13. Plans on being out x 6 weeks, that would put her going back to work around June 04, 2013. Fax # [phone] Attention: Cathy [facility].

## 2013-05-01 ENCOUNTER — Emergency Department (HOSPITAL_COMMUNITY)
Admission: EM | Admit: 2013-05-01 | Discharge: 2013-05-01 | Disposition: A | Discharge status: Home / Self Care | Payer: No Typology Code available for payment source | Attending: Emergency Medicine | Admitting: Emergency Medicine

## 2013-05-01 ENCOUNTER — Encounter (HOSPITAL_COMMUNITY): Payer: Self-pay | Admitting: Emergency Medicine

## 2013-05-01 DIAGNOSIS — Z8751 Personal history of pre-term labor: Secondary | ICD-10-CM | POA: Insufficient documentation

## 2013-05-01 DIAGNOSIS — S39012A Strain of muscle, fascia and tendon of lower back, initial encounter: Secondary | ICD-10-CM

## 2013-05-01 DIAGNOSIS — G43909 Migraine, unspecified, not intractable, without status migrainosus: Secondary | ICD-10-CM | POA: Insufficient documentation

## 2013-05-01 DIAGNOSIS — S335XXA Sprain of ligaments of lumbar spine, initial encounter: Secondary | ICD-10-CM | POA: Insufficient documentation

## 2013-05-01 DIAGNOSIS — Z8619 Personal history of other infectious and parasitic diseases: Secondary | ICD-10-CM | POA: Insufficient documentation

## 2013-05-01 DIAGNOSIS — Z79899 Other long term (current) drug therapy: Secondary | ICD-10-CM | POA: Insufficient documentation

## 2013-05-01 DIAGNOSIS — Y92410 Unspecified street and highway as the place of occurrence of the external cause: Secondary | ICD-10-CM | POA: Insufficient documentation

## 2013-05-01 DIAGNOSIS — M545 Low back pain, unspecified: Secondary | ICD-10-CM | POA: Insufficient documentation

## 2013-05-01 DIAGNOSIS — Z87440 Personal history of urinary (tract) infections: Secondary | ICD-10-CM | POA: Insufficient documentation

## 2013-05-01 DIAGNOSIS — Y9389 Activity, other specified: Secondary | ICD-10-CM | POA: Insufficient documentation

## 2013-05-01 HISTORY — DX: Migraine, unspecified, not intractable, without status migrainosus: G43.909
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
## 2013-05-01 NOTE — ED Notes (Signed)
Patient with no complaints at this time. Respirations even and unlabored. Skin warm/dry. Discharge instructions reviewed with patient at this time. Patient given opportunity to voice concerns/ask questions. Patient discharged at this time and left Emergency Department with steady gait.   

## 2013-05-06 NOTE — Telephone Encounter (Signed)
Messages left.

## 2013-05-14 ENCOUNTER — Ambulatory Visit: Payer: Medicaid Other | Admitting: Obstetrics and Gynecology

## 2013-05-27 ENCOUNTER — Ambulatory Visit: Payer: Medicaid Other | Admitting: Advanced Practice Midwife

## 2013-05-27 DIAGNOSIS — Z029 Encounter for administrative examinations, unspecified: Secondary | ICD-10-CM

## 2013-05-28 ENCOUNTER — Telehealth: Payer: Self-pay | Admitting: Obstetrics and Gynecology

## 2013-05-28 NOTE — Telephone Encounter (Signed)
Spoke with pt. Delivered 04/20/13. Stopped bleeding on 04/22/13. Has had no bleeding since then. Started bleeding heavy this am. Some cramping. Advised everything sounds normal, and to expect a heavy period for the first few months. Advised to use Ibuprofen for cramping. Pt has a scheduled appt tomorrow. JSY

## 2013-05-29 ENCOUNTER — Encounter: Payer: Self-pay | Admitting: Obstetrics and Gynecology

## 2013-05-29 ENCOUNTER — Ambulatory Visit (INDEPENDENT_AMBULATORY_CARE_PROVIDER_SITE_OTHER): Payer: Medicaid Other | Admitting: Obstetrics and Gynecology

## 2013-05-29 DIAGNOSIS — Z309 Encounter for contraceptive management, unspecified: Secondary | ICD-10-CM | POA: Insufficient documentation

## 2013-05-29 NOTE — Progress Notes (Signed)
Patient ID: Patricia [name], female   DOB: 12-02-1990, 22 y.o.   MRN: [id number] Subjective:     Patricia [name] is a 22 y.o. female who presents for a postpartum visit. She is 6 week postpartum following a spontaneous vaginal delivery. I have fully reviewed the prenatal and intrapartum course. The delivery was at 36 gestational weeks. Outcome: spontaneous vaginal delivery. Anesthesia: local. Postpartum course has been normal . Baby's course has been uncomplicated. Baby is feeding by breast. Bleeding moderate lochia. Bowel function is normal. Bladder function is normal. Patient is not sexually active. Contraception method is IUD. Postpartum depression screening: negative.  The following portions of the patient's history were reviewed and updated as appropriate: allergies, current medications, past family history, past medical history, past social history, past surgical history and problem list.  [name] Pertinent items are noted in HPI.   Objective:    BP 110/74  Ht 5\' 7"  (1.702 m)  Wt 285 lb (129.275 kg)  BMI 44.63 kg/m2  LMP 05/28/2013  Breastfeeding? No  General:  no distress   Breasts:  inspection negative, no nipple discharge or bleeding, no masses or nodularity palpable  Lungs:   Heart:    Abdomen: soft, non-tender; bowel sounds normal; no masses,  no organomegaly   Vulva:  normal  Vagina: normal vagina  Cervix:  multiparous appearance  Corpus: normal  Adnexa:  normal adnexa  Rectal Exam: Not performed.        Assessment:     normal postpartum exam On menses at today's visit.   Plan:    1. Contraception: IUD 2. appt next week 3. Follow up in: 1 week or as needed.

## 2013-06-03 ENCOUNTER — Encounter: Payer: Self-pay | Admitting: Advanced Practice Midwife

## 2013-06-03 ENCOUNTER — Ambulatory Visit (INDEPENDENT_AMBULATORY_CARE_PROVIDER_SITE_OTHER): Payer: Medicaid Other | Admitting: Advanced Practice Midwife

## 2013-06-03 VITALS — BP 110/80 | Ht 67.0 in | Wt 287.0 lb

## 2013-06-03 DIAGNOSIS — Z975 Presence of (intrauterine) contraceptive device: Secondary | ICD-10-CM

## 2013-06-03 DIAGNOSIS — Z3202 Encounter for pregnancy test, result negative: Secondary | ICD-10-CM

## 2013-06-03 DIAGNOSIS — Z30430 Encounter for insertion of intrauterine contraceptive device: Secondary | ICD-10-CM

## 2013-06-03 LAB — POCT URINE PREGNANCY: Preg Test, Ur: NEGATIVE

## 2013-06-03 NOTE — Progress Notes (Signed)
Patricia [name] is a 22 y.o. year old African American female Gravida 1 Para 1  who presents for placement of a Mirena IUD.  The risks and benefits of the method and placement have been thouroughly reviewed with the patient and all questions were answered.  Specifically the patient is aware of failure rate of 11/998, expulsion of the IUD and of possible perforation.  The patient is aware of irregular bleeding due to the method and understands the incidence of irregular bleeding diminishes with time.  Time out was performed.  A Graves speculum was placed.  The cervix was prepped using Betadine. The uterus was found to be neutral and it sounded to 8 cm.  The cervix was grasped with a tenaculum and the IUD was inserted to 8 cm.  It was pulled back 1 cm and the IUD was disengaged.  The strings were trimmed to 3 cm.  Sonogram was performed and the proper placement of the IUD was verified.  The patient was instructed on signs and symptoms of infection and to check for the strings after each menses or each month.  The patient is to refrain from intercourse for 3 days.  The patient is scheduled for a return appointment after her first menses or 4 weeks.  CRESENZO-DISHMAN,Vermon Grays 06/03/2013 11:58 AM

## 2013-06-03 NOTE — Patient Instructions (Addendum)
Levonorgestrel intrauterine device (IUD) What is this medicine? LEVONORGESTREL IUD (LEE voe nor jes trel) is a contraceptive (birth control) device. The device is placed inside the uterus by a healthcare professional. It is used to prevent pregnancy and can also be used to treat heavy bleeding that occurs during your period. Depending on the device, it can be used for 3 to 5 years. This medicine may be used for other purposes; ask your health care provider or pharmacist if you have questions. What should I tell my health care provider before I take this medicine? They need to know if you have any of these conditions: -abnormal Pap smear -cancer of the breast, uterus, or cervix -diabetes -endometritis -genital or pelvic infection now or in the past -have more than one sexual partner or your partner has more than one partner -heart disease -history of an ectopic or tubal pregnancy -immune system problems -IUD in place -liver disease or tumor -problems with blood clots or take blood-thinners -use intravenous drugs -uterus of unusual shape -vaginal bleeding that has not been explained -an unusual or allergic reaction to levonorgestrel, other hormones, silicone, or polyethylene, medicines, foods, dyes, or preservatives -pregnant or trying to get pregnant -breast-feeding How should I use this medicine? This device is placed inside the uterus by a health care professional. Talk to your pediatrician regarding the use of this medicine in children. Special care may be needed. Overdosage: If you think you have taken too much of this medicine contact a poison control center or emergency room at once. NOTE: This medicine is only for you. Do not share this medicine with others. What if I miss a dose? This does not apply. What may interact with this medicine? Do not take this medicine with any of the following medications: -amprenavir -bosentan -fosamprenavir This medicine may also interact with  the following medications: -aprepitant -barbiturate medicines for inducing sleep or treating seizures -bexarotene -griseofulvin -medicines to treat seizures like carbamazepine, ethotoin, felbamate, oxcarbazepine, phenytoin, topiramate -modafinil -pioglitazone -rifabutin -rifampin -rifapentine -some medicines to treat HIV infection like atazanavir, indinavir, lopinavir, nelfinavir, tipranavir, ritonavir -St. John's wort -warfarin This list may not describe all possible interactions. Give your health care provider a list of all the medicines, herbs, non-prescription drugs, or dietary supplements you use. Also tell them if you smoke, drink alcohol, or use illegal drugs. Some items may interact with your medicine. What should I watch for while using this medicine? Visit your doctor or health care professional for regular check ups. See your doctor if you or your partner has sexual contact with others, becomes HIV positive, or gets a sexual transmitted disease. This product does not protect you against HIV infection (AIDS) or other sexually transmitted diseases. You can check the placement of the IUD yourself by reaching up to the top of your vagina with clean fingers to feel the threads. Do not pull on the threads. It is a good habit to check placement after each menstrual period. Call your doctor right away if you feel more of the IUD than just the threads or if you cannot feel the threads at all. The IUD may come out by itself. You may become pregnant if the device comes out. If you notice that the IUD has come out use a backup birth control method like condoms and call your health care provider. Using tampons will not change the position of the IUD and are okay to use during your period. What side effects may I notice from receiving this medicine?   Side effects that you should report to your doctor or health care professional as soon as possible: -allergic reactions like skin rash, itching or  hives, swelling of the face, lips, or tongue -fever, flu-like symptoms -genital sores -high blood pressure -no menstrual period for 6 weeks during use -pain, swelling, warmth in the leg -pelvic pain or tenderness -severe or sudden headache -signs of pregnancy -stomach cramping -sudden shortness of breath -trouble with balance, talking, or walking -unusual vaginal bleeding, discharge -yellowing of the eyes or skin Side effects that usually do not require medical attention (report to your doctor or health care professional if they continue or are bothersome): -acne -breast pain -change in sex drive or performance -changes in weight -cramping, dizziness, or faintness while the device is being inserted -headache -irregular menstrual bleeding within first 3 to 6 months of use -nausea This list may not describe all possible side effects. Call your doctor for medical advice about side effects. You may report side effects to FDA at 1-800-FDA-1088. Where should I keep my medicine? This does not apply. NOTE: This sheet is a summary. It may not cover all possible information. If you have questions about this medicine, talk to your doctor, pharmacist, or health care provider.  2013, [employer]. (12/14/2011 1:54:04 PM   Nothing in vagina for 3 days (intercourse, tampons) Check strings q month and/or after a heavy bleeding episode

## 2013-07-01 ENCOUNTER — Ambulatory Visit: Payer: Medicaid Other | Admitting: Adult Health

## 2013-07-28 ENCOUNTER — Emergency Department (HOSPITAL_COMMUNITY)
Admission: EM | Admit: 2013-07-28 | Discharge: 2013-07-28 | Disposition: A | Discharge status: Home / Self Care | Payer: Medicaid Other | Attending: Emergency Medicine | Admitting: Emergency Medicine

## 2013-07-28 ENCOUNTER — Encounter (HOSPITAL_COMMUNITY): Payer: Self-pay | Admitting: *Deleted

## 2013-07-28 DIAGNOSIS — J029 Acute pharyngitis, unspecified: Secondary | ICD-10-CM | POA: Insufficient documentation

## 2013-07-28 DIAGNOSIS — J069 Acute upper respiratory infection, unspecified: Secondary | ICD-10-CM

## 2013-07-28 DIAGNOSIS — Z8679 Personal history of other diseases of the circulatory system: Secondary | ICD-10-CM | POA: Insufficient documentation

## 2013-07-28 DIAGNOSIS — Z87440 Personal history of urinary (tract) infections: Secondary | ICD-10-CM | POA: Insufficient documentation

## 2013-07-28 DIAGNOSIS — Z8742 Personal history of other diseases of the female genital tract: Secondary | ICD-10-CM | POA: Insufficient documentation

## 2013-07-28 DIAGNOSIS — Z8619 Personal history of other infectious and parasitic diseases: Secondary | ICD-10-CM | POA: Insufficient documentation

## 2013-07-28 NOTE — ED Notes (Signed)
Cough, sore throat, headache.

## 2013-07-29 NOTE — ED Provider Notes (Signed)
CSN: [id number]     Arrival date & time 07/28/13  1950 History   First MD Initiated Contact with Patient 07/28/13 2017     Chief Complaint  Patient presents with  . Cough   (Consider location/radiation/quality/duration/timing/severity/associated sxs/prior Treatment) HPI Comments: Patricia [name] is a 22 y.o. female     Patient is a 22 y.o. female presenting with URI. The history is provided by the patient.  URI Presenting symptoms: cough, rhinorrhea and sore throat   Presenting symptoms: no congestion and no fever   Severity:  Moderate Onset quality:  Gradual Duration:  1 day Timing:  Constant Progression:  Unchanged Relieved by:  OTC medications (robitussin) Worsened by:  Nothing tried Ineffective treatments:  None tried Associated symptoms: sneezing   Associated symptoms: no arthralgias, no headaches, no myalgias, no neck pain, no sinus pain and no swollen glands   Associated symptoms comment:  She denies shortness of breath, chest pain, wheezing, dizziness and weakness. Risk factors: sick contacts   Risk factors comment:  Both her 2 children and a coworker have similar symptoms   Past Medical History  Diagnosis Date  . Preterm labor without delivery in second trimester June 2012    Positive FFN in June  . UTI (lower urinary tract infection)   . Bacterial vaginosis   . Chlamydia   . Migraine    Past Surgical History  Procedure Laterality Date  . No past surgeries     Family History  Problem Relation Age of Onset  . Diabetes Other     great granmother   History  Substance Use Topics  . Smoking status: Never Smoker   . Smokeless tobacco: Never Used  . Alcohol Use: No   OB History   Grav Para Term Preterm Abortions TAB SAB Ect Mult Living   2 2 1 1      2      [name]  Constitutional: Negative for fever and chills.  HENT: Positive for sore throat, rhinorrhea and sneezing. Negative for congestion and neck pain.   Eyes: Negative.   Respiratory:  Positive for cough. Negative for chest tightness and shortness of breath.   Cardiovascular: Negative for chest pain.  Gastrointestinal: Negative for nausea and abdominal pain.  Genitourinary: Negative.   Musculoskeletal: Negative for myalgias, joint swelling and arthralgias.  Skin: Negative.  Negative for rash and wound.  Neurological: Negative for dizziness, weakness, light-headedness, numbness and headaches.  Psychiatric/Behavioral: Negative.     Allergies  Review of patient's allergies indicates no known allergies.  Home Medications  No current outpatient prescriptions on file. BP 130/73  Pulse 84  Temp(Src) 98.9 F (37.2 C) (Oral)  Resp 20  Ht 5\' 7"  (1.702 m)  Wt 275 lb (124.739 kg)  BMI 43.06 kg/m2  SpO2 100%  LMP 06/27/2013  Breastfeeding? No Physical Exam  Constitutional: She is oriented to person, place, and time. She appears well-developed and well-nourished.  HENT:  Head: Normocephalic and atraumatic.  Right Ear: Tympanic membrane, external ear and ear canal normal.  Left Ear: Tympanic membrane, external ear and ear canal normal.  Nose: Mucosal edema and rhinorrhea present.  Mouth/Throat: Uvula is midline, oropharynx is clear and moist and mucous membranes are normal. No oropharyngeal exudate, posterior oropharyngeal edema, posterior oropharyngeal erythema or tonsillar abscesses.  Eyes: Conjunctivae are normal.  Cardiovascular: Normal rate and normal heart sounds.   Pulmonary/Chest: Effort normal. No respiratory distress. She has no decreased breath sounds. She has no wheezes. She has no rhonchi. She  has no rales.  Abdominal: Soft. There is no tenderness.  Musculoskeletal: Normal range of motion.  Neurological: She is alert and oriented to person, place, and time.  Skin: Skin is warm and dry. No rash noted.  Psychiatric: She has a normal mood and affect.    ED Course  Procedures (including critical care time) Labs Review Labs Reviewed - No data to  display Imaging Review No results found.  MDM   1. URI (upper respiratory infection)    Encouraged rest, increased fluids. Continue robitussin, may add ibuprofen for throat pain.  Work note given.  The patient appears reasonably screened and/or stabilized for discharge and I doubt any other medical condition or other [facility] requiring further screening, evaluation, or treatment in the ED at this time prior to discharge.     Burgess Amor, PA-C 07/29/13 1303

## 2013-07-29 NOTE — ED Provider Notes (Signed)
Medical screening examination/treatment/procedure(s) were performed by non-physician practitioner and as supervising physician I was immediately available for consultation/collaboration.   Laray Anger, DO 07/29/13 1510

## 2013-08-19 ENCOUNTER — Encounter (HOSPITAL_COMMUNITY): Payer: Self-pay

## 2013-08-19 ENCOUNTER — Emergency Department (HOSPITAL_COMMUNITY)
Admission: EM | Admit: 2013-08-19 | Discharge: 2013-08-19 | Disposition: A | Discharge status: Home / Self Care | Payer: Medicaid Other | Attending: Emergency Medicine | Admitting: Emergency Medicine

## 2013-08-19 DIAGNOSIS — M549 Dorsalgia, unspecified: Secondary | ICD-10-CM

## 2013-08-19 DIAGNOSIS — R197 Diarrhea, unspecified: Secondary | ICD-10-CM | POA: Insufficient documentation

## 2013-08-19 DIAGNOSIS — Z8751 Personal history of pre-term labor: Secondary | ICD-10-CM | POA: Insufficient documentation

## 2013-08-19 DIAGNOSIS — N760 Acute vaginitis: Secondary | ICD-10-CM | POA: Insufficient documentation

## 2013-08-19 DIAGNOSIS — Z3202 Encounter for pregnancy test, result negative: Secondary | ICD-10-CM | POA: Insufficient documentation

## 2013-08-19 DIAGNOSIS — Z8619 Personal history of other infectious and parasitic diseases: Secondary | ICD-10-CM | POA: Insufficient documentation

## 2013-08-19 DIAGNOSIS — B373 Candidiasis of vulva and vagina: Secondary | ICD-10-CM

## 2013-08-19 DIAGNOSIS — M545 Low back pain, unspecified: Secondary | ICD-10-CM | POA: Insufficient documentation

## 2013-08-19 DIAGNOSIS — B3731 Acute candidiasis of vulva and vagina: Secondary | ICD-10-CM

## 2013-08-19 DIAGNOSIS — Z87440 Personal history of urinary (tract) infections: Secondary | ICD-10-CM | POA: Insufficient documentation

## 2013-08-19 DIAGNOSIS — Z8679 Personal history of other diseases of the circulatory system: Secondary | ICD-10-CM | POA: Insufficient documentation

## 2013-08-19 DIAGNOSIS — B9689 Other specified bacterial agents as the cause of diseases classified elsewhere: Secondary | ICD-10-CM

## 2013-08-19 LAB — URINALYSIS, ROUTINE W REFLEX MICROSCOPIC
Glucose, UA: NEGATIVE mg/dL
Leukocytes, UA: NEGATIVE
Nitrite: NEGATIVE
Specific Gravity, Urine: 1.015 (ref 1.005–1.030)
pH: 7 (ref 5.0–8.0)

## 2013-08-19 LAB — POCT PREGNANCY, URINE: Preg Test, Ur: NEGATIVE

## 2013-08-19 LAB — WET PREP, GENITAL

## 2013-08-19 MED ORDER — FLUCONAZOLE 200 MG PO TABS: 100.0000 mg | ORAL_TABLET | Freq: Every day | ORAL | Status: AC

## 2013-08-19 MED ORDER — IBUPROFEN 800 MG PO TABS: 800.0000 mg | ORAL_TABLET | Freq: Three times a day (TID) | ORAL | Status: DC

## 2013-08-19 MED ORDER — LIDOCAINE HCL (PF) 1 % IJ SOLN
INTRAMUSCULAR | Status: AC
  Administered 2013-08-19: 0.9 mL
  Filled 2013-08-19: qty 5

## 2013-08-19 MED ORDER — AZITHROMYCIN 250 MG PO TABS
1000.0000 mg | ORAL_TABLET | Freq: Once | ORAL | Status: AC
  Administered 2013-08-19: 1000 mg via ORAL
  Filled 2013-08-19: qty 4

## 2013-08-19 MED ORDER — HYDROCODONE-ACETAMINOPHEN 5-325 MG PO TABS: 1.0000 | ORAL_TABLET | Freq: Four times a day (QID) | ORAL | Status: DC | PRN

## 2013-08-19 MED ORDER — CEFTRIAXONE SODIUM 250 MG IJ SOLR
250.0000 mg | Freq: Once | INTRAMUSCULAR | Status: AC
  Administered 2013-08-19: 250 mg via INTRAMUSCULAR
  Filled 2013-08-19: qty 250

## 2013-08-19 MED ORDER — METRONIDAZOLE 500 MG PO TABS: 500.0000 mg | ORAL_TABLET | Freq: Two times a day (BID) | ORAL | Status: DC

## 2013-08-19 NOTE — ED Notes (Signed)
Pt c/o pain in lower back x 4 months.  Says has had pain ever since having epidural with the delivery of her last child.  C/O pelvic pain that started this morning.  Denies any abnormal vaginal bleeding or discharge.  LMP was Aug 8th.

## 2013-08-19 NOTE — ED Provider Notes (Signed)
This chart was scribed for Patricia Maw Rosaleen Mazer, DO by Dorothey Baseman, ED Scribe. This patient was seen in room APA18/APA18 and the patient's care was started at 12:20 PM.   TIME SEEN: 12:20PM  CHIEF COMPLAINT: back pain, pelvic pain   HPI:  HPI Comments: Patricia [name] is a 22 y.o. G2P2 female who presents to the Emergency Department complaining of intermittent back pain exacerbated with movement that has been progressively worsening for the past 4 months. No alleviating factors. She denies any numbness, weakness. No history of back injury. She also reports a sharp pelvic pain onset this morning without radiation.. She reports diarrhea. She denies fever, nausea, emesis, hematuria, vaginal discharge, bowel or bladder incontinence. Patient reports that she is currently sexually active with one partner and does not use protection. She denies any history of STDs or abdominal surgeries. Patient reports that she gave birth on 04/20/2013, her second pregnancy.    ROS: See HPI Constitutional: no fever  Eyes: no drainage  ENT: no runny nose   Cardiovascular:  no chest pain  Resp: no SOB  GI: no vomiting GU: no dysuria Integumentary: no rash  Allergy: no hives  Musculoskeletal: no leg swelling  Neurological: no slurred speech ROS otherwise negative  PAST MEDICAL HISTORY/PAST SURGICAL HISTORY:  Past Medical History  Diagnosis Date  . Preterm labor without delivery in second trimester June 2012    Positive FFN in June  . UTI (lower urinary tract infection)   . Bacterial vaginosis   . Chlamydia   . Migraine     MEDICATIONS:  Prior to Admission medications   Not on File    ALLERGIES:  No Known Allergies  SOCIAL HISTORY:  History  Substance Use Topics  . Smoking status: Never Smoker   . Smokeless tobacco: Never Used  . Alcohol Use: No    FAMILY HISTORY: Family History  Problem Relation Age of Onset  . Diabetes Other     great granmother    EXAM:  Triage Vitals: BP 123/67  Pulse  100  Temp(Src) 98.6 F (37 C) (Oral)  Resp 18  Ht 5\' 7"  (1.702 m)  Wt 279 lb (126.554 kg)  BMI 43.69 kg/m2  SpO2 100%  LMP 07/04/2013  Breastfeeding? No  CONSTITUTIONAL: Alert and oriented and responds appropriately to questions. Well-appearing; well-nourished HEAD: Normocephalic EYES: Conjunctivae clear, PERRL ENT: normal nose; no rhinorrhea; moist mucous membranes; pharynx without lesions noted NECK: Supple, no meningismus, no LAD  CARD: RRR; S1 and S2 appreciated; no murmurs, no clicks, no rubs, no gallops RESP: Normal chest excursion without splinting or tachypnea; breath sounds clear and equal bilaterally; no wheezes, no rhonchi, no rales,  ABD/GI: Normal bowel sounds; non-distended; soft, non-tender, no rebound, no guarding GU:  Genitalia normal, no cervical motion tenderness, no adnexal tenderness or fullness, no vaginal bleeding, patient does have a copious amount of yellow, thin vaginal discharge BACK:  The back appears normal and has diffuse lumbar tenderness without step-off or deformity, no midline spinal tenderness, there is no CVA tenderness EXT: Normal ROM in all joints; non-tender to palpation; no edema; normal capillary refill; no cyanosis    SKIN: Normal color for age and race; warm NEURO: Moves all extremities equally. Normal sensation to light touch to all 4 extremities. Gait normal. Strength 5/5 in all 4 extremities. Normal reflexes bilaterally. PSYCH: The patient's mood and manner are appropriate. Grooming and personal hygiene are appropriate.  MEDICAL DECISION MAKING: 12:23PM- suspect patient's back pain is related to muscle spasm, muscle  strain. No midline spinal tenderness. No neurologic deficits. I am not concerned for discitis, transverse myelitis, epidural abscess, cauda equina. Patient's abdominal exam is also benign. Will perform pelvic exam with cultures. We'll obtain urine, urine pregnancy. Given her benign exam, do not feel she needs imaging at this time.  Discussed treatment plan with patient at bedside and patient verbalized agreement.    ED PROGRESS: Patient's wet prep shows placed, bacterial vaginosis and many white blood cells. Given her history of unprotected sex approximately 2 weeks ago and copious amount of vaginal discharge, will empirically treat with ceftriaxone and azithromycin. Explained to patient that her gonorrhea and Chlamydia cultures are pending and if positive she will need to have her partners treated as well. We'll discharge home with Flagyl and Diflucan. Given return precautions. Patient verbalized understanding and is comfortable with plan.    I personally performed the services described in this documentation, which was scribed in my presence. The recorded information has been reviewed and is accurate.   Patricia Maw Aizlynn Digilio, DO 08/19/13 1626

## 2013-08-20 LAB — GC/CHLAMYDIA PROBE AMP
CT Probe RNA: NEGATIVE
GC Probe RNA: NEGATIVE

## 2013-10-02 ENCOUNTER — Other Ambulatory Visit: Payer: Self-pay

## 2013-10-22 ENCOUNTER — Encounter (HOSPITAL_COMMUNITY): Payer: Self-pay | Admitting: Emergency Medicine

## 2013-10-22 ENCOUNTER — Emergency Department (HOSPITAL_COMMUNITY)
Admission: EM | Admit: 2013-10-22 | Discharge: 2013-10-22 | Disposition: A | Discharge status: Home / Self Care | Payer: Medicaid Other | Attending: Emergency Medicine | Admitting: Emergency Medicine

## 2013-10-22 DIAGNOSIS — Z8742 Personal history of other diseases of the female genital tract: Secondary | ICD-10-CM | POA: Insufficient documentation

## 2013-10-22 DIAGNOSIS — R197 Diarrhea, unspecified: Secondary | ICD-10-CM | POA: Insufficient documentation

## 2013-10-22 DIAGNOSIS — E860 Dehydration: Secondary | ICD-10-CM

## 2013-10-22 DIAGNOSIS — Z87440 Personal history of urinary (tract) infections: Secondary | ICD-10-CM | POA: Insufficient documentation

## 2013-10-22 DIAGNOSIS — R112 Nausea with vomiting, unspecified: Secondary | ICD-10-CM

## 2013-10-22 DIAGNOSIS — Z79899 Other long term (current) drug therapy: Secondary | ICD-10-CM | POA: Insufficient documentation

## 2013-10-22 DIAGNOSIS — R109 Unspecified abdominal pain: Secondary | ICD-10-CM

## 2013-10-22 DIAGNOSIS — Z8619 Personal history of other infectious and parasitic diseases: Secondary | ICD-10-CM | POA: Insufficient documentation

## 2013-10-22 DIAGNOSIS — Z8679 Personal history of other diseases of the circulatory system: Secondary | ICD-10-CM | POA: Insufficient documentation

## 2013-10-22 DIAGNOSIS — R51 Headache: Secondary | ICD-10-CM | POA: Insufficient documentation

## 2013-10-22 DIAGNOSIS — R519 Headache, unspecified: Secondary | ICD-10-CM

## 2013-10-22 LAB — URINALYSIS, ROUTINE W REFLEX MICROSCOPIC
Bilirubin Urine: NEGATIVE
Ketones, ur: NEGATIVE mg/dL
Leukocytes, UA: NEGATIVE
Specific Gravity, Urine: 1.02 (ref 1.005–1.030)
pH: 5.5 (ref 5.0–8.0)

## 2013-10-22 LAB — POCT I-STAT, CHEM 8
BUN: 9 mg/dL (ref 6–23)
Calcium, Ion: 1.28 mmol/L — ABNORMAL HIGH (ref 1.12–1.23)
Creatinine, Ser: 1.2 mg/dL — ABNORMAL HIGH (ref 0.50–1.10)
Glucose, Bld: 83 mg/dL (ref 70–99)
HCT: 44 % (ref 36.0–46.0)
Potassium: 3.9 mEq/L (ref 3.5–5.1)
Sodium: 148 mEq/L — ABNORMAL HIGH (ref 135–145)
TCO2: 24 mmol/L (ref 0–100)

## 2013-10-22 LAB — BASIC METABOLIC PANEL
BUN: 10 mg/dL (ref 6–23)
CO2: 27 mEq/L (ref 19–32)
Chloride: 103 mEq/L (ref 96–112)
Creatinine, Ser: 1 mg/dL (ref 0.50–1.10)
Glucose, Bld: 75 mg/dL (ref 70–99)
Potassium: 3.7 mEq/L (ref 3.5–5.1)

## 2013-10-22 LAB — URINE MICROSCOPIC-ADD ON

## 2013-10-22 MED ORDER — METOCLOPRAMIDE HCL 5 MG/ML IJ SOLN
10.0000 mg | Freq: Once | INTRAMUSCULAR | Status: AC
  Administered 2013-10-22: 10 mg via INTRAVENOUS
  Filled 2013-10-22: qty 2

## 2013-10-22 MED ORDER — DIPHENHYDRAMINE HCL 50 MG/ML IJ SOLN
25.0000 mg | Freq: Once | INTRAMUSCULAR | Status: AC
  Administered 2013-10-22: 25 mg via INTRAVENOUS
  Filled 2013-10-22: qty 1

## 2013-10-22 MED ORDER — SODIUM CHLORIDE 0.9 % IV BOLUS (SEPSIS)
1000.0000 mL | Freq: Once | INTRAVENOUS | Status: AC
  Administered 2013-10-22: 1000 mL via INTRAVENOUS

## 2013-10-22 MED ORDER — ONDANSETRON HCL 4 MG/2ML IJ SOLN
4.0000 mg | Freq: Once | INTRAMUSCULAR | Status: AC
  Administered 2013-10-22: 4 mg via INTRAMUSCULAR
  Filled 2013-10-22: qty 2

## 2013-10-22 MED ORDER — ONDANSETRON HCL 8 MG PO TABS: 8.0000 mg | ORAL_TABLET | Freq: Three times a day (TID) | ORAL | Status: DC | PRN

## 2013-10-22 MED ORDER — SODIUM CHLORIDE 0.9 % IV SOLN: 1000.0000 mL | INTRAVENOUS | Status: DC

## 2013-10-22 MED ORDER — SODIUM CHLORIDE 0.9 % IV SOLN
1000.0000 mL | Freq: Once | INTRAVENOUS | Status: AC
  Administered 2013-10-22: 1000 mL via INTRAVENOUS

## 2013-10-22 MED ORDER — LOPERAMIDE HCL 2 MG PO CAPS
4.0000 mg | ORAL_CAPSULE | Freq: Once | ORAL | Status: AC
  Administered 2013-10-22: 4 mg via ORAL
  Filled 2013-10-22: qty 2

## 2013-10-22 NOTE — ED Notes (Signed)
Onset 3 am, with abd pain, NVD, headache.

## 2013-10-22 NOTE — ED Provider Notes (Signed)
CSN: [id number]     Arrival date & time 10/22/13  1514 History  This chart was scribed for Ward Givens, MD by Shari Heritage, ED Scribe. The patient was seen in room APA18/APA18. Patient's care was started at 3:35 PM.      Chief Complaint  Patient presents with  . Abdominal Pain    The history is provided by the patient. No language interpreter was used.    HPI Comments: Patricia [name] is a 22 y.o. Female who presents to the Emergency Department complaining of constant right lateral abdominal pain onset this morning at 3 AM (12.5 hours ago) waking her from sleep. Pain is not improved after bowel movements. There is associated nausea, vomiting and diarrhea. She reports 10 episodes of non-bloody emesis and 12 episodes of diarrhea. She describes her stools as watery and non-bloody. She denies fever, dizziness, and lightheadedness. She says that she has been drinking water and eating saltines, but food is usually followed by an episode of vomiting at least 1 hour later. She is also complaining of a bitemporal headache at this time. She states that she has a history of migraines since receiving an epidural during the delivery of her second child who is 68 months old. She reports that she gets similar headaches about once a month and she usually takes Tylenol or ibuprofen for pain relief. She states that her son became sick yesterday with vomiting and he currently attends daycare.   PCP - Kizzie Furnish, [facility] Department   Past Medical History  Diagnosis Date  . Preterm labor without delivery in second trimester June 2012    Positive FFN in June  . UTI (lower urinary tract infection)   . Bacterial vaginosis   . Chlamydia   . Migraine    Past Surgical History  Procedure Laterality Date  . No past surgeries     Family History  Problem Relation Age of Onset  . Diabetes Other     great granmother   History  Substance Use Topics  . Smoking status: Never Smoker   .  Smokeless tobacco: Never Used  . Alcohol Use: No  She works as a Conservation officer, nature at [employer]. Children go to daycare   OB History   Grav Para Term Preterm Abortions TAB SAB Ect Mult Living   2 2 1 1      2      [name]  Constitutional: Negative for fever.  Gastrointestinal: Positive for nausea, vomiting, abdominal pain and diarrhea.  Neurological: Positive for headaches. Negative for dizziness and light-headedness.  All other systems reviewed and are negative.    Allergies  Review of patient's allergies indicates no known allergies.  Home Medications   Current Outpatient Rx  Name  Route  Sig  Dispense  Refill  . HYDROcodone-acetaminophen (NORCO/VICODIN) 5-325 MG per tablet   Oral   Take 1 tablet by mouth every 6 (six) hours as needed for pain.   10 tablet   0   . ibuprofen (ADVIL,MOTRIN) 800 MG tablet   Oral   Take 1 tablet (800 mg total) by mouth 3 (three) times daily.   21 tablet   0   . metroNIDAZOLE (FLAGYL) 500 MG tablet   Oral   Take 1 tablet (500 mg total) by mouth 2 (two) times daily.   14 tablet   0   . Prenatal Vit-Fe Fumarate-FA (PRENATAL MULTIVITAMIN) TABS tablet   Oral   Take 1 tablet by mouth daily at 12  noon.          Triage Vitals: BP 108/86  Pulse 92  Temp(Src) 98.2 F (36.8 C) (Oral)  Resp 20  Ht 5\' 7"  (1.702 m)  Wt 279 lb (126.554 kg)  BMI 43.69 kg/m2  SpO2 99%  LMP 07/22/2013  Breastfeeding? No  Laboratory interpretation all normal except   Physical Exam  Nursing note and vitals reviewed. Constitutional: She is oriented to person, place, and time. She appears well-developed and well-nourished.  Non-toxic appearance. She does not appear ill. No distress.  HENT:  Head: Normocephalic and atraumatic.  Right Ear: External ear normal.  Left Ear: External ear normal.  Nose: Nose normal. No mucosal edema or rhinorrhea.  Mouth/Throat: Oropharynx is clear and moist and mucous membranes are normal. No dental abscesses or uvula swelling.   Eyes: Conjunctivae and EOM are normal. Pupils are equal, round, and reactive to light.  Neck: Normal range of motion and full passive range of motion without pain. Neck supple.  Cardiovascular: Normal rate, regular rhythm and normal heart sounds.  Exam reveals no gallop and no friction rub.   No murmur heard. Pulmonary/Chest: Effort normal and breath sounds normal. No respiratory distress. She has no wheezes. She has no rhonchi. She has no rales. She exhibits no tenderness and no crepitus.  Abdominal: Soft. Normal appearance and bowel sounds are normal. She exhibits no distension. There is tenderness. There is no rebound and no guarding.    Mild diffuse abdominal tenderness without guarding. Area of pain noted.   Musculoskeletal: Normal range of motion. She exhibits no edema and no tenderness.  Moves all extremities well.   Neurological: She is alert and oriented to person, place, and time. She has normal strength. No cranial nerve deficit.  Skin: Skin is warm, dry and intact. No rash noted. No erythema. No pallor.  Psychiatric: She has a normal mood and affect. Her speech is normal and behavior is normal. Her mood appears not anxious.    ED Course  Procedures (including critical care time)  Medications  0.9 %  sodium chloride infusion (0 mLs Intravenous Stopped 10/22/13 1708)    Followed by  0.9 %  sodium chloride infusion (not administered)  metoCLOPramide (REGLAN) injection 10 mg (10 mg Intravenous Given 10/22/13 1555)  loperamide (IMODIUM) capsule 4 mg (4 mg Oral Given 10/22/13 1555)  diphenhydrAMINE (BENADRYL) injection 25 mg (25 mg Intravenous Given 10/22/13 1555)  sodium chloride 0.9 % bolus 1,000 mL (1,000 mLs Intravenous New Bag/Given 10/22/13 1818)  ondansetron (ZOFRAN) injection 4 mg (4 mg Intramuscular Given 10/22/13 1818)    DIAGNOSTIC STUDIES: Oxygen Saturation is 99% on room air, normal by my interpretation.    COORDINATION OF CARE: 3:41 PM- Will order i-stat and  give IV fluids, Reglan, Imodium and Benadryl. Patient informed of current plan for treatment and evaluation and agrees with plan at this time.   6:04 PM- Headache has improved. She is still having nausea, but she has had no episodes of vomiting or diarrhea in the ED since meds. Will give additional IV fluids and anti-nausea meds, as well as take an additional chem panel.  7:41 PM- Updated patient on repeat blood work which was all normal. She states that headache and abdominal pain are still present but improved. Nausea has resolved. She has not had any episodes of diarrhea or vomiting in the ED. She feels that she is stable for discharge.  Results for orders placed during the hospital encounter of 10/22/13  URINALYSIS, ROUTINE W REFLEX  MICROSCOPIC      Result Value Range   Color, Urine YELLOW  YELLOW   APPearance CLEAR  CLEAR   Specific Gravity, Urine 1.020  1.005 - 1.030   pH 5.5  5.0 - 8.0   Glucose, UA NEGATIVE  NEGATIVE mg/dL   Hgb urine dipstick TRACE (*) NEGATIVE   Bilirubin Urine NEGATIVE  NEGATIVE   Ketones, ur NEGATIVE  NEGATIVE mg/dL   Protein, ur NEGATIVE  NEGATIVE mg/dL   Urobilinogen, UA 0.2  0.0 - 1.0 mg/dL   Nitrite NEGATIVE  NEGATIVE   Leukocytes, UA NEGATIVE  NEGATIVE  BASIC METABOLIC PANEL      Result Value Range   Sodium 140  135 - 145 mEq/L   Potassium 3.7  3.5 - 5.1 mEq/L   Chloride 103  96 - 112 mEq/L   CO2 27  19 - 32 mEq/L   Glucose, Bld 75  70 - 99 mg/dL   BUN 10  6 - 23 mg/dL   Creatinine, Ser 1.61  0.50 - 1.10 mg/dL   Calcium 9.7  8.4 - 09.6 mg/dL   GFR calc non Af Amer 80 (*) >90 mL/min   GFR calc Af Amer >90  >90 mL/min  URINE MICROSCOPIC-ADD ON      Result Value Range   Squamous Epithelial / LPF RARE  RARE   RBC / HPF 0-2  <3 RBC/hpf   Bacteria, UA RARE  RARE  POCT I-STAT, CHEM 8      Result Value Range   Sodium 148 (*) 135 - 145 mEq/L   Potassium 3.9  3.5 - 5.1 mEq/L   Chloride 101  96 - 112 mEq/L   BUN 9  6 - 23 mg/dL   Creatinine, Ser 0.45  (*) 0.50 - 1.10 mg/dL   Glucose, Bld 83  70 - 99 mg/dL   Calcium, Ion 4.09 (*) 1.12 - 1.23 mmol/L   TCO2 24  0 - 100 mmol/L   Hemoglobin 15.0  12.0 - 15.0 g/dL   HCT 81.1  91.4 - 78.2 %   Laboratory interpretation all normal (Bmet done because the istat 8 was abnormal and didn't fit the clinical picture).   EKG Interpretation   None       MDM   1. Nausea and vomiting in adult   2. Headache    Discharge Medication List as of 10/22/2013  7:44 PM    START taking these medications   Details  ondansetron (ZOFRAN) 8 MG tablet Take 1 tablet (8 mg total) by mouth every 8 (eight) hours as needed for nausea or vomiting., Starting 10/22/2013, Until Discontinued, Print        Plan discharge   Devoria Albe, MD, FACEP   I personally performed the services described in this documentation, which was scribed in my presence. The recorded information has been reviewed and considered.  Devoria Albe, MD, Armando Gang    Ward Givens, MD 10/22/13 2218

## 2013-12-04 ENCOUNTER — Ambulatory Visit: Payer: Medicaid Other | Admitting: Advanced Practice Midwife

## 2013-12-08 ENCOUNTER — Ambulatory Visit: Payer: Medicaid Other | Admitting: Women's Health

## 2014-01-03 ENCOUNTER — Encounter (HOSPITAL_COMMUNITY): Payer: Self-pay | Admitting: Emergency Medicine

## 2014-01-03 ENCOUNTER — Emergency Department (HOSPITAL_COMMUNITY)
Admission: EM | Admit: 2014-01-03 | Discharge: 2014-01-03 | Disposition: A | Discharge status: Home / Self Care | Payer: Medicaid Other | Attending: Emergency Medicine | Admitting: Emergency Medicine

## 2014-01-03 DIAGNOSIS — Z8619 Personal history of other infectious and parasitic diseases: Secondary | ICD-10-CM | POA: Insufficient documentation

## 2014-01-03 DIAGNOSIS — Z87440 Personal history of urinary (tract) infections: Secondary | ICD-10-CM | POA: Insufficient documentation

## 2014-01-03 DIAGNOSIS — R05 Cough: Secondary | ICD-10-CM

## 2014-01-03 DIAGNOSIS — Z8669 Personal history of other diseases of the nervous system and sense organs: Secondary | ICD-10-CM | POA: Insufficient documentation

## 2014-01-03 DIAGNOSIS — Z8679 Personal history of other diseases of the circulatory system: Secondary | ICD-10-CM | POA: Insufficient documentation

## 2014-01-03 DIAGNOSIS — R059 Cough, unspecified: Secondary | ICD-10-CM

## 2014-01-03 DIAGNOSIS — J069 Acute upper respiratory infection, unspecified: Secondary | ICD-10-CM

## 2014-01-03 DIAGNOSIS — Z79899 Other long term (current) drug therapy: Secondary | ICD-10-CM | POA: Insufficient documentation

## 2014-01-03 MED ORDER — IBUPROFEN 800 MG PO TABS: 800.0000 mg | ORAL_TABLET | Freq: Three times a day (TID) | ORAL | Status: DC

## 2014-01-03 MED ORDER — AZITHROMYCIN 250 MG PO TABS: ORAL_TABLET | ORAL | Status: DC

## 2014-01-03 MED ORDER — GUAIFENESIN-CODEINE 100-10 MG/5ML PO SYRP: 10.0000 mL | ORAL_SOLUTION | Freq: Three times a day (TID) | ORAL | Status: DC | PRN

## 2014-01-03 NOTE — ED Notes (Signed)
Patient with c/o cough congestion x 4 days. Mildly hoarse.

## 2014-01-03 NOTE — Discharge Instructions (Signed)
Cough, Adult ° A cough is a reflex. It helps you clear your throat and airways. A cough can help heal your body. A cough can last 2 or 3 weeks (acute) or may last more than 8 weeks (chronic). Some common causes of a cough can include an infection, allergy, or a cold. °HOME CARE °· Only take medicine as told by your doctor. °· If given, take your medicines (antibiotics) as told. Finish them even if you start to feel better. °· Use a cold steam vaporizer or humidier in your home. This can help loosen thick spit (secretions). °· Sleep so you are almost sitting up (semi-upright). Use pillows to do this. This helps reduce coughing. °· Rest as needed. °· Stop smoking if you smoke. °GET HELP RIGHT AWAY IF: °· You have yellowish-white fluid (pus) in your thick spit. °· Your cough gets worse. °· Your medicine does not reduce coughing, and you are losing sleep. °· You cough up blood. °· You have trouble breathing. °· Your pain gets worse and medicine does not help. °· You have a fever. °MAKE SURE YOU:  °· Understand these instructions. °· Will watch your condition. °· Will get help right away if you are not doing well or get worse. °Document Released: 07/27/2011 Document Revised: 02/05/2012 Document Reviewed: 07/27/2011 °Exit[employer]® Patient Information ©2014 [employer]. °Upper Respiratory Infection, Adult °An upper respiratory infection (URI) is also known as the common cold. It is often caused by a type of germ (virus). Colds are easily spread (contagious). You can pass it to others by kissing, coughing, sneezing, or drinking out of the same glass. Usually, you get better in 1 or 2 weeks.  °HOME CARE  °· Only take medicine as told by your doctor. °· Use a warm mist humidifier or breathe in steam from a hot shower. °· Drink enough water and fluids to keep your pee (urine) clear or pale yellow. °· Get plenty of rest. °· Return to work when your temperature is back to normal or as told by your doctor. You may use a face mask  and wash your hands to stop your cold from spreading. °GET HELP RIGHT AWAY IF:  °· After the first few days, you feel you are getting worse. °· You have questions about your medicine. °· You have chills, shortness of breath, or brown or red spit (mucus). °· You have yellow or brown snot (nasal discharge) or pain in the face, especially when you bend forward. °· You have a fever, puffy (swollen) neck, pain when you swallow, or white spots in the back of your throat. °· You have a bad headache, ear pain, sinus pain, or chest pain. °· You have a high-pitched whistling sound when you breathe in and out (wheezing). °· You have a lasting cough or cough up blood. °· You have sore muscles or a stiff neck. °MAKE SURE YOU:  °· Understand these instructions. °· Will watch your condition. °· Will get help right away if you are not doing well or get worse. °Document Released: 05/01/2008 Document Revised: 02/05/2012 Document Reviewed: 03/20/2011 °ExitCare® Patient Information ©2014 [employer]. ° °

## 2014-01-05 NOTE — ED Provider Notes (Signed)
CSN: [id number]     Arrival date & time 01/03/14  1191 History   First MD Initiated Contact with Patient 01/03/14 [id number]     Chief Complaint  Patient presents with  . Cough  . Nasal Congestion     (Consider location/radiation/quality/duration/timing/severity/associated sxs/prior Treatment) Patient is a 23 y.o. female presenting with cough and URI. The history is provided by the patient.  Cough Associated symptoms: rhinorrhea and sore throat   Associated symptoms: no chest pain, no chills, no fever, no headaches, no myalgias, no shortness of breath and no wheezing   URI Presenting symptoms: congestion, cough, rhinorrhea and sore throat   Presenting symptoms: no fever   Congestion:    Location:  Chest and nasal   Interferes with sleep: no     Interferes with eating/drinking: no   Cough:    Cough characteristics:  Productive   Sputum characteristics:  Yellow   Severity:  Mild   Onset quality:  Gradual   Duration:  4 days   Timing:  Intermittent   Progression:  Unchanged   Chronicity:  New Severity:  Moderate Onset quality:  Gradual Duration:  4 days Timing:  Intermittent Progression:  Unchanged Chronicity:  New Relieved by:  Nothing Worsened by:  Nothing tried Ineffective treatments:  OTC medications Associated symptoms: sneezing   Associated symptoms: no arthralgias, no headaches, no myalgias, no neck pain, no sinus pain, no swollen glands and no wheezing   Risk factors: sick contacts   Risk factors: no diabetes mellitus     Past Medical History  Diagnosis Date  . Preterm labor without delivery in second trimester June 2012    Positive FFN in June  . UTI (lower urinary tract infection)   . Bacterial vaginosis   . Chlamydia   . Migraine    Past Surgical History  Procedure Laterality Date  . No past surgeries     Family History  Problem Relation Age of Onset  . Diabetes Other     great granmother   History  Substance Use Topics  . Smoking status: Never  Smoker   . Smokeless tobacco: Never Used  . Alcohol Use: No   OB History   Grav Para Term Preterm Abortions TAB SAB Ect Mult Living   2 2 1 1      2      [name]  Constitutional: Negative for fever, chills, activity change and appetite change.  HENT: Positive for congestion, rhinorrhea, sneezing and sore throat. Negative for facial swelling and trouble swallowing.   Eyes: Negative for visual disturbance.  Respiratory: Positive for cough. Negative for chest tightness, shortness of breath, wheezing and stridor.   Cardiovascular: Negative for chest pain.  Gastrointestinal: Negative for nausea and vomiting.  Musculoskeletal: Negative for arthralgias, myalgias, neck pain and neck stiffness.  Skin: Negative.   Neurological: Negative for dizziness, weakness, numbness and headaches.  Hematological: Negative for adenopathy.  Psychiatric/Behavioral: Negative for confusion.  All other systems reviewed and are negative.      Allergies  Review of patient's allergies indicates no known allergies.  Home Medications   Current Outpatient Rx  Name  Route  Sig  Dispense  Refill  . guaiFENesin (ROBITUSSIN) 100 MG/5ML SOLN   Oral   Take 5 mLs by mouth every 4 (four) hours as needed for cough or to loosen phlegm.         Marland Kitchen azithromycin (ZITHROMAX Z-PAK) 250 MG tablet      Take two tablets on day one, then  one tab qd days 2-5   6 tablet   0   . guaiFENesin-codeine (ROBITUSSIN AC) 100-10 MG/5ML syrup   Oral   Take 10 mLs by mouth 3 (three) times daily as needed for cough.   120 mL   0   . ibuprofen (ADVIL,MOTRIN) 800 MG tablet   Oral   Take 1 tablet (800 mg total) by mouth 3 (three) times daily.   15 tablet   0   . levonorgestrel (MIRENA) 20 MCG/24HR IUD   Intrauterine   1 each by Intrauterine route once.          BP 102/59  Pulse 63  Temp(Src) 98.4 F (36.9 C) (Oral)  Resp 18  Ht 5\' 7"  (1.702 m)  Wt 279 lb (126.554 kg)  BMI 43.69 kg/m2  SpO2 98% Physical  Exam  Nursing note and vitals reviewed. Constitutional: She is oriented to person, place, and time. She appears well-developed and well-nourished. No distress.  HENT:  Head: Normocephalic and atraumatic.  Right Ear: Tympanic membrane and ear canal normal.  Left Ear: Tympanic membrane and ear canal normal.  Mouth/Throat: Uvula is midline, oropharynx is clear and moist and mucous membranes are normal. No oropharyngeal exudate.  Eyes: EOM are normal. Pupils are equal, round, and reactive to light.  Neck: Normal range of motion, full passive range of motion without pain and phonation normal. Neck supple.  Cardiovascular: Normal rate, regular rhythm, normal heart sounds and intact distal pulses.   No murmur heard. Pulmonary/Chest: Effort normal. No stridor. No respiratory distress. She has no wheezes. She has no rales. She exhibits no tenderness.  Coarse lungs sounds bilaterally.  No rales   Musculoskeletal: She exhibits no edema.  Lymphadenopathy:    She has no cervical adenopathy.  Neurological: She is alert and oriented to person, place, and time. She exhibits normal muscle tone. Coordination normal.  Skin: Skin is warm and dry.    ED Course  Procedures (including critical care time) Labs Review Labs Reviewed - No data to display Imaging Review No results found.  EKG Interpretation   None       MDM   Final diagnoses:  Cough  URI (upper respiratory infection)    Patient is well appearing.  VSS.  PERC negative.  Has nasal congestion and productive cough for several days.  She agrees to fluids, ibuprofen for fever, robitussin AC and zithromax.  Advised to f/u with PMD.  She appears stable for d/c.      Megha Agnes L. Trisha Mangleriplett, PA-C 01/05/14 2054

## 2014-01-06 NOTE — ED Provider Notes (Signed)
Medical screening examination/treatment/procedure(s) were performed by non-physician practitioner and as supervising physician I was immediately available for consultation/collaboration.  EKG Interpretation   None         Laray AngerKathleen M Ruhaan Nordahl, DO 01/06/14 1130

## 2014-02-06 ENCOUNTER — Encounter (HOSPITAL_COMMUNITY): Payer: Self-pay | Admitting: Emergency Medicine

## 2014-02-06 ENCOUNTER — Emergency Department (HOSPITAL_COMMUNITY)
Admission: EM | Admit: 2014-02-06 | Discharge: 2014-02-06 | Disposition: A | Discharge status: Home / Self Care | Payer: Medicaid Other | Attending: Emergency Medicine | Admitting: Emergency Medicine

## 2014-02-06 DIAGNOSIS — Z8679 Personal history of other diseases of the circulatory system: Secondary | ICD-10-CM | POA: Insufficient documentation

## 2014-02-06 DIAGNOSIS — Z8619 Personal history of other infectious and parasitic diseases: Secondary | ICD-10-CM | POA: Insufficient documentation

## 2014-02-06 DIAGNOSIS — N644 Mastodynia: Secondary | ICD-10-CM | POA: Insufficient documentation

## 2014-02-06 DIAGNOSIS — Z8742 Personal history of other diseases of the female genital tract: Secondary | ICD-10-CM | POA: Insufficient documentation

## 2014-02-06 DIAGNOSIS — Z87440 Personal history of urinary (tract) infections: Secondary | ICD-10-CM | POA: Insufficient documentation

## 2014-02-06 DIAGNOSIS — R112 Nausea with vomiting, unspecified: Secondary | ICD-10-CM | POA: Insufficient documentation

## 2014-02-06 DIAGNOSIS — Z792 Long term (current) use of antibiotics: Secondary | ICD-10-CM | POA: Insufficient documentation

## 2014-02-06 DIAGNOSIS — Z791 Long term (current) use of non-steroidal anti-inflammatories (NSAID): Secondary | ICD-10-CM | POA: Insufficient documentation

## 2014-02-06 DIAGNOSIS — Z3202 Encounter for pregnancy test, result negative: Secondary | ICD-10-CM | POA: Insufficient documentation

## 2014-02-06 DIAGNOSIS — R1110 Vomiting, unspecified: Secondary | ICD-10-CM

## 2014-02-06 DIAGNOSIS — R350 Frequency of micturition: Secondary | ICD-10-CM | POA: Insufficient documentation

## 2014-02-06 DIAGNOSIS — Z79899 Other long term (current) drug therapy: Secondary | ICD-10-CM | POA: Insufficient documentation

## 2014-02-06 LAB — URINALYSIS, ROUTINE W REFLEX MICROSCOPIC
Bilirubin Urine: NEGATIVE
Glucose, UA: NEGATIVE mg/dL
HGB URINE DIPSTICK: NEGATIVE
Ketones, ur: NEGATIVE mg/dL
Leukocytes, UA: NEGATIVE
NITRITE: NEGATIVE
PROTEIN: NEGATIVE mg/dL
SPECIFIC GRAVITY, URINE: 1.015 (ref 1.005–1.030)
UROBILINOGEN UA: 0.2 mg/dL (ref 0.0–1.0)
pH: 6.5 (ref 5.0–8.0)

## 2014-02-06 LAB — CBC WITH DIFFERENTIAL/PLATELET
BASOS ABS: 0 10*3/uL (ref 0.0–0.1)
BASOS PCT: 1 % (ref 0–1)
EOS ABS: 0.1 10*3/uL (ref 0.0–0.7)
Eosinophils Relative: 2 % (ref 0–5)
HCT: 37.8 % (ref 36.0–46.0)
Hemoglobin: 12.3 g/dL (ref 12.0–15.0)
Lymphocytes Relative: 42 % (ref 12–46)
Lymphs Abs: 2.5 10*3/uL (ref 0.7–4.0)
MCH: 26.9 pg (ref 26.0–34.0)
MCHC: 32.5 g/dL (ref 30.0–36.0)
MCV: 82.5 fL (ref 78.0–100.0)
MONO ABS: 0.4 10*3/uL (ref 0.1–1.0)
MONOS PCT: 7 % (ref 3–12)
NEUTROS ABS: 2.8 10*3/uL (ref 1.7–7.7)
Neutrophils Relative %: 48 % (ref 43–77)
Platelets: 233 10*3/uL (ref 150–400)
RBC: 4.58 MIL/uL (ref 3.87–5.11)
RDW: 13.6 % (ref 11.5–15.5)
WBC: 5.9 10*3/uL (ref 4.0–10.5)

## 2014-02-06 LAB — BASIC METABOLIC PANEL
BUN: 11 mg/dL (ref 6–23)
CO2: 24 mEq/L (ref 19–32)
CREATININE: 0.94 mg/dL (ref 0.50–1.10)
Calcium: 9.2 mg/dL (ref 8.4–10.5)
Chloride: 104 mEq/L (ref 96–112)
GFR calc Af Amer: >90 mL/min (ref >90)
GFR, EST NON AFRICAN AMERICAN: 86 mL/min — AB (ref >90)
GLUCOSE: 114 mg/dL — AB (ref 70–99)
Potassium: 3.8 mEq/L (ref 3.7–5.3)
Sodium: 140 mEq/L (ref 137–147)

## 2014-02-06 LAB — POC URINE PREG, ED: Preg Test, Ur: NEGATIVE

## 2014-02-06 MED ORDER — ONDANSETRON HCL 4 MG PO TABS: 4.0000 mg | ORAL_TABLET | Freq: Four times a day (QID) | ORAL | Status: DC

## 2014-02-06 MED ORDER — ONDANSETRON 4 MG PO TBDP
4.0000 mg | ORAL_TABLET | Freq: Once | ORAL | Status: AC
  Administered 2014-02-06: 4 mg via ORAL
  Filled 2014-02-06: qty 1

## 2014-02-06 NOTE — ED Notes (Signed)
Voiding frequently, vomiting , no diarrhea.  No fever.  Alert,  Breasts are tender.

## 2014-02-06 NOTE — Discharge Instructions (Signed)

## 2014-02-06 NOTE — ED Provider Notes (Signed)
CSN: [id number]     Arrival date & time 02/06/14  1828 History   First MD Initiated Contact with Patient 02/06/14 1920     Chief Complaint  Patient presents with  . Emesis     (Consider location/radiation/quality/duration/timing/severity/associated sxs/prior Treatment) HPI Comments: Patient developed vomiting last night that improved with this morning. She had about 6 episodes of nonbloody nonbilious episodes of vomiting yesterday. This morning she felt well. She accepted to go to work tonight had another episode of vomiting. She also endorses voiding frequently this is painless. No diarrhea. No fever. No pain with urination. No Vaginal bleeding or discharge. Last menstrual January 31. She endorses some breast soreness today. No sick contacts. No recent travel.  The history is provided by the patient.    Past Medical History  Diagnosis Date  . Preterm labor without delivery in second trimester June 2012    Positive FFN in June  . UTI (lower urinary tract infection)   . Bacterial vaginosis   . Chlamydia   . Migraine    Past Surgical History  Procedure Laterality Date  . No past surgeries     Family History  Problem Relation Age of Onset  . Diabetes Other     great granmother   History  Substance Use Topics  . Smoking status: Never Smoker   . Smokeless tobacco: Never Used  . Alcohol Use: No   OB History   Grav Para Term Preterm Abortions TAB SAB Ect Mult Living   2 2 1 1      2      [name]  Constitutional: Negative for fever, activity change and appetite change.  HENT: Negative for congestion and rhinorrhea.   Respiratory: Negative for cough, chest tightness and shortness of breath.   Cardiovascular: Negative for chest pain.  Gastrointestinal: Positive for nausea and vomiting. Negative for abdominal pain, diarrhea and constipation.  Genitourinary: Positive for frequency. Negative for dysuria, hematuria and vaginal bleeding.  Musculoskeletal: Negative for  back pain, neck pain and neck stiffness.  Skin: Negative for rash.  Neurological: Negative.  Negative for dizziness, weakness and headaches.  A complete 10 system review of systems was obtained and all systems are negative except as noted in the HPI and PMH.      Allergies  Review of patient's allergies indicates no known allergies.  Home Medications   Current Outpatient Rx  Name  Route  Sig  Dispense  Refill  . azithromycin (ZITHROMAX Z-PAK) 250 MG tablet      Take two tablets on day one, then one tab qd days 2-5   6 tablet   0   . guaiFENesin (ROBITUSSIN) 100 MG/5ML SOLN   Oral   Take 5 mLs by mouth every 4 (four) hours as needed for cough or to loosen phlegm.         Marland Kitchen guaiFENesin-codeine (ROBITUSSIN AC) 100-10 MG/5ML syrup   Oral   Take 10 mLs by mouth 3 (three) times daily as needed for cough.   120 mL   0   . ibuprofen (ADVIL,MOTRIN) 800 MG tablet   Oral   Take 1 tablet (800 mg total) by mouth 3 (three) times daily.   15 tablet   0   . levonorgestrel (MIRENA) 20 MCG/24HR IUD   Intrauterine   1 each by Intrauterine route once.         . ondansetron (ZOFRAN) 4 MG tablet   Oral   Take 1 tablet (4 mg total) by mouth  every 6 (six) hours.   12 tablet   0    BP 126/74  Pulse 92  Temp(Src) 98.6 F (37 C) (Oral)  Resp 20  Ht 5\' 7"  (1.702 m)  Wt 324 lb (146.965 kg)  BMI 50.73 kg/m2  SpO2 99%  LMP 12/27/2013 Physical Exam  Constitutional: She is oriented to person, place, and time. She appears well-developed. No distress.  Talking on phone   HENT:  Head: Normocephalic and atraumatic.  Mouth/Throat: Oropharynx is clear and moist. No oropharyngeal exudate.  Eyes: Conjunctivae and EOM are normal. Pupils are equal, round, and reactive to light.  Neck: Normal range of motion. Neck supple.  Cardiovascular: Normal rate, regular rhythm and normal heart sounds.   Pulmonary/Chest: Effort normal and breath sounds normal. No respiratory distress.  Abdominal:  Soft. There is no tenderness. There is no rebound and no guarding.  Musculoskeletal: Normal range of motion. She exhibits no edema and no tenderness.  Neurological: She is alert and oriented to person, place, and time. No cranial nerve deficit. She exhibits normal muscle tone. Coordination normal.  Skin: Skin is warm.    ED Course  Procedures (including critical care time) Labs Review Labs Reviewed  BASIC METABOLIC PANEL - Abnormal; Notable for the following:    Glucose, Bld 114 (*)    GFR calc non Af Amer 86 (*)    All other components within normal limits  CBC WITH DIFFERENTIAL  URINALYSIS, ROUTINE W REFLEX MICROSCOPIC  POC URINE PREG, ED   Imaging Review No results found.   EKG Interpretation None      MDM   Final diagnoses:  Vomiting   Vomiting with urinary frequency. No abdominal pain. Labs ordered in triage are unremarkable.   prengancy test is negative. UA negative. Abdomen soft and nontender. Patient tolerating by mouth in the ED with no vomiting. She is speaking on the phone when I enter the room.  Glynn OctaveStephen Nialah Saravia, MD 02/06/14 2000

## 2014-04-14 ENCOUNTER — Other Ambulatory Visit (HOSPITAL_COMMUNITY)
Admission: RE | Admit: 2014-04-14 | Discharge: 2014-04-14 | Disposition: A | Discharge status: Home / Self Care | Payer: Medicaid Other | Source: Ambulatory Visit | Attending: Advanced Practice Midwife | Admitting: Advanced Practice Midwife

## 2014-04-14 ENCOUNTER — Ambulatory Visit (INDEPENDENT_AMBULATORY_CARE_PROVIDER_SITE_OTHER): Payer: Medicaid Other | Admitting: Advanced Practice Midwife

## 2014-04-14 ENCOUNTER — Encounter: Payer: Self-pay | Admitting: Advanced Practice Midwife

## 2014-04-14 VITALS — BP 122/70 | Ht 67.25 in | Wt 322.0 lb

## 2014-04-14 DIAGNOSIS — Z0000 Encounter for general adult medical examination without abnormal findings: Secondary | ICD-10-CM

## 2014-04-14 DIAGNOSIS — Z113 Encounter for screening for infections with a predominantly sexual mode of transmission: Secondary | ICD-10-CM | POA: Insufficient documentation

## 2014-04-14 DIAGNOSIS — Z124 Encounter for screening for malignant neoplasm of cervix: Secondary | ICD-10-CM | POA: Insufficient documentation

## 2014-04-14 DIAGNOSIS — Z01419 Encounter for gynecological examination (general) (routine) without abnormal findings: Secondary | ICD-10-CM

## 2014-04-14 NOTE — Patient Instructions (Signed)
LOW CARB MEALS:  [email or web address]

## 2014-04-14 NOTE — Progress Notes (Signed)
Patricia [name] 23 y.o.  Filed Vitals:   04/14/14 1415  BP: 122/70     Past Medical History: Past Medical History  Diagnosis Date  . Preterm labor without delivery in second trimester June 2012    Positive FFN in June  . UTI (lower urinary tract infection)   . Bacterial vaginosis   . Chlamydia   . Migraine     Past Surgical History: Past Surgical History  Procedure Laterality Date  . No past surgeries      Family History: Family History  Problem Relation Age of Onset  . Diabetes Other     great granmother  . Diabetes Paternal Grandfather   . Hypertension Paternal Grandfather   . Cancer Paternal Grandfather     prostate    Social History: History  Substance Use Topics  . Smoking status: Never Smoker   . Smokeless tobacco: Never Used  . Alcohol Use: No    Allergies: No Known Allergies   History of Present Illness:  Current outpatient prescriptions:levonorgestrel (MIRENA) 20 MCG/24HR IUD, 1 each by Intrauterine route once., Disp: , Rfl: ;  azithromycin (ZITHROMAX Z-PAK) 250 MG tablet, Take two tablets on day one, then one tab qd days 2-5, Disp: 6 tablet, Rfl: 0;  guaiFENesin (ROBITUSSIN) 100 MG/5ML SOLN, Take 5 mLs by mouth every 4 (four) hours as needed for cough or to loosen phlegm., Disp: , Rfl:  guaiFENesin-codeine (ROBITUSSIN AC) 100-10 MG/5ML syrup, Take 10 mLs by mouth 3 (three) times daily as needed for cough., Disp: 120 mL, Rfl: 0;  ibuprofen (ADVIL,MOTRIN) 800 MG tablet, Take 1 tablet (800 mg total) by mouth 3 (three) times daily., Disp: 15 tablet, Rfl: 0;  ondansetron (ZOFRAN) 4 MG tablet, Take 1 tablet (4 mg total) by mouth every 6 (six) hours., Disp: 12 tablet, Rfl: 0    [name]   Patient denies any headaches, blurred vision, shortness of breath, chest pain, abdominal pain, problems with bowel movements, urination. For the past few months, she and her boyfriend have been able to feel IUD during intercourse.  Has intermittent sharp pains  throughout the day.    Physical Exam: General:  Well developed, well nourished, no acute distress Skin:  Warm and dry Neck:  Midline trachea, normal thyroid Lungs; Clear to auscultation bilaterally Breast:  No dominant palpable mass, retraction, or nipple discharge Cardiovascular: Regular rate and rhythm Abdomen:  Soft, non tender, no hepatosplenomegaly Pelvic:  External genitalia is normal in appearance.  The vagina is normal in appearance.  The cervix is bulbous.  Uterus is felt to be normal size, shape, and contour.  No adnexal masses or tenderness noted, but difficult to assess d/t habitus.  Tip of IUD palpable at cx os.  Extremities:  No swelling or varicosities noted Psych:  No mood changes   Impression:  WELL WOMAN VISIT Malpositioned IUD   Plan: Nuva ring today.  Plan IUD removal at pt's convenience.  Low Carb diet website given

## 2014-04-15 ENCOUNTER — Ambulatory Visit (INDEPENDENT_AMBULATORY_CARE_PROVIDER_SITE_OTHER): Payer: Medicaid Other | Admitting: Advanced Practice Midwife

## 2014-04-15 ENCOUNTER — Encounter: Payer: Self-pay | Admitting: Advanced Practice Midwife

## 2014-04-15 DIAGNOSIS — Z309 Encounter for contraceptive management, unspecified: Secondary | ICD-10-CM

## 2014-04-15 DIAGNOSIS — Z30432 Encounter for removal of intrauterine contraceptive device: Secondary | ICD-10-CM

## 2014-04-15 NOTE — Progress Notes (Signed)
  Ricky Alaymesia D Cieslak 23 y.o.  There were no vitals filed for this visit. Past Medical History  Diagnosis Date  . Preterm labor without delivery in second trimester June 2012    Positive FFN in June  . UTI (lower urinary tract infection)   . Bacterial vaginosis   . Chlamydia   . Migraine    Past Surgical History  Procedure Laterality Date  . No past surgeries     family history includes Cancer in her paternal grandfather; Diabetes in her other and paternal grandfather; Hypertension in her paternal grandfather. Current outpatient prescriptions:ibuprofen (ADVIL,MOTRIN) 800 MG tablet, Take 1 tablet (800 mg total) by mouth 3 (three) times daily., Disp: 15 tablet, Rfl: 0    Here for IUD removal.  She had the Mirena IUD placed in July and would like it removed because it has slipped down into her cervix and is giving her pain.. Her plans for future contraception are Nuva Ring, started yesterday.  A graves speculum was placed, and the strings were visible.  They were grasped with a curved Tresa EndoKelly and the IUD easily removed.  Pt given IUD removal f/u instructions.

## 2014-04-21 ENCOUNTER — Encounter: Payer: Self-pay | Admitting: Advanced Practice Midwife

## 2014-06-09 ENCOUNTER — Emergency Department (HOSPITAL_COMMUNITY)
Admission: EM | Admit: 2014-06-09 | Discharge: 2014-06-09 | Disposition: A | Discharge status: Home / Self Care | Payer: Medicaid Other | Attending: Emergency Medicine | Admitting: Emergency Medicine

## 2014-06-09 ENCOUNTER — Encounter (HOSPITAL_COMMUNITY): Payer: Self-pay | Admitting: Emergency Medicine

## 2014-06-09 DIAGNOSIS — R109 Unspecified abdominal pain: Secondary | ICD-10-CM | POA: Diagnosis present

## 2014-06-09 DIAGNOSIS — Z8742 Personal history of other diseases of the female genital tract: Secondary | ICD-10-CM | POA: Insufficient documentation

## 2014-06-09 DIAGNOSIS — R51 Headache: Secondary | ICD-10-CM | POA: Insufficient documentation

## 2014-06-09 DIAGNOSIS — K5289 Other specified noninfective gastroenteritis and colitis: Secondary | ICD-10-CM | POA: Insufficient documentation

## 2014-06-09 DIAGNOSIS — Z87440 Personal history of urinary (tract) infections: Secondary | ICD-10-CM | POA: Insufficient documentation

## 2014-06-09 DIAGNOSIS — R197 Diarrhea, unspecified: Secondary | ICD-10-CM | POA: Diagnosis not present

## 2014-06-09 DIAGNOSIS — K529 Noninfective gastroenteritis and colitis, unspecified: Secondary | ICD-10-CM

## 2014-06-09 DIAGNOSIS — Z8619 Personal history of other infectious and parasitic diseases: Secondary | ICD-10-CM | POA: Insufficient documentation

## 2014-06-09 DIAGNOSIS — Z8679 Personal history of other diseases of the circulatory system: Secondary | ICD-10-CM | POA: Insufficient documentation

## 2014-06-09 DIAGNOSIS — Z791 Long term (current) use of non-steroidal anti-inflammatories (NSAID): Secondary | ICD-10-CM | POA: Insufficient documentation

## 2014-06-09 DIAGNOSIS — Z3202 Encounter for pregnancy test, result negative: Secondary | ICD-10-CM | POA: Insufficient documentation

## 2014-06-09 LAB — CBC WITH DIFFERENTIAL/PLATELET
Basophils Absolute: 0 10*3/uL (ref 0.0–0.1)
Basophils Relative: 0 % (ref 0–1)
EOS PCT: 1 % (ref 0–5)
Eosinophils Absolute: 0.1 10*3/uL (ref 0.0–0.7)
HEMATOCRIT: 34.3 % — AB (ref 36.0–46.0)
Hemoglobin: 11.5 g/dL — ABNORMAL LOW (ref 12.0–15.0)
LYMPHS ABS: 1.9 10*3/uL (ref 0.7–4.0)
LYMPHS PCT: 41 % (ref 12–46)
MCH: 27.3 pg (ref 26.0–34.0)
MCHC: 33.5 g/dL (ref 30.0–36.0)
MCV: 81.5 fL (ref 78.0–100.0)
Monocytes Absolute: 0.3 10*3/uL (ref 0.1–1.0)
Monocytes Relative: 7 % (ref 3–12)
NEUTROS ABS: 2.3 10*3/uL (ref 1.7–7.7)
Neutrophils Relative %: 51 % (ref 43–77)
PLATELETS: 203 10*3/uL (ref 150–400)
RBC: 4.21 MIL/uL (ref 3.87–5.11)
RDW: 13.2 % (ref 11.5–15.5)
WBC: 4.6 10*3/uL (ref 4.0–10.5)

## 2014-06-09 LAB — URINALYSIS, ROUTINE W REFLEX MICROSCOPIC
GLUCOSE, UA: NEGATIVE mg/dL
Hgb urine dipstick: NEGATIVE
Ketones, ur: 15 mg/dL — AB
LEUKOCYTES UA: NEGATIVE
Nitrite: NEGATIVE
Protein, ur: 30 mg/dL — AB
UROBILINOGEN UA: 1 mg/dL (ref 0.0–1.0)
pH: 6 (ref 5.0–8.0)

## 2014-06-09 LAB — URINE MICROSCOPIC-ADD ON

## 2014-06-09 LAB — COMPREHENSIVE METABOLIC PANEL
ALT: 21 U/L (ref 0–35)
AST: 20 U/L (ref 0–37)
Albumin: 3.7 g/dL (ref 3.5–5.2)
Alkaline Phosphatase: 52 U/L (ref 39–117)
Anion gap: 12 (ref 5–15)
BUN: 10 mg/dL (ref 6–23)
CALCIUM: 9.3 mg/dL (ref 8.4–10.5)
CO2: 24 mEq/L (ref 19–32)
Chloride: 106 mEq/L (ref 96–112)
Creatinine, Ser: 1.16 mg/dL — ABNORMAL HIGH (ref 0.50–1.10)
GFR calc non Af Amer: 67 mL/min — ABNORMAL LOW (ref >90)
GFR, EST AFRICAN AMERICAN: 77 mL/min — AB (ref >90)
GLUCOSE: 90 mg/dL (ref 70–99)
Potassium: 3.7 mEq/L (ref 3.7–5.3)
Sodium: 142 mEq/L (ref 137–147)
Total Bilirubin: 0.4 mg/dL (ref 0.3–1.2)
Total Protein: 7.2 g/dL (ref 6.0–8.3)

## 2014-06-09 LAB — PREGNANCY, URINE: Preg Test, Ur: NEGATIVE

## 2014-06-09 MED ORDER — DICYCLOMINE HCL 20 MG PO TABS: ORAL_TABLET | ORAL | Status: DC

## 2014-06-09 MED ORDER — PROMETHAZINE HCL 25 MG PO TABS: 25.0000 mg | ORAL_TABLET | Freq: Four times a day (QID) | ORAL | Status: DC | PRN

## 2014-06-09 MED ORDER — ONDANSETRON HCL 4 MG/2ML IJ SOLN
4.0000 mg | Freq: Once | INTRAMUSCULAR | Status: AC
  Administered 2014-06-09: 4 mg via INTRAVENOUS
  Filled 2014-06-09: qty 2

## 2014-06-09 MED ORDER — SODIUM CHLORIDE 0.9 % IV BOLUS (SEPSIS)
1000.0000 mL | Freq: Once | INTRAVENOUS | Status: AC
  Administered 2014-06-09: 1000 mL via INTRAVENOUS

## 2014-06-09 MED ORDER — KETOROLAC TROMETHAMINE 30 MG/ML IJ SOLN
30.0000 mg | Freq: Once | INTRAMUSCULAR | Status: AC
  Administered 2014-06-09: 30 mg via INTRAVENOUS
  Filled 2014-06-09: qty 1

## 2014-06-09 NOTE — Discharge Instructions (Signed)
Tylenol for fever and aches.  Follow up in 2-3 days if not improving

## 2014-06-09 NOTE — ED Provider Notes (Signed)
CSN: [id number]     Arrival date & time 06/09/14  [id number] History  This chart was scribed for Benny LennertJoseph L Ronny Ruddell, MD,  by Ashley JacobsBrittany Andrews, ED Scribe. The patient was seen in room APA11/APA11 and the patient's care was started at 9:31 AM.    First MD Initiated Contact with Patient 06/09/14 [phone]     No chief complaint on file.    (Consider location/radiation/quality/duration/timing/severity/associated sxs/prior Treatment) Patient is a 23 y.o. female presenting with abdominal pain and headaches. The history is provided by the patient and medical records. No language interpreter was used.  Abdominal Pain Pain location:  LLQ Pain radiates to:  Does not radiate Pain severity:  Moderate Onset quality:  Sudden Timing:  Constant Progression:  Unchanged Chronicity:  New Relieved by:  Nothing Ineffective treatments:  None tried Associated symptoms: diarrhea, nausea and vomiting   Diarrhea:    Quality:  Watery   Number of occurrences:  3-4   Severity:  Moderate   Timing:  Constant   Progression:  Partially resolved Nausea:    Severity:  Mild   Onset quality:  Sudden   Timing:  Constant   Progression:  Unchanged Headache Pain location:  L temporal and R temporal Radiates to:  Does not radiate Onset quality:  Gradual Duration:  1 day Timing:  Constant Progression:  Worsening Relieved by:  Nothing Ineffective treatments:  None tried Associated symptoms: abdominal pain, diarrhea, nausea and vomiting    HPI Comments: Patricia [name] is a 23 y.o. female who presents to the Emergency Department complaining of constant, moderate lower abdominal pain, onset today. Pt complains of a headache that started yesterday. Pt mentions that her headache was much worse when waking up six hours ago. Pt was vomiting prior to leaving home to go to work. Pt mention upon arrival to her job she increased head pain, chest pain, abdominal cramping, and 3-4 episodes of watery diarrhea. Pt did not eat anything  today. Nothing seems to help.   Past Medical History  Diagnosis Date  . Preterm labor without delivery in second trimester June 2012    Positive FFN in June  . UTI (lower urinary tract infection)   . Bacterial vaginosis   . Chlamydia   . Migraine    Past Surgical History  Procedure Laterality Date  . No past surgeries     Family History  Problem Relation Age of Onset  . Diabetes Other     great granmother  . Diabetes Paternal Grandfather   . Hypertension Paternal Grandfather   . Cancer Paternal Grandfather     prostate   History  Substance Use Topics  . Smoking status: Never Smoker   . Smokeless tobacco: Never Used  . Alcohol Use: No   OB History   Grav Para Term Preterm Abortions TAB SAB Ect Mult Living   2 2 1 1      2      [name]  Gastrointestinal: Positive for nausea, vomiting, abdominal pain and diarrhea.  Neurological: Positive for headaches.  All other systems reviewed and are negative.     Allergies  Review of patient's allergies indicates no known allergies.  Home Medications   Prior to Admission medications   Medication Sig Start Date End Date Taking? Authorizing Provider  ibuprofen (ADVIL,MOTRIN) 800 MG tablet Take 1 tablet (800 mg total) by mouth 3 (three) times daily. 01/03/14   Tammy L. Triplett, PA-C   BP 128/74  Pulse 66  Temp(Src) 98.3 F (36.8 C) (  Oral)  Resp 20  Ht 5\' 7"  (1.702 m)  Wt 276 lb (125.193 kg)  BMI 43.22 kg/m2  SpO2 100% Physical Exam  Constitutional: She is oriented to person, place, and time. She appears well-developed.  HENT:  Head: Normocephalic.  Eyes: Conjunctivae and EOM are normal. No scleral icterus.  Neck: Neck supple.  Cardiovascular: Normal rate.   Abdominal: There is no tenderness. There is no rebound and no guarding.  Musculoskeletal: Normal range of motion. She exhibits no edema.  Lymphadenopathy:    She has no cervical adenopathy.  Neurological: She is oriented to person, place, and time. She  exhibits normal muscle tone. Coordination normal.  Skin: No rash noted. No erythema.  Psychiatric: She has a normal mood and affect. Her behavior is normal.    ED Course  Procedures (including critical care time) DIAGNOSTIC STUDIES: Oxygen Saturation is 100% on room air, normal by my interpretation.    COORDINATION OF CARE:  9:37 AM Discussed course of care with pt which includes Zofran, Toradol, IV and laboratory tests. Pt understands and agrees.   Labs Review Labs Reviewed - No data to display  Imaging Review No results found.   EKG Interpretation None      MDM   Final diagnoses:  None    Gastroenteritis,   tx with bentyl,  Phenergan and follow up as needed.  Plenty of fluids   The chart was scribed for me under my direct supervision.  I personally performed the history, physical, and medical decision making and all procedures in the evaluation of this patient.[name], MD 06/09/14 1257

## 2014-06-09 NOTE — ED Notes (Signed)
Pt  C/o headache, n/v X1, several episodes of diarrhea, chest pain, lower abd pain that started during the night.

## 2014-06-09 NOTE — ED Notes (Signed)
Migraine started yesterday.  Started with diarrhea and vomiting this am.  Abdomen cramping this am.  Rating abdomen pain 7-8.  And rates headache 8 at this time.

## 2014-06-15 ENCOUNTER — Emergency Department (HOSPITAL_COMMUNITY): Payer: Medicaid Other

## 2014-06-15 ENCOUNTER — Emergency Department (HOSPITAL_COMMUNITY)
Admission: EM | Admit: 2014-06-15 | Discharge: 2014-06-15 | Disposition: A | Discharge status: Home / Self Care | Payer: Medicaid Other | Attending: Emergency Medicine | Admitting: Emergency Medicine

## 2014-06-15 ENCOUNTER — Encounter (HOSPITAL_COMMUNITY): Payer: Self-pay | Admitting: Emergency Medicine

## 2014-06-15 DIAGNOSIS — G43909 Migraine, unspecified, not intractable, without status migrainosus: Secondary | ICD-10-CM | POA: Diagnosis not present

## 2014-06-15 DIAGNOSIS — R059 Cough, unspecified: Secondary | ICD-10-CM | POA: Diagnosis present

## 2014-06-15 DIAGNOSIS — R071 Chest pain on breathing: Secondary | ICD-10-CM | POA: Insufficient documentation

## 2014-06-15 DIAGNOSIS — Z791 Long term (current) use of non-steroidal anti-inflammatories (NSAID): Secondary | ICD-10-CM | POA: Diagnosis not present

## 2014-06-15 DIAGNOSIS — R0789 Other chest pain: Secondary | ICD-10-CM

## 2014-06-15 DIAGNOSIS — Z87440 Personal history of urinary (tract) infections: Secondary | ICD-10-CM | POA: Insufficient documentation

## 2014-06-15 DIAGNOSIS — R52 Pain, unspecified: Secondary | ICD-10-CM | POA: Diagnosis not present

## 2014-06-15 DIAGNOSIS — Z79899 Other long term (current) drug therapy: Secondary | ICD-10-CM | POA: Diagnosis not present

## 2014-06-15 DIAGNOSIS — Z8742 Personal history of other diseases of the female genital tract: Secondary | ICD-10-CM | POA: Diagnosis not present

## 2014-06-15 DIAGNOSIS — Z8619 Personal history of other infectious and parasitic diseases: Secondary | ICD-10-CM | POA: Diagnosis not present

## 2014-06-15 DIAGNOSIS — R05 Cough: Secondary | ICD-10-CM | POA: Diagnosis present

## 2014-06-15 MED ORDER — NAPROXEN 250 MG PO TABS
500.0000 mg | ORAL_TABLET | Freq: Once | ORAL | Status: AC
  Administered 2014-06-15: 500 mg via ORAL
  Filled 2014-06-15: qty 2

## 2014-06-15 MED ORDER — NAPROXEN 500 MG PO TABS: 500.0000 mg | ORAL_TABLET | Freq: Two times a day (BID) | ORAL | Status: DC

## 2014-06-15 NOTE — ED Provider Notes (Signed)
CSN: [id number]     Arrival date & time 06/15/14  0307 History   First MD Initiated Contact with Patient 06/15/14 0315     Chief Complaint  Patient presents with  . Cough     (Consider location/radiation/quality/duration/timing/severity/associated sxs/prior Treatment) HPI Comments: The patient is a 23 year old female with a history of gastroenteritis approximately one week ago which resolved with medications who presents with acute onset of left-sided chest pain, sharp and stabbing, persistent, worse with palpation of the chest and worse with deep breathing. This awoke her from sleep approximately 30 minutes ago. She has no associated swelling in her legs, no injury to her chest or her legs, no recent surgery, denies oral contraceptives, denies tobacco use. The patient also denies fevers chills nausea vomiting or coughing or shortness of breath  Patient is a 23 y.o. female presenting with cough. The history is provided by the patient.  Cough   Past Medical History  Diagnosis Date  . Preterm labor without delivery in second trimester June 2012    Positive FFN in June  . UTI (lower urinary tract infection)   . Bacterial vaginosis   . Chlamydia   . Migraine    Past Surgical History  Procedure Laterality Date  . No past surgeries     Family History  Problem Relation Age of Onset  . Diabetes Other     great granmother  . Diabetes Paternal Grandfather   . Hypertension Paternal Grandfather   . Cancer Paternal Grandfather     prostate   History  Substance Use Topics  . Smoking status: Never Smoker   . Smokeless tobacco: Never Used  . Alcohol Use: No   OB History   Grav Para Term Preterm Abortions TAB SAB Ect Mult Living   2 2 1 1      2      [name]  Respiratory: Positive for cough.   All other systems reviewed and are negative.     Allergies  Review of patient's allergies indicates no known allergies.  Home Medications   Prior to Admission medications    Medication Sig Start Date End Date Taking? Authorizing Provider  dicyclomine (BENTYL) 20 MG tablet Take one every 6 hours for abd cramps 06/09/14  Yes [name], MD  promethazine (PHENERGAN) 25 MG tablet Take 1 tablet (25 mg total) by mouth every 6 (six) hours as needed for nausea or vomiting. 06/09/14  Yes [name], MD  Aspirin-Acetaminophen-Caffeine (EXCEDRIN EXTRA STRENGTH PO) Take 1 tablet by mouth as needed (migraine).    Historical Provider, MD  naproxen (NAPROSYN) 500 MG tablet Take 1 tablet (500 mg total) by mouth 2 (two) times daily with a meal. 06/15/14   Vida Roller, MD  promethazine (PHENERGAN) 25 MG tablet Take 1 tablet (25 mg total) by mouth every 6 (six) hours as needed for nausea. 02/15/12 02/22/12  [name], MD   BP 116/69  Pulse 81  Temp(Src) 98.1 F (36.7 C) (Oral)  Resp 20  SpO2 100%  LMP 04/28/2014 Physical Exam  Nursing note and vitals reviewed. Constitutional: She appears well-developed and well-nourished. No distress.  HENT:  Head: Normocephalic and atraumatic.  Mouth/Throat: Oropharynx is clear and moist. No oropharyngeal exudate.  Eyes: Conjunctivae and EOM are normal. Pupils are equal, round, and reactive to light. Right eye exhibits no discharge. Left eye exhibits no discharge. No scleral icterus.  Neck: Normal range of motion. Neck supple. No JVD present. No thyromegaly present.  Cardiovascular: Normal  rate, regular rhythm, normal heart sounds and intact distal pulses.  Exam reveals no gallop and no friction rub.   No murmur heard. Pulmonary/Chest: Effort normal and breath sounds normal. No respiratory distress. She has no wheezes. She has no rales. She exhibits tenderness ( Chaperone present for exam, patient has tenderness at the left parasternal border at the costochondral junction. Approximately mid chest, no rash).  Abdominal: Soft. Bowel sounds are normal. She exhibits no distension and no mass. There is no tenderness.  Musculoskeletal:  Normal range of motion. She exhibits no edema and no tenderness.  Lymphadenopathy:    She has no cervical adenopathy.  Neurological: She is alert. Coordination normal.  Skin: Skin is warm and dry. No rash noted. No erythema.  Psychiatric: She has a normal mood and affect. Her behavior is normal.    ED Course  Procedures (including critical care time) Labs Review Labs Reviewed - No data to display  Imaging Review Dg Chest 2 View  06/15/2014   CLINICAL DATA:  Cough, chest pain and pressure.  EXAM: CHEST  2 VIEW  COMPARISON:  Chest radiograph performed 02/15/2012  FINDINGS: The lungs are well-aerated and clear. There is no evidence of focal opacification, pleural effusion or pneumothorax.  The heart is normal in size; the mediastinal contour is within normal limits. No acute osseous abnormalities are seen.  IMPRESSION: No acute cardiopulmonary process seen.   Electronically Signed   By: Roanna RaiderJeffery  Chang M.D.   On: 06/15/2014 04:27     EKG Interpretation   Date/Time:  Monday June 15 2014 03:40:28 EDT Ventricular Rate:  58 PR Interval:  154 QRS Duration: 81 QT Interval:  423 QTC Calculation: 415 R Axis:   38 Text Interpretation:  Sinus bradycardia Normal ECG since last tracing no  significant change Confirmed by [place]  MD, Esha Fincher ([id number]) on 06/15/2014  3:44:08 AM      MDM   Final diagnoses:  Acute chest wall pain    The patient has normal vital signs including her heart rate which is in the 60s, doubt pulmonary embolism, reproducible pain one week after gastroenteritis, suspect costochondritis, pain medication, chest x-ray, EKG.  Xray and ECG normal, doubt PE or ACS,  Meds given in ED:  Medications  naproxen (NAPROSYN) tablet 500 mg (500 mg Oral Given 06/15/14 0335)    New Prescriptions   NAPROXEN (NAPROSYN) 500 MG TABLET    Take 1 tablet (500 mg total) by mouth 2 (two) times daily with a meal.      Vida RollerBrian D Calandro, MD 06/15/14 [phone]

## 2014-06-15 NOTE — ED Notes (Signed)
Patient states she woke up around 3am and began coughing; patient states has sharp chest pain.

## 2014-06-17 ENCOUNTER — Emergency Department (HOSPITAL_COMMUNITY)
Admission: EM | Admit: 2014-06-17 | Discharge: 2014-06-17 | Disposition: A | Discharge status: Home / Self Care | Payer: Medicaid Other | Attending: Emergency Medicine | Admitting: Emergency Medicine

## 2014-06-17 ENCOUNTER — Emergency Department (HOSPITAL_COMMUNITY): Payer: Medicaid Other

## 2014-06-17 ENCOUNTER — Encounter (HOSPITAL_COMMUNITY): Payer: Self-pay | Admitting: Emergency Medicine

## 2014-06-17 DIAGNOSIS — R0609 Other forms of dyspnea: Secondary | ICD-10-CM | POA: Insufficient documentation

## 2014-06-17 DIAGNOSIS — Z8742 Personal history of other diseases of the female genital tract: Secondary | ICD-10-CM | POA: Insufficient documentation

## 2014-06-17 DIAGNOSIS — R0989 Other specified symptoms and signs involving the circulatory and respiratory systems: Principal | ICD-10-CM | POA: Insufficient documentation

## 2014-06-17 DIAGNOSIS — Z8679 Personal history of other diseases of the circulatory system: Secondary | ICD-10-CM | POA: Insufficient documentation

## 2014-06-17 DIAGNOSIS — R0602 Shortness of breath: Secondary | ICD-10-CM | POA: Diagnosis present

## 2014-06-17 DIAGNOSIS — R1110 Vomiting, unspecified: Secondary | ICD-10-CM | POA: Diagnosis not present

## 2014-06-17 DIAGNOSIS — R0600 Dyspnea, unspecified: Secondary | ICD-10-CM

## 2014-06-17 DIAGNOSIS — Z791 Long term (current) use of non-steroidal anti-inflammatories (NSAID): Secondary | ICD-10-CM | POA: Insufficient documentation

## 2014-06-17 DIAGNOSIS — Z8619 Personal history of other infectious and parasitic diseases: Secondary | ICD-10-CM | POA: Diagnosis not present

## 2014-06-17 DIAGNOSIS — Z87440 Personal history of urinary (tract) infections: Secondary | ICD-10-CM | POA: Diagnosis not present

## 2014-06-17 DIAGNOSIS — Z79899 Other long term (current) drug therapy: Secondary | ICD-10-CM | POA: Insufficient documentation

## 2014-06-17 DIAGNOSIS — R062 Wheezing: Secondary | ICD-10-CM | POA: Diagnosis not present

## 2014-06-17 DIAGNOSIS — Z3202 Encounter for pregnancy test, result negative: Secondary | ICD-10-CM | POA: Diagnosis not present

## 2014-06-17 LAB — URINALYSIS, ROUTINE W REFLEX MICROSCOPIC
BILIRUBIN URINE: NEGATIVE
GLUCOSE, UA: NEGATIVE mg/dL
Ketones, ur: NEGATIVE mg/dL
Leukocytes, UA: NEGATIVE
Nitrite: NEGATIVE
PROTEIN: NEGATIVE mg/dL
SPECIFIC GRAVITY, URINE: 1.025 (ref 1.005–1.030)
UROBILINOGEN UA: 0.2 mg/dL (ref 0.0–1.0)
pH: 6 (ref 5.0–8.0)

## 2014-06-17 LAB — CBC WITH DIFFERENTIAL/PLATELET
Basophils Absolute: 0 10*3/uL (ref 0.0–0.1)
Basophils Relative: 0 % (ref 0–1)
Eosinophils Absolute: 0.1 10*3/uL (ref 0.0–0.7)
Eosinophils Relative: 2 % (ref 0–5)
HEMATOCRIT: 35.9 % — AB (ref 36.0–46.0)
HEMOGLOBIN: 11.8 g/dL — AB (ref 12.0–15.0)
LYMPHS ABS: 2.1 10*3/uL (ref 0.7–4.0)
Lymphocytes Relative: 45 % (ref 12–46)
MCH: 27 pg (ref 26.0–34.0)
MCHC: 32.9 g/dL (ref 30.0–36.0)
MCV: 82.2 fL (ref 78.0–100.0)
MONOS PCT: 7 % (ref 3–12)
Monocytes Absolute: 0.3 10*3/uL (ref 0.1–1.0)
Neutro Abs: 2.2 10*3/uL (ref 1.7–7.7)
Neutrophils Relative %: 46 % (ref 43–77)
Platelets: 218 10*3/uL (ref 150–400)
RBC: 4.37 MIL/uL (ref 3.87–5.11)
RDW: 13.4 % (ref 11.5–15.5)
WBC: 4.7 10*3/uL (ref 4.0–10.5)

## 2014-06-17 LAB — URINE MICROSCOPIC-ADD ON

## 2014-06-17 LAB — BASIC METABOLIC PANEL
Anion gap: 10 (ref 5–15)
BUN: 9 mg/dL (ref 6–23)
CO2: 25 mEq/L (ref 19–32)
CREATININE: 1.06 mg/dL (ref 0.50–1.10)
Calcium: 9.3 mg/dL (ref 8.4–10.5)
Chloride: 105 mEq/L (ref 96–112)
GFR calc Af Amer: 86 mL/min — ABNORMAL LOW (ref >90)
GFR, EST NON AFRICAN AMERICAN: 74 mL/min — AB (ref >90)
GLUCOSE: 85 mg/dL (ref 70–99)
POTASSIUM: 4.3 meq/L (ref 3.7–5.3)
Sodium: 140 mEq/L (ref 137–147)

## 2014-06-17 LAB — D-DIMER, QUANTITATIVE (NOT AT ARMC): D DIMER QUANT: 10.87 ug{FEU}/mL — AB (ref 0.00–0.48)

## 2014-06-17 LAB — PREGNANCY, URINE: Preg Test, Ur: NEGATIVE

## 2014-06-17 MED ORDER — IOHEXOL 350 MG/ML SOLN
100.0000 mL | Freq: Once | INTRAVENOUS | Status: AC | PRN
  Administered 2014-06-17: 100 mL via INTRAVENOUS

## 2014-06-17 NOTE — Discharge Instructions (Signed)

## 2014-06-17 NOTE — ED Notes (Signed)
C/o shortness of breath, states she was 3 days ago for same, states her breathing is not getting better

## 2014-06-17 NOTE — ED Notes (Signed)
Pt continues to feel SOB with exertion , no resp. Distress noted at this time

## 2014-06-17 NOTE — ED Provider Notes (Signed)
CSN: [id number]     Arrival date & time 06/17/14  1243 History  This chart was scribed for Flint MelterElliott L Devante Capano, MD by Roxy Cedarhandni Bhalodia, ED Scribe. This patient was seen in room APA14/APA14 and the patient's care was started at 2:23 PM.   Chief Complaint  Patient presents with  . Shortness of Breath   The history is provided by the patient. No language interpreter was used.    HPI Comments: Ricky Alaymesia D Barberi is a 23 y.o. female who presents to the Emergency Department complaining of constant SOB onset 2 days ago. She states that her SOB is worsened with walking. Patient reports associated wheezing. Patient has no prior history of SOB or asthma. Patient's LNMP was June 2015, and patient is unsure of chance of pregnancy. Patient denies fever and dysuria. Patient states her last ED visit was 2 days ago due to CP and she had 1 episode of emesis during her ED visit. Patient states she is a nonsmoker.   Past Medical History  Diagnosis Date  . Preterm labor without delivery in second trimester June 2012    Positive FFN in June  . UTI (lower urinary tract infection)   . Bacterial vaginosis   . Chlamydia   . Migraine    Past Surgical History  Procedure Laterality Date  . No past surgeries     Family History  Problem Relation Age of Onset  . Diabetes Other     great granmother  . Diabetes Paternal Grandfather   . Hypertension Paternal Grandfather   . Cancer Paternal Grandfather     prostate   History  Substance Use Topics  . Smoking status: Never Smoker   . Smokeless tobacco: Never Used  . Alcohol Use: No   OB History   Grav Para Term Preterm Abortions TAB SAB Ect Mult Living   2 2 1 1      2      [name]  Constitutional: Negative for fever.  Respiratory: Positive for shortness of breath and wheezing. Negative for cough.   Cardiovascular: Negative for leg swelling.  Gastrointestinal: Positive for vomiting. Negative for diarrhea and constipation.  Genitourinary: Negative  for dysuria.  All other systems reviewed and are negative.   Allergies  Review of patient's allergies indicates no known allergies.  Home Medications   Prior to Admission medications   Medication Sig Start Date End Date Taking? Authorizing Provider  dicyclomine (BENTYL) 20 MG tablet Take one every 6 hours for abd cramps 06/09/14  Yes Benny LennertJoseph L Zammit, MD  naproxen (NAPROSYN) 500 MG tablet Take 1 tablet (500 mg total) by mouth 2 (two) times daily with a meal. 06/15/14  Yes Vida RollerBrian D Paino, MD  promethazine (PHENERGAN) 25 MG tablet Take 1 tablet (25 mg total) by mouth every 6 (six) hours as needed for nausea or vomiting. 06/09/14  Yes Benny LennertJoseph L Zammit, MD  promethazine (PHENERGAN) 25 MG tablet Take 1 tablet (25 mg total) by mouth every 6 (six) hours as needed for nausea. 02/15/12 02/22/12  Linwood DibblesJon Knapp, MD   Triage Vitals: BP 101/64  Pulse 73  Temp(Src) 99.1 F (37.3 C) (Oral)  Resp 16  Ht 5\' 7"  (1.702 m)  Wt 276 lb (125.193 kg)  BMI 43.22 kg/m2  SpO2 100%  LMP 04/28/2014  Physical Exam  Nursing note and vitals reviewed. Constitutional: She is oriented to person, place, and time. She appears well-developed and well-nourished.  HENT:  Head: Normocephalic and atraumatic.  Eyes: Conjunctivae and EOM are normal.  Pupils are equal, round, and reactive to light.  Neck: Normal range of motion and phonation normal. Neck supple.  Cardiovascular: Normal rate, regular rhythm and intact distal pulses.   Pulmonary/Chest: Effort normal and breath sounds normal. She exhibits no tenderness.  Abdominal: Soft. She exhibits no distension. There is no tenderness. There is no guarding.  Musculoskeletal: Normal range of motion.  Neurological: She is alert and oriented to person, place, and time. She exhibits normal muscle tone.  Skin: Skin is warm and dry.  Psychiatric: She has a normal mood and affect. Her behavior is normal. Judgment and thought content normal.    ED Course  Procedures (including critical  care time)  DIAGNOSTIC STUDIES: Oxygen Saturation is 100% on RA, Normal by my interpretation.    COORDINATION OF CARE:  Medications  iohexol (OMNIPAQUE) 350 MG/ML injection 100 mL (100 mLs Intravenous Contrast Given 06/17/14 1708)    No data found.  2:28 PM- Pt advised of plan for treatment and pt agrees.  Labs Review Labs Reviewed  CBC WITH DIFFERENTIAL - Abnormal; Notable for the following:    Hemoglobin 11.8 (*)    HCT 35.9 (*)    All other components within normal limits  BASIC METABOLIC PANEL - Abnormal; Notable for the following:    GFR calc non Af Amer 74 (*)    GFR calc Af Amer 86 (*)    All other components within normal limits  URINALYSIS, ROUTINE W REFLEX MICROSCOPIC - Abnormal; Notable for the following:    Hgb urine dipstick MODERATE (*)    All other components within normal limits  D-DIMER, QUANTITATIVE - Abnormal; Notable for the following:    D-Dimer, Quant 10.87 (*)    All other components within normal limits  PREGNANCY, URINE  URINE MICROSCOPIC-ADD ON    Imaging Review Dg Chest 2 View  06/17/2014   CLINICAL DATA:  Shortness of breath  EXAM: CHEST  2 VIEW  COMPARISON:  PA and lateral chest of June 15, 2014  FINDINGS: The lungs are well-expanded and clear. The heart and mediastinal structures are normal. There is no pleural effusion. The bony thorax is unremarkable.  IMPRESSION: There is no active cardiopulmonary disease.   Electronically Signed   By: David  [place]   On: 06/17/2014 15:13   Ct Angio Chest Pe W/cm &/or Wo Cm  06/17/2014   CLINICAL DATA:  Shortness of breath for 3 days.  Elevated D-dimer.  EXAM: CT ANGIOGRAPHY CHEST WITH CONTRAST  TECHNIQUE: Multidetector CT imaging of the chest was performed using the standard protocol during bolus administration of intravenous contrast. Multiplanar CT image reconstructions and MIPs were obtained to evaluate the vascular anatomy.  CONTRAST:  100 mL OMNIPAQUE IOHEXOL 350 MG/ML SOLN  COMPARISON:  PA and lateral  chest 06/17/2014.  FINDINGS: No pulmonary embolus is identified. Heart size is normal. There is no pleural or pericardial effusion. No axillary, hilar or mediastinal lymphadenopathy is identified. The lungs are clear. Visualized intra-abdominal contents are unremarkable. No focal bony abnormality is identified. Schmorl's nodes in the thoracic spine are incidentally noted.  Review of the MIP images confirms the above findings.  IMPRESSION: Negative for pulmonary embolus.  Negative chest CT.   Electronically Signed   By: Drusilla Kanner M.D.   On: 06/17/2014 17:49     EKG Interpretation None      MDM   Final diagnoses:  Dyspnea    Nonspecific sob, with negative ED evaluation for PE, Bronchitis, or PNE. Doubt cardiac disease, SBI or anxiety.  Nursing Notes Reviewed/ Care Coordinated Applicable Imaging Reviewed Interpretation of Laboratory Data incorporated into ED treatment  The patient appears reasonably screened and/or stabilized for discharge and I doubt any other medical condition or other [facility] requiring further screening, evaluation, or treatment in the ED at this time prior to discharge.  Plan: Home Medications- usual; Home Treatments- rest; return here if the recommended treatment, does not improve the symptoms; Recommended follow up- PCP for check up in 5-7 days  I personally performed the services described in this documentation, which was scribed in my presence. The recorded information has been reviewed and is accurate.      Flint Melter, MD 06/19/14 [phone]

## 2014-06-23 ENCOUNTER — Emergency Department (HOSPITAL_COMMUNITY)
Admission: EM | Admit: 2014-06-23 | Discharge: 2014-06-23 | Disposition: A | Discharge status: Home / Self Care | Payer: Medicaid Other | Attending: Emergency Medicine | Admitting: Emergency Medicine

## 2014-06-23 ENCOUNTER — Emergency Department (HOSPITAL_COMMUNITY): Payer: Medicaid Other

## 2014-06-23 ENCOUNTER — Encounter (HOSPITAL_COMMUNITY): Payer: Self-pay | Admitting: Emergency Medicine

## 2014-06-23 DIAGNOSIS — J40 Bronchitis, not specified as acute or chronic: Secondary | ICD-10-CM | POA: Insufficient documentation

## 2014-06-23 DIAGNOSIS — Z87440 Personal history of urinary (tract) infections: Secondary | ICD-10-CM | POA: Insufficient documentation

## 2014-06-23 DIAGNOSIS — Z79899 Other long term (current) drug therapy: Secondary | ICD-10-CM | POA: Diagnosis not present

## 2014-06-23 DIAGNOSIS — Z8619 Personal history of other infectious and parasitic diseases: Secondary | ICD-10-CM | POA: Insufficient documentation

## 2014-06-23 DIAGNOSIS — J029 Acute pharyngitis, unspecified: Secondary | ICD-10-CM | POA: Insufficient documentation

## 2014-06-23 DIAGNOSIS — Z791 Long term (current) use of non-steroidal anti-inflammatories (NSAID): Secondary | ICD-10-CM | POA: Insufficient documentation

## 2014-06-23 DIAGNOSIS — Z8742 Personal history of other diseases of the female genital tract: Secondary | ICD-10-CM | POA: Insufficient documentation

## 2014-06-23 DIAGNOSIS — Z8679 Personal history of other diseases of the circulatory system: Secondary | ICD-10-CM | POA: Diagnosis not present

## 2014-06-23 DIAGNOSIS — R51 Headache: Secondary | ICD-10-CM | POA: Diagnosis not present

## 2014-06-23 DIAGNOSIS — R042 Hemoptysis: Secondary | ICD-10-CM | POA: Diagnosis present

## 2014-06-23 LAB — RAPID STREP SCREEN (MED CTR MEBANE ONLY): Streptococcus, Group A Screen (Direct): NEGATIVE

## 2014-06-23 MED ORDER — AMOXICILLIN 500 MG PO CAPS: 500.0000 mg | ORAL_CAPSULE | Freq: Three times a day (TID) | ORAL | Status: DC

## 2014-06-23 NOTE — Discharge Instructions (Signed)
Follow up if not improving.  Tylenol or motrin for aches and pain

## 2014-06-23 NOTE — ED Provider Notes (Signed)
CSN: [id number]     Arrival date & time 06/23/14  1030 History  This chart was scribed for [name], MD by Leone Payor, ED Scribe. This patient was seen in room APA19/APA19 and the patient's care was started 12:30 PM.    Chief Complaint  Patient presents with  . Hemoptysis    Patient is a 23 y.o. female presenting with pharyngitis. The history is provided by the patient. No language interpreter was used.  Sore Throat This is a new problem. The current episode started yesterday. The problem occurs constantly. The problem has been gradually worsening. Associated symptoms include headaches. Pertinent negatives include no chest pain and no abdominal pain. She has tried nothing for the symptoms. The treatment provided no relief.    HPI Comments: Patricia [name] is a 23 y.o. female who presents to the Emergency Department complaining of 1 day of gradual onset, gradually worsening, constant sore throat with associated HA and cough. Patient states she had an episode of coughing this morning productive of blood streaked sputum. She rates her sore throat as an 9/10 currently and states swallowing worsens her pain. She denies fever, difficulty swallowing.   Past Medical History  Diagnosis Date  . Preterm labor without delivery in second trimester June 2012    Positive FFN in June  . UTI (lower urinary tract infection)   . Bacterial vaginosis   . Chlamydia   . Migraine    Past Surgical History  Procedure Laterality Date  . No past surgeries     Family History  Problem Relation Age of Onset  . Diabetes Other     great granmother  . Diabetes Paternal Grandfather   . Hypertension Paternal Grandfather   . Cancer Paternal Grandfather     prostate   History  Substance Use Topics  . Smoking status: Never Smoker   . Smokeless tobacco: Never Used  . Alcohol Use: No   OB History   Grav Para Term Preterm Abortions TAB SAB Ect Mult Living   2 2 1 1      2      [name]   Constitutional: Negative for fever, appetite change and fatigue.  HENT: Positive for sore throat. Negative for congestion, ear discharge, sinus pressure and trouble swallowing.   Eyes: Negative for discharge.  Respiratory: Positive for cough.   Cardiovascular: Negative for chest pain.  Gastrointestinal: Negative for abdominal pain and diarrhea.  Genitourinary: Negative for frequency and hematuria.  Musculoskeletal: Negative for back pain.  Skin: Negative for rash.  Neurological: Positive for headaches. Negative for seizures.  Psychiatric/Behavioral: Negative for hallucinations.      Allergies  Review of patient's allergies indicates no known allergies.  Home Medications   Prior to Admission medications   Medication Sig Start Date End Date Taking? Authorizing Provider  acetaminophen (TYLENOL) 500 MG tablet Take 1,000 mg by mouth every 6 (six) hours as needed for headache.   Yes Historical Provider, MD  naproxen (NAPROSYN) 500 MG tablet Take 1 tablet (500 mg total) by mouth 2 (two) times daily with a meal. 06/15/14  Yes Vida Roller, MD   BP 104/75  Pulse 83  Temp(Src) 99.3 F (37.4 C) (Oral)  Resp 20  Ht 5\' 7"  (1.702 m)  Wt 289 lb (131.09 kg)  BMI 45.25 kg/m2  SpO2 100%  LMP 05/28/2014 Physical Exam  Constitutional: She is oriented to person, place, and time. She appears well-developed.  HENT:  Head: Normocephalic.  Posterior oropharynx is mildly  inflamed    Eyes: Conjunctivae and EOM are normal. No scleral icterus.  Neck: Neck supple. No thyromegaly present.  Anterior lymph nodes are tender  Cardiovascular: Normal rate and regular rhythm.  Exam reveals no gallop and no friction rub.   No murmur heard. Pulmonary/Chest: No stridor. She has no wheezes. She has no rales. She exhibits no tenderness.  Abdominal: She exhibits no distension. There is no tenderness. There is no rebound.  Musculoskeletal: Normal range of motion. She exhibits edema (1+ edema to bilateral lower  legs).  Lymphadenopathy:    She has cervical adenopathy.  Neurological: She is oriented to person, place, and time. She exhibits normal muscle tone. Coordination normal.  Skin: No rash noted. No erythema.  Psychiatric: She has a normal mood and affect. Her behavior is normal.    ED Course  Procedures (including critical care time)  DIAGNOSTIC STUDIES: Oxygen Saturation is 100% on RA, normal by my interpretation.    COORDINATION OF CARE: 12:33 PM Will order strep test. Discussed treatment plan with pt at bedside and pt agreed to plan.   Labs Review Labs Reviewed - No data to display  Imaging Review Dg Chest 2 View  06/23/2014   CLINICAL DATA:  Hemoptysis since this morning ; no history of tobacco use  EXAM: CHEST  2 VIEW  COMPARISON:  Chest CT scan dated June 17, 2014 and PA and lateral chest x-ray of the same day.  FINDINGS: The lungs are adequately inflated and clear. The heart and mediastinal structures are normal. There is no pneumothorax or pneumomediastinum or pleural effusion. The bony thorax is unremarkable.  IMPRESSION: There is no active cardiopulmonary disease.   Electronically Signed   By: David  SwazilandJordan   On: 06/23/2014 14:54     EKG Interpretation None      MDM   Final diagnoses:  None  pharyngitis  The chart was scribed for me under my direct supervision.  I personally performed the history, physical, and medical decision making and all procedures in the evaluation of this patient..'   Benny LennertJoseph L Sahid Borba, MD 06/23/14 [id number]

## 2014-06-23 NOTE — ED Notes (Signed)
Pt c/o headache, sore throat that started yesterday, coughing up mucous that had bright red blood mixed in with it that started this am, denies any fever, denies any n/v/d

## 2014-06-25 LAB — CULTURE, GROUP A STREP

## 2014-07-07 ENCOUNTER — Telehealth: Payer: Self-pay | Admitting: Advanced Practice Midwife

## 2014-07-09 NOTE — Telephone Encounter (Signed)
Pt states having a discharge from her breast "thinks its breast milk but had a negative pregnancy test. Call transferred to front staff for an appt to be made with Rodena PietyFran Cresenzo, CNM.

## 2014-07-15 ENCOUNTER — Ambulatory Visit (INDEPENDENT_AMBULATORY_CARE_PROVIDER_SITE_OTHER): Payer: Medicaid Other | Admitting: Advanced Practice Midwife

## 2014-07-15 ENCOUNTER — Encounter: Payer: Self-pay | Admitting: Advanced Practice Midwife

## 2014-07-15 VITALS — BP 102/64 | Ht 67.0 in | Wt 295.0 lb

## 2014-07-15 DIAGNOSIS — N643 Galactorrhea not associated with childbirth: Secondary | ICD-10-CM

## 2014-07-15 MED ORDER — ETONOGESTREL-ETHINYL ESTRADIOL 0.12-0.015 MG/24HR VA RING: VAGINAL_RING | VAGINAL | Status: DC

## 2014-07-15 NOTE — Progress Notes (Signed)
[facility] Visit  Patient name: Patricia [name] Rosser MRN [id number]  Date of birth: [birth year]  CC & HPI:  Patricia [name] Marts is a 23 y.o. African American female presenting today for c/o having what smelled like breast milk leak out of her left breast on Wednesday.  Has not seen any since then. Is not really on birth control (put a nuva ring in in May and has been using the same one over and over).  Pertinent History Reviewed:  Medical & Surgical Hx:   Past Medical History  Diagnosis Date  . Preterm labor without delivery in second trimester June 2012    Positive FFN in June  . UTI (lower urinary tract infection)   . Bacterial vaginosis   . Chlamydia   . Migraine    Past Surgical History  Procedure Laterality Date  . No past surgeries      Medications: Reviewed & Updated - see associated section Social History: Reviewed -  reports that she has never smoked. She has never used smokeless tobacco.  Objective Findings:  Vitals: BP 102/64  Ht 5\' 7"  (1.702 m)  Wt 295 lb (133.811 kg)  BMI 46.19 kg/m2  LMP 05/28/2014  Physical Examination: General appearance - alert, well appearing, and in no distress Mental status - alert, oriented to person, place, and time Breasts - breasts appear normal, no suspicious masses, no skin or nipple changes or axillary nodes.  Unable to express any discharge today No results found for this or any previous visit (from the past 24 hour(s)).   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  Pt aware of her mistake and that she needs condoms for the next 3 weeks  CRESENZO-DISHMAN,Larkin Alfred CNM 07/15/2014 9:07 AM

## 2014-07-16 LAB — PROLACTIN: Prolactin: 9.3 ng/mL

## 2014-07-21 ENCOUNTER — Telehealth: Payer: Self-pay | Admitting: Advanced Practice Midwife

## 2014-07-21 NOTE — Telephone Encounter (Signed)
Pt informed of Fran's response and verbalized understanding.

## 2014-07-21 NOTE — Telephone Encounter (Signed)
Prolactin level normal--no treatment needed for small amounts of breast milk 1 year after having a baby

## 2014-07-21 NOTE — Telephone Encounter (Signed)
Patricia [name], this pt called for her lab results and I informed her that her prolactin level was normal and now she is wanting to know what she should do from here.  She is still having the discharge from her breast, she states that it is not a lot but she is still having it.  Please advise.

## 2014-09-03 ENCOUNTER — Emergency Department (HOSPITAL_COMMUNITY)
Admission: EM | Admit: 2014-09-03 | Discharge: 2014-09-04 | Disposition: A | Discharge status: Home / Self Care | Payer: Medicaid Other | Attending: Emergency Medicine | Admitting: Emergency Medicine

## 2014-09-03 ENCOUNTER — Encounter (HOSPITAL_COMMUNITY): Payer: Self-pay | Admitting: Emergency Medicine

## 2014-09-03 DIAGNOSIS — Z3202 Encounter for pregnancy test, result negative: Secondary | ICD-10-CM | POA: Diagnosis not present

## 2014-09-03 DIAGNOSIS — Z8619 Personal history of other infectious and parasitic diseases: Secondary | ICD-10-CM | POA: Insufficient documentation

## 2014-09-03 DIAGNOSIS — N12 Tubulo-interstitial nephritis, not specified as acute or chronic: Secondary | ICD-10-CM | POA: Insufficient documentation

## 2014-09-03 DIAGNOSIS — Z794 Long term (current) use of insulin: Secondary | ICD-10-CM | POA: Insufficient documentation

## 2014-09-03 DIAGNOSIS — Z8679 Personal history of other diseases of the circulatory system: Secondary | ICD-10-CM | POA: Diagnosis not present

## 2014-09-03 DIAGNOSIS — R109 Unspecified abdominal pain: Secondary | ICD-10-CM | POA: Diagnosis present

## 2014-09-03 DIAGNOSIS — Z87440 Personal history of urinary (tract) infections: Secondary | ICD-10-CM | POA: Insufficient documentation

## 2014-09-03 DIAGNOSIS — Z792 Long term (current) use of antibiotics: Secondary | ICD-10-CM | POA: Diagnosis not present

## 2014-09-03 LAB — URINALYSIS, ROUTINE W REFLEX MICROSCOPIC
BILIRUBIN URINE: NEGATIVE
Glucose, UA: NEGATIVE mg/dL
KETONES UR: NEGATIVE mg/dL
NITRITE: POSITIVE — AB
PROTEIN: 30 mg/dL — AB
Specific Gravity, Urine: 1.01 (ref 1.005–1.030)
UROBILINOGEN UA: 0.2 mg/dL (ref 0.0–1.0)
pH: 6 (ref 5.0–8.0)

## 2014-09-03 LAB — CBC
HEMATOCRIT: 34.9 % — AB (ref 36.0–46.0)
HEMOGLOBIN: 11.8 g/dL — AB (ref 12.0–15.0)
MCH: 27.6 pg (ref 26.0–34.0)
MCHC: 33.8 g/dL (ref 30.0–36.0)
MCV: 81.5 fL (ref 78.0–100.0)
Platelets: 227 10*3/uL (ref 150–400)
RBC: 4.28 MIL/uL (ref 3.87–5.11)
RDW: 12.9 % (ref 11.5–15.5)
WBC: 7.2 10*3/uL (ref 4.0–10.5)

## 2014-09-03 LAB — URINE MICROSCOPIC-ADD ON

## 2014-09-03 LAB — PREGNANCY, URINE: Preg Test, Ur: NEGATIVE

## 2014-09-03 NOTE — ED Notes (Signed)
Patient states at 2200 she began to get hot and then cold. Patient states she began to get anxious and short of breath. Patient states she had a panic attack in July, this feels the same "but worse" per patient. Patient is calm, cooperative A&OX4 at this time.

## 2014-09-04 LAB — COMPREHENSIVE METABOLIC PANEL
ALT: 14 U/L (ref 0–35)
ANION GAP: 14 (ref 5–15)
AST: 14 U/L (ref 0–37)
Albumin: 3.6 g/dL (ref 3.5–5.2)
Alkaline Phosphatase: 61 U/L (ref 39–117)
BILIRUBIN TOTAL: 0.4 mg/dL (ref 0.3–1.2)
BUN: 11 mg/dL (ref 6–23)
CHLORIDE: 102 meq/L (ref 96–112)
CO2: 21 mEq/L (ref 19–32)
Calcium: 9 mg/dL (ref 8.4–10.5)
Creatinine, Ser: 1.11 mg/dL — ABNORMAL HIGH (ref 0.50–1.10)
GFR calc non Af Amer: 70 mL/min — ABNORMAL LOW (ref >90)
GFR, EST AFRICAN AMERICAN: 81 mL/min — AB (ref >90)
GLUCOSE: 94 mg/dL (ref 70–99)
Potassium: 3.8 mEq/L (ref 3.7–5.3)
Sodium: 137 mEq/L (ref 137–147)
Total Protein: 8 g/dL (ref 6.0–8.3)

## 2014-09-04 MED ORDER — FLUCONAZOLE 150 MG PO TABS: ORAL_TABLET | ORAL | Status: DC

## 2014-09-04 MED ORDER — DEXTROSE 5 % IV SOLN
1.0000 g | Freq: Once | INTRAVENOUS | Status: AC
  Administered 2014-09-04: 1 g via INTRAVENOUS
  Filled 2014-09-04: qty 10

## 2014-09-04 MED ORDER — CIPROFLOXACIN HCL 500 MG PO TABS: 500.0000 mg | ORAL_TABLET | Freq: Two times a day (BID) | ORAL | Status: DC

## 2014-09-04 MED ORDER — HYDROCODONE-ACETAMINOPHEN 5-325 MG PO TABS: 1.0000 | ORAL_TABLET | Freq: Four times a day (QID) | ORAL | Status: DC | PRN

## 2014-09-04 MED ORDER — ACETAMINOPHEN 500 MG PO TABS
1000.0000 mg | ORAL_TABLET | Freq: Once | ORAL | Status: AC
  Administered 2014-09-04: 1000 mg via ORAL
  Filled 2014-09-04: qty 2

## 2014-09-04 NOTE — ED Notes (Signed)
Patient verbalizes understanding of discharge instructions, prescription medications, and follow up care with PCP. Patient ambulatory out of department at this time with family member.

## 2014-09-04 NOTE — ED Provider Notes (Addendum)
CSN: [id number]     Arrival date & time 09/03/14  2257 History  This chart was scribed for Hanley Seamen, MD by Milly Jakob, ED Scribe. The patient was seen in room APA05/APA05. Patient's care was started at 12:03 AM.   Chief Complaint  Patient presents with  . Panic Attack   The history is provided by the patient. No language interpreter was used.   HPI Comments: Patricia [name] is a 23 y.o. female who was brought in by EMS to the Emergency Department complaining of chills alternating with periods of feeling hot began about 10 PM. Symptoms were similar to, but worse than, prior panic attacks. She has also had right flank pain radiating to her right lower quadrant for the past 2-3 days. She was short of breath earlier, possibly due to hyperventilation, which has resolved. She reports urinary frequency. She denies dysuria, vaginal bleeding, or discharge. She additionally reports several days of pain in her right ankle, and she denies injury.     Past Medical History  Diagnosis Date  . Preterm labor without delivery in second trimester June 2012    Positive FFN in June  . UTI (lower urinary tract infection)   . Bacterial vaginosis   . Chlamydia   . Migraine    Past Surgical History  Procedure Laterality Date  . No past surgeries     Family History  Problem Relation Age of Onset  . Diabetes Other     great granmother  . Diabetes Paternal Grandfather   . Hypertension Paternal Grandfather   . Cancer Paternal Grandfather     prostate   History  Substance Use Topics  . Smoking status: Never Smoker   . Smokeless tobacco: Never Used  . Alcohol Use: No   OB History   Grav Para Term Preterm Abortions TAB SAB Ect Mult Living   2 2 1 1      2      [name] A complete 10 system review of systems was obtained and all systems are negative except as noted in the HPI and PMH.   Allergies  Review of patient's allergies indicates no known allergies.  Home Medications    Prior to Admission medications   Medication Sig Start Date End Date Taking? Authorizing Provider  acetaminophen (TYLENOL) 500 MG tablet Take 1,000 mg by mouth every 6 (six) hours as needed for headache.   Yes Historical Provider, MD  etonogestrel-ethinyl estradiol (NUVARING) 0.12-0.015 MG/24HR vaginal ring Insert vaginally and leave in place for 3 consecutive weeks, then remove for 1 week. 07/15/14  Yes [name], CNM  amoxicillin (AMOXIL) 500 MG capsule Take 1 capsule (500 mg total) by mouth 3 (three) times daily. 06/23/14   [name], MD  naproxen (NAPROSYN) 500 MG tablet Take 1 tablet (500 mg total) by mouth 2 (two) times daily with a meal. 06/15/14   Vida Roller, MD   Triage Vitals: BP 116/64  Pulse 122  Temp(Src) 103 F (39.4 C) (Oral)  Resp 20  Ht 5\' 7"  (1.702 m)  Wt 297 lb (134.718 kg)  BMI 46.51 kg/m2  SpO2 96%  LMP 05/27/2014 Physical Exam General: Well-developed, well-nourished female in no acute distress; appearance consistent with age of record HENT: normocephalic; atraumatic Eyes: pupils equal, round and reactive to light; extraocular muscles intact Neck: supple Heart: regular rate and rhythm; tachycardic  Lungs: clear to auscultation bilaterally Abdomen: soft; nondistended; RLQ tenderness; no masses or hepatosplenomegaly; bowel sounds present GU: right  CVA tenderness Extremities: No deformity; full range of motion; pulses normal; right ankle tender without deformity, swelling, erythema, or warmth Neurologic: Awake, alert and oriented; motor function intact in all extremities and symmetric; no facial droop Skin: Warm and dry Psychiatric: Flat affect  ED Course  Procedures (including critical care time) DIAGNOSTIC STUDIES: Oxygen Saturation is 96% on room air, normal by my interpretation.    COORDINATION OF CARE: 12:08 AM-Discussed treatment plan which includes CBC, UA, and CMP with pt at bedside and pt agreed to plan.    MDM   Nursing  notes and vitals signs, including pulse oximetry, reviewed.  Summary of this visit's results, reviewed by myself:   EKG Interpretation  Date/Time:    Ventricular Rate:    PR Interval:    QRS Duration:   QT Interval:    QTC Calculation:   R Axis:     Text Interpretation:         Labs:  Results for orders placed during the hospital encounter of 09/03/14 (from the past 24 hour(s))  CBC     Status: Abnormal   Collection Time    09/03/14 11:15 PM      Result Value Ref Range   WBC 7.2  4.0 - 10.5 K/uL   RBC 4.28  3.87 - 5.11 MIL/uL   Hemoglobin 11.8 (*) 12.0 - 15.0 g/dL   HCT 16.134.9 (*) 09.636.0 - 04.546.0 %   MCV 81.5  78.0 - 100.0 fL   MCH 27.6  26.0 - 34.0 pg   MCHC 33.8  30.0 - 36.0 g/dL   RDW 40.912.9  81.111.5 - 91.415.5 %   Platelets 227  150 - 400 K/uL  COMPREHENSIVE METABOLIC PANEL     Status: Abnormal   Collection Time    09/03/14 11:15 PM      Result Value Ref Range   Sodium 137  137 - 147 mEq/L   Potassium 3.8  3.7 - 5.3 mEq/L   Chloride 102  96 - 112 mEq/L   CO2 21  19 - 32 mEq/L   Glucose, Bld 94  70 - 99 mg/dL   BUN 11  6 - 23 mg/dL   Creatinine, Ser 7.821.11 (*) 0.50 - 1.10 mg/dL   Calcium 9.0  8.4 - 95.610.5 mg/dL   Total Protein 8.0  6.0 - 8.3 g/dL   Albumin 3.6  3.5 - 5.2 g/dL   AST 14  0 - 37 U/L   ALT 14  0 - 35 U/L   Alkaline Phosphatase 61  39 - 117 U/L   Total Bilirubin 0.4  0.3 - 1.2 mg/dL   GFR calc non Af Amer 70 (*) >90 mL/min   GFR calc Af Amer 81 (*) >90 mL/min   Anion gap 14  5 - 15  URINALYSIS, ROUTINE W REFLEX MICROSCOPIC     Status: Abnormal   Collection Time    09/03/14 11:15 PM      Result Value Ref Range   Color, Urine YELLOW  YELLOW   APPearance HAZY (*) CLEAR   Specific Gravity, Urine 1.010  1.005 - 1.030   pH 6.0  5.0 - 8.0   Glucose, UA NEGATIVE  NEGATIVE mg/dL   Hgb urine dipstick LARGE (*) NEGATIVE   Bilirubin Urine NEGATIVE  NEGATIVE   Ketones, ur NEGATIVE  NEGATIVE mg/dL   Protein, ur 30 (*) NEGATIVE mg/dL   Urobilinogen, UA 0.2  0.0 - 1.0  mg/dL   Nitrite POSITIVE (*) NEGATIVE   Leukocytes, UA  MODERATE (*) NEGATIVE  PREGNANCY, URINE     Status: None   Collection Time    09/03/14 11:15 PM      Result Value Ref Range   Preg Test, Ur NEGATIVE  NEGATIVE  URINE MICROSCOPIC-ADD ON     Status: Abnormal   Collection Time    09/03/14 11:15 PM      Result Value Ref Range   Squamous Epithelial / LPF RARE  RARE   WBC, UA TOO NUMEROUS TO COUNT  <3 WBC/hpf   RBC / HPF 0-2  <3 RBC/hpf   Bacteria, UA MANY (*) RARE   Rocephin 1g IV ordered for pyelonephritis.  I personally performed the services described in this documentation, which was scribed in my presence. The recorded information has been reviewed and is accurate.    Hanley SeamenJohn L Tashya Alberty, MD 09/04/14 0021  Hanley SeamenJohn L Anvitha Hutmacher, MD 09/04/14 [id number]

## 2014-09-04 NOTE — Discharge Instructions (Signed)
Pyelonephritis, Adult Pyelonephritis is a kidney infection. In general, there are 2 main types of pyelonephritis:  Infections that come on quickly without any warning (acute pyelonephritis).  Infections that persist for a long period of time (chronic pyelonephritis). CAUSES  Two main causes of pyelonephritis are:  Bacteria traveling from the bladder to the kidney. This is a problem especially in pregnant women. The urine in the bladder can become filled with bacteria from multiple causes, including:  Inflammation of the prostate gland (prostatitis).  Sexual intercourse in females.  Bladder infection (cystitis).  Bacteria traveling from the bloodstream to the tissue part of the kidney. Problems that may increase your risk of getting a kidney infection include:  Diabetes.  Kidney stones or bladder stones.  Cancer.  Catheters placed in the bladder.  Other abnormalities of the kidney or ureter. SYMPTOMS   Abdominal pain.  Pain in the side or flank area.  Fever.  Chills.  Upset stomach.  Blood in the urine (dark urine).  Frequent urination.  Strong or persistent urge to urinate.  Burning or stinging when urinating. DIAGNOSIS  Your caregiver may diagnose your kidney infection based on your symptoms. A urine sample may also be taken. TREATMENT  In general, treatment depends on how severe the infection is.   If the infection is mild and caught early, your caregiver may treat you with oral antibiotics and send you home.  If the infection is more severe, the bacteria may have gotten into the bloodstream. This will require intravenous (IV) antibiotics and a hospital stay. Symptoms may include:  High fever.  Severe flank pain.  Shaking chills.  Even after a hospital stay, your caregiver may require you to be on oral antibiotics for a period of time.  Other treatments may be required depending upon the cause of the infection. HOME CARE INSTRUCTIONS   Take your  antibiotics as directed. Finish them even if you start to feel better.  Make an appointment to have your urine checked to make sure the infection is gone.  Drink enough fluids to keep your urine clear or pale yellow.  Take medicines for the bladder if you have urgency and frequency of urination as directed by your caregiver. SEEK IMMEDIATE MEDICAL CARE IF:   You have a fever or persistent symptoms for more than 2-3 days.  You are unable to take your antibiotics or fluids.  You experience extreme weakness or fainting.  There is no improvement after 2 days of treatment. MAKE SURE YOU:  Understand these instructions.  Will watch your condition.  Will get help right away if you are not doing well or get worse. Document Released: 11/13/2005 Document Revised: 05/14/2012 Document Reviewed: 04/19/2011 [facility] Patient Information 2015 [place], [place]. This information is not intended to replace advice given to you by your health care provider. Make sure you discuss any questions you have with your health care provider.

## 2014-09-07 LAB — URINE CULTURE

## 2014-09-08 ENCOUNTER — Telehealth (HOSPITAL_BASED_OUTPATIENT_CLINIC_OR_DEPARTMENT_OTHER): Payer: Self-pay | Admitting: Emergency Medicine

## 2014-09-08 NOTE — Telephone Encounter (Signed)
Post ED Visit - Positive Culture Follow-up  Culture report reviewed by antimicrobial stewardship pharmacist: []  Wes Dulaney, Pharm.D., BCPS []  [name], Pharm.D., BCPS []  [name], Pharm.D., BCPS []  MortonMinh Pham, [address] Rainbow BoulevardPharm.D., BCPS, AAHIVP []  [name], Pharm.D., BCPS, AAHIVP []  Carly Sabat, Pharm.D. []  [name], Pharm.D.  Positive urine culture Treated with Ciprofloxacin, organism sensitive to the same and no further patient follow-up is required at this time.  Berle MullMiller, Drury Ardizzone 09/08/2014, 9:47 AM

## 2014-09-28 ENCOUNTER — Encounter (HOSPITAL_COMMUNITY): Payer: Self-pay | Admitting: Emergency Medicine

## 2014-10-08 ENCOUNTER — Ambulatory Visit (INDEPENDENT_AMBULATORY_CARE_PROVIDER_SITE_OTHER): Payer: Medicaid Other | Admitting: Adult Health

## 2014-10-08 ENCOUNTER — Encounter: Payer: Self-pay | Admitting: Adult Health

## 2014-10-08 VITALS — BP 130/78 | Ht 67.0 in | Wt 291.5 lb

## 2014-10-08 DIAGNOSIS — Z3202 Encounter for pregnancy test, result negative: Secondary | ICD-10-CM

## 2014-10-08 DIAGNOSIS — N926 Irregular menstruation, unspecified: Secondary | ICD-10-CM

## 2014-10-08 LAB — POCT URINE PREGNANCY: Preg Test, Ur: NEGATIVE

## 2014-10-08 NOTE — Progress Notes (Signed)
Subjective:     Patient ID: Patricia [name], female   DOB: [birth year], 23 y.o.   MRN: [id number]  HPI Patricia [name] is a 23 year old black female in for UPT, LMP 08/24/14 is using nuva ring and has never missed a period til now and had 1 day of nausea.  [name] See HPI Reviewed past medical,surgical, social and family history. Reviewed medications and allergies.     Objective:   Physical Exam BP 130/78 mmHg  Ht 5\' 7"  (1.702 m)  Wt 291 lb 8 oz (132.224 kg)  BMI 45.64 kg/m2  LMP 08/24/2014  Breastfeeding? NoUPT negative, will check QHCG    Assessment:     Missed period    Plan:     Check QHCG Continue nuva ring Will talk in am

## 2014-10-08 NOTE — Patient Instructions (Signed)
Continue nuva ring  Will talk in am

## 2014-10-09 ENCOUNTER — Telehealth: Payer: Self-pay | Admitting: Adult Health

## 2014-10-09 LAB — HCG, QUANTITATIVE, PREGNANCY

## 2014-10-09 NOTE — Telephone Encounter (Signed)
Pt aware PT negative

## 2014-12-11 ENCOUNTER — Telehealth: Payer: Self-pay | Admitting: Advanced Practice Midwife

## 2014-12-11 NOTE — Telephone Encounter (Signed)
Pt states having heavy vaginal bleeding with period and abdominal pain. Pt is on the nuvaring and states has taken it out due to pain. Call transferred to front staff for an appt to be scheduled.

## 2014-12-16 ENCOUNTER — Ambulatory Visit: Payer: Medicaid Other | Admitting: Advanced Practice Midwife

## 2014-12-17 ENCOUNTER — Encounter: Payer: Self-pay | Admitting: Advanced Practice Midwife

## 2014-12-17 ENCOUNTER — Ambulatory Visit: Payer: Medicaid Other | Admitting: Advanced Practice Midwife

## 2015-02-03 ENCOUNTER — Encounter (HOSPITAL_COMMUNITY): Payer: Self-pay | Admitting: *Deleted

## 2015-02-03 ENCOUNTER — Emergency Department (HOSPITAL_COMMUNITY)
Admission: EM | Admit: 2015-02-03 | Discharge: 2015-02-04 | Disposition: A | Discharge status: Home / Self Care | Payer: Medicaid Other | Attending: Emergency Medicine | Admitting: Emergency Medicine

## 2015-02-03 DIAGNOSIS — O210 Mild hyperemesis gravidarum: Secondary | ICD-10-CM | POA: Insufficient documentation

## 2015-02-03 DIAGNOSIS — Z87440 Personal history of urinary (tract) infections: Secondary | ICD-10-CM | POA: Insufficient documentation

## 2015-02-03 DIAGNOSIS — O99519 Diseases of the respiratory system complicating pregnancy, unspecified trimester: Secondary | ICD-10-CM | POA: Insufficient documentation

## 2015-02-03 DIAGNOSIS — Z8679 Personal history of other diseases of the circulatory system: Secondary | ICD-10-CM | POA: Insufficient documentation

## 2015-02-03 DIAGNOSIS — O219 Vomiting of pregnancy, unspecified: Secondary | ICD-10-CM

## 2015-02-03 DIAGNOSIS — Z8619 Personal history of other infectious and parasitic diseases: Secondary | ICD-10-CM | POA: Diagnosis not present

## 2015-02-03 DIAGNOSIS — R000 Tachycardia, unspecified: Secondary | ICD-10-CM | POA: Diagnosis not present

## 2015-02-03 DIAGNOSIS — Z3A00 Weeks of gestation of pregnancy not specified: Secondary | ICD-10-CM | POA: Insufficient documentation

## 2015-02-03 DIAGNOSIS — J069 Acute upper respiratory infection, unspecified: Secondary | ICD-10-CM | POA: Diagnosis not present

## 2015-02-03 DIAGNOSIS — Z79899 Other long term (current) drug therapy: Secondary | ICD-10-CM | POA: Insufficient documentation

## 2015-02-03 LAB — CBC WITH DIFFERENTIAL/PLATELET
BASOS PCT: 0 % (ref 0–1)
Basophils Absolute: 0 10*3/uL (ref 0.0–0.1)
EOS ABS: 0.1 10*3/uL (ref 0.0–0.7)
EOS PCT: 2 % (ref 0–5)
HCT: 33.4 % — ABNORMAL LOW (ref 36.0–46.0)
HEMOGLOBIN: 10.9 g/dL — AB (ref 12.0–15.0)
LYMPHS ABS: 2.6 10*3/uL (ref 0.7–4.0)
Lymphocytes Relative: 36 % (ref 12–46)
MCH: 27.3 pg (ref 26.0–34.0)
MCHC: 32.6 g/dL (ref 30.0–36.0)
MCV: 83.5 fL (ref 78.0–100.0)
MONO ABS: 0.7 10*3/uL (ref 0.1–1.0)
MONOS PCT: 10 % (ref 3–12)
NEUTROS PCT: 52 % (ref 43–77)
Neutro Abs: 3.7 10*3/uL (ref 1.7–7.7)
PLATELETS: 230 10*3/uL (ref 150–400)
RBC: 4 MIL/uL (ref 3.87–5.11)
RDW: 13.3 % (ref 11.5–15.5)
WBC: 7.2 10*3/uL (ref 4.0–10.5)

## 2015-02-03 LAB — URINALYSIS, ROUTINE W REFLEX MICROSCOPIC
Bilirubin Urine: NEGATIVE
GLUCOSE, UA: NEGATIVE mg/dL
Hgb urine dipstick: NEGATIVE
Ketones, ur: NEGATIVE mg/dL
Nitrite: NEGATIVE
Protein, ur: NEGATIVE mg/dL
Specific Gravity, Urine: >1.03 — ABNORMAL HIGH (ref 1.005–1.030)
Urobilinogen, UA: 1 mg/dL (ref 0.0–1.0)
pH: 6 (ref 5.0–8.0)

## 2015-02-03 LAB — WET PREP, GENITAL
Trich, Wet Prep: NONE SEEN
YEAST WET PREP: NONE SEEN

## 2015-02-03 LAB — PREGNANCY, URINE: PREG TEST UR: POSITIVE — AB

## 2015-02-03 LAB — ABO/RH: ABO/RH(D): A NEG

## 2015-02-03 LAB — URINE MICROSCOPIC-ADD ON

## 2015-02-03 NOTE — ED Notes (Addendum)
Pt reporting nausea for aprox 1 week.  Reporting "a little" abdominal pain.

## 2015-02-03 NOTE — ED Provider Notes (Signed)
CSN: [id number]     Arrival date & time 02/03/15  2210 History   First MD Initiated Contact with Patient 02/03/15 2219     Chief Complaint  Patient presents with  . Nausea     (Consider location/radiation/quality/duration/timing/severity/associated sxs/prior Treatment) Patient is a 24 y.o. female presenting with vomiting. The history is provided by the patient.  Emesis Severity:  Mild Duration:  1 week Timing:  Intermittent Chronicity:  New Relieved by:  None tried Ineffective treatments:  None tried Associated symptoms: chills, diarrhea and sore throat   Associated symptoms: no headaches and no myalgias  Abdominal pain: cramping.    Patricia [name] is a 24 y.o. G3 P2 who presents to the ED with nausea and vomiting x 1 week. She also reports URI symptoms. LMP 01/04/15 and was normal. Patient has Nuva ring for birth control.  Past Medical History  Diagnosis Date  . Preterm labor without delivery in second trimester June 2012    Positive FFN in June  . UTI (lower urinary tract infection)   . Bacterial vaginosis   . Chlamydia   . Migraine    Past Surgical History  Procedure Laterality Date  . No past surgeries     Family History  Problem Relation Age of Onset  . Diabetes Other     great granmother  . Diabetes Paternal Grandfather   . Hypertension Paternal Grandfather   . Cancer Paternal Grandfather     prostate   History  Substance Use Topics  . Smoking status: Never Smoker   . Smokeless tobacco: Never Used  . Alcohol Use: No   OB History    Gravida Para Term Preterm AB TAB SAB Ectopic Multiple Living   [name]  Constitutional: Positive for chills. Negative for fever.  HENT: Positive for congestion, postnasal drip, rhinorrhea and sore throat. Negative for ear pain and sinus pressure.   Eyes: Negative for pain, redness and visual disturbance.  Gastrointestinal: Positive for nausea, vomiting and diarrhea. Negative for constipation.  Abdominal pain: cramping.  Genitourinary: Positive for urgency and frequency. Negative for dysuria, flank pain, vaginal bleeding, vaginal discharge and vaginal pain.  Musculoskeletal: Negative for myalgias and back pain.  Skin: Negative for rash.  Neurological: Negative for syncope and headaches.  Psychiatric/Behavioral: Negative for confusion. The patient is not nervous/anxious.       Allergies  Review of patient's allergies indicates no known allergies.  Home Medications   Prior to Admission medications   Medication Sig Start Date End Date Taking? Authorizing Provider  acetaminophen (TYLENOL) 500 MG tablet Take 1,000 mg by mouth every 6 (six) hours as needed for headache.    Historical Provider, MD  etonogestrel-ethinyl estradiol (NUVARING) 0.12-0.015 MG/24HR vaginal ring Insert vaginally and leave in place for 3 consecutive weeks, then remove for 1 week. 07/15/14   [name], CNM  promethazine (PHENERGAN) 12.5 MG tablet Take 1 tablet (12.5 mg total) by mouth every 6 (six) hours as needed for nausea or vomiting. 02/04/15   Adrienne Trombetta Orlene Och, NP   BP 115/84 mmHg  Pulse 103  Temp(Src) 99.1 F (37.3 C) (Oral)  Resp 24  Ht  (1.702 m)  Wt 329 lb (149.233 kg)  BMI 51.52 kg/m2  SpO2 100%  LMP 01/04/2015 Physical Exam  Constitutional: She is oriented to person, place, and time. She appears well-developed and well-nourished. No distress.  HENT:  Head: Normocephalic and atraumatic.  Eyes: EOM are normal.  Neck: Neck supple.  Cardiovascular: Regular rhythm.  Tachycardia present.   Pulmonary/Chest: Effort normal. She has no wheezes. She has no rales.  Abdominal: Soft. Bowel sounds are normal. There is no tenderness.  Genitourinary:  External genitalia without lesions, white d/c with scant blood vaginal vault. Cervix long, closed, no CMT, no adnexal tenderness. Unable to determine size of uterus due to patient habitus.   Musculoskeletal: Normal range of motion.   Neurological: She is alert and oriented to person, place, and time. No cranial nerve deficit.  Skin: Skin is warm and dry.  Psychiatric: She has a normal mood and affect. Her behavior is normal.  Nursing note and vitals reviewed.   ED Course  Procedures (including critical care time) Results for orders placed or performed during the hospital encounter of 02/03/15 (from the past 24 hour(s))  Urinalysis, Routine w reflex microscopic     Status: Abnormal   Collection Time: 02/03/15 10:25 PM  Result Value Ref Range   Color, Urine YELLOW YELLOW   APPearance CLEAR CLEAR   Specific Gravity, Urine >1.030 (H) 1.005 - 1.030   pH 6.0 5.0 - 8.0   Glucose, UA NEGATIVE NEGATIVE mg/dL   Hgb urine dipstick NEGATIVE NEGATIVE   Bilirubin Urine NEGATIVE NEGATIVE   Ketones, ur NEGATIVE NEGATIVE mg/dL   Protein, ur NEGATIVE NEGATIVE mg/dL   Urobilinogen, UA 1.0 0.0 - 1.0 mg/dL   Nitrite NEGATIVE NEGATIVE   Leukocytes, UA TRACE (A) NEGATIVE  Pregnancy, urine     Status: Abnormal   Collection Time: 02/03/15 10:25 PM  Result Value Ref Range   Preg Test, Ur POSITIVE (A) NEGATIVE  Urine microscopic-add on     Status: None   Collection Time: 02/03/15 10:25 PM  Result Value Ref Range   Squamous Epithelial / LPF RARE RARE   WBC, UA 0-2 <3 WBC/hpf   RBC / HPF 0-2 <3 RBC/hpf   Bacteria, UA RARE RARE  CBC with Differential     Status: Abnormal   Collection Time: 02/03/15 11:15 PM  Result Value Ref Range   WBC 7.2 4.0 - 10.5 K/uL   RBC 4.00 3.87 - 5.11 MIL/uL   Hemoglobin 10.9 (L) 12.0 - 15.0 g/dL   HCT 16.1 (L) 09.6 - 04.5 %   MCV 83.5 78.0 - 100.0 fL   MCH 27.3 26.0 - 34.0 pg   MCHC 32.6 30.0 - 36.0 g/dL   RDW 40.9 81.1 - 91.4 %   Platelets 230 150 - 400 K/uL   Neutrophils Relative % 52 43 - 77 %   Neutro Abs 3.7 1.7 - 7.7 K/uL   Lymphocytes Relative 36 12 - 46 %   Lymphs Abs 2.6 0.7 - 4.0 K/uL   Monocytes Relative 10 3 - 12 %   Monocytes Absolute 0.7 0.1 - 1.0 K/uL   Eosinophils Relative 2 0  - 5 %   Eosinophils Absolute 0.1 0.0 - 0.7 K/uL   Basophils Relative 0 0 - 1 %   Basophils Absolute 0.0 0.0 - 0.1 K/uL  hCG, quantitative, pregnancy     Status: Abnormal   Collection Time: 02/03/15 11:15 PM  Result Value Ref Range   hCG, Beta Chain, Quant, S 628 (H) <5 mIU/mL  ABO/Rh     Status: None   Collection Time: 02/03/15 11:15 PM  Result Value Ref Range   ABO/RH(D) A NEG   Wet prep, genital     Status: Abnormal   Collection Time: 02/03/15 11:29 PM  Result Value Ref Range   Yeast Wet Prep HPF POC NONE SEEN NONE SEEN   Trich, Wet Prep NONE SEEN NONE SEEN   Clue Cells Wet Prep HPF POC MODERATE (A) NONE SEEN   WBC, Wet Prep HPF POC MODERATE (A) NONE SEEN    Consult with Dr. Despina HiddenEure and he request patient follow up in the office next week. Since bleeding is scant will not do Rhogam work up [employer]. Since Bhcg is only 628 and patient without abdominal pain at this time, will not ultrasound tonight.  MDM  24 y.o. female with nausea and vomiting and frequent urination x 1 week. Positive pregnancy test tonight even though patient reports use of Nuva ring for birth control. Patient stable for d/c without abdominal pain or heavy bleeding. Discussed with the patient in detail clinical findings and plan of care. She voices understanding and agrees with plan. Ectopic precautions until follow up with Dr. Despina HiddenEure.   Final diagnoses:  Nausea and vomiting in pregnancy  URI (upper respiratory infection)      Janne NapoleonHope M Zandrea Kenealy, NP 02/04/15 0120  Rolland PorterMark James, MD 02/08/15 [phone]

## 2015-02-04 LAB — HCG, QUANTITATIVE, PREGNANCY: HCG, BETA CHAIN, QUANT, S: 628 m[IU]/mL — AB (ref <5)

## 2015-02-04 MED ORDER — PROMETHAZINE HCL 12.5 MG PO TABS: 12.5000 mg | ORAL_TABLET | Freq: Four times a day (QID) | ORAL | Status: DC | PRN

## 2015-02-04 NOTE — Discharge Instructions (Signed)
I spoke with Dr. Despina HiddenEure and he want to see you in the office next week to start your prenatal care. If you have heavy bleeding , severe pain, persistent vomiting or other problems before then, [facility]

## 2015-02-05 LAB — HIV ANTIBODY (ROUTINE TESTING W REFLEX): HIV Screen 4th Generation wRfx: NONREACTIVE

## 2015-02-05 LAB — RPR: RPR Ser Ql: NONREACTIVE

## 2015-02-05 LAB — GC/CHLAMYDIA PROBE AMP (~~LOC~~) NOT AT ARMC
Chlamydia: NEGATIVE
Neisseria Gonorrhea: NEGATIVE

## 2015-02-08 ENCOUNTER — Other Ambulatory Visit: Payer: Self-pay | Admitting: Obstetrics and Gynecology

## 2015-02-08 DIAGNOSIS — O3680X0 Pregnancy with inconclusive fetal viability, not applicable or unspecified: Secondary | ICD-10-CM

## 2015-02-09 ENCOUNTER — Ambulatory Visit (INDEPENDENT_AMBULATORY_CARE_PROVIDER_SITE_OTHER): Payer: Medicaid Other

## 2015-02-09 DIAGNOSIS — O3680X0 Pregnancy with inconclusive fetal viability, not applicable or unspecified: Secondary | ICD-10-CM

## 2015-02-09 NOTE — Progress Notes (Signed)
U/S-(5+1wks by LMP)-single IUP with +YS noted, no embryo noted on today's exam, GS meas c/w LMP dates, cx appears closed, bilateral adnexa appears WNL with C.L. Noted on LT, [facility] to confirm viability and dates

## 2015-02-11 ENCOUNTER — Encounter (HOSPITAL_COMMUNITY): Payer: Self-pay | Admitting: Emergency Medicine

## 2015-02-11 ENCOUNTER — Emergency Department (HOSPITAL_COMMUNITY)
Admission: EM | Admit: 2015-02-11 | Discharge: 2015-02-12 | Disposition: A | Discharge status: Home / Self Care | Payer: Medicaid Other | Attending: Emergency Medicine | Admitting: Emergency Medicine

## 2015-02-11 DIAGNOSIS — O212 Late vomiting of pregnancy: Secondary | ICD-10-CM | POA: Insufficient documentation

## 2015-02-11 DIAGNOSIS — O9989 Other specified diseases and conditions complicating pregnancy, childbirth and the puerperium: Secondary | ICD-10-CM | POA: Insufficient documentation

## 2015-02-11 DIAGNOSIS — O219 Vomiting of pregnancy, unspecified: Secondary | ICD-10-CM

## 2015-02-11 DIAGNOSIS — Z79899 Other long term (current) drug therapy: Secondary | ICD-10-CM | POA: Diagnosis not present

## 2015-02-11 DIAGNOSIS — Z87440 Personal history of urinary (tract) infections: Secondary | ICD-10-CM | POA: Diagnosis not present

## 2015-02-11 DIAGNOSIS — Z8619 Personal history of other infectious and parasitic diseases: Secondary | ICD-10-CM | POA: Diagnosis not present

## 2015-02-11 DIAGNOSIS — Z3A01 Less than 8 weeks gestation of pregnancy: Secondary | ICD-10-CM | POA: Diagnosis not present

## 2015-02-11 DIAGNOSIS — R197 Diarrhea, unspecified: Secondary | ICD-10-CM | POA: Insufficient documentation

## 2015-02-11 DIAGNOSIS — Z8669 Personal history of other diseases of the nervous system and sense organs: Secondary | ICD-10-CM | POA: Insufficient documentation

## 2015-02-11 MED ORDER — SODIUM CHLORIDE 0.9 % IV BOLUS (SEPSIS)
1000.0000 mL | Freq: Once | INTRAVENOUS | Status: AC
  Administered 2015-02-11: 1000 mL via INTRAVENOUS

## 2015-02-11 MED ORDER — PROMETHAZINE HCL 25 MG/ML IJ SOLN
12.5000 mg | Freq: Once | INTRAMUSCULAR | Status: AC
  Administered 2015-02-11: 12.5 mg via INTRAVENOUS
  Filled 2015-02-11: qty 1

## 2015-02-11 MED ORDER — ONDANSETRON HCL 4 MG/2ML IJ SOLN: 4.0000 mg | Freq: Once | INTRAMUSCULAR | Status: DC

## 2015-02-11 NOTE — ED Notes (Signed)
Pt c/o vomiting multiple times since noon.

## 2015-02-12 LAB — I-STAT CHEM 8, ED
BUN: 7 mg/dL (ref 6–23)
CALCIUM ION: 1.13 mmol/L (ref 1.12–1.23)
Chloride: 102 mmol/L (ref 96–112)
Creatinine, Ser: 1 mg/dL (ref 0.50–1.10)
Glucose, Bld: 84 mg/dL (ref 70–99)
HEMATOCRIT: 37 % (ref 36.0–46.0)
Hemoglobin: 12.6 g/dL (ref 12.0–15.0)
Potassium: 3.6 mmol/L (ref 3.5–5.1)
SODIUM: 137 mmol/L (ref 135–145)
TCO2: 20 mmol/L (ref 0–100)

## 2015-02-12 MED ORDER — PROMETHAZINE HCL 25 MG PO TABS
12.5000 mg | ORAL_TABLET | Freq: Four times a day (QID) | ORAL | Status: DC | PRN
Start: 2015-02-12 — End: 2015-02-12

## 2015-02-12 MED ORDER — PROMETHAZINE HCL 12.5 MG PO TABS: 12.5000 mg | ORAL_TABLET | Freq: Four times a day (QID) | ORAL | Status: DC | PRN

## 2015-02-12 NOTE — Discharge Instructions (Signed)

## 2015-02-12 NOTE — ED Provider Notes (Signed)
CSN: [id number]     Arrival date & time 02/11/15  1950 History   First MD Initiated Contact with Patient 02/11/15 2148     Chief Complaint  Patient presents with  . Emesis     (Consider location/radiation/quality/duration/timing/severity/associated sxs/prior Treatment) The history is provided by the patient.   Patricia [name] is a 24 y.o. female who is currently 5 weeks, 4 days with intrauterine pregnancy by [place] completed at [facility] 2 days ago, presenting with persistent nausea with several episodes of vomiting since noon today.  She was seen here for same symptoms 3 days ago at which time she was prescribed phenergan but has been too busy to get filled.  She reports having mild spotting when seen here 4 days ago, which has resolved.  She does endorse also several episodes of loose stools today.  She endorses generalized fatigue. She denies fevers, chills, abdominal pain, dysuria.     Past Medical History  Diagnosis Date  . Preterm labor without delivery in second trimester June 2012    Positive FFN in June  . UTI (lower urinary tract infection)   . Bacterial vaginosis   . Chlamydia   . Migraine    Past Surgical History  Procedure Laterality Date  . No past surgeries     Family History  Problem Relation Age of Onset  . Diabetes Other     great granmother  . Diabetes Paternal Grandfather   . Hypertension Paternal Grandfather   . Cancer Paternal Grandfather     prostate   History  Substance Use Topics  . Smoking status: Never Smoker   . Smokeless tobacco: Never Used  . Alcohol Use: No   OB History    Gravida Para Term Preterm AB TAB SAB Ectopic Multiple Living   [name]  Constitutional: Negative for fever and chills.  HENT: Negative for congestion and sore throat.   Eyes: Negative.   Respiratory: Negative for chest tightness and shortness of breath.   Cardiovascular: Negative for chest pain.  Gastrointestinal: Positive for nausea,  vomiting and diarrhea. Negative for abdominal pain.  Genitourinary: Negative.  Negative for vaginal bleeding and vaginal discharge.  Musculoskeletal: Negative for joint swelling, arthralgias and neck pain.  Skin: Negative.  Negative for rash and wound.  Neurological: Negative for dizziness, weakness, light-headedness, numbness and headaches.  Psychiatric/Behavioral: Negative.       Allergies  Review of patient's allergies indicates no known allergies.  Home Medications   Prior to Admission medications   Medication Sig Start Date End Date Taking? Authorizing Provider  acetaminophen (TYLENOL) 500 MG tablet Take 1,000 mg by mouth every 6 (six) hours as needed for headache.    Historical Provider, MD  etonogestrel-ethinyl estradiol (NUVARING) 0.12-0.015 MG/24HR vaginal ring Insert vaginally and leave in place for 3 consecutive weeks, then remove for 1 week. Patient not taking: Reported on 02/11/2015 07/15/14   [name], CNM  promethazine (PHENERGAN) 12.5 MG tablet Take 1-2 tablets (12.5-25 mg total) by mouth every 6 (six) hours as needed for nausea or vomiting. 02/12/15   Burgess Amor, PA-C   BP 103/71 mmHg  Pulse 96  Temp(Src) 99.1 F (37.3 C) (Oral)  Resp 18  Ht  (1.702 m)  Wt 327 lb (148.326 kg)  BMI 51.20 kg/m2  SpO2 100%  LMP 01/04/2015 Physical Exam  Constitutional: She appears well-developed and well-nourished.  Morbidly obese  HENT:  Head:  Normocephalic and atraumatic.  Eyes: Conjunctivae are normal.  Neck: Normal range of motion.  Cardiovascular: Normal rate, regular rhythm, normal heart sounds and intact distal pulses.   Pulmonary/Chest: Effort normal and breath sounds normal. She has no wheezes.  Abdominal: Soft. Bowel sounds are normal. There is no tenderness.  Genitourinary:  Deferred, completed here 3 days ago.  Musculoskeletal: Normal range of motion.  Neurological: She is alert.  Skin: Skin is warm and dry.  Psychiatric: She has a normal mood  and affect.  Nursing note and vitals reviewed.   ED Course  Procedures (including critical care time) Labs Review Labs Reviewed  I-STAT CHEM 8, ED    Imaging Review No results found.   EKG Interpretation None      MDM   Final diagnoses:  Nausea and vomiting during pregnancy    Labs reviewed from previous visit 3 days ago, istat today stable. No exam findings suggesting dehydration. However, pt was given NS IV fluids, phenergan with no emesis while here.  She was encouraged to f/u with Dr. Despina HiddenEure as scheduled (next week).  Discussed positive clue cells on prior wet prep. Asked pt to discuss with Dr.Eure for tx options at his discretion.  Pt was given new script for phenergan as she is unsure if she can locate the original.  The patient appears reasonably screened and/or stabilized for discharge and I doubt any other medical condition or other [facility] requiring further screening, evaluation, or treatment in the ED at this time prior to discharge. Pt was discussed with Dr Estell HarpinZammit prior to dispo.    Burgess AmorJulie Shawnee Higham, PA-C 02/12/15 1238  Bethann BerkshireJoseph Zammit, MD 02/15/15 [phone]

## 2015-02-15 ENCOUNTER — Other Ambulatory Visit: Payer: Self-pay | Admitting: Obstetrics & Gynecology

## 2015-02-15 DIAGNOSIS — O3680X0 Pregnancy with inconclusive fetal viability, not applicable or unspecified: Secondary | ICD-10-CM

## 2015-02-16 ENCOUNTER — Ambulatory Visit (INDEPENDENT_AMBULATORY_CARE_PROVIDER_SITE_OTHER): Payer: Medicaid Other

## 2015-02-16 DIAGNOSIS — O3680X0 Pregnancy with inconclusive fetal viability, not applicable or unspecified: Secondary | ICD-10-CM

## 2015-02-16 NOTE — Progress Notes (Addendum)
US 6145w1d Iup pos fht 122bpm,normal ov's

## 2015-02-23 ENCOUNTER — Encounter: Payer: Self-pay | Admitting: Women's Health

## 2015-02-23 ENCOUNTER — Ambulatory Visit (INDEPENDENT_AMBULATORY_CARE_PROVIDER_SITE_OTHER): Payer: Medicaid Other | Admitting: Women's Health

## 2015-02-23 VITALS — BP 120/76 | HR 88 | Wt 306.5 lb

## 2015-02-23 DIAGNOSIS — Z8632 Personal history of gestational diabetes: Secondary | ICD-10-CM

## 2015-02-23 DIAGNOSIS — O09211 Supervision of pregnancy with history of pre-term labor, first trimester: Secondary | ICD-10-CM

## 2015-02-23 DIAGNOSIS — Z3491 Encounter for supervision of normal pregnancy, unspecified, first trimester: Secondary | ICD-10-CM

## 2015-02-23 DIAGNOSIS — O09891 Supervision of other high risk pregnancies, first trimester: Secondary | ICD-10-CM

## 2015-02-23 DIAGNOSIS — Z1389 Encounter for screening for other disorder: Secondary | ICD-10-CM

## 2015-02-23 DIAGNOSIS — O09299 Supervision of pregnancy with other poor reproductive or obstetric history, unspecified trimester: Secondary | ICD-10-CM | POA: Insufficient documentation

## 2015-02-23 DIAGNOSIS — Z3A01 Less than 8 weeks gestation of pregnancy: Secondary | ICD-10-CM | POA: Diagnosis not present

## 2015-02-23 DIAGNOSIS — O26899 Other specified pregnancy related conditions, unspecified trimester: Secondary | ICD-10-CM | POA: Insufficient documentation

## 2015-02-23 DIAGNOSIS — O360111 Maternal care for anti-D [Rh] antibodies, first trimester, fetus 1: Secondary | ICD-10-CM

## 2015-02-23 DIAGNOSIS — Z3481 Encounter for supervision of other normal pregnancy, first trimester: Secondary | ICD-10-CM

## 2015-02-23 DIAGNOSIS — Z331 Pregnant state, incidental: Secondary | ICD-10-CM

## 2015-02-23 DIAGNOSIS — Z3682 Encounter for antenatal screening for nuchal translucency: Secondary | ICD-10-CM

## 2015-02-23 DIAGNOSIS — O09219 Supervision of pregnancy with history of pre-term labor, unspecified trimester: Secondary | ICD-10-CM

## 2015-02-23 DIAGNOSIS — Z6791 Unspecified blood type, Rh negative: Secondary | ICD-10-CM | POA: Insufficient documentation

## 2015-02-23 DIAGNOSIS — O09291 Supervision of pregnancy with other poor reproductive or obstetric history, first trimester: Secondary | ICD-10-CM

## 2015-02-23 DIAGNOSIS — Z3490 Encounter for supervision of normal pregnancy, unspecified, unspecified trimester: Secondary | ICD-10-CM | POA: Insufficient documentation

## 2015-02-23 DIAGNOSIS — O09899 Supervision of other high risk pregnancies, unspecified trimester: Secondary | ICD-10-CM | POA: Insufficient documentation

## 2015-02-23 DIAGNOSIS — Z369 Encounter for antenatal screening, unspecified: Secondary | ICD-10-CM

## 2015-02-23 LAB — POCT URINALYSIS DIPSTICK
Glucose, UA: NEGATIVE
Ketones, UA: NEGATIVE
LEUKOCYTES UA: NEGATIVE
Nitrite, UA: NEGATIVE
PROTEIN UA: NEGATIVE
RBC UA: NEGATIVE

## 2015-02-23 MED ORDER — CONCEPT DHA 53.5-38-1 MG PO CAPS: 1.0000 | ORAL_CAPSULE | Freq: Every day | ORAL | Status: DC

## 2015-02-23 MED ORDER — DOXYLAMINE-PYRIDOXINE 10-10 MG PO TBEC: DELAYED_RELEASE_TABLET | ORAL | Status: DC

## 2015-02-23 NOTE — Progress Notes (Signed)
Pt given CCNC from and lab consents to read over and sign. Pt states that she is concerned about a preterm delivery again, wants to discuss.

## 2015-02-23 NOTE — Patient Instructions (Signed)
You will have your sugar test next visit.  Please do not eat or drink anything after midnight the night before you come, not even water.  You will be here for at least two hours.     Nausea & Vomiting  Have saltine crackers or pretzels by your bed and eat a few bites before you raise your head out of bed in the morning  Eat small frequent meals throughout the day instead of large meals  Drink plenty of fluids throughout the day to stay hydrated, just don't drink a lot of fluids with your meals.  This can make your stomach fill up faster making you feel sick  Do not brush your teeth right after you eat  Products with real ginger are good for nausea, like ginger ale and ginger hard candy Make sure it says made with real ginger!  Sucking on sour candy like lemon heads is also good for nausea  If your prenatal vitamins make you nauseated, take them at night so you will sleep through the nausea  Sea Bands  If you feel like you need medicine for the nausea & vomiting please let us know  If you are unable to keep any fluids or food down please let us know    First Trimester of Pregnancy The first trimester of pregnancy is from week 1 until the end of week 12 (months 1 through 3). A week after a sperm fertilizes an egg, the egg will implant on the wall of the uterus. This embryo will begin to develop into a baby. Genes from you and your partner are forming the baby. The female genes determine whether the baby is a boy or a girl. At 6-8 weeks, the eyes and face are formed, and the heartbeat can be seen on ultrasound. At the end of 12 weeks, all the baby's organs are formed.  Now that you are pregnant, you will want to do everything you can to have a healthy baby. Two of the most important things are to get good prenatal care and to follow your health care provider's instructions. Prenatal care is all the medical care you receive before the baby's birth. This care will help prevent, find, and treat  any problems during the pregnancy and childbirth. BODY CHANGES Your body goes through many changes during pregnancy. The changes vary from woman to woman.   You may gain or lose a couple of pounds at first.  You may feel sick to your stomach (nauseous) and throw up (vomit). If the vomiting is uncontrollable, call your health care provider.  You may tire easily.  You may develop headaches that can be relieved by medicines approved by your health care provider.  You may urinate more often. Painful urination may mean you have a bladder infection.  You may develop heartburn as a result of your pregnancy.  You may develop constipation because certain hormones are causing the muscles that push waste through your intestines to slow down.  You may develop hemorrhoids or swollen, bulging veins (varicose veins).  Your breasts may begin to grow larger and become tender. Your nipples may stick out more, and the tissue that surrounds them (areola) may become darker.  Your gums may bleed and may be sensitive to brushing and flossing.  Dark spots or blotches (chloasma, mask of pregnancy) may develop on your face. This will likely fade after the baby is born.  Your menstrual periods will stop.  You may have a loss of appetite.    You may develop cravings for certain kinds of food.  You may have changes in your emotions from day to day, such as being excited to be pregnant or being concerned that something may go wrong with the pregnancy and baby.  You may have more vivid and strange dreams.  You may have changes in your hair. These can include thickening of your hair, rapid growth, and changes in texture. Some women also have hair loss during or after pregnancy, or hair that feels dry or thin. Your hair will most likely return to normal after your baby is born. WHAT TO EXPECT AT YOUR PRENATAL VISITS During a routine prenatal visit:  You will be weighed to make sure you and the baby are growing  normally.  Your blood pressure will be taken.  Your abdomen will be measured to track your baby's growth.  The fetal heartbeat will be listened to starting around week 10 or 12 of your pregnancy.  Test results from any previous visits will be discussed. Your health care provider may ask you:  How you are feeling.  If you are feeling the baby move.  If you have had any abnormal symptoms, such as leaking fluid, bleeding, severe headaches, or abdominal cramping.  If you have any questions. Other tests that may be performed during your first trimester include:  Blood tests to find your blood type and to check for the presence of any previous infections. They will also be used to check for low iron levels (anemia) and Rh antibodies. Later in the pregnancy, blood tests for diabetes will be done along with other tests if problems develop.  Urine tests to check for infections, diabetes, or protein in the urine.  An ultrasound to confirm the proper growth and development of the baby.  An amniocentesis to check for possible genetic problems.  Fetal screens for spina bifida and Down syndrome.  You may need other tests to make sure you and the baby are doing well. HOME CARE INSTRUCTIONS  Medicines  Follow your health care provider's instructions regarding medicine use. Specific medicines may be either safe or unsafe to take during pregnancy.  Take your prenatal vitamins as directed.  If you develop constipation, try taking a stool softener if your health care provider approves. Diet  Eat regular, well-balanced meals. Choose a variety of foods, such as meat or vegetable-based protein, fish, milk and low-fat dairy products, vegetables, fruits, and whole grain breads and cereals. Your health care provider will help you determine the amount of weight gain that is right for you.  Avoid raw meat and uncooked cheese. These carry germs that can cause birth defects in the baby.  Eating four  or five small meals rather than three large meals a day may help relieve nausea and vomiting. If you start to feel nauseous, eating a few soda crackers can be helpful. Drinking liquids between meals instead of during meals also seems to help nausea and vomiting.  If you develop constipation, eat more high-fiber foods, such as fresh vegetables or fruit and whole grains. Drink enough fluids to keep your urine clear or pale yellow. Activity and Exercise  Exercise only as directed by your health care provider. Exercising will help you:  Control your weight.  Stay in shape.  Be prepared for labor and delivery.  Experiencing pain or cramping in the lower abdomen or low back is a good sign that you should stop exercising. Check with your health care provider before continuing normal exercises.    Try to avoid standing for long periods of time. Move your legs often if you must stand in one place for a long time.  Avoid heavy lifting.  Wear low-heeled shoes, and practice good posture.  You may continue to have sex unless your health care provider directs you otherwise. Relief of Pain or Discomfort  Wear a good support bra for breast tenderness.   Take warm sitz baths to soothe any pain or discomfort caused by hemorrhoids. Use hemorrhoid cream if your health care provider approves.   Rest with your legs elevated if you have leg cramps or low back pain.  If you develop varicose veins in your legs, wear support hose. Elevate your feet for 15 minutes, 3-4 times a day. Limit salt in your diet. Prenatal Care  Schedule your prenatal visits by the twelfth week of pregnancy. They are usually scheduled monthly at first, then more often in the last 2 months before delivery.  Write down your questions. Take them to your prenatal visits.  Keep all your prenatal visits as directed by your health care provider. Safety  Wear your seat belt at all times when driving.  Make a list of emergency phone  numbers, including numbers for family, friends, the hospital, and police and fire departments. General Tips  Ask your health care provider for a referral to a local prenatal education class. Begin classes no later than at the beginning of month 6 of your pregnancy.  Ask for help if you have counseling or nutritional needs during pregnancy. Your health care provider can offer advice or refer you to specialists for help with various needs.  Do not use hot tubs, steam rooms, or saunas.  Do not douche or use tampons or scented sanitary pads.  Do not cross your legs for long periods of time.  Avoid cat litter boxes and soil used by cats. These carry germs that can cause birth defects in the baby and possibly loss of the fetus by miscarriage or stillbirth.  Avoid all smoking, herbs, alcohol, and medicines not prescribed by your health care provider. Chemicals in these affect the formation and growth of the baby.  Schedule a dentist appointment. At home, brush your teeth with a soft toothbrush and be gentle when you floss. SEEK MEDICAL CARE IF:   You have dizziness.  You have mild pelvic cramps, pelvic pressure, or nagging pain in the abdominal area.  You have persistent nausea, vomiting, or diarrhea.  You have a bad smelling vaginal discharge.  You have pain with urination.  You notice increased swelling in your face, hands, legs, or ankles. SEEK IMMEDIATE MEDICAL CARE IF:   You have a fever.  You are leaking fluid from your vagina.  You have spotting or bleeding from your vagina.  You have severe abdominal cramping or pain.  You have rapid weight gain or loss.  You vomit blood or material that looks like coffee grounds.  You are exposed to German measles and have never had them.  You are exposed to fifth disease or chickenpox.  You develop a severe headache.  You have shortness of breath.  You have any kind of trauma, such as from a fall or a car accident. Document  Released: 11/07/2001 Document Revised: 03/30/2014 Document Reviewed: 09/23/2013 [employer] Patient Information 2015 [employer]. This information is not intended to replace advice given to you by your health care provider. Make sure you discuss any questions you have with your health care provider.   

## 2015-02-23 NOTE — Progress Notes (Signed)
  Subjective:  Patricia [name] is a [age] y.o. 733P1102 African American female at 8664w1d by LMP c/w 5wk u/s, being seen today for her first obstetrical visit.  Her obstetrical history is significant for obesity, h/o A1DM, h/o PTB @ 35.3wks d/t PPROM, rh neg. Conceived on nuva ring.  Pregnancy history fully reviewed.  Patient reports nausea- requests meds. Denies vb, cramping, uti s/s, abnormal/malodorous vag d/c, or vulvovaginal itching/irritation.  BP 120/76 mmHg  Pulse 88  Wt 306 lb 8 oz (139.027 kg)  LMP 01/04/2015  HISTORY: OB History  Gravida Para Term Preterm AB SAB TAB Ectopic Multiple Living  3 2 1 1      2     # Outcome Date GA Lbr Len/2nd Weight Sex Delivery Anes PTL Lv  3 Current           2 Preterm 04/20/13 6121w3d 10:57 / 00:03 6 lb 1 oz (2.75 kg) Genella MechM Vag-Spont EPI  Y  1 Term 07/31/11 4460w4d 21:47 / 00:46 8 lb (3.63 kg) M Vag-Spont EPI  Y     Past Medical History  Diagnosis Date  . Preterm labor without delivery in second trimester June 2012    Positive FFN in June  . UTI (lower urinary tract infection)   . Bacterial vaginosis   . Chlamydia   . Migraine    Past Surgical History  Procedure Laterality Date  . No past surgeries     Family History  Problem Relation Age of Onset  . Diabetes Other     great granmother  . Diabetes Paternal Grandfather   . Hypertension Paternal Grandfather   . Cancer Paternal Grandfather     prostate    Exam   System:     General: Well developed & nourished, no acute distress   Skin: Warm & dry, normal coloration and turgor, no rashes   Neurologic: Alert & oriented, normal mood   Cardiovascular: Regular rate & rhythm   Respiratory: Effort & rate normal, LCTAB, acyanotic   Abdomen: Soft, non tender   Extremities: normal strength, tone  Thin prep pap smear neg 2015  FHR: 150s  via informal transabdominal u/s   Assessment:   Pregnancy: [id number] Patient Active Problem List   Diagnosis Date Noted  . Supervision of normal pregnancy  02/23/2015    Priority: High  . H/O preterm delivery, currently pregnant 02/23/2015    Priority: High  . Contraceptive management 05/29/2013  . Morbid obesity with BMI of 45.0-49.9, adult 04/01/2013    5864w1d G3P1102 New OB visit Obesity H/O A1DM H/O PTB @ 35.3wks d/t PPROM Nausea of pregnancy Rh neg  Plan:  Initial labs drawn Rx prenatal vitamins to begin today Problem list reviewed and updated Reviewed n/v relief measures and warning s/s to report Rx diclegis, prior auth sent, and no samples to give.  Reviewed recommended weight gain based on pre-gravid BMI Encouraged well-balanced diet Genetic Screening discussed Integrated Screen: requested Cystic fibrosis screening discussed neg prev preg Ultrasound discussed; fetal survey: requested Follow up in 1 weeks for early 2hr gtt d/t h/o A1DM (no visit- can do PN1 labs at same time), then 4wks for visit and 1st it/tn CCNC completed Declines flu shot [employer] Makena, accepted, ordered today  Marge DuncansBooker, Kimberly Randall CNM, [facility] SystemWHNP-BC 02/23/2015 9:57 AM

## 2015-02-25 LAB — URINE CULTURE

## 2015-03-04 ENCOUNTER — Other Ambulatory Visit: Payer: Medicaid Other

## 2015-03-05 LAB — GLUCOSE TOLERANCE, 2 HOURS W/ 1HR
GLUCOSE, 1 HOUR: 87 mg/dL (ref 65–179)
Glucose, 2 hour: 99 mg/dL (ref 65–152)
Glucose, Fasting: 85 mg/dL (ref 65–91)

## 2015-03-06 LAB — PMP SCREEN PROFILE (10S), URINE
Amphetamine Screen, Ur: NEGATIVE ng/mL
BENZODIAZEPINE SCREEN, URINE: NEGATIVE ng/mL
Barbiturate Screen, Ur: NEGATIVE ng/mL
CANNABINOIDS UR QL SCN: NEGATIVE ng/mL
CREATININE(CRT), U: 163.3 mg/dL (ref 20.0–300.0)
Cocaine(Metab.)Screen, Urine: NEGATIVE ng/mL
Methadone Scn, Ur: NEGATIVE ng/mL
OPIATE SCRN UR: NEGATIVE ng/mL
OXYCODONE+OXYMORPHONE UR QL SCN: NEGATIVE ng/mL
PCP Scrn, Ur: NEGATIVE ng/mL
PROPOXYPHENE SCREEN: NEGATIVE ng/mL
Ph of Urine: 7.5 (ref 4.5–8.9)

## 2015-03-06 LAB — SICKLE CELL SCREEN: Sickle Cell Screen: NEGATIVE

## 2015-03-06 LAB — URINALYSIS, ROUTINE W REFLEX MICROSCOPIC
Bilirubin, UA: NEGATIVE
GLUCOSE, UA: NEGATIVE
KETONES UA: NEGATIVE
LEUKOCYTES UA: NEGATIVE
NITRITE UA: NEGATIVE
PROTEIN UA: NEGATIVE
RBC UA: NEGATIVE
Specific Gravity, UA: 1.018 (ref 1.005–1.030)
Urobilinogen, Ur: 1 mg/dL (ref 0.2–1.0)
pH, UA: 6.5 (ref 5.0–7.5)

## 2015-03-06 LAB — ANTIBODY SCREEN: ANTIBODY SCREEN: NEGATIVE

## 2015-03-06 LAB — RUBELLA SCREEN: Rubella Antibodies, IGG: 7.71 index (ref >0.99)

## 2015-03-06 LAB — HEPATITIS B SURFACE ANTIGEN: Hepatitis B Surface Ag: NEGATIVE

## 2015-03-08 ENCOUNTER — Telehealth: Payer: Self-pay | Admitting: Women's Health

## 2015-03-08 NOTE — Telephone Encounter (Signed)
LMOM of normal lab results.

## 2015-03-09 ENCOUNTER — Telehealth: Payer: Self-pay | Admitting: Adult Health

## 2015-03-09 NOTE — Telephone Encounter (Signed)
Spoke with pt letting her know all labs were normal. Pt voiced understanding. JSY 

## 2015-03-11 ENCOUNTER — Telehealth: Payer: Self-pay | Admitting: *Deleted

## 2015-03-11 ENCOUNTER — Telehealth: Payer: Self-pay | Admitting: Advanced Practice Midwife

## 2015-03-11 NOTE — Telephone Encounter (Signed)
Pt c/o cold, cough, throat irritated, headache. Pt informed to gargle with warm salt water to help relieve sore throat, push fluids, can take tylenol. If no improvement call our office back. Pt verbalized understanding.

## 2015-03-11 NOTE — Telephone Encounter (Signed)
Pt called back and states not feeling well at all, c/o cough, cold, no fever. Call transferred to front staff for soonest available appt.

## 2015-03-12 ENCOUNTER — Ambulatory Visit (INDEPENDENT_AMBULATORY_CARE_PROVIDER_SITE_OTHER): Payer: Medicaid Other | Admitting: Obstetrics and Gynecology

## 2015-03-12 ENCOUNTER — Encounter: Payer: Self-pay | Admitting: Obstetrics and Gynecology

## 2015-03-12 VITALS — BP 120/70 | HR 88 | Wt 306.0 lb

## 2015-03-12 DIAGNOSIS — Z331 Pregnant state, incidental: Secondary | ICD-10-CM | POA: Diagnosis not present

## 2015-03-12 DIAGNOSIS — Z369 Encounter for antenatal screening, unspecified: Secondary | ICD-10-CM

## 2015-03-12 DIAGNOSIS — J069 Acute upper respiratory infection, unspecified: Secondary | ICD-10-CM

## 2015-03-12 DIAGNOSIS — R05 Cough: Secondary | ICD-10-CM | POA: Diagnosis not present

## 2015-03-12 DIAGNOSIS — Z3A09 9 weeks gestation of pregnancy: Secondary | ICD-10-CM

## 2015-03-12 MED ORDER — GUAIFENESIN 200 MG PO TABS: 200.0000 mg | ORAL_TABLET | ORAL | Status: DC | PRN

## 2015-03-12 MED ORDER — HYDROCODONE-HOMATROPINE 5-1.5 MG/5ML PO SYRP: 5.0000 mL | ORAL_SOLUTION | Freq: Four times a day (QID) | ORAL | Status: DC | PRN

## 2015-03-12 NOTE — Progress Notes (Signed)
Patient ID: Patricia [name], female   DOB: 10-Nov-1991, 24 y.o.   MRN: [id number] workin appt for URI sx. [id number] 3541w4d Estimated Date of Delivery: 10/11/15  Blood pressure 120/70, pulse 88, weight 306 lb (138.801 kg), last menstrual period 01/04/2015, not currently breastfeeding.   refer to the ob flow sheet for FH and FHR, also BP, Wt, Urine results:notable for n/a  Patient reports too early for fetal movement, denies any bleeding and no rupture of membranes symptoms or regular contractions. Patient complaints: Patient complains of stuffy nose, sneezing, and cough that has been ongoing for a few days. She however denies any fever.  Per note, we are going to perform a GC/CHL this visit.   Patient also reports controlled DM in her 1st and 2nd pregnancy with diet and increased water intake.  She states she has done 2 U/S's for this pregnancy, with the next due on 03/24/15.  Patient states she had a lot of vaginal discharge during her second pregnancy.   Questions were answered. Assessment: Pregnancy 7341w4d Plan:  Continued routine obstetrical care, Rx Hycodan for cough  F/u in 1.6 weeks for NT Rx hycodan and guaifenesin      This chart was SCRIBED for Patricia BachJohn Dorlene Footman, MD by Patricia [name], ED Scribe. This patient was seen in room 1 and the patient's care was started at 11:44 AM.   I personally performed the services described in this documentation, which was SCRIBED in my presence. The recorded information has been reviewed and considered accurate. It has been edited as necessary during review. Patricia BurrowFERGUSON,Patricia Pennisi V, MD

## 2015-03-12 NOTE — Progress Notes (Signed)
Pt states that she is having a lot of pressure under her breast at the top of her stomach. Pt stuffy nose, sneezing, cough for a few days. Pt denies any fever.

## 2015-03-15 ENCOUNTER — Telehealth: Payer: Self-pay | Admitting: Women's Health

## 2015-03-15 NOTE — Telephone Encounter (Signed)
Pt informed to call back in no improvement, worsens or bleeding develops, pt verbalized understanding.

## 2015-03-15 NOTE — Telephone Encounter (Signed)
Pt states she is about 10w pregnant and is having bad abdominal pain and pressure.  She has been taking Tylenol with no relief, she denies any vaginal bleeding.  Pt states the had problems with preterm labor in both her other pregnancies and is concerned.  Advised her to push fluids, rest with possible and use Tylenol.  Informed pt I would route a note to a provider and call her back with any other recommendations.  Pt verbalized understanding.

## 2015-03-16 ENCOUNTER — Telehealth: Payer: Self-pay | Admitting: Women's Health

## 2015-03-16 ENCOUNTER — Telehealth: Payer: Self-pay | Admitting: *Deleted

## 2015-03-16 ENCOUNTER — Ambulatory Visit (INDEPENDENT_AMBULATORY_CARE_PROVIDER_SITE_OTHER): Payer: Medicaid Other | Admitting: Obstetrics and Gynecology

## 2015-03-16 ENCOUNTER — Encounter: Payer: Self-pay | Admitting: Obstetrics and Gynecology

## 2015-03-16 VITALS — BP 110/72 | HR 84 | Temp 98.6°F | Wt 303.0 lb

## 2015-03-16 DIAGNOSIS — Z331 Pregnant state, incidental: Secondary | ICD-10-CM

## 2015-03-16 DIAGNOSIS — Z3A10 10 weeks gestation of pregnancy: Secondary | ICD-10-CM

## 2015-03-16 DIAGNOSIS — Z1389 Encounter for screening for other disorder: Secondary | ICD-10-CM

## 2015-03-16 DIAGNOSIS — J069 Acute upper respiratory infection, unspecified: Secondary | ICD-10-CM

## 2015-03-16 LAB — POCT URINALYSIS DIPSTICK
GLUCOSE UA: NEGATIVE
Ketones, UA: NEGATIVE
Leukocytes, UA: NEGATIVE
NITRITE UA: NEGATIVE
Protein, UA: NEGATIVE
RBC UA: NEGATIVE

## 2015-03-16 LAB — GC/CHLAMYDIA PROBE AMP
Chlamydia trachomatis, NAA: NEGATIVE
NEISSERIA GONORRHOEAE BY PCR: NEGATIVE

## 2015-03-16 MED ORDER — HYDROCODONE-HOMATROPINE 5-1.5 MG/5ML PO SYRP: 5.0000 mL | ORAL_SOLUTION | Freq: Four times a day (QID) | ORAL | Status: DC | PRN

## 2015-03-16 NOTE — Telephone Encounter (Signed)
Pt requesting a note for work for sickness since 03/12/2015. Note left at front desk for pick up.

## 2015-03-16 NOTE — Progress Notes (Signed)
Workin APPT: due to chest cold.and cough G3P1102 272w1d Estimated Date of Delivery: 10/11/15  Blood pressure 110/72, pulse 84, temperature 98.6 F (37 C), weight 303 lb (137.44 kg), last menstrual period 01/04/2015, not currently breastfeeding.   refer to the ob flow sheet for FH and FHR, also BP, Wt, Urine results:notable for negative  Patient reports  Dry nonproductive cough, no fever. No sob. Patient complaints:muscle soreness from coughing. Chest clear.tm's clear Questions were answered. Assessment: URI, viral bronchitis. Plan:  Continued routine obstetrical care, Rx hycodan, Mucinex.  F/u in prn weeks for routine prenatal

## 2015-03-16 NOTE — Progress Notes (Signed)
Pt worked in today for cough, vomiting, achy all over, headache since Friday.

## 2015-03-16 NOTE — Telephone Encounter (Signed)
Pt c/o chills, body aches, headaches, abdominal cramping with vomiting, coughing, "chest hurting due to coughing and vomiting so much." Per [name] Booker, CNM push fluids, rest, continue meds as prescribed. Offered pt an appt, pt states will continue [name] recommendation and if not better will call to be seen. Pt informed also by [name] Booker, CNM to late for the Tamuflu and to soon for antibiotics. Pt verbalized understanding.

## 2015-03-19 ENCOUNTER — Other Ambulatory Visit: Payer: Self-pay | Admitting: Women's Health

## 2015-03-19 DIAGNOSIS — Z3682 Encounter for antenatal screening for nuchal translucency: Secondary | ICD-10-CM

## 2015-03-24 ENCOUNTER — Other Ambulatory Visit: Payer: Self-pay | Admitting: Women's Health

## 2015-03-24 ENCOUNTER — Ambulatory Visit (INDEPENDENT_AMBULATORY_CARE_PROVIDER_SITE_OTHER): Payer: Medicaid Other | Admitting: Advanced Practice Midwife

## 2015-03-24 ENCOUNTER — Ambulatory Visit (INDEPENDENT_AMBULATORY_CARE_PROVIDER_SITE_OTHER): Payer: Medicaid Other

## 2015-03-24 VITALS — BP 104/60 | HR 84 | Wt 304.0 lb

## 2015-03-24 DIAGNOSIS — Z3481 Encounter for supervision of other normal pregnancy, first trimester: Secondary | ICD-10-CM

## 2015-03-24 DIAGNOSIS — O09211 Supervision of pregnancy with history of pre-term labor, first trimester: Secondary | ICD-10-CM | POA: Diagnosis not present

## 2015-03-24 DIAGNOSIS — Z36 Encounter for antenatal screening of mother: Secondary | ICD-10-CM

## 2015-03-24 DIAGNOSIS — Z3682 Encounter for antenatal screening for nuchal translucency: Secondary | ICD-10-CM

## 2015-03-24 DIAGNOSIS — Z3491 Encounter for supervision of normal pregnancy, unspecified, first trimester: Secondary | ICD-10-CM

## 2015-03-24 DIAGNOSIS — Z331 Pregnant state, incidental: Secondary | ICD-10-CM | POA: Diagnosis not present

## 2015-03-24 DIAGNOSIS — Z3A11 11 weeks gestation of pregnancy: Secondary | ICD-10-CM

## 2015-03-24 DIAGNOSIS — Z1389 Encounter for screening for other disorder: Secondary | ICD-10-CM | POA: Diagnosis not present

## 2015-03-24 LAB — POCT URINALYSIS DIPSTICK
GLUCOSE UA: NEGATIVE
Leukocytes, UA: NEGATIVE
NITRITE UA: NEGATIVE
Protein, UA: NEGATIVE
RBC UA: NEGATIVE

## 2015-03-24 NOTE — Progress Notes (Signed)
[id number] 573w2d Estimated Date of Delivery: 10/11/15  Blood pressure 104/60, pulse 84, weight 304 lb (137.893 kg), last menstrual period 01/04/2015, not currently breastfeeding.   BP weight and urine results all reviewed and noted.  Please refer to the obstetrical flow sheet for the fundal height and fetal heart rate documentation: US could not measure NT (too small)  Patient  denies any bleeding and no rupture of membranes symptoms or regular contractions. Patient is without complaints. All questions were answered.  Plan:  Continued routine obstetrical care,   Follow up in 1-2 weeks for OB appointment, NT/IT

## 2015-03-24 NOTE — Progress Notes (Signed)
US  For NT [redacted]W[redacted]D CRL 5.2cm c/w dates,fht 158bpm,normal ov's bilat,unable to see NT,pt will return next week for a f/u scan.

## 2015-03-30 ENCOUNTER — Other Ambulatory Visit: Payer: Self-pay | Admitting: Obstetrics & Gynecology

## 2015-03-30 DIAGNOSIS — Z3682 Encounter for antenatal screening for nuchal translucency: Secondary | ICD-10-CM

## 2015-04-01 ENCOUNTER — Ambulatory Visit (INDEPENDENT_AMBULATORY_CARE_PROVIDER_SITE_OTHER): Payer: Medicaid Other

## 2015-04-01 ENCOUNTER — Ambulatory Visit (INDEPENDENT_AMBULATORY_CARE_PROVIDER_SITE_OTHER): Payer: Medicaid Other | Admitting: Advanced Practice Midwife

## 2015-04-01 ENCOUNTER — Encounter: Payer: Self-pay | Admitting: Advanced Practice Midwife

## 2015-04-01 VITALS — BP 110/70 | HR 72 | Wt 300.4 lb

## 2015-04-01 DIAGNOSIS — Z3481 Encounter for supervision of other normal pregnancy, first trimester: Secondary | ICD-10-CM | POA: Diagnosis not present

## 2015-04-01 DIAGNOSIS — O09211 Supervision of pregnancy with history of pre-term labor, first trimester: Secondary | ICD-10-CM | POA: Diagnosis not present

## 2015-04-01 DIAGNOSIS — Z3491 Encounter for supervision of normal pregnancy, unspecified, first trimester: Secondary | ICD-10-CM

## 2015-04-01 DIAGNOSIS — Z36 Encounter for antenatal screening of mother: Secondary | ICD-10-CM

## 2015-04-01 DIAGNOSIS — O2391 Unspecified genitourinary tract infection in pregnancy, first trimester: Secondary | ICD-10-CM | POA: Diagnosis not present

## 2015-04-01 DIAGNOSIS — Z1389 Encounter for screening for other disorder: Secondary | ICD-10-CM

## 2015-04-01 DIAGNOSIS — Z3682 Encounter for antenatal screening for nuchal translucency: Secondary | ICD-10-CM

## 2015-04-01 DIAGNOSIS — Z363 Encounter for antenatal screening for malformations: Secondary | ICD-10-CM

## 2015-04-01 DIAGNOSIS — Z331 Pregnant state, incidental: Secondary | ICD-10-CM

## 2015-04-01 DIAGNOSIS — Z3A12 12 weeks gestation of pregnancy: Secondary | ICD-10-CM | POA: Diagnosis not present

## 2015-04-01 DIAGNOSIS — R809 Proteinuria, unspecified: Secondary | ICD-10-CM

## 2015-04-01 LAB — POCT URINALYSIS DIPSTICK
GLUCOSE UA: NEGATIVE
Ketones, UA: NEGATIVE
Nitrite, UA: NEGATIVE

## 2015-04-01 MED ORDER — NITROFURANTOIN MONOHYD MACRO 100 MG PO CAPS: 100.0000 mg | ORAL_CAPSULE | Freq: Two times a day (BID) | ORAL | Status: AC

## 2015-04-01 NOTE — Progress Notes (Signed)
[id number] 6024w3d Estimated Date of Delivery: 10/11/15  Blood pressure 110/70, pulse 72, weight 300 lb 6.4 oz (136.261 kg), last menstrual period 01/04/2015, not currently breastfeeding.   BP weight and urine results all reviewed and noted. C/O UTI sx  Please refer to the obstetrical flow sheet for the fundal height and fetal heart rate documentation: NT/IT today:  normal  Patient denies any bleeding and no rupture of membranes symptoms or regular contractions.  Patient is without complaints. All questions were answered.  Plan:  Continued routine obstetrical care, culture urine.  Rx Macrobid 100mg  BID X 7 Follow up in 4 weeks for OB appointment, 2nd IT

## 2015-04-01 NOTE — Patient Instructions (Signed)
Costochondritis Costochondritis, sometimes called Tietze syndrome, is a swelling and irritation (inflammation) of the tissue (cartilage) that connects your ribs with your breastbone (sternum). It causes pain in the chest and rib area. Costochondritis usually goes away on its own over time. It can take up to 6 weeks or longer to get better, especially if you are unable to limit your activities. CAUSES  Some cases of costochondritis have no known cause. Possible causes include:  Injury (trauma).  Exercise or activity such as lifting.  Severe coughing. SIGNS AND SYMPTOMS  Pain and tenderness in the chest and rib area.  Pain that gets worse when coughing or taking deep breaths.  Pain that gets worse with specific movements. DIAGNOSIS  Your health care provider will do a physical exam and ask about your symptoms. Chest X-rays or other tests may be done to rule out other problems. TREATMENT  Costochondritis usually goes away on its own over time. Your health care provider may prescribe medicine to help relieve pain. HOME CARE INSTRUCTIONS   Avoid exhausting physical activity. Try not to strain your ribs during normal activity. This would include any activities using chest, abdominal, and side muscles, especially if heavy weights are used.  Apply ice to the affected area for the first 2 days after the pain begins.  Put ice in a plastic bag.  Place a towel between your skin and the bag.  Leave the ice on for 20 minutes, 2-3 times a day.  Only take over-the-counter or prescription medicines as directed by your health care provider. SEEK MEDICAL CARE IF:  You have redness or swelling at the rib joints. These are signs of infection.  Your pain does not go away despite rest or medicine. SEEK IMMEDIATE MEDICAL CARE IF:   Your pain increases or you are very uncomfortable.  You have shortness of breath or difficulty breathing.  You cough up blood.  You have worse chest pains,  sweating, or vomiting.  You have a fever or persistent symptoms for more than 2-3 days.  You have a fever and your symptoms suddenly get worse. MAKE SURE YOU:   Understand these instructions.  Will watch your condition.  Will get help right away if you are not doing well or get worse. Document Released: 08/23/2005 Document Revised: 09/03/2013 Document Reviewed: 06/17/2013 ExitCare Patient Information 2015 [employer]. This information is not intended to replace advice given to you by your health care provider. Make sure you discuss any questions you have with your health care provider.  

## 2015-04-01 NOTE — Progress Notes (Signed)
US NT/IT, single iup  7355w3d c/w dates,crl 65.545mm,pos fht 170bpm,normal ov's bilat, nasal bone present,nt 1.8467mm

## 2015-04-01 NOTE — Addendum Note (Signed)
Addended by: Criss AlvinePULLIAM, CHRYSTAL G on: 04/01/2015 03:51 PM   Modules accepted: Orders

## 2015-04-01 NOTE — Addendum Note (Signed)
Addended by: Jacklyn ShellRESENZO-DISHMON, Conswella Bruney on: 04/01/2015 03:14 PM   Modules accepted: Orders, Level of Service

## 2015-04-03 LAB — MATERNAL SCREEN, INTEGRATED #1
Crown Rump Length: 65.5 mm
GEST. AGE ON COLLECTION DATE: 12.7 wk
MATERNAL AGE AT EDD: 23.2 a
Nuchal Translucency (NT): 1.7 mm
Number of Fetuses: 1
PAPP-A Value: 951.2 ng/mL
WEIGHT: 300 [lb_av]

## 2015-04-05 LAB — URINE CULTURE

## 2015-04-07 ENCOUNTER — Other Ambulatory Visit: Payer: Self-pay | Admitting: Advanced Practice Midwife

## 2015-04-07 ENCOUNTER — Telehealth: Payer: Self-pay | Admitting: *Deleted

## 2015-04-07 MED ORDER — AMOXICILLIN-POT CLAVULANATE 875-125 MG PO TABS: 1.0000 | ORAL_TABLET | Freq: Two times a day (BID) | ORAL | Status: DC

## 2015-04-07 NOTE — Progress Notes (Signed)
UTI resistant to macrobid.  Rx augmentin.LM for pt

## 2015-04-07 NOTE — Telephone Encounter (Signed)
Spoke with pt letting her know Drenda FreezeFran changed Macrobid to Augmentin due to urine culture. Pt voiced understanding. JSY

## 2015-04-20 ENCOUNTER — Encounter: Payer: Self-pay | Admitting: Women's Health

## 2015-04-27 ENCOUNTER — Other Ambulatory Visit: Payer: Medicaid Other

## 2015-04-27 ENCOUNTER — Ambulatory Visit (INDEPENDENT_AMBULATORY_CARE_PROVIDER_SITE_OTHER): Payer: Medicaid Other | Admitting: Women's Health

## 2015-04-27 ENCOUNTER — Encounter: Payer: Self-pay | Admitting: Women's Health

## 2015-04-27 VITALS — BP 106/68 | HR 84 | Wt 298.0 lb

## 2015-04-27 DIAGNOSIS — O09212 Supervision of pregnancy with history of pre-term labor, second trimester: Secondary | ICD-10-CM

## 2015-04-27 DIAGNOSIS — Z3482 Encounter for supervision of other normal pregnancy, second trimester: Secondary | ICD-10-CM | POA: Diagnosis not present

## 2015-04-27 DIAGNOSIS — Z3A16 16 weeks gestation of pregnancy: Secondary | ICD-10-CM

## 2015-04-27 DIAGNOSIS — O09892 Supervision of other high risk pregnancies, second trimester: Secondary | ICD-10-CM

## 2015-04-27 DIAGNOSIS — E669 Obesity, unspecified: Secondary | ICD-10-CM

## 2015-04-27 DIAGNOSIS — Z3492 Encounter for supervision of normal pregnancy, unspecified, second trimester: Secondary | ICD-10-CM

## 2015-04-27 DIAGNOSIS — Z3682 Encounter for antenatal screening for nuchal translucency: Secondary | ICD-10-CM

## 2015-04-27 DIAGNOSIS — Z1389 Encounter for screening for other disorder: Secondary | ICD-10-CM

## 2015-04-27 DIAGNOSIS — Z331 Pregnant state, incidental: Secondary | ICD-10-CM

## 2015-04-27 DIAGNOSIS — Z363 Encounter for antenatal screening for malformations: Secondary | ICD-10-CM

## 2015-04-27 MED ORDER — HYDROXYPROGESTERONE CAPROATE 250 MG/ML IM OIL
250.0000 mg | TOPICAL_OIL | Freq: Once | INTRAMUSCULAR | Status: AC
  Administered 2015-04-27: 250 mg via INTRAMUSCULAR

## 2015-04-27 NOTE — Progress Notes (Signed)
Low-risk OB appointment [id number] 2671w1d Estimated Date of Delivery: 10/11/15 BP 106/68 mmHg  Pulse 84  Wt 298 lb (135.172 kg)  LMP 01/04/2015  BP, weight reviewed. Voided right before she came, unable to void again. Refer to obstetrical flow sheet for FH & FHR.  No fm yet. Denies cramping, lof, vb, or uti s/s. No complaints. Reviewed warning s/s to report. Plan:  Continue routine obstetrical care  F/U in 4wks for OB appointment and anatomy u/s, weekly for Makena (began today), used office stock as her vial is still not in that was ordered 3/29- refaxed order sheet today 2nd IT today

## 2015-04-27 NOTE — Patient Instructions (Signed)
Second Trimester of Pregnancy The second trimester is from week 13 through week 28, months 4 through 6. The second trimester is often a time when you feel your best. Your body has also adjusted to being pregnant, and you begin to feel better physically. Usually, morning sickness has lessened or quit completely, you may have more energy, and you may have an increase in appetite. The second trimester is also a time when the fetus is growing rapidly. At the end of the sixth month, the fetus is about 9 inches long and weighs about 1 pounds. You will likely begin to feel the baby move (quickening) between 18 and 20 weeks of the pregnancy. BODY CHANGES Your body goes through many changes during pregnancy. The changes vary from woman to woman.   Your weight will continue to increase. You will notice your lower abdomen bulging out.  You may begin to get stretch marks on your hips, abdomen, and breasts.  You may develop headaches that can be relieved by medicines approved by your health care provider.  You may urinate more often because the fetus is pressing on your bladder.  You may develop or continue to have heartburn as a result of your pregnancy.  You may develop constipation because certain hormones are causing the muscles that push waste through your intestines to slow down.  You may develop hemorrhoids or swollen, bulging veins (varicose veins).  You may have back pain because of the weight gain and pregnancy hormones relaxing your joints between the bones in your pelvis and as a result of a shift in weight and the muscles that support your balance.  Your breasts will continue to grow and be tender.  Your gums may bleed and may be sensitive to brushing and flossing.  Dark spots or blotches (chloasma, mask of pregnancy) may develop on your face. This will likely fade after the baby is born.  A dark line from your belly button to the pubic area (linea nigra) may appear. This will likely fade  after the baby is born.  You may have changes in your hair. These can include thickening of your hair, rapid growth, and changes in texture. Some women also have hair loss during or after pregnancy, or hair that feels dry or thin. Your hair will most likely return to normal after your baby is born. WHAT TO EXPECT AT YOUR PRENATAL VISITS During a routine prenatal visit:  You will be weighed to make sure you and the fetus are growing normally.  Your blood pressure will be taken.  Your abdomen will be measured to track your baby's growth.  The fetal heartbeat will be listened to.  Any test results from the previous visit will be discussed. Your health care provider may ask you:  How you are feeling.  If you are feeling the baby move.  If you have had any abnormal symptoms, such as leaking fluid, bleeding, severe headaches, or abdominal cramping.  If you have any questions. Other tests that may be performed during your second trimester include:  Blood tests that check for:  Low iron levels (anemia).  Gestational diabetes (between 24 and 28 weeks).  Rh antibodies.  Urine tests to check for infections, diabetes, or protein in the urine.  An ultrasound to confirm the proper growth and development of the baby.  An amniocentesis to check for possible genetic problems.  Fetal screens for spina bifida and Down syndrome. HOME CARE INSTRUCTIONS   Avoid all smoking, herbs, alcohol, and unprescribed   drugs. These chemicals affect the formation and growth of the baby.  Follow your health care provider's instructions regarding medicine use. There are medicines that are either safe or unsafe to take during pregnancy.  Exercise only as directed by your health care provider. Experiencing uterine cramps is a good sign to stop exercising.  Continue to eat regular, healthy meals.  Wear a good support bra for breast tenderness.  Do not use hot tubs, steam rooms, or saunas.  Wear your  seat belt at all times when driving.  Avoid raw meat, uncooked cheese, cat litter boxes, and soil used by cats. These carry germs that can cause birth defects in the baby.  Take your prenatal vitamins.  Try taking a stool softener (if your health care provider approves) if you develop constipation. Eat more high-fiber foods, such as fresh vegetables or fruit and whole grains. Drink plenty of fluids to keep your urine clear or pale yellow.  Take warm sitz baths to soothe any pain or discomfort caused by hemorrhoids. Use hemorrhoid cream if your health care provider approves.  If you develop varicose veins, wear support hose. Elevate your feet for 15 minutes, 3-4 times a day. Limit salt in your diet.  Avoid heavy lifting, wear low heel shoes, and practice good posture.  Rest with your legs elevated if you have leg cramps or low back pain.  Visit your dentist if you have not gone yet during your pregnancy. Use a soft toothbrush to brush your teeth and be gentle when you floss.  A sexual relationship may be continued unless your health care provider directs you otherwise.  Continue to go to all your prenatal visits as directed by your health care provider. SEEK MEDICAL CARE IF:   You have dizziness.  You have mild pelvic cramps, pelvic pressure, or nagging pain in the abdominal area.  You have persistent nausea, vomiting, or diarrhea.  You have a bad smelling vaginal discharge.  You have pain with urination. SEEK IMMEDIATE MEDICAL CARE IF:   You have a fever.  You are leaking fluid from your vagina.  You have spotting or bleeding from your vagina.  You have severe abdominal cramping or pain.  You have rapid weight gain or loss.  You have shortness of breath with chest pain.  You notice sudden or extreme swelling of your face, hands, ankles, feet, or legs.  You have not felt your baby move in over an hour.  You have severe headaches that do not go away with  medicine.  You have vision changes. Document Released: 11/07/2001 Document Revised: 11/18/2013 Document Reviewed: 01/14/2013 Exit[employer] Patient Information 2015 [employer]. This information is not intended to replace advice given to you by your health care provider. Make sure you discuss any questions you have with your health care provider.  

## 2015-04-29 ENCOUNTER — Encounter: Payer: Medicaid Other | Admitting: Advanced Practice Midwife

## 2015-04-29 LAB — MATERNAL SCREEN, INTEGRATED #2
AFP MoM: 1.8
Alpha-Fetoprotein: 34.3 ng/mL
Crown Rump Length: 65.5 mm
DIA MOM: 1.64
DIA Value: 203.6 pg/mL
Estriol, Unconjugated: 0.56 ng/mL
GESTATIONAL AGE: 16.4 wk
Gest. Age on Collection Date: 12.7 weeks
HCG VALUE: 60.5 [IU]/mL
Maternal Age at EDD: 23.2 years
Nuchal Translucency (NT): 1.7 mm
Nuchal Translucency MoM: 1.18
Number of Fetuses: 1
PAPP-A MoM: 2.14
PAPP-A Value: 951.2 ng/mL
Test Results:: NEGATIVE
Weight: 300 [lb_av]
Weight: 300 [lb_av]
hCG MoM: 3.41
uE3 MoM: 0.78

## 2015-04-30 ENCOUNTER — Encounter (HOSPITAL_COMMUNITY): Payer: Self-pay | Admitting: Emergency Medicine

## 2015-04-30 ENCOUNTER — Emergency Department (HOSPITAL_COMMUNITY)
Admission: EM | Admit: 2015-04-30 | Discharge: 2015-04-30 | Disposition: A | Discharge status: Home / Self Care | Payer: Medicaid Other | Attending: Emergency Medicine | Admitting: Emergency Medicine

## 2015-04-30 DIAGNOSIS — Z8619 Personal history of other infectious and parasitic diseases: Secondary | ICD-10-CM | POA: Insufficient documentation

## 2015-04-30 DIAGNOSIS — Z79899 Other long term (current) drug therapy: Secondary | ICD-10-CM | POA: Insufficient documentation

## 2015-04-30 DIAGNOSIS — Z3490 Encounter for supervision of normal pregnancy, unspecified, unspecified trimester: Secondary | ICD-10-CM

## 2015-04-30 DIAGNOSIS — Z87440 Personal history of urinary (tract) infections: Secondary | ICD-10-CM | POA: Diagnosis not present

## 2015-04-30 DIAGNOSIS — Z8679 Personal history of other diseases of the circulatory system: Secondary | ICD-10-CM | POA: Diagnosis not present

## 2015-04-30 DIAGNOSIS — R1084 Generalized abdominal pain: Secondary | ICD-10-CM | POA: Insufficient documentation

## 2015-04-30 DIAGNOSIS — Z792 Long term (current) use of antibiotics: Secondary | ICD-10-CM | POA: Insufficient documentation

## 2015-04-30 DIAGNOSIS — R1030 Lower abdominal pain, unspecified: Secondary | ICD-10-CM

## 2015-04-30 DIAGNOSIS — O9989 Other specified diseases and conditions complicating pregnancy, childbirth and the puerperium: Secondary | ICD-10-CM | POA: Diagnosis present

## 2015-04-30 DIAGNOSIS — Z3A16 16 weeks gestation of pregnancy: Secondary | ICD-10-CM | POA: Insufficient documentation

## 2015-04-30 LAB — URINALYSIS, ROUTINE W REFLEX MICROSCOPIC
Bilirubin Urine: NEGATIVE
Glucose, UA: NEGATIVE mg/dL
Hgb urine dipstick: NEGATIVE
Ketones, ur: NEGATIVE mg/dL
LEUKOCYTES UA: NEGATIVE
Nitrite: NEGATIVE
PH: 6.5 (ref 5.0–8.0)
PROTEIN: NEGATIVE mg/dL
SPECIFIC GRAVITY, URINE: 1.015 (ref 1.005–1.030)
UROBILINOGEN UA: 0.2 mg/dL (ref 0.0–1.0)

## 2015-04-30 MED ORDER — ACETAMINOPHEN 325 MG PO TABS
650.0000 mg | ORAL_TABLET | Freq: Once | ORAL | Status: AC
Start: 2015-04-30 — End: 2015-04-30
  Administered 2015-04-30: 650 mg via ORAL
  Filled 2015-04-30: qty 2

## 2015-04-30 NOTE — ED Provider Notes (Signed)
CSN: [id number]     Arrival date & time 04/30/15  2011 History  This chart was scribed for Blane Ohara, MD by Roxy Cedar, ED Scribe. This patient was seen in room APA09/APA09 and the patient's care was started at 9:05 PM.   Chief Complaint  Patient presents with  . Abdominal Pain   Patient is a 23 y.o. female presenting with abdominal pain. The history is provided by the patient. No language interpreter was used.  Abdominal Pain Pain location:  Generalized Pain radiates to:  Back Pain severity:  Moderate Onset quality:  Gradual Associated symptoms: no chills, no fever, no nausea, no vaginal bleeding, no vaginal discharge and no vomiting    HPI Comments: Patricia [name] is a 106 week pregnant, 24 y.o. female, G77P2A1, with a PMHx of UTI, preterm labor without delivery in second trimester, chlamydia, bacterial vaginosis, who presents to the Emergency Department complaining of moderate, gradually worsenning abdominal pain onset 2 weeks ago with associated lower back pain. She states she feels "really tight and it feels like braxton hick and contraction pain". Patient was given P17 shot to prevent from going into preterm labor on 04/27/15.  She denies associated vaginal bleeding, vaginal discharge, fevers, chills, nausea, vomiting. Patient states that she went into labor 22-24 weeks early with her first child with no delivery. Her second child was born 8 weeks early. Patient's ob/gyn is [facility].  Past Medical History  Diagnosis Date  . Preterm labor without delivery in second trimester June 2012    Positive FFN in June  . UTI (lower urinary tract infection)   . Bacterial vaginosis   . Chlamydia   . Migraine    Past Surgical History  Procedure Laterality Date  . No past surgeries     Family History  Problem Relation Age of Onset  . Diabetes Other     great granmother  . Diabetes Paternal Grandfather   . Hypertension Paternal Grandfather   . Cancer Paternal Grandfather     prostate    History  Substance Use Topics  . Smoking status: Never Smoker   . Smokeless tobacco: Never Used  . Alcohol Use: No   OB History    Gravida Para Term Preterm AB TAB SAB Ectopic Multiple Living   [name]  Constitutional: Negative for fever and chills.  Gastrointestinal: Positive for abdominal pain. Negative for nausea and vomiting.  Genitourinary: Negative for vaginal bleeding and vaginal discharge.  All other systems reviewed and are negative.  Allergies  Review of patient's allergies indicates no known allergies.  Home Medications   Prior to Admission medications   Medication Sig Start Date End Date Taking? Authorizing Provider  acetaminophen (TYLENOL) 500 MG tablet Take 1,000 mg by mouth every 6 (six) hours as needed for headache.    Historical Provider, MD  amoxicillin-clavulanate (AUGMENTIN) 875-125 MG per tablet Take 1 tablet by mouth 2 (two) times daily. Patient not taking: Reported on 04/27/2015 04/07/15   [name], CNM  Prenat-FeFum-FePo-FA-Omega 3 (CONCEPT DHA) 53.5-38-1 MG CAPS Take 1 capsule by mouth daily. 02/23/15   Cheral Marker, CNM   Triage Vitals: BP 117/69 mmHg  Pulse 86  Temp(Src) 99 F (37.2 C) (Oral)  Resp 16  Ht  (1.702 m)  Wt 290 lb (131.543 kg)  BMI 45.41 kg/m2  SpO2 100%  LMP 01/04/2015  Physical Exam  Constitutional: She is oriented to person, place,  and time. She appears well-developed and well-nourished. No distress.  HENT:  Head: Normocephalic and atraumatic.  Eyes: Conjunctivae and EOM are normal.  Neck: Neck supple. No tracheal deviation present.  Cardiovascular: Normal rate.   Pulmonary/Chest: Effort normal. No respiratory distress.  Musculoskeletal: Normal range of motion.  Neurological: She is alert and oriented to person, place, and time.  Skin: Skin is warm and dry.  Psychiatric: She has a normal mood and affect. Her behavior is normal.  Nursing note and vitals reviewed.  ED  Course  Procedures (including critical care time) EMERGENCY DEPARTMENT US PREGNANCY "Study: Limited Ultrasound of the Pelvis for Pregnancy"  INDICATIONS:Pregnancy(required) Multiple views of the uterus and pelvic cavity were obtained in real-time with a multi-frequency probe.  APPROACH:Transabdominal   PERFORMED BY: Myself  IMAGES ARCHIVED?: Yes  LIMITATIONS: Body habitus  PREGNANCY FREE FLUID: None  PREGNANCY FINDINGS: Fetal heart activity seen  INTERPRETATION: Viable intrauterine pregnancy  GESTATIONAL AGE, ESTIMATE: 16.5 wks  FETAL HEART RATE: nl  CPT Codes:  16109-6076815-26 (transabdominal OB)  768-26-52 (transvaginal OB, Reduced level of service for incomplete exam)     DIAGNOSTIC STUDIES: Oxygen Saturation is 100% on RA, normal by my interpretation.    COORDINATION OF CARE: 9:10 PM- Discussed plans to order diagnostic urinalysis and other fluids. Pt advised of plan for treatment and pt agrees.   Labs Review Labs Reviewed - No data to display  Imaging Review No results found.   EKG Interpretation None     MDM   Final diagnoses:  Pregnancy  Lower abdominal pain   Patient presents with bilateral lower abdominal discomfort, history of preterm labor. Bedside ultrasound showed normal heart rate good fetal movement. Patient has no vaginal symptoms. Urinalysis reviewed unremarkable. Discussed with OB/GYN physician on call recommended cervix check and they will follow her closely on Monday. Cervix closed on exam.  Results and differential diagnosis were discussed with the patient/parent/guardian. Close follow up outpatient was discussed, comfortable with the plan.   Medications  acetaminophen (TYLENOL) tablet 650 mg (650 mg Oral Given 04/30/15 2223)    Filed Vitals:   04/30/15 2047  BP: 117/69  Pulse: 86  Temp: 99 F (37.2 C)  TempSrc: Oral  Resp: 16  Height: 5\' 7"  (1.702 m)  Weight: 290 lb (131.543 kg)  SpO2: 100%    Final diagnoses:  Pregnancy   Lower abdominal pain      Blane OharaJoshua Armin Yerger, MD 04/30/15 [phone]

## 2015-04-30 NOTE — ED Notes (Signed)
MD at the bedside with U/S,  Good FHT  Rate 167 and confirmed 16week fetus.

## 2015-04-30 NOTE — ED Notes (Addendum)
Patient is [redacted] weeks pregnant, reports diffuse abdominal pain x approximately 2 weeks. States saw midwife on Tuesday for same and received shot to prevent preterm labor. Complains of worsening abdominal pain since. Denies vaginal discharge or bleeding.

## 2015-04-30 NOTE — ED Notes (Signed)
Patient given discharge instruction, verbalized understand. IV removed, band aid applied. Patient ambulatory out of the department.  

## 2015-04-30 NOTE — Discharge Instructions (Signed)
Stay well-hydrated. [facility] or the ER if you develop regular contractions, vaginal bleeding or vaginal discharge or focal abdominal pain.  If you were given medicines take as directed.  If you are on coumadin or contraceptives realize their levels and effectiveness is altered by many different medicines.  If you have any reaction (rash, tongues swelling, other) to the medicines stop taking and see a physician.    If your blood pressure was elevated in the ER make sure you follow up for management with a primary doctor or return for chest pain, shortness of breath or stroke symptoms.  Please follow up as directed and return to the ER or see a physician for new or worsening symptoms.  Thank you. Filed Vitals:   04/30/15 2047  BP: 117/69  Pulse: 86  Temp: 99 F (37.2 C)  TempSrc: Oral  Resp: 16  Height: 5\' 7"  (1.702 m)  Weight: 290 lb (131.543 kg)  SpO2: 100%

## 2015-05-02 LAB — URINE CULTURE: Colony Count: 8000

## 2015-05-03 ENCOUNTER — Encounter: Payer: Self-pay | Admitting: Obstetrics & Gynecology

## 2015-05-03 ENCOUNTER — Ambulatory Visit (INDEPENDENT_AMBULATORY_CARE_PROVIDER_SITE_OTHER): Payer: Medicaid Other | Admitting: Obstetrics & Gynecology

## 2015-05-03 VITALS — BP 110/70 | HR 72 | Wt 300.0 lb

## 2015-05-03 DIAGNOSIS — R102 Pelvic and perineal pain: Secondary | ICD-10-CM

## 2015-05-03 DIAGNOSIS — Z1389 Encounter for screening for other disorder: Secondary | ICD-10-CM | POA: Diagnosis not present

## 2015-05-03 DIAGNOSIS — Z3482 Encounter for supervision of other normal pregnancy, second trimester: Secondary | ICD-10-CM

## 2015-05-03 DIAGNOSIS — Z331 Pregnant state, incidental: Secondary | ICD-10-CM

## 2015-05-03 DIAGNOSIS — Z3A17 17 weeks gestation of pregnancy: Secondary | ICD-10-CM | POA: Diagnosis not present

## 2015-05-03 DIAGNOSIS — O09212 Supervision of pregnancy with history of pre-term labor, second trimester: Secondary | ICD-10-CM | POA: Diagnosis not present

## 2015-05-03 DIAGNOSIS — O09892 Supervision of other high risk pregnancies, second trimester: Secondary | ICD-10-CM

## 2015-05-03 DIAGNOSIS — N949 Unspecified condition associated with female genital organs and menstrual cycle: Secondary | ICD-10-CM

## 2015-05-03 LAB — POCT URINALYSIS DIPSTICK
Blood, UA: NEGATIVE
Glucose, UA: NEGATIVE
LEUKOCYTES UA: NEGATIVE
Nitrite, UA: NEGATIVE
PROTEIN UA: NEGATIVE

## 2015-05-03 MED ORDER — HYDROXYPROGESTERONE CAPROATE 250 MG/ML IM OIL
250.0000 mg | TOPICAL_OIL | Freq: Once | INTRAMUSCULAR | Status: AC
  Administered 2015-05-03: 250 mg via INTRAMUSCULAR

## 2015-05-03 NOTE — Addendum Note (Signed)
Addended by: Colen DarlingYOUNG, Steven Veazie S on: 05/03/2015 03:45 PM   Modules accepted: Orders

## 2015-05-03 NOTE — Progress Notes (Signed)
Work in  [employer]  Had round ligament pain  [facility] No bleeding  FHR 145 FH @umbilicus   Blood pressure 110/70, pulse 72, weight 300 lb (136.079 kg), last menstrual period 01/04/2015.    Will go ahead and give her the 17P so she does not have to come tomorrow Keep weekly injection appts

## 2015-05-04 ENCOUNTER — Ambulatory Visit: Payer: Medicaid Other

## 2015-05-11 ENCOUNTER — Ambulatory Visit (INDEPENDENT_AMBULATORY_CARE_PROVIDER_SITE_OTHER): Payer: Medicaid Other | Admitting: Women's Health

## 2015-05-11 ENCOUNTER — Ambulatory Visit (INDEPENDENT_AMBULATORY_CARE_PROVIDER_SITE_OTHER): Payer: Medicaid Other | Admitting: *Deleted

## 2015-05-11 ENCOUNTER — Encounter: Payer: Self-pay | Admitting: Women's Health

## 2015-05-11 ENCOUNTER — Encounter: Payer: Medicaid Other | Admitting: Advanced Practice Midwife

## 2015-05-11 ENCOUNTER — Ambulatory Visit: Payer: Medicaid Other

## 2015-05-11 ENCOUNTER — Encounter: Payer: Self-pay | Admitting: *Deleted

## 2015-05-11 ENCOUNTER — Other Ambulatory Visit: Payer: Medicaid Other

## 2015-05-11 VITALS — BP 122/60 | HR 92 | Ht 67.0 in | Wt 297.5 lb

## 2015-05-11 VITALS — BP 122/60 | HR 92 | Wt 297.5 lb

## 2015-05-11 DIAGNOSIS — O09212 Supervision of pregnancy with history of pre-term labor, second trimester: Secondary | ICD-10-CM

## 2015-05-11 DIAGNOSIS — O219 Vomiting of pregnancy, unspecified: Secondary | ICD-10-CM | POA: Diagnosis not present

## 2015-05-11 DIAGNOSIS — O2342 Unspecified infection of urinary tract in pregnancy, second trimester: Secondary | ICD-10-CM | POA: Diagnosis not present

## 2015-05-11 DIAGNOSIS — O09892 Supervision of other high risk pregnancies, second trimester: Secondary | ICD-10-CM

## 2015-05-11 DIAGNOSIS — Z1389 Encounter for screening for other disorder: Secondary | ICD-10-CM

## 2015-05-11 DIAGNOSIS — Z3A18 18 weeks gestation of pregnancy: Secondary | ICD-10-CM

## 2015-05-11 DIAGNOSIS — O26892 Other specified pregnancy related conditions, second trimester: Secondary | ICD-10-CM | POA: Diagnosis not present

## 2015-05-11 DIAGNOSIS — Z331 Pregnant state, incidental: Secondary | ICD-10-CM

## 2015-05-11 DIAGNOSIS — O2343 Unspecified infection of urinary tract in pregnancy, third trimester: Secondary | ICD-10-CM | POA: Insufficient documentation

## 2015-05-11 DIAGNOSIS — Z3492 Encounter for supervision of normal pregnancy, unspecified, second trimester: Secondary | ICD-10-CM

## 2015-05-11 DIAGNOSIS — R51 Headache: Secondary | ICD-10-CM

## 2015-05-11 LAB — POCT URINALYSIS DIPSTICK
Glucose, UA: NEGATIVE
Nitrite, UA: POSITIVE
PROTEIN UA: NEGATIVE

## 2015-05-11 MED ORDER — DOXYLAMINE-PYRIDOXINE 10-10 MG PO TBEC: DELAYED_RELEASE_TABLET | ORAL | Status: DC

## 2015-05-11 MED ORDER — NITROFURANTOIN MONOHYD MACRO 100 MG PO CAPS: 100.0000 mg | ORAL_CAPSULE | Freq: Two times a day (BID) | ORAL | Status: DC

## 2015-05-11 MED ORDER — HYDROXYPROGESTERONE CAPROATE 250 MG/ML IM OIL
250.0000 mg | TOPICAL_OIL | Freq: Once | INTRAMUSCULAR | Status: AC
  Administered 2015-05-11: 250 mg via INTRAMUSCULAR

## 2015-05-11 NOTE — Progress Notes (Signed)
Pt here for 17P. Reports having urinary frequency and odor with urine. Having pain and pressure. I spoke with Selena Batten, CNM and she will see pt. Return in 1 week for next 17P. JSY

## 2015-05-11 NOTE — Patient Instructions (Signed)
For Headaches:   Stay well hydrated, drink enough water so that your urine is clear, sometimes if you are dehydrated you can get headaches  Eat small frequent meals and snacks, sometimes if you are hungry you can get headaches  Sometimes you get headaches during pregnancy from the pregnancy hormones  You can try tylenol (1-2 regular strength 325mg  or 1-2 extra strength 500mg ) as directed on the box. The least amount of medication that works is best.   Cool compresses (cool wet washcloth or ice pack) to area of head that is hurting  You can also try drinking a caffeinated drink to see if this will help  Call us if these things aren't helping your headaches   For Dizzy Spells:   This is usually related to either your blood sugar or your blood pressure dropping  Make sure you are staying well hydrated and drinking enough water so that your urine is clear  Eat small frequent meals and snacks containing protein (meat, eggs, nuts, cheese) so that your blood sugar doesn't drop  If you do get dizzy, sit/lay down and get you something to drink and a snack containing protein- you will usually start feeling better in 10-20 minutes   Nausea & Vomiting  Have saltine crackers or pretzels by your bed and eat a few bites before you raise your head out of bed in the morning  Eat small frequent meals throughout the day instead of large meals  Drink plenty of fluids throughout the day to stay hydrated, just don't drink a lot of fluids with your meals.  This can make your stomach fill up faster making you feel sick  Do not brush your teeth right after you eat  Products with real ginger are good for nausea, like ginger ale and ginger hard candy Make sure it says made with real ginger!  Sucking on sour candy like lemon heads is also good for nausea  If your prenatal vitamins make you nauseated, take them at night so you will sleep through the nausea  Sea Bands  If you feel like you need  medicine for the nausea & vomiting please let us know  If you are unable to keep any fluids or food down please let us know

## 2015-05-11 NOTE — Progress Notes (Signed)
Work-in Low-risk OB appointment [id number] [redacted]w[redacted]d Estimated Date of Delivery: 10/11/15 BP 122/60 mmHg  Pulse 92  Wt 297 lb 8 oz (134.945 kg)  LMP 01/04/2015  BP, weight, and urine reviewed.  Refer to obstetrical flow sheet for FH & FHR.  No FM yet.  Denies regular uc's, lof, vb. Here for 17P, +nitrates in urine and urinary frequency/dysuria. Rx macrobid and send urine for cx.  Thinks 17P is making her abdomen hurt, n/v. Describes as sometime cramping, sometimes generalized abd pain. Not eating well, only ice water/popsicles. Some n/v. Headaches, taking 3 extra-strength apap q 4-6hrs. Notified her that was too much and can cause liver damage. Describes ha's as frontal, will come and go on own throughout day. Likely r/t poor diet. To increase protein in diet, rx diclegis for n/v, increase fluids, take apap only as directed on bottle if needed, continue 17P- sx do not sound r/t medication.  Reviewed warning s/s to report. Plan:  Continue routine obstetrical care  F/U in 1wk for 17P and then on 6/28 as scheduled for OB appointment and anatomy u/s

## 2015-05-13 LAB — URINE CULTURE

## 2015-05-18 ENCOUNTER — Encounter: Payer: Medicaid Other | Admitting: *Deleted

## 2015-05-19 ENCOUNTER — Encounter: Payer: Self-pay | Admitting: *Deleted

## 2015-05-19 ENCOUNTER — Ambulatory Visit (INDEPENDENT_AMBULATORY_CARE_PROVIDER_SITE_OTHER): Payer: Medicaid Other | Admitting: *Deleted

## 2015-05-19 VITALS — BP 100/60 | HR 100 | Ht 67.0 in | Wt 297.5 lb

## 2015-05-19 DIAGNOSIS — Z3492 Encounter for supervision of normal pregnancy, unspecified, second trimester: Secondary | ICD-10-CM

## 2015-05-19 DIAGNOSIS — O09892 Supervision of other high risk pregnancies, second trimester: Secondary | ICD-10-CM

## 2015-05-19 DIAGNOSIS — O09212 Supervision of pregnancy with history of pre-term labor, second trimester: Secondary | ICD-10-CM

## 2015-05-19 DIAGNOSIS — Z331 Pregnant state, incidental: Secondary | ICD-10-CM

## 2015-05-19 DIAGNOSIS — Z1389 Encounter for screening for other disorder: Secondary | ICD-10-CM

## 2015-05-19 LAB — POCT URINALYSIS DIPSTICK
GLUCOSE UA: NEGATIVE
Ketones, UA: NEGATIVE
Leukocytes, UA: NEGATIVE
NITRITE UA: NEGATIVE
Protein, UA: NEGATIVE
RBC UA: NEGATIVE

## 2015-05-19 MED ORDER — HYDROXYPROGESTERONE CAPROATE 250 MG/ML IM OIL
250.0000 mg | TOPICAL_OIL | Freq: Once | INTRAMUSCULAR | Status: AC
  Administered 2015-05-19: 250 mg via INTRAMUSCULAR

## 2015-05-19 NOTE — Progress Notes (Signed)
Pt here for 17P. Reports no problems at this time. Return in 1 week for next shot. JSY 

## 2015-05-20 ENCOUNTER — Other Ambulatory Visit: Payer: Medicaid Other

## 2015-05-20 ENCOUNTER — Encounter: Payer: Medicaid Other | Admitting: Advanced Practice Midwife

## 2015-05-25 ENCOUNTER — Encounter: Payer: Medicaid Other | Admitting: Women's Health

## 2015-05-25 ENCOUNTER — Other Ambulatory Visit: Payer: Medicaid Other

## 2015-05-26 ENCOUNTER — Ambulatory Visit (INDEPENDENT_AMBULATORY_CARE_PROVIDER_SITE_OTHER): Payer: Medicaid Other | Admitting: *Deleted

## 2015-05-26 ENCOUNTER — Encounter: Payer: Self-pay | Admitting: *Deleted

## 2015-05-26 VITALS — BP 110/58 | HR 92 | Ht 67.0 in | Wt 299.5 lb

## 2015-05-26 DIAGNOSIS — Z331 Pregnant state, incidental: Secondary | ICD-10-CM | POA: Diagnosis not present

## 2015-05-26 DIAGNOSIS — Z1389 Encounter for screening for other disorder: Secondary | ICD-10-CM | POA: Diagnosis not present

## 2015-05-26 DIAGNOSIS — O09212 Supervision of pregnancy with history of pre-term labor, second trimester: Secondary | ICD-10-CM

## 2015-05-26 DIAGNOSIS — Z3492 Encounter for supervision of normal pregnancy, unspecified, second trimester: Secondary | ICD-10-CM

## 2015-05-26 DIAGNOSIS — O09892 Supervision of other high risk pregnancies, second trimester: Secondary | ICD-10-CM

## 2015-05-26 LAB — POCT URINALYSIS DIPSTICK
Glucose, UA: NEGATIVE
NITRITE UA: NEGATIVE
PROTEIN UA: NEGATIVE

## 2015-05-26 MED ORDER — HYDROXYPROGESTERONE CAPROATE 250 MG/ML IM OIL
250.0000 mg | TOPICAL_OIL | Freq: Once | INTRAMUSCULAR | Status: AC
  Administered 2015-05-26: 250 mg via INTRAMUSCULAR

## 2015-05-26 NOTE — Progress Notes (Signed)
Pt here for 17P. Reports no problems at this time. Return in 1 week for next shot. JSY 

## 2015-05-27 ENCOUNTER — Telehealth: Payer: Self-pay | Admitting: *Deleted

## 2015-05-27 NOTE — Telephone Encounter (Signed)
Spoke with CueroJohnetta with [facility]. Pt's hemoglobin today was 10.1. Pt is taking her prenatal vit daily. I spoke with Drenda FreezeFran, CNM and she advised to add OTC iron daily. Pt aware and voiced understanding. JSY

## 2015-06-01 ENCOUNTER — Ambulatory Visit (INDEPENDENT_AMBULATORY_CARE_PROVIDER_SITE_OTHER): Payer: Medicaid Other | Admitting: Obstetrics & Gynecology

## 2015-06-01 ENCOUNTER — Ambulatory Visit (INDEPENDENT_AMBULATORY_CARE_PROVIDER_SITE_OTHER): Payer: Medicaid Other

## 2015-06-01 ENCOUNTER — Encounter: Payer: Self-pay | Admitting: Obstetrics & Gynecology

## 2015-06-01 VITALS — BP 100/60 | HR 82 | Wt 294.0 lb

## 2015-06-01 DIAGNOSIS — O09212 Supervision of pregnancy with history of pre-term labor, second trimester: Secondary | ICD-10-CM

## 2015-06-01 DIAGNOSIS — Z3482 Encounter for supervision of other normal pregnancy, second trimester: Secondary | ICD-10-CM | POA: Diagnosis not present

## 2015-06-01 DIAGNOSIS — Z363 Encounter for antenatal screening for malformations: Secondary | ICD-10-CM

## 2015-06-01 DIAGNOSIS — Z3492 Encounter for supervision of normal pregnancy, unspecified, second trimester: Secondary | ICD-10-CM

## 2015-06-01 DIAGNOSIS — Z36 Encounter for antenatal screening of mother: Secondary | ICD-10-CM

## 2015-06-01 DIAGNOSIS — E669 Obesity, unspecified: Secondary | ICD-10-CM

## 2015-06-01 DIAGNOSIS — O09892 Supervision of other high risk pregnancies, second trimester: Secondary | ICD-10-CM

## 2015-06-01 DIAGNOSIS — Z1389 Encounter for screening for other disorder: Secondary | ICD-10-CM

## 2015-06-01 DIAGNOSIS — Z3A21 21 weeks gestation of pregnancy: Secondary | ICD-10-CM | POA: Diagnosis not present

## 2015-06-01 DIAGNOSIS — Z331 Pregnant state, incidental: Secondary | ICD-10-CM | POA: Diagnosis not present

## 2015-06-01 LAB — POCT URINALYSIS DIPSTICK
Blood, UA: NEGATIVE
Glucose, UA: NEGATIVE
Leukocytes, UA: NEGATIVE
NITRITE UA: NEGATIVE
PROTEIN UA: NEGATIVE

## 2015-06-01 NOTE — Progress Notes (Signed)
[id number] 1847w1d Estimated Date of Delivery: 10/11/15  Blood pressure 100/60, pulse 82, weight 294 lb (133.358 kg), last menstrual period 01/04/2015.   BP weight and urine results all reviewed and noted.  Please refer to the obstetrical flow sheet for the fundal height and fetal heart rate documentation:  Patient reports good fetal movement, denies any bleeding and no rupture of membranes symptoms or regular contractions. Patient is without complaints. All questions were answered.  Plan:  Continued routine obstetrical care,   Follow up in 4 weeks for OB appointment, ob visit Thursday 17P

## 2015-06-01 NOTE — Progress Notes (Signed)
US 21+1wks measurements c/w dates,efw 477g,post pl gr 0,post pl,fht 146bpm,afi sdp 5.3cm,cx 4.3 cm,anatomy complete w/no obvious abn seen.

## 2015-06-03 ENCOUNTER — Ambulatory Visit (INDEPENDENT_AMBULATORY_CARE_PROVIDER_SITE_OTHER): Payer: Medicaid Other | Admitting: *Deleted

## 2015-06-03 ENCOUNTER — Encounter: Payer: Self-pay | Admitting: *Deleted

## 2015-06-03 ENCOUNTER — Encounter: Payer: Medicaid Other | Admitting: Advanced Practice Midwife

## 2015-06-03 VITALS — BP 110/60 | HR 80 | Ht 67.0 in | Wt 293.5 lb

## 2015-06-03 DIAGNOSIS — O09212 Supervision of pregnancy with history of pre-term labor, second trimester: Secondary | ICD-10-CM

## 2015-06-03 DIAGNOSIS — Z331 Pregnant state, incidental: Secondary | ICD-10-CM | POA: Diagnosis not present

## 2015-06-03 DIAGNOSIS — O09892 Supervision of other high risk pregnancies, second trimester: Secondary | ICD-10-CM

## 2015-06-03 DIAGNOSIS — Z1389 Encounter for screening for other disorder: Secondary | ICD-10-CM

## 2015-06-03 DIAGNOSIS — Z3492 Encounter for supervision of normal pregnancy, unspecified, second trimester: Secondary | ICD-10-CM

## 2015-06-03 LAB — POCT URINALYSIS DIPSTICK
GLUCOSE UA: NEGATIVE
Ketones, UA: NEGATIVE
Nitrite, UA: NEGATIVE
PROTEIN UA: NEGATIVE

## 2015-06-03 MED ORDER — HYDROXYPROGESTERONE CAPROATE 250 MG/ML IM OIL
250.0000 mg | TOPICAL_OIL | Freq: Once | INTRAMUSCULAR | Status: AC
  Administered 2015-06-03: 250 mg via INTRAMUSCULAR

## 2015-06-03 NOTE — Progress Notes (Signed)
Pt here for 17P. Reports no problems at this time. Return in 1 week for next shot. JSY 

## 2015-06-10 ENCOUNTER — Other Ambulatory Visit: Payer: Self-pay | Admitting: Obstetrics & Gynecology

## 2015-06-10 ENCOUNTER — Encounter: Payer: Self-pay | Admitting: *Deleted

## 2015-06-10 ENCOUNTER — Ambulatory Visit (INDEPENDENT_AMBULATORY_CARE_PROVIDER_SITE_OTHER): Payer: Medicaid Other | Admitting: *Deleted

## 2015-06-10 VITALS — BP 100/60 | HR 80 | Ht 67.0 in | Wt 292.5 lb

## 2015-06-10 DIAGNOSIS — O09892 Supervision of other high risk pregnancies, second trimester: Secondary | ICD-10-CM

## 2015-06-10 DIAGNOSIS — O09212 Supervision of pregnancy with history of pre-term labor, second trimester: Secondary | ICD-10-CM

## 2015-06-10 DIAGNOSIS — Z1389 Encounter for screening for other disorder: Secondary | ICD-10-CM

## 2015-06-10 DIAGNOSIS — N390 Urinary tract infection, site not specified: Secondary | ICD-10-CM

## 2015-06-10 DIAGNOSIS — Z3492 Encounter for supervision of normal pregnancy, unspecified, second trimester: Secondary | ICD-10-CM

## 2015-06-10 DIAGNOSIS — R319 Hematuria, unspecified: Secondary | ICD-10-CM

## 2015-06-10 DIAGNOSIS — Z331 Pregnant state, incidental: Secondary | ICD-10-CM

## 2015-06-10 LAB — POCT URINALYSIS DIPSTICK
Glucose, UA: NEGATIVE
Ketones, UA: NEGATIVE
NITRITE UA: POSITIVE
Protein, UA: NEGATIVE

## 2015-06-10 MED ORDER — HYDROXYPROGESTERONE CAPROATE 250 MG/ML IM OIL
250.0000 mg | TOPICAL_OIL | Freq: Once | INTRAMUSCULAR | Status: AC
  Administered 2015-06-10: 250 mg via INTRAMUSCULAR

## 2015-06-10 MED ORDER — CEPHALEXIN 500 MG PO CAPS: 500.0000 mg | ORAL_CAPSULE | Freq: Three times a day (TID) | ORAL | Status: DC

## 2015-06-10 NOTE — Progress Notes (Signed)
Pt here for 17P. Pt reports having odor with urine. Pain in back and both sides. Nitrites present in urine. I spoke with Dr. Despina HiddenEure and he sent in a rx for Keflex to pt's pharmacy. Urine sent for culture. Return in 1 week for next shot. JSY

## 2015-06-12 LAB — URINE CULTURE

## 2015-06-17 ENCOUNTER — Encounter: Payer: Self-pay | Admitting: *Deleted

## 2015-06-17 ENCOUNTER — Ambulatory Visit (INDEPENDENT_AMBULATORY_CARE_PROVIDER_SITE_OTHER): Payer: Medicaid Other | Admitting: *Deleted

## 2015-06-17 VITALS — BP 110/68 | HR 80 | Wt 296.0 lb

## 2015-06-17 DIAGNOSIS — O09212 Supervision of pregnancy with history of pre-term labor, second trimester: Secondary | ICD-10-CM

## 2015-06-17 DIAGNOSIS — Z1389 Encounter for screening for other disorder: Secondary | ICD-10-CM | POA: Diagnosis not present

## 2015-06-17 DIAGNOSIS — Z8751 Personal history of pre-term labor: Secondary | ICD-10-CM

## 2015-06-17 DIAGNOSIS — Z331 Pregnant state, incidental: Secondary | ICD-10-CM

## 2015-06-17 LAB — POCT URINALYSIS DIPSTICK
Blood, UA: 1
Glucose, UA: NEGATIVE
KETONES UA: NEGATIVE
Nitrite, UA: POSITIVE
Protein, UA: NEGATIVE

## 2015-06-17 MED ORDER — HYDROXYPROGESTERONE CAPROATE 250 MG/ML IM OIL
250.0000 mg | TOPICAL_OIL | Freq: Once | INTRAMUSCULAR | Status: AC
  Administered 2015-06-17: 250 mg via INTRAMUSCULAR

## 2015-06-17 NOTE — Progress Notes (Signed)
Patient ID: Patricia [name], female   DOB: 03/26/1991, 24 y.o.   MRN: [id number] Hydroxprogesterone Caproate 250 mg IM given right deltoid with no complications. Pt UA dipstick showed Nitrates. Pt states she was given Keflex for UTI last week and will complete today. Pt encouraged to complete the Keflex push plenty of water and cranberry juice. Pt returns 1 week for 17P.

## 2015-06-17 NOTE — Addendum Note (Signed)
Addended by: Criss Alvine on: 06/17/2015 10:42 AM   Modules accepted: Orders

## 2015-06-21 ENCOUNTER — Telehealth: Payer: Self-pay | Admitting: Women's Health

## 2015-06-21 NOTE — Telephone Encounter (Signed)
Pt states she was being treated for UTI, when she was in here last Thursday for her 17P injection she still had nitrites in urine.  She states she finished the ABX last Thursday night but is still having a strong odor with urination and having abdominal pain that she thinks is coming from a UTI, she is not scheduled to be seen by a provider until 08/02 but she feel she still has a UTI and needs another round of medication.  Pt denies any vaginal bleeding and is drinking lots of water.  Please advise.

## 2015-06-22 ENCOUNTER — Other Ambulatory Visit (INDEPENDENT_AMBULATORY_CARE_PROVIDER_SITE_OTHER): Payer: Medicaid Other

## 2015-06-22 ENCOUNTER — Other Ambulatory Visit: Payer: Self-pay | Admitting: Women's Health

## 2015-06-22 DIAGNOSIS — Z331 Pregnant state, incidental: Secondary | ICD-10-CM | POA: Diagnosis not present

## 2015-06-22 DIAGNOSIS — Z1389 Encounter for screening for other disorder: Secondary | ICD-10-CM

## 2015-06-22 DIAGNOSIS — O2342 Unspecified infection of urinary tract in pregnancy, second trimester: Secondary | ICD-10-CM

## 2015-06-22 DIAGNOSIS — R309 Painful micturition, unspecified: Secondary | ICD-10-CM

## 2015-06-22 LAB — POCT URINALYSIS DIPSTICK
Blood, UA: NEGATIVE
Glucose, UA: NEGATIVE
Nitrite, UA: POSITIVE

## 2015-06-22 MED ORDER — NITROFURANTOIN MONOHYD MACRO 100 MG PO CAPS: 100.0000 mg | ORAL_CAPSULE | Freq: Every day | ORAL | Status: DC

## 2015-06-22 MED ORDER — AMOXICILLIN-POT CLAVULANATE 875-125 MG PO TABS: 1.0000 | ORAL_TABLET | Freq: Two times a day (BID) | ORAL | Status: DC

## 2015-06-22 NOTE — Addendum Note (Signed)
Addended by: Richardson Chiquito on: 06/22/2015 03:10 PM   Modules accepted: Orders

## 2015-06-22 NOTE — Progress Notes (Signed)
Patient called today c/o urinary symptoms. Urine was dipped and sent to lab for culture. Urine was positive for nitrates, with a trace of protein, WBC's, and small amount of keytones. Spoke with Selena Batten, and she stated she would send in prescription to pharmacy and urine will be sent for culture. Patient aware.

## 2015-06-22 NOTE — Telephone Encounter (Signed)
Spoke with pt, she will come in today about 2 pm for urine dip and CX, push fluids and return for her scheduled 17P appointment on Thursday.

## 2015-06-22 NOTE — Progress Notes (Signed)
Pt called yest afternoon, thinks she still has uti. Came in today for urine dipstick and resend urine cx. Called pt to notify urine still + for nitrates, will send cx. Has persistent E-coli uti, cx x 2 revealed pan-sensitive, tx w/ macrobid then keflex- didn't miss any pills and completed both rx's. No fever/chills, flank pain. Will treat w/ augmentin 875 bid x 7d, then macrobid  qhs for remainder of pregnancy. Will need repeat urine cx for POC at next visit.  Cheral Marker, CNM, WHNP-BC 06/22/2015 1:50 PM

## 2015-06-24 ENCOUNTER — Ambulatory Visit (INDEPENDENT_AMBULATORY_CARE_PROVIDER_SITE_OTHER): Payer: Medicaid Other | Admitting: *Deleted

## 2015-06-24 ENCOUNTER — Encounter: Payer: Self-pay | Admitting: *Deleted

## 2015-06-24 VITALS — BP 102/60 | HR 84 | Ht 67.0 in | Wt 298.0 lb

## 2015-06-24 DIAGNOSIS — O09892 Supervision of other high risk pregnancies, second trimester: Secondary | ICD-10-CM

## 2015-06-24 DIAGNOSIS — Z331 Pregnant state, incidental: Secondary | ICD-10-CM | POA: Diagnosis not present

## 2015-06-24 DIAGNOSIS — Z1389 Encounter for screening for other disorder: Secondary | ICD-10-CM

## 2015-06-24 DIAGNOSIS — O09212 Supervision of pregnancy with history of pre-term labor, second trimester: Secondary | ICD-10-CM

## 2015-06-24 LAB — URINE CULTURE

## 2015-06-24 LAB — POCT URINALYSIS DIPSTICK
Blood, UA: NEGATIVE
Glucose, UA: NEGATIVE
KETONES UA: NEGATIVE
Nitrite, UA: NEGATIVE
PROTEIN UA: NEGATIVE

## 2015-06-24 MED ORDER — HYDROXYPROGESTERONE CAPROATE 250 MG/ML IM OIL
250.0000 mg | TOPICAL_OIL | Freq: Once | INTRAMUSCULAR | Status: AC
  Administered 2015-06-24: 250 mg via INTRAMUSCULAR

## 2015-06-24 NOTE — Progress Notes (Signed)
Patient ID: Patricia [name], female   DOB: October 31, 1991, 24 y.o.   MRN: [id number] Pt here today for weekly 17P injection. Pt spoke with Cyril Mourning about new medications. Pt denies any problems or concerns at this time. Pt given 17P and tolerated well.

## 2015-06-25 ENCOUNTER — Encounter (HOSPITAL_COMMUNITY): Payer: Self-pay

## 2015-06-25 ENCOUNTER — Inpatient Hospital Stay (HOSPITAL_COMMUNITY)
Admission: AD | Admit: 2015-06-25 | Discharge: 2015-06-25 | Disposition: A | Discharge status: Home / Self Care | Payer: Medicaid Other | Source: Ambulatory Visit | Attending: Family Medicine | Admitting: Family Medicine

## 2015-06-25 DIAGNOSIS — Z3A24 24 weeks gestation of pregnancy: Secondary | ICD-10-CM | POA: Insufficient documentation

## 2015-06-25 DIAGNOSIS — G43909 Migraine, unspecified, not intractable, without status migrainosus: Secondary | ICD-10-CM | POA: Diagnosis present

## 2015-06-25 DIAGNOSIS — G43009 Migraine without aura, not intractable, without status migrainosus: Secondary | ICD-10-CM

## 2015-06-25 DIAGNOSIS — J329 Chronic sinusitis, unspecified: Secondary | ICD-10-CM | POA: Insufficient documentation

## 2015-06-25 DIAGNOSIS — O9989 Other specified diseases and conditions complicating pregnancy, childbirth and the puerperium: Secondary | ICD-10-CM | POA: Insufficient documentation

## 2015-06-25 DIAGNOSIS — J018 Other acute sinusitis: Secondary | ICD-10-CM

## 2015-06-25 LAB — URINALYSIS, ROUTINE W REFLEX MICROSCOPIC
BILIRUBIN URINE: NEGATIVE
GLUCOSE, UA: NEGATIVE mg/dL
HGB URINE DIPSTICK: NEGATIVE
Ketones, ur: NEGATIVE mg/dL
Leukocytes, UA: NEGATIVE
NITRITE: NEGATIVE
PH: 5.5 (ref 5.0–8.0)
Protein, ur: NEGATIVE mg/dL
Urobilinogen, UA: 0.2 mg/dL (ref 0.0–1.0)

## 2015-06-25 MED ORDER — AZITHROMYCIN 250 MG PO TABS: ORAL_TABLET | ORAL | Status: AC

## 2015-06-25 MED ORDER — PROMETHAZINE HCL 25 MG/ML IJ SOLN: 12.5000 mg | Freq: Once | INTRAMUSCULAR | Status: DC

## 2015-06-25 MED ORDER — LACTATED RINGERS IV SOLN: INTRAVENOUS | Status: DC

## 2015-06-25 MED ORDER — METOCLOPRAMIDE HCL 5 MG/ML IJ SOLN: 10.0000 mg | Freq: Once | INTRAMUSCULAR | Status: DC

## 2015-06-25 MED ORDER — DEXAMETHASONE SODIUM PHOSPHATE 10 MG/ML IJ SOLN: 10.0000 mg | Freq: Once | INTRAMUSCULAR | Status: DC

## 2015-06-25 NOTE — MAU Note (Addendum)
Migraine yesterday afternoon and still have headache. Tylenol did not help. Stuffy nose and runny. Some abdominal pain upper and lower abd. Baby not moving as much as usual today. On antibiotic for uti.

## 2015-06-25 NOTE — MAU Provider Note (Signed)
  History     CSN: [id number]  Arrival date and time: 06/25/15 2129   First Provider Initiated Contact with Patient 06/25/15 2243      Chief Complaint  Patient presents with  . Headache  . Nasal Congestion  . Abdominal Pain   HPI  Patricia [name] 24 y.o. [id number] @ [redacted]w[redacted]d complains of migraine headache since yesterday Reports yellow drainage from nose. Denies nausea and vomiting, vaginal bleeding, contractions.  Past Medical History  Diagnosis Date  . Preterm labor without delivery in second trimester June 2012    Positive FFN in June  . UTI (lower urinary tract infection)   . Bacterial vaginosis   . Chlamydia   . Migraine   . Preterm labor     Past Surgical History  Procedure Laterality Date  . No past surgeries      Family History  Problem Relation Age of Onset  . Diabetes Other     great granmother  . Diabetes Paternal Grandfather   . Hypertension Paternal Grandfather   . Cancer Paternal Grandfather     prostate    History  Substance Use Topics  . Smoking status: Never Smoker   . Smokeless tobacco: Never Used  . Alcohol Use: No    Allergies: No Known Allergies  Prescriptions prior to admission  Medication Sig Dispense Refill Last Dose  . acetaminophen (TYLENOL) 500 MG tablet Take 1,000 mg by mouth every 6 (six) hours as needed for headache.   06/25/2015 at 1600  . amoxicillin-clavulanate (AUGMENTIN) 875-125 MG per tablet Take 1 tablet by mouth 2 (two) times daily. X 7days 14 tablet 0 06/25/2015 at Unknown time  . Prenat-FeFum-FePo-FA-Omega 3 (CONCEPT DHA) 53.5-38-1 MG CAPS Take 1 capsule by mouth daily. 30 capsule 11 06/25/2015 at Unknown time  . nitrofurantoin, macrocrystal-monohydrate, (MACROBID) 100 MG capsule Take 1 capsule (100 mg total) by mouth at bedtime. Begin after finished with augmentin (Patient not taking: Reported on 06/24/2015) 30 capsule 6 Not Taking    [name]  Constitutional: Negative for fever.  HENT: Positive for congestion.    Gastrointestinal: Negative for nausea, vomiting and abdominal pain.  Neurological: Positive for headaches.  All other systems reviewed and are negative.  Physical Exam   Blood pressure 135/77, pulse 86, temperature 98.6 F (37 C), resp. rate 18, height  (1.702 m), weight 134.9 kg (297 lb 6.4 oz), last menstrual period 01/04/2015, SpO2 100 %.  Physical Exam  Nursing note and vitals reviewed. Constitutional: She is oriented to person, place, and time. She appears well-developed and well-nourished. No distress.  HENT:  Head: Normocephalic and atraumatic.  Nose: Nose normal.  Cardiovascular: Normal rate.   Respiratory: Effort normal and breath sounds normal. No respiratory distress. She has no wheezes.  GI: Soft. There is no tenderness.  Neurological: She is alert and oriented to person, place, and time.  Skin: Skin is warm and dry.  Psychiatric: She has a normal mood and affect. Her behavior is normal. Judgment and thought content normal.    MAU Course  Procedures  MDM Will treat migraine with IVF and medication and then discharge pt with a RX for zpack for sinus infection. FHr -Baseline 145; appropriate for gestional age ; no contractions  Assessment and Plan  Sinus Infection Migraine Headache  Discharge to home  [facility] [place] 06/25/2015, 10:48 PM

## 2015-06-25 NOTE — Discharge Instructions (Signed)
Migraine Headache A migraine headache is an intense, throbbing pain on one or both sides of your head. A migraine can last for 30 minutes to several hours. CAUSES  The exact cause of a migraine headache is not always known. However, a migraine may be caused when nerves in the brain become irritated and release chemicals that cause inflammation. This causes pain. Certain things may also trigger migraines, such as:  Alcohol.  Smoking.  Stress.  Menstruation.  Aged cheeses.  Foods or drinks that contain nitrates, glutamate, aspartame, or tyramine.  Lack of sleep.  Chocolate.  Caffeine.  Hunger.  Physical exertion.  Fatigue.  Medicines used to treat chest pain (nitroglycerine), birth control pills, estrogen, and some blood pressure medicines. SIGNS AND SYMPTOMS  Pain on one or both sides of your head.  Pulsating or throbbing pain.  Severe pain that prevents daily activities.  Pain that is aggravated by any physical activity.  Nausea, vomiting, or both.  Dizziness.  Pain with exposure to bright lights, loud noises, or activity.  General sensitivity to bright lights, loud noises, or smells. Before you get a migraine, you may get warning signs that a migraine is coming (aura). An aura may include:  Seeing flashing lights.  Seeing bright spots, halos, or zigzag lines.  Having tunnel vision or blurred vision.  Having feelings of numbness or tingling.  Having trouble talking.  Having muscle weakness. DIAGNOSIS  A migraine headache is often diagnosed based on:  Symptoms.  Physical exam.  A CT scan or MRI of your head. These imaging tests cannot diagnose migraines, but they can help rule out other causes of headaches. TREATMENT Medicines may be given for pain and nausea. Medicines can also be given to help prevent recurrent migraines.  HOME CARE INSTRUCTIONS  Only take over-the-counter or prescription medicines for pain or discomfort as directed by your  health care provider. The use of long-term narcotics is not recommended.  Lie down in a dark, quiet room when you have a migraine.  Keep a journal to find out what may trigger your migraine headaches. For example, write down:  What you eat and drink.  How much sleep you get.  Any change to your diet or medicines.  Limit alcohol consumption.  Quit smoking if you smoke.  Get 7-9 hours of sleep, or as recommended by your health care provider.  Limit stress.  Keep lights dim if bright lights bother you and make your migraines worse. SEEK IMMEDIATE MEDICAL CARE IF:   Your migraine becomes severe.  You have a fever.  You have a stiff neck.  You have vision loss.  You have muscular weakness or loss of muscle control.  You start losing your balance or have trouble walking.  You feel faint or pass out.  You have severe symptoms that are different from your first symptoms. MAKE SURE YOU:   Understand these instructions.  Will watch your condition.  Will get help right away if you are not doing well or get worse. Document Released: 11/13/2005 Document Revised: 03/30/2014 Document Reviewed: 07/21/2013 Exit[employer] Patient Information 2015 [employer]. This information is not intended to replace advice given to you by your health care provider. Make sure you discuss any questions you have with your health care provider.  Sinusitis Sinusitis is redness, soreness, and inflammation of the paranasal sinuses. Paranasal sinuses are air pockets within the bones of your face (beneath the eyes, the middle of the forehead, or above the eyes). In healthy paranasal sinuses, mucus   is able to drain out, and air is able to circulate through them by way of your nose. However, when your paranasal sinuses are inflamed, mucus and air can become trapped. This can allow bacteria and other germs to grow and cause infection. Sinusitis can develop quickly and last only a short time (acute) or continue  over a long period (chronic). Sinusitis that lasts for more than 12 weeks is considered chronic.  CAUSES  Causes of sinusitis include:  Allergies.  Structural abnormalities, such as displacement of the cartilage that separates your nostrils (deviated septum), which can decrease the air flow through your nose and sinuses and affect sinus drainage.  Functional abnormalities, such as when the small hairs (cilia) that line your sinuses and help remove mucus do not work properly or are not present. SIGNS AND SYMPTOMS  Symptoms of acute and chronic sinusitis are the same. The primary symptoms are pain and pressure around the affected sinuses. Other symptoms include:  Upper toothache.  Earache.  Headache.  Bad breath.  Decreased sense of smell and taste.  A cough, which worsens when you are lying flat.  Fatigue.  Fever.  Thick drainage from your nose, which often is green and may contain pus (purulent).  Swelling and warmth over the affected sinuses. DIAGNOSIS  Your health care provider will perform a physical exam. During the exam, your health care provider may:  Look in your nose for signs of abnormal growths in your nostrils (nasal polyps).  Tap over the affected sinus to check for signs of infection.  View the inside of your sinuses (endoscopy) using an imaging device that has a light attached (endoscope). If your health care provider suspects that you have chronic sinusitis, one or more of the following tests may be recommended:  Allergy tests.  Nasal culture. A sample of mucus is taken from your nose, sent to a lab, and screened for bacteria.  Nasal cytology. A sample of mucus is taken from your nose and examined by your health care provider to determine if your sinusitis is related to an allergy. TREATMENT  Most cases of acute sinusitis are related to a viral infection and will resolve on their own within 10 days. Sometimes medicines are prescribed to help relieve  symptoms (pain medicine, decongestants, nasal steroid sprays, or saline sprays).  However, for sinusitis related to a bacterial infection, your health care provider will prescribe antibiotic medicines. These are medicines that will help kill the bacteria causing the infection.  Rarely, sinusitis is caused by a fungal infection. In theses cases, your health care provider will prescribe antifungal medicine. For some cases of chronic sinusitis, surgery is needed. Generally, these are cases in which sinusitis recurs more than 3 times per year, despite other treatments. HOME CARE INSTRUCTIONS   Drink plenty of water. Water helps thin the mucus so your sinuses can drain more easily.  Use a humidifier.  Inhale steam 3 to 4 times a day (for example, sit in the bathroom with the shower running).  Apply a warm, moist washcloth to your face 3 to 4 times a day, or as directed by your health care provider.  Use saline nasal sprays to help moisten and clean your sinuses.  Take medicines only as directed by your health care provider.  If you were prescribed either an antibiotic or antifungal medicine, finish it all even if you start to feel better. SEEK IMMEDIATE MEDICAL CARE IF:  You have increasing pain or severe headaches.  You have nausea, vomiting,   or drowsiness.  You have swelling around your face.  You have vision problems.  You have a stiff neck.  You have difficulty breathing. MAKE SURE YOU:   Understand these instructions.  Will watch your condition.  Will get help right away if you are not doing well or get worse. Document Released: 11/13/2005 Document Revised: 03/30/2014 Document Reviewed: 11/28/2011 ExitCare Patient Information 2015 [employer]. This information is not intended to replace advice given to you by your health care provider. Make sure you discuss any questions you have with your health care provider.  

## 2015-06-28 ENCOUNTER — Telehealth: Payer: Self-pay | Admitting: *Deleted

## 2015-06-28 NOTE — Telephone Encounter (Signed)
Pt taking ABX for UTI, states now having a white thick discharge. Pt states she cannot come in today but does have an appt tomorrow.  Requesting med for yeast infection.

## 2015-06-28 NOTE — Telephone Encounter (Signed)
Has white discharge, no itching or burning, just bath with soap and water and keep appt tomorrow and no sex

## 2015-06-29 ENCOUNTER — Ambulatory Visit (INDEPENDENT_AMBULATORY_CARE_PROVIDER_SITE_OTHER): Payer: Medicaid Other | Admitting: Women's Health

## 2015-06-29 ENCOUNTER — Encounter: Payer: Self-pay | Admitting: Women's Health

## 2015-06-29 VITALS — BP 118/60 | HR 68 | Wt 294.0 lb

## 2015-06-29 DIAGNOSIS — Z3482 Encounter for supervision of other normal pregnancy, second trimester: Secondary | ICD-10-CM

## 2015-06-29 DIAGNOSIS — N9089 Other specified noninflammatory disorders of vulva and perineum: Secondary | ICD-10-CM

## 2015-06-29 DIAGNOSIS — Z331 Pregnant state, incidental: Secondary | ICD-10-CM

## 2015-06-29 DIAGNOSIS — O09212 Supervision of pregnancy with history of pre-term labor, second trimester: Secondary | ICD-10-CM

## 2015-06-29 DIAGNOSIS — O26842 Uterine size-date discrepancy, second trimester: Secondary | ICD-10-CM

## 2015-06-29 DIAGNOSIS — Z3A25 25 weeks gestation of pregnancy: Secondary | ICD-10-CM

## 2015-06-29 DIAGNOSIS — O23592 Infection of other part of genital tract in pregnancy, second trimester: Secondary | ICD-10-CM | POA: Diagnosis not present

## 2015-06-29 DIAGNOSIS — Z1389 Encounter for screening for other disorder: Secondary | ICD-10-CM

## 2015-06-29 DIAGNOSIS — Z3492 Encounter for supervision of normal pregnancy, unspecified, second trimester: Secondary | ICD-10-CM

## 2015-06-29 LAB — POCT WET PREP (WET MOUNT): Clue Cells Wet Prep Whiff POC: NEGATIVE

## 2015-06-29 LAB — POCT URINALYSIS DIPSTICK
COLOR UA: NEGATIVE
GLUCOSE UA: NEGATIVE
KETONES UA: NEGATIVE
LEUKOCYTES UA: NEGATIVE
NITRITE UA: NEGATIVE
Protein, UA: NEGATIVE
RBC UA: NEGATIVE

## 2015-06-29 MED ORDER — NYSTATIN 100000 UNIT/GM EX OINT: 1.0000 "application " | TOPICAL_OINTMENT | Freq: Two times a day (BID) | CUTANEOUS | Status: DC

## 2015-06-29 MED ORDER — TRIAMCINOLONE ACETONIDE 0.1 % EX OINT: 1.0000 "application " | TOPICAL_OINTMENT | Freq: Two times a day (BID) | CUTANEOUS | Status: DC

## 2015-06-29 NOTE — Patient Instructions (Signed)
You will have your sugar test next visit.  Please do not eat or drink anything after midnight the night before you come, not even water.  You will be here for at least two hours.     Call the office ([phone]) or go to [facility] if:  You begin to have strong, frequent contractions  Your water breaks.  Sometimes it is a big gush of fluid, sometimes it is just a trickle that keeps getting your panties wet or running down your legs  You have vaginal bleeding.  It is normal to have a small amount of spotting if your cervix was checked.   You don't feel your baby moving like normal.  If you don't, get you something to eat and drink and lay down and focus on feeling your baby move.   If your baby is still not moving like normal, you should call the office or go to [facility].  Second Trimester of Pregnancy The second trimester is from week 13 through week 28, months 4 through 6. The second trimester is often a time when you feel your best. Your body has also adjusted to being pregnant, and you begin to feel better physically. Usually, morning sickness has lessened or quit completely, you may have more energy, and you may have an increase in appetite. The second trimester is also a time when the fetus is growing rapidly. At the end of the sixth month, the fetus is about 9 inches long and weighs about 1 pounds. You will likely begin to feel the baby move (quickening) between 18 and 20 weeks of the pregnancy. BODY CHANGES Your body goes through many changes during pregnancy. The changes vary from woman to woman.   Your weight will continue to increase. You will notice your lower abdomen bulging out.  You may begin to get stretch marks on your hips, abdomen, and breasts.  You may develop headaches that can be relieved by medicines approved by your health care provider.  You may urinate more often because the fetus is pressing on your bladder.  You may develop or continue to have  heartburn as a result of your pregnancy.  You may develop constipation because certain hormones are causing the muscles that push waste through your intestines to slow down.  You may develop hemorrhoids or swollen, bulging veins (varicose veins).  You may have back pain because of the weight gain and pregnancy hormones relaxing your joints between the bones in your pelvis and as a result of a shift in weight and the muscles that support your balance.  Your breasts will continue to grow and be tender.  Your gums may bleed and may be sensitive to brushing and flossing.  Dark spots or blotches (chloasma, mask of pregnancy) may develop on your face. This will likely fade after the baby is born.  A dark line from your belly button to the pubic area (linea nigra) may appear. This will likely fade after the baby is born.  You may have changes in your hair. These can include thickening of your hair, rapid growth, and changes in texture. Some women also have hair loss during or after pregnancy, or hair that feels dry or thin. Your hair will most likely return to normal after your baby is born. WHAT TO EXPECT AT YOUR PRENATAL VISITS During a routine prenatal visit:  You will be weighed to make sure you and the fetus are growing normally.  Your blood pressure will be taken.    Your abdomen will be measured to track your baby's growth.  The fetal heartbeat will be listened to.  Any test results from the previous visit will be discussed. Your health care provider may ask you:  How you are feeling.  If you are feeling the baby move.  If you have had any abnormal symptoms, such as leaking fluid, bleeding, severe headaches, or abdominal cramping.  If you have any questions. Other tests that may be performed during your second trimester include:  Blood tests that check for:  Low iron levels (anemia).  Gestational diabetes (between 24 and 28 weeks).  Rh antibodies.  Urine tests to check  for infections, diabetes, or protein in the urine.  An ultrasound to confirm the proper growth and development of the baby.  An amniocentesis to check for possible genetic problems.  Fetal screens for spina bifida and Down syndrome. HOME CARE INSTRUCTIONS   Avoid all smoking, herbs, alcohol, and unprescribed drugs. These chemicals affect the formation and growth of the baby.  Follow your health care provider's instructions regarding medicine use. There are medicines that are either safe or unsafe to take during pregnancy.  Exercise only as directed by your health care provider. Experiencing uterine cramps is a good sign to stop exercising.  Continue to eat regular, healthy meals.  Wear a good support bra for breast tenderness.  Do not use hot tubs, steam rooms, or saunas.  Wear your seat belt at all times when driving.  Avoid raw meat, uncooked cheese, cat litter boxes, and soil used by cats. These carry germs that can cause birth defects in the baby.  Take your prenatal vitamins.  Try taking a stool softener (if your health care provider approves) if you develop constipation. Eat more high-fiber foods, such as fresh vegetables or fruit and whole grains. Drink plenty of fluids to keep your urine clear or pale yellow.  Take warm sitz baths to soothe any pain or discomfort caused by hemorrhoids. Use hemorrhoid cream if your health care provider approves.  If you develop varicose veins, wear support hose. Elevate your feet for 15 minutes, 3-4 times a day. Limit salt in your diet.  Avoid heavy lifting, wear low heel shoes, and practice good posture.  Rest with your legs elevated if you have leg cramps or low back pain.  Visit your dentist if you have not gone yet during your pregnancy. Use a soft toothbrush to brush your teeth and be gentle when you floss.  A sexual relationship may be continued unless your health care provider directs you otherwise.  Continue to go to all your  prenatal visits as directed by your health care provider. SEEK MEDICAL CARE IF:   You have dizziness.  You have mild pelvic cramps, pelvic pressure, or nagging pain in the abdominal area.  You have persistent nausea, vomiting, or diarrhea.  You have a bad smelling vaginal discharge.  You have pain with urination. SEEK IMMEDIATE MEDICAL CARE IF:   You have a fever.  You are leaking fluid from your vagina.  You have spotting or bleeding from your vagina.  You have severe abdominal cramping or pain.  You have rapid weight gain or loss.  You have shortness of breath with chest pain.  You notice sudden or extreme swelling of your face, hands, ankles, feet, or legs.  You have not felt your baby move in over an hour.  You have severe headaches that do not go away with medicine.  You have vision changes.   Document Released: 11/07/2001 Document Revised: 11/18/2013 Document Reviewed: 01/14/2013 Exit[employer] Patient Information 2015 [employer]. This information is not intended to replace advice given to you by your health care provider. Make sure you discuss any questions you have with your health care provider.     

## 2015-06-29 NOTE — Progress Notes (Signed)
Low-risk OB appointment [id number] [redacted]w[redacted]d Estimated Date of Delivery: 10/11/15 BP 118/60 mmHg  Pulse 68  Wt 294 lb (133.358 kg)  LMP 01/04/2015  BP, weight, and urine reviewed.  Refer to obstetrical flow sheet for FH & FHR.  Reports good fm.  Denies regular uc's, lof, vb, or uti s/s. Vulvar irritation and thick white d/c since sun evening. Will finish augmentin for persistent e-coli uti tonight, then is to start macrobid q hs tomorrow night Fissure at anterior vulvar apex w/ some erythema. Spec exam: sm amt thin creamy white nonodorous d/c, wet prep neg, will rx mytrex cream for vulvar irritation Reviewed ptl s/s, fm. Plan:  Continue routine obstetrical care  F/U in 1wk for OB appointment, pn2 (will do at 26wks, A1DM last preg and S>D now), efw/afi u/s for s>d, will also need urine cx for poc persistent e-coli uti

## 2015-07-01 ENCOUNTER — Ambulatory Visit (INDEPENDENT_AMBULATORY_CARE_PROVIDER_SITE_OTHER): Payer: Medicaid Other | Admitting: *Deleted

## 2015-07-01 ENCOUNTER — Ambulatory Visit: Payer: Medicaid Other

## 2015-07-01 ENCOUNTER — Encounter: Payer: Self-pay | Admitting: *Deleted

## 2015-07-01 VITALS — BP 104/64 | HR 80 | Ht 67.0 in | Wt 297.0 lb

## 2015-07-01 DIAGNOSIS — Z1389 Encounter for screening for other disorder: Secondary | ICD-10-CM | POA: Diagnosis not present

## 2015-07-01 DIAGNOSIS — O09212 Supervision of pregnancy with history of pre-term labor, second trimester: Secondary | ICD-10-CM | POA: Diagnosis not present

## 2015-07-01 DIAGNOSIS — Z331 Pregnant state, incidental: Secondary | ICD-10-CM | POA: Diagnosis not present

## 2015-07-01 DIAGNOSIS — Z8751 Personal history of pre-term labor: Secondary | ICD-10-CM

## 2015-07-01 LAB — POCT URINALYSIS DIPSTICK
Blood, UA: NEGATIVE
Glucose, UA: NEGATIVE
Ketones, UA: NEGATIVE
Nitrite, UA: NEGATIVE
PROTEIN UA: NEGATIVE

## 2015-07-01 MED ORDER — HYDROXYPROGESTERONE CAPROATE 250 MG/ML IM OIL
250.0000 mg | TOPICAL_OIL | Freq: Once | INTRAMUSCULAR | Status: AC
  Administered 2015-07-01: 250 mg via INTRAMUSCULAR

## 2015-07-01 NOTE — Progress Notes (Signed)
Patient ID: Patricia [name], female   DOB: 1991-05-31, 24 y.o.   MRN: [id number] Hydroxyprogesterone Caproate 250 mg injection given right deltoid with no complications.  No complaints at this time. Pt states continues to take Macrobid for UTI. Pt to return next Friday, July 08, 2014 for PN2, OB visit and next 17 P injection.

## 2015-07-08 ENCOUNTER — Telehealth: Payer: Self-pay | Admitting: *Deleted

## 2015-07-08 NOTE — Telephone Encounter (Signed)
Pt states can she bring her forms to be completed for her employer to her appt tomorrow. Pt informed she can bring forms, our office request 5 day turn around and $29.00 form fee. Pt verbalized understanding.

## 2015-07-08 NOTE — Telephone Encounter (Signed)
Pt states she has applied for a position at [employer], will be standing on her feet for 12 hour shifts but no heavy lifting/pushing/ or pulling. Pt requesting a note stating she is pregnant, her due date, and unable to lift anything greater than 25 lbs. Pt informed will leave note at front desk for pick up.

## 2015-07-09 ENCOUNTER — Ambulatory Visit (INDEPENDENT_AMBULATORY_CARE_PROVIDER_SITE_OTHER): Payer: Medicaid Other

## 2015-07-09 ENCOUNTER — Ambulatory Visit (INDEPENDENT_AMBULATORY_CARE_PROVIDER_SITE_OTHER): Payer: Medicaid Other | Admitting: Obstetrics & Gynecology

## 2015-07-09 ENCOUNTER — Other Ambulatory Visit: Payer: Medicaid Other

## 2015-07-09 ENCOUNTER — Encounter: Payer: Self-pay | Admitting: Obstetrics & Gynecology

## 2015-07-09 VITALS — BP 110/70 | HR 84 | Wt 297.0 lb

## 2015-07-09 DIAGNOSIS — O09212 Supervision of pregnancy with history of pre-term labor, second trimester: Secondary | ICD-10-CM | POA: Diagnosis not present

## 2015-07-09 DIAGNOSIS — O09892 Supervision of other high risk pregnancies, second trimester: Secondary | ICD-10-CM

## 2015-07-09 DIAGNOSIS — O2332 Infections of other parts of urinary tract in pregnancy, second trimester: Secondary | ICD-10-CM | POA: Diagnosis not present

## 2015-07-09 DIAGNOSIS — Z131 Encounter for screening for diabetes mellitus: Secondary | ICD-10-CM

## 2015-07-09 DIAGNOSIS — Z3A26 26 weeks gestation of pregnancy: Secondary | ICD-10-CM

## 2015-07-09 DIAGNOSIS — Z331 Pregnant state, incidental: Secondary | ICD-10-CM

## 2015-07-09 DIAGNOSIS — Z1389 Encounter for screening for other disorder: Secondary | ICD-10-CM | POA: Diagnosis not present

## 2015-07-09 DIAGNOSIS — O26842 Uterine size-date discrepancy, second trimester: Secondary | ICD-10-CM | POA: Diagnosis not present

## 2015-07-09 DIAGNOSIS — Z3482 Encounter for supervision of other normal pregnancy, second trimester: Secondary | ICD-10-CM | POA: Diagnosis not present

## 2015-07-09 DIAGNOSIS — Z3492 Encounter for supervision of normal pregnancy, unspecified, second trimester: Secondary | ICD-10-CM

## 2015-07-09 DIAGNOSIS — Z369 Encounter for antenatal screening, unspecified: Secondary | ICD-10-CM

## 2015-07-09 LAB — POCT URINALYSIS DIPSTICK
Glucose, UA: NEGATIVE
KETONES UA: NEGATIVE
Leukocytes, UA: NEGATIVE
NITRITE UA: NEGATIVE
Protein, UA: NEGATIVE
RBC UA: NEGATIVE

## 2015-07-09 MED ORDER — HYDROXYPROGESTERONE CAPROATE 250 MG/ML IM OIL
250.0000 mg | TOPICAL_OIL | Freq: Once | INTRAMUSCULAR | Status: AC
  Administered 2015-07-09: 250 mg via INTRAMUSCULAR

## 2015-07-09 NOTE — Progress Notes (Signed)
[id number] [redacted]w[redacted]d Estimated Date of Delivery: 10/11/15  Blood pressure 110/70, pulse 84, weight 297 lb (134.718 kg), last menstrual period 01/04/2015.   BP weight and urine results all reviewed and noted.  Please refer to the obstetrical flow sheet for the fundal height and fetal heart rate documentation:  Patient reports good fetal movement, denies any bleeding and no rupture of membranes symptoms or regular contractions. Patient is without complaints. All questions were answered.  Plan:  Continued routine obstetrical care,   Follow up in 3 weeks for OB appointment,   Getting 17P On macrobid suppression for recurrent/chronic  UTI

## 2015-07-09 NOTE — Progress Notes (Signed)
[place] 26+4wks,measurements c/w dates,efw 982 53.5%,cephalic,post pl gr 0,normal ov's bilat,svp 5.7cm,afi 11.3cm,cx 3.9cm

## 2015-07-10 LAB — GLUCOSE TOLERANCE, 2 HOURS W/ 1HR
Glucose, 1 hour: 115 mg/dL (ref 65–179)
Glucose, 2 hour: 85 mg/dL (ref 65–152)
Glucose, Fasting: 81 mg/dL (ref 65–91)

## 2015-07-10 LAB — CBC
HEMATOCRIT: 30.9 % — AB (ref 34.0–46.6)
Hemoglobin: 10.3 g/dL — ABNORMAL LOW (ref 11.1–15.9)
MCH: 27.3 pg (ref 26.6–33.0)
MCHC: 33.3 g/dL (ref 31.5–35.7)
MCV: 82 fL (ref 79–97)
Platelets: 180 10*3/uL (ref 150–379)
RBC: 3.77 x10E6/uL (ref 3.77–5.28)
RDW: 14.2 % (ref 12.3–15.4)
WBC: 6.2 10*3/uL (ref 3.4–10.8)

## 2015-07-10 LAB — HIV ANTIBODY (ROUTINE TESTING W REFLEX): HIV Screen 4th Generation wRfx: NONREACTIVE

## 2015-07-10 LAB — ANTIBODY SCREEN: Antibody Screen: NEGATIVE

## 2015-07-10 LAB — RPR: RPR: NONREACTIVE

## 2015-07-10 LAB — HSV 2 ANTIBODY, IGG: HSV 2 Glycoprotein G Ab, IgG: >23.6 index — ABNORMAL HIGH (ref 0.00–0.90)

## 2015-07-12 ENCOUNTER — Other Ambulatory Visit: Payer: Self-pay | Admitting: Women's Health

## 2015-07-12 ENCOUNTER — Encounter: Payer: Self-pay | Admitting: Women's Health

## 2015-07-12 ENCOUNTER — Telehealth: Payer: Self-pay | Admitting: *Deleted

## 2015-07-12 DIAGNOSIS — R768 Other specified abnormal immunological findings in serum: Secondary | ICD-10-CM | POA: Insufficient documentation

## 2015-07-12 MED ORDER — FERROUS SULFATE 325 (65 FE) MG PO TABS: 325.0000 mg | ORAL_TABLET | Freq: Two times a day (BID) | ORAL | Status: DC

## 2015-07-12 NOTE — Telephone Encounter (Signed)
Pt informed of low hemoglobin, pt anemic per Joellyn Haff, CNM.  Iron supplement sent to pharmacy pt to take BID, continue PNV, and increase foods rich in iron, red meat, green leafy veg, and beans. Pt verbalized understanding.

## 2015-07-12 NOTE — Telephone Encounter (Signed)
Pt informed have received her job description for the provider to review, will have Joellyn Haff, CNM review today and will notify pt when complete.

## 2015-07-16 ENCOUNTER — Ambulatory Visit: Payer: Medicaid Other

## 2015-07-16 ENCOUNTER — Encounter: Payer: Self-pay | Admitting: Obstetrics & Gynecology

## 2015-07-16 ENCOUNTER — Encounter: Payer: Self-pay | Admitting: *Deleted

## 2015-07-16 ENCOUNTER — Ambulatory Visit (INDEPENDENT_AMBULATORY_CARE_PROVIDER_SITE_OTHER): Payer: Medicaid Other | Admitting: *Deleted

## 2015-07-16 VITALS — BP 110/58 | HR 88 | Ht 67.0 in | Wt 296.0 lb

## 2015-07-16 DIAGNOSIS — Z3492 Encounter for supervision of normal pregnancy, unspecified, second trimester: Secondary | ICD-10-CM

## 2015-07-16 DIAGNOSIS — O09212 Supervision of pregnancy with history of pre-term labor, second trimester: Secondary | ICD-10-CM

## 2015-07-16 DIAGNOSIS — O360931 Maternal care for other rhesus isoimmunization, third trimester, fetus 1: Secondary | ICD-10-CM

## 2015-07-16 DIAGNOSIS — Z1389 Encounter for screening for other disorder: Secondary | ICD-10-CM

## 2015-07-16 DIAGNOSIS — O09213 Supervision of pregnancy with history of pre-term labor, third trimester: Principal | ICD-10-CM

## 2015-07-16 DIAGNOSIS — Z331 Pregnant state, incidental: Secondary | ICD-10-CM

## 2015-07-16 DIAGNOSIS — O09893 Supervision of other high risk pregnancies, third trimester: Secondary | ICD-10-CM

## 2015-07-16 LAB — POCT URINALYSIS DIPSTICK
Blood, UA: NEGATIVE
Glucose, UA: NEGATIVE
KETONES UA: NEGATIVE
Leukocytes, UA: NEGATIVE
Nitrite, UA: NEGATIVE
PROTEIN UA: NEGATIVE

## 2015-07-16 MED ORDER — HYDROXYPROGESTERONE CAPROATE 250 MG/ML IM OIL
250.0000 mg | TOPICAL_OIL | Freq: Once | INTRAMUSCULAR | Status: AC
  Administered 2015-07-16: 250 mg via INTRAMUSCULAR

## 2015-07-16 MED ORDER — RHO D IMMUNE GLOBULIN 1500 UNIT/2ML IJ SOSY
300.0000 ug | PREFILLED_SYRINGE | Freq: Once | INTRAMUSCULAR | Status: AC
  Administered 2015-07-16: 300 ug via INTRAMUSCULAR

## 2015-07-16 NOTE — Progress Notes (Signed)
Pt here for 17P and Rhogam. Reports no problems at this time. Return in 1 week for next 17P. JSY

## 2015-07-23 ENCOUNTER — Encounter: Payer: Self-pay | Admitting: *Deleted

## 2015-07-23 ENCOUNTER — Ambulatory Visit: Payer: Medicaid Other

## 2015-07-23 ENCOUNTER — Ambulatory Visit (INDEPENDENT_AMBULATORY_CARE_PROVIDER_SITE_OTHER): Payer: Medicaid Other | Admitting: *Deleted

## 2015-07-23 VITALS — BP 110/60 | Ht 67.0 in | Wt 299.0 lb

## 2015-07-23 DIAGNOSIS — Z1389 Encounter for screening for other disorder: Secondary | ICD-10-CM

## 2015-07-23 DIAGNOSIS — Z331 Pregnant state, incidental: Secondary | ICD-10-CM

## 2015-07-23 DIAGNOSIS — O09893 Supervision of other high risk pregnancies, third trimester: Secondary | ICD-10-CM

## 2015-07-23 DIAGNOSIS — O09213 Supervision of pregnancy with history of pre-term labor, third trimester: Secondary | ICD-10-CM

## 2015-07-23 LAB — POCT URINALYSIS DIPSTICK
Blood, UA: NEGATIVE
GLUCOSE UA: NEGATIVE
Ketones, UA: NEGATIVE
LEUKOCYTES UA: NEGATIVE
NITRITE UA: NEGATIVE

## 2015-07-23 MED ORDER — HYDROXYPROGESTERONE CAPROATE 250 MG/ML IM OIL
250.0000 mg | TOPICAL_OIL | Freq: Once | INTRAMUSCULAR | Status: AC
  Administered 2015-07-23: 250 mg via INTRAMUSCULAR

## 2015-07-23 NOTE — Progress Notes (Signed)
Patient ID: Patricia [name], female   DOB: [birth year], 24 y.o.   MRN: [id number] Pt here today for 17P injection. Pt given injection and tolerated well. Pt denies any problems or concerns at this time.

## 2015-07-27 ENCOUNTER — Telehealth: Payer: Self-pay | Admitting: *Deleted

## 2015-07-27 NOTE — Telephone Encounter (Signed)
Pt c/o nausea/vomiting x 3 yesterday, pushing fluids and been able to eat today, no nausea or vomiting today. Pt states did call after line nurse and was told did not feel she needed to go to MAU but to push fluids and eat frequently. Pt states she has been able to eat and push fluids today and has not felt nauseated or vomited since 4 am. 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 Informed pt N/V could be also caused by her working at nights and sleeping during the day and being an adjustment. Pt informed to take the Diclegis as Joellyn Haff, CNM has prescribed in the past. If no improvement or pt continues to have the N/V to call our office back. Pt states she does have a appt this Friday with Dr. Despina Hidden.

## 2015-07-30 ENCOUNTER — Ambulatory Visit (INDEPENDENT_AMBULATORY_CARE_PROVIDER_SITE_OTHER): Payer: Medicaid Other | Admitting: Obstetrics & Gynecology

## 2015-07-30 VITALS — BP 100/58 | HR 84 | Wt 298.0 lb

## 2015-07-30 DIAGNOSIS — O09213 Supervision of pregnancy with history of pre-term labor, third trimester: Secondary | ICD-10-CM

## 2015-07-30 DIAGNOSIS — Z3A29 29 weeks gestation of pregnancy: Secondary | ICD-10-CM | POA: Diagnosis not present

## 2015-07-30 DIAGNOSIS — Z331 Pregnant state, incidental: Secondary | ICD-10-CM | POA: Diagnosis not present

## 2015-07-30 DIAGNOSIS — Z1389 Encounter for screening for other disorder: Secondary | ICD-10-CM | POA: Diagnosis not present

## 2015-07-30 DIAGNOSIS — O09893 Supervision of other high risk pregnancies, third trimester: Secondary | ICD-10-CM

## 2015-07-30 DIAGNOSIS — Z3483 Encounter for supervision of other normal pregnancy, third trimester: Secondary | ICD-10-CM | POA: Diagnosis not present

## 2015-07-30 DIAGNOSIS — Z3493 Encounter for supervision of normal pregnancy, unspecified, third trimester: Secondary | ICD-10-CM

## 2015-07-30 LAB — POCT URINALYSIS DIPSTICK
Glucose, UA: NEGATIVE
KETONES UA: NEGATIVE
Leukocytes, UA: NEGATIVE
NITRITE UA: NEGATIVE
Protein, UA: NEGATIVE
RBC UA: NEGATIVE

## 2015-07-30 MED ORDER — HYDROXYPROGESTERONE CAPROATE 250 MG/ML IM OIL
250.0000 mg | TOPICAL_OIL | Freq: Once | INTRAMUSCULAR | Status: AC
  Administered 2015-07-30: 250 mg via INTRAMUSCULAR

## 2015-07-30 NOTE — Progress Notes (Signed)
[id number] [redacted]w[redacted]d Estimated Date of Delivery: 10/11/15  Blood pressure 100/58, pulse 84, weight 298 lb (135.172 kg), last menstrual period 01/04/2015.   BP weight and urine results all reviewed and noted.  Please refer to the obstetrical flow sheet for the fundal height and fetal heart rate documentation:  Patient reports good fetal movement, denies any bleeding and no rupture of membranes symptoms or regular contractions. Patient is without complaints. All questions were answered.  Orders Placed This Encounter  Procedures  . POCT urinalysis dipstick    Plan:  Continued routine obstetrical care,   Return in about 1 week (around 08/06/2015) for 17P, 3 weeks LROB.

## 2015-08-03 ENCOUNTER — Encounter: Payer: Self-pay | Admitting: Obstetrics and Gynecology

## 2015-08-03 ENCOUNTER — Telehealth: Payer: Self-pay | Admitting: Women's Health

## 2015-08-03 ENCOUNTER — Ambulatory Visit (INDEPENDENT_AMBULATORY_CARE_PROVIDER_SITE_OTHER): Payer: Medicaid Other | Admitting: Obstetrics and Gynecology

## 2015-08-03 VITALS — BP 100/60 | HR 96 | Wt 293.6 lb

## 2015-08-03 DIAGNOSIS — Z1389 Encounter for screening for other disorder: Secondary | ICD-10-CM

## 2015-08-03 DIAGNOSIS — Z331 Pregnant state, incidental: Secondary | ICD-10-CM | POA: Diagnosis not present

## 2015-08-03 DIAGNOSIS — Z3493 Encounter for supervision of normal pregnancy, unspecified, third trimester: Secondary | ICD-10-CM | POA: Diagnosis not present

## 2015-08-03 DIAGNOSIS — Z3A30 30 weeks gestation of pregnancy: Secondary | ICD-10-CM | POA: Diagnosis not present

## 2015-08-03 DIAGNOSIS — O09213 Supervision of pregnancy with history of pre-term labor, third trimester: Secondary | ICD-10-CM | POA: Diagnosis not present

## 2015-08-03 LAB — POCT URINALYSIS DIPSTICK
Blood, UA: NEGATIVE
GLUCOSE UA: NEGATIVE
KETONES UA: NEGATIVE
Leukocytes, UA: NEGATIVE
Nitrite, UA: NEGATIVE
Protein, UA: NEGATIVE

## 2015-08-03 NOTE — Progress Notes (Signed)
High Risk Pregnancy Diagnosis(es):   Hx preterm delivery  Blood pressure 100/60, pulse 96, weight 293 lb 9.6 oz (133.176 kg), last menstrual period 01/04/2015. HPI: [id number] [redacted]w[redacted]d Estimated Date of Delivery: 10/11/15     Here  From work due to contraction q hr x 2, sent from work. Now resolved. No d/c or bleeding or pain.     BP weight and urine results reviewed and notable for . Patient reports    good fetal movement, denies any bleeding and no rupture of membranes symptoms or regular contractions .  Fundal Height:  34 Fetal Heart rate:  145 Edema:  no Urinalysis: Negative  .Assessment:[redacted]w[redacted]d,   No evidence ptl  Medication(s) Plans:  No change, continue 17P   Treatment Plan:        Return to wk tomorrow Follow up:           1 weeks for   17 P, 2 wk for provider, prn complaints , or change in condition All questions were answered.

## 2015-08-06 ENCOUNTER — Encounter: Payer: Self-pay | Admitting: *Deleted

## 2015-08-06 ENCOUNTER — Ambulatory Visit (INDEPENDENT_AMBULATORY_CARE_PROVIDER_SITE_OTHER): Payer: Medicaid Other | Admitting: *Deleted

## 2015-08-06 VITALS — BP 90/58 | HR 84 | Ht 67.0 in | Wt 293.0 lb

## 2015-08-06 DIAGNOSIS — Z3492 Encounter for supervision of normal pregnancy, unspecified, second trimester: Secondary | ICD-10-CM

## 2015-08-06 DIAGNOSIS — Z1389 Encounter for screening for other disorder: Secondary | ICD-10-CM | POA: Diagnosis not present

## 2015-08-06 DIAGNOSIS — N390 Urinary tract infection, site not specified: Secondary | ICD-10-CM

## 2015-08-06 DIAGNOSIS — Z331 Pregnant state, incidental: Secondary | ICD-10-CM | POA: Diagnosis not present

## 2015-08-06 DIAGNOSIS — O09213 Supervision of pregnancy with history of pre-term labor, third trimester: Secondary | ICD-10-CM

## 2015-08-06 DIAGNOSIS — O09893 Supervision of other high risk pregnancies, third trimester: Secondary | ICD-10-CM

## 2015-08-06 DIAGNOSIS — B9620 Unspecified Escherichia coli [E. coli] as the cause of diseases classified elsewhere: Secondary | ICD-10-CM

## 2015-08-06 LAB — POCT URINALYSIS DIPSTICK
Glucose, UA: NEGATIVE
Ketones, UA: NEGATIVE
NITRITE UA: NEGATIVE

## 2015-08-06 MED ORDER — HYDROXYPROGESTERONE CAPROATE 250 MG/ML IM OIL
250.0000 mg | TOPICAL_OIL | Freq: Once | INTRAMUSCULAR | Status: AC
  Administered 2015-08-06: 250 mg via INTRAMUSCULAR

## 2015-08-06 NOTE — Progress Notes (Signed)
Pt here for 17P. Reports no problems at this time. Return in 1 week for next shot. JSY 

## 2015-08-08 ENCOUNTER — Inpatient Hospital Stay (HOSPITAL_COMMUNITY)
Admission: AD | Admit: 2015-08-08 | Discharge: 2015-08-08 | Disposition: A | Discharge status: Home / Self Care | Payer: Medicaid Other | Source: Ambulatory Visit | Attending: Obstetrics & Gynecology | Admitting: Obstetrics & Gynecology

## 2015-08-08 ENCOUNTER — Encounter (HOSPITAL_COMMUNITY): Payer: Self-pay

## 2015-08-08 DIAGNOSIS — O26893 Other specified pregnancy related conditions, third trimester: Secondary | ICD-10-CM | POA: Diagnosis not present

## 2015-08-08 DIAGNOSIS — Z6791 Unspecified blood type, Rh negative: Secondary | ICD-10-CM | POA: Diagnosis present

## 2015-08-08 DIAGNOSIS — O26899 Other specified pregnancy related conditions, unspecified trimester: Secondary | ICD-10-CM | POA: Diagnosis present

## 2015-08-08 DIAGNOSIS — A499 Bacterial infection, unspecified: Secondary | ICD-10-CM

## 2015-08-08 DIAGNOSIS — R102 Pelvic and perineal pain unspecified side: Secondary | ICD-10-CM | POA: Diagnosis present

## 2015-08-08 DIAGNOSIS — O98812 Other maternal infectious and parasitic diseases complicating pregnancy, second trimester: Secondary | ICD-10-CM

## 2015-08-08 DIAGNOSIS — N898 Other specified noninflammatory disorders of vagina: Secondary | ICD-10-CM | POA: Diagnosis present

## 2015-08-08 DIAGNOSIS — Z3492 Encounter for supervision of normal pregnancy, unspecified, second trimester: Secondary | ICD-10-CM

## 2015-08-08 DIAGNOSIS — N760 Acute vaginitis: Secondary | ICD-10-CM

## 2015-08-08 DIAGNOSIS — O09213 Supervision of pregnancy with history of pre-term labor, third trimester: Secondary | ICD-10-CM | POA: Insufficient documentation

## 2015-08-08 DIAGNOSIS — Z3A30 30 weeks gestation of pregnancy: Secondary | ICD-10-CM | POA: Insufficient documentation

## 2015-08-08 DIAGNOSIS — O2343 Unspecified infection of urinary tract in pregnancy, third trimester: Secondary | ICD-10-CM | POA: Diagnosis present

## 2015-08-08 DIAGNOSIS — O2342 Unspecified infection of urinary tract in pregnancy, second trimester: Secondary | ICD-10-CM | POA: Diagnosis not present

## 2015-08-08 DIAGNOSIS — O09219 Supervision of pregnancy with history of pre-term labor, unspecified trimester: Secondary | ICD-10-CM

## 2015-08-08 DIAGNOSIS — B9689 Other specified bacterial agents as the cause of diseases classified elsewhere: Secondary | ICD-10-CM

## 2015-08-08 DIAGNOSIS — O09899 Supervision of other high risk pregnancies, unspecified trimester: Secondary | ICD-10-CM

## 2015-08-08 HISTORY — DX: Anemia, unspecified: D64.9

## 2015-08-08 LAB — URINALYSIS, ROUTINE W REFLEX MICROSCOPIC
GLUCOSE, UA: NEGATIVE mg/dL
HGB URINE DIPSTICK: NEGATIVE
Ketones, ur: 15 mg/dL — AB
Nitrite: NEGATIVE
PH: 6 (ref 5.0–8.0)
Protein, ur: NEGATIVE mg/dL
Urobilinogen, UA: 1 mg/dL (ref 0.0–1.0)

## 2015-08-08 LAB — WET PREP, GENITAL
Trich, Wet Prep: NONE SEEN
Yeast Wet Prep HPF POC: NONE SEEN

## 2015-08-08 LAB — FETAL FIBRONECTIN: Fetal Fibronectin: NEGATIVE

## 2015-08-08 LAB — URINE MICROSCOPIC-ADD ON

## 2015-08-08 LAB — URINE CULTURE: Organism ID, Bacteria: NO GROWTH

## 2015-08-08 MED ORDER — METRONIDAZOLE 500 MG PO TABS: 500.0000 mg | ORAL_TABLET | Freq: Two times a day (BID) | ORAL | Status: DC

## 2015-08-08 NOTE — Progress Notes (Signed)
Dr Alvester Morin notified of pt's admission and status. Aware of pt felling a lot of pressure in lower abd for couple wks, denies LOF or bleeding, saw mucous this am when wiped, no ctxs., u/a results, Will see pt

## 2015-08-08 NOTE — MAU Note (Signed)
Used to bathroom, after voided, noticed vaginal mucus when wiped, clear to grayish color, no bleeding. Baby moving well.  No leaking.  For past 2 weeks, felt abd pressure when baby pushes downwards, not painful.

## 2015-08-08 NOTE — Discharge Instructions (Signed)
Bacterial Vaginosis Bacterial vaginosis is a vaginal infection that occurs when the normal balance of bacteria in the vagina is disrupted. It results from an overgrowth of certain bacteria. This is the most common vaginal infection in women of childbearing age. Treatment is important to prevent complications, especially in pregnant women, as it can cause a premature delivery. CAUSES  Bacterial vaginosis is caused by an increase in harmful bacteria that are normally present in smaller amounts in the vagina. Several different kinds of bacteria can cause bacterial vaginosis. However, the reason that the condition develops is not fully understood. RISK FACTORS Certain activities or behaviors can put you at an increased risk of developing bacterial vaginosis, including:  Having a new sex partner or multiple sex partners.  Douching.  Using an intrauterine device (IUD) for contraception. Women do not get bacterial vaginosis from toilet seats, bedding, swimming pools, or contact with objects around them. SIGNS AND SYMPTOMS  Some women with bacterial vaginosis have no signs or symptoms. Common symptoms include:  Grey vaginal discharge.  A fishlike odor with discharge, especially after sexual intercourse.  Itching or burning of the vagina and vulva.  Burning or pain with urination. DIAGNOSIS  Your health care provider will take a medical history and examine the vagina for signs of bacterial vaginosis. A sample of vaginal fluid may be taken. Your health care provider will look at this sample under a microscope to check for bacteria and abnormal cells. A vaginal pH test may also be done.  TREATMENT  Bacterial vaginosis may be treated with antibiotic medicines. These may be given in the form of a pill or a vaginal cream. A second round of antibiotics may be prescribed if the condition comes back after treatment.  HOME CARE INSTRUCTIONS   Only take over-the-counter or prescription medicines as  directed by your health care provider.  If antibiotic medicine was prescribed, take it as directed. Make sure you finish it even if you start to feel better.  Do not have sex until treatment is completed.  Tell all sexual partners that you have a vaginal infection. They should see their health care provider and be treated if they have problems, such as a mild rash or itching.  Practice safe sex by using condoms and only having one sex partner. SEEK MEDICAL CARE IF:   Your symptoms are not improving after 3 days of treatment.  You have increased discharge or pain.  You have a fever. MAKE SURE YOU:   Understand these instructions.  Will watch your condition.  Will get help right away if you are not doing well or get worse. FOR MORE INFORMATION  Centers for Disease Control and Prevention, Division of STD Prevention: SolutionApps.co.zawww.cdc.gov/std American Sexual Health Association (ASHA): www.ashastd.org  Document Released: 11/13/2005 Document Revised: 09/03/2013 Document Reviewed: 06/25/2013 [facility] Patient Information 2015 [place], [place]. This information is not intended to replace advice given to you by your health care provider. Make sure you discuss any questions you have with your health care provider.  Preterm Labor Information Preterm labor is when labor starts at less than 37 weeks of pregnancy. The normal length of a pregnancy is 39 to 41 weeks. CAUSES Often, there is no identifiable underlying cause as to why a woman goes into preterm labor. One of the most common known causes of preterm labor is infection. Infections of the uterus, cervix, vagina, amniotic sac, bladder, kidney, or even the lungs (pneumonia) can cause labor to start. Other suspected causes of preterm labor include:  Urogenital infections, such as yeast infections and bacterial vaginosis.   Uterine abnormalities (uterine shape, uterine septum, fibroids, or bleeding from the placenta).   A cervix that has been  operated on (it may fail to stay closed).   Malformations in the fetus.   Multiple gestations (twins, triplets, and so on).   Breakage of the amniotic sac.  RISK FACTORS  Having a previous history of preterm labor.   Having premature rupture of membranes (PROM).   Having a placenta that covers the opening of the cervix (placenta previa).   Having a placenta that separates from the uterus (placental abruption).   Having a cervix that is too weak to hold the fetus in the uterus (incompetent cervix).   Having too much fluid in the amniotic sac (polyhydramnios).   Taking illegal drugs or smoking while pregnant.   Not gaining enough weight while pregnant.   Being younger than 106 and older than 23 years old.   Having a low socioeconomic status.   Being African American. SYMPTOMS Signs and symptoms of preterm labor include:   Menstrual-like cramps, abdominal pain, or back pain.  Uterine contractions that are regular, as frequent as six in an hour, regardless of their intensity (may be mild or painful).  Contractions that start on the top of the uterus and spread down to the lower abdomen and back.   A sense of increased pelvic pressure.   A watery or bloody mucus discharge that comes from the vagina.  TREATMENT Depending on the length of the pregnancy and other circumstances, your health care provider may suggest bed rest. If necessary, there are medicines that can be given to stop contractions and to mature the fetal lungs. If labor happens before 34 weeks of pregnancy, a prolonged hospital stay may be recommended. Treatment depends on the condition of both you and the fetus.  WHAT SHOULD YOU DO IF YOU THINK YOU ARE IN PRETERM LABOR? Call your health care provider right away. You will need to go to the hospital to get checked immediately. HOW CAN YOU PREVENT PRETERM LABOR IN FUTURE PREGNANCIES? You should:   Stop smoking if you smoke.  Maintain healthy  weight gain and avoid chemicals and drugs that are not necessary.  Be watchful for any type of infection.  Inform your health care provider if you have a known history of preterm labor. Document Released: 02/03/2004 Document Revised: 07/16/2013 Document Reviewed: 12/16/2012 [facility] Patient Information 2015 [place], [place]. This information is not intended to replace advice given to you by your health care provider. Make sure you discuss any questions you have with your health care provider.

## 2015-08-08 NOTE — MAU Provider Note (Signed)
History    CSN: [id number] Arrival date and time: 08/08/15 0350 First Provider Initiated Contact with Patient 08/08/15 [phone] , called at 4:56 regarding patient arrival    Chief Complaint  Patient presents with  . Vaginal Discharge   HPI Patient is 24 y.o. [id number] [redacted]w[redacted]d here with complaints of increased mucous. Patient was at work went the bathroom and wiped and has a significant amount of mucous. No blood in mucous. She is worried she lost her mucous plug and she is preterm labor. She reports persistent vagina pressure but this has not changed  Denies contractions.  Reports BV treatment- the occurrence is very different from this one.   Patient has had persistent e coli UTIs this pregnancy and was put on augmentin then macrobid suppression therapy.   +FM, denies LOF, VB, contractions, vaginal discharge.   OB History    Gravida Para Term Preterm AB TAB SAB Ectopic Multiple Living   3 2 1 1      2       Past Medical History  Diagnosis Date  . Preterm labor without delivery in second trimester June 2012    Positive FFN in June  . UTI (lower urinary tract infection)   . Bacterial vaginosis   . Chlamydia   . Migraine   . Preterm labor   . Anemia     Past Surgical History  Procedure Laterality Date  . No past surgeries      Family History  Problem Relation Age of Onset  . Diabetes Other     great granmother  . Diabetes Paternal Grandfather   . Hypertension Paternal Grandfather   . Cancer Paternal Grandfather     prostate    Social History  Substance Use Topics  . Smoking status: Never Smoker   . Smokeless tobacco: Never Used  . Alcohol Use: No    Allergies: No Known Allergies  Prescriptions prior to admission  Medication Sig Dispense Refill Last Dose  . ferrous sulfate 325 (65 FE) MG tablet Take 1 tablet (325 mg total) by mouth 2 (two) times daily with a meal. 60 tablet 3 08/07/2015 at Unknown time  . nitrofurantoin, macrocrystal-monohydrate, (MACROBID) 100 MG  capsule 100 mg at bedtime.   6 08/07/2015 at Unknown time  . Prenat-FeFum-FePo-FA-Omega 3 (CONCEPT DHA) 53.5-38-1 MG CAPS TK 1 C PO D  11 08/07/2015 at Unknown time    [name]  Constitutional: Negative for fever and chills.  Eyes: Negative for blurred vision and double vision.  Respiratory: Negative for cough and shortness of breath.   Cardiovascular: Negative for chest pain.  Gastrointestinal: Negative for nausea and vomiting.  Genitourinary: Negative for dysuria, urgency, frequency, hematuria and flank pain.  Musculoskeletal: Negative for myalgias.  Skin: Negative for rash.  Neurological: Negative for dizziness, tingling, weakness and headaches.  Endo/Heme/Allergies: Does not bruise/bleed easily.  Psychiatric/Behavioral: Negative for depression and suicidal ideas. The patient is not nervous/anxious.    Physical Exam   Blood pressure 121/58, pulse 83, temperature 98.2 F (36.8 C), temperature source Oral, resp. rate 16, height 5\' 7"  (1.702 m), weight 294 lb (133.358 kg), last menstrual period 01/04/2015.  Physical Exam  Nursing note and vitals reviewed. Constitutional: She is oriented to person, place, and time. She appears well-developed and well-nourished. No distress.  Pregnant female  HENT:  Head: Normocephalic and atraumatic.  Eyes: Conjunctivae are normal. No scleral icterus.  Neck: Normal range of motion. Neck supple.  Cardiovascular: Normal rate and intact distal pulses.   Respiratory:  Effort normal. She exhibits no tenderness.  GI: Soft. There is no tenderness. There is no rebound and no guarding.  Gravid  Genitourinary: Vagina normal.  +odor. Thin frothy discharge in vaginal vault. Visually closed.    Musculoskeletal: Normal range of motion. She exhibits no edema.  Neurological: She is alert and oriented to person, place, and time.  Skin: Skin is warm and dry. No rash noted.  Psychiatric: She has a normal mood and affect.   Dilation: 1 Effacement (%):  Thick Cervical Position: Posterior Station: Ballotable Exam by:: Lyndel Safe MD   MAU Course  Procedures  MDM FFN- Negative Wet Prep- Moderate clue cells GC/CT-pending UA- hazy, +keytones, small leukocytes Urine Culture-pending  NST 120/mod/+accels, no decels Toco: quiet  Assessment and Plan  Patricia [name] is a 24 y.o. [phone] at [redacted]w[redacted]d presenting with vaginal discharge and vaginal pressure in the setting of h/o of preterm delivery and recurrent UTIs in pregnancy  #Concern for preterm labor: Do not feel patient needs BMZ at this point as she is not contracting and while she is 1cm dilated she does not appear to be in active labor. FFN was negative which is supportive of this decision. She is on 71 P for her history of preterm delivery and will continue these at her outpatient clinic.  -reviewed preterm labor precautions -Recommended follow up at [facility] Monday, possible BMZ   #Vaginal discharge: ddx includes BV, trichamonas, yeast, GC, CT - Wet prep and exam consistent with BV - Will treat with flagyl PO x7 days - follow up GC/CT results  #FWB: Reactive   Federico Flake 08/08/2015, 5:12 AM

## 2015-08-09 ENCOUNTER — Telehealth: Payer: Self-pay | Admitting: *Deleted

## 2015-08-09 NOTE — Telephone Encounter (Signed)
Pt states was at work and lost her mucus plug, she called the after line nurse and was told to go to [facility] to be evaluated. Pt states went to [facility] and was told she had dilated to a 1 cm and needed to follow up with our office within the next 2 days. Appt made for 08/10/2015 with Dr. Despina Hidden.

## 2015-08-10 ENCOUNTER — Ambulatory Visit (INDEPENDENT_AMBULATORY_CARE_PROVIDER_SITE_OTHER): Payer: Medicaid Other | Admitting: Obstetrics & Gynecology

## 2015-08-10 ENCOUNTER — Encounter: Payer: Self-pay | Admitting: Obstetrics & Gynecology

## 2015-08-10 ENCOUNTER — Telehealth (HOSPITAL_COMMUNITY): Payer: Self-pay | Admitting: *Deleted

## 2015-08-10 VITALS — BP 100/60 | HR 76 | Wt 291.4 lb

## 2015-08-10 DIAGNOSIS — A749 Chlamydial infection, unspecified: Secondary | ICD-10-CM

## 2015-08-10 DIAGNOSIS — Z331 Pregnant state, incidental: Secondary | ICD-10-CM | POA: Diagnosis not present

## 2015-08-10 DIAGNOSIS — Z3A31 31 weeks gestation of pregnancy: Secondary | ICD-10-CM

## 2015-08-10 DIAGNOSIS — O98313 Other infections with a predominantly sexual mode of transmission complicating pregnancy, third trimester: Secondary | ICD-10-CM | POA: Diagnosis not present

## 2015-08-10 DIAGNOSIS — O0993 Supervision of high risk pregnancy, unspecified, third trimester: Secondary | ICD-10-CM

## 2015-08-10 DIAGNOSIS — Z3483 Encounter for supervision of other normal pregnancy, third trimester: Secondary | ICD-10-CM

## 2015-08-10 DIAGNOSIS — Z1389 Encounter for screening for other disorder: Secondary | ICD-10-CM | POA: Diagnosis not present

## 2015-08-10 DIAGNOSIS — O09213 Supervision of pregnancy with history of pre-term labor, third trimester: Secondary | ICD-10-CM

## 2015-08-10 DIAGNOSIS — O09893 Supervision of other high risk pregnancies, third trimester: Secondary | ICD-10-CM

## 2015-08-10 DIAGNOSIS — O98813 Other maternal infectious and parasitic diseases complicating pregnancy, third trimester: Principal | ICD-10-CM

## 2015-08-10 LAB — POCT URINALYSIS DIPSTICK
Blood, UA: NEGATIVE
Glucose, UA: NEGATIVE
Ketones, UA: NEGATIVE
LEUKOCYTES UA: NEGATIVE
NITRITE UA: NEGATIVE
PROTEIN UA: NEGATIVE

## 2015-08-10 LAB — CULTURE, OB URINE: Special Requests: NORMAL

## 2015-08-10 LAB — GC/CHLAMYDIA PROBE AMP (~~LOC~~) NOT AT ARMC
Chlamydia: POSITIVE — AB
NEISSERIA GONORRHEA: NEGATIVE

## 2015-08-10 MED ORDER — AZITHROMYCIN 500 MG PO TABS: ORAL_TABLET | ORAL | Status: DC

## 2015-08-10 NOTE — Progress Notes (Signed)
Fetal Surveillance Testing today:  FHR   High Risk Pregnancy Diagnosis(es):   Preterm labor and delivery  [id number] [redacted]w[redacted]d Estimated Date of Delivery: 10/11/15  Blood pressure 100/60, pulse 76, weight 291 lb 6.4 oz (132.178 kg), last menstrual period 01/04/2015.  Urinalysis: Negative   HPI: The patient is being seen today for ongoing management of preterm labor delivery. Today she reports no contrations   BP weight and urine results all reviewed and noted. Patient reports good fetal movement, denies any bleeding and no rupture of membranes symptoms or regular contractions.  Fundal Height:  34 Fetal Heart rate:  135 Edema:  none  Patient is without complaints other than noted in her HPI. All questions were answered.  All lab and sonogram results have been reviewed. Comments:    Assessment:  1.  Pregnancy at [redacted]w[redacted]d,  Estimated Date of Delivery: 10/11/15 :                          2.  Preterm labor/preterm delivery                        3.  +chlamydia  Medication(s) Plans:  Finished her azithromycin  Treatment Plan:  Cont weekly 17P  Return in about 3 days (around 08/13/2015) for 17 P only. for appointment for high risk OB care  No orders of the defined types were placed in this encounter.   Orders Placed This Encounter  Procedures  . POCT urinalysis dipstick

## 2015-08-10 NOTE — Telephone Encounter (Signed)
Telephone call to patient regarding positive chlamydia culture, patient notified.  Rx routed to patient's pharmacy per protocol.  Instructed patient to notify her partner for treatment and to abstain from sex for seven days post treatment.  Report of treatment faxed to health department.  

## 2015-08-13 ENCOUNTER — Encounter: Payer: Self-pay | Admitting: *Deleted

## 2015-08-13 ENCOUNTER — Ambulatory Visit (INDEPENDENT_AMBULATORY_CARE_PROVIDER_SITE_OTHER): Payer: Medicaid Other | Admitting: *Deleted

## 2015-08-13 VITALS — BP 108/62 | HR 96 | Ht 67.0 in | Wt 290.0 lb

## 2015-08-13 DIAGNOSIS — Z331 Pregnant state, incidental: Secondary | ICD-10-CM

## 2015-08-13 DIAGNOSIS — R102 Pelvic and perineal pain: Secondary | ICD-10-CM

## 2015-08-13 DIAGNOSIS — Z1389 Encounter for screening for other disorder: Secondary | ICD-10-CM | POA: Diagnosis not present

## 2015-08-13 DIAGNOSIS — Z3492 Encounter for supervision of normal pregnancy, unspecified, second trimester: Secondary | ICD-10-CM

## 2015-08-13 DIAGNOSIS — O09893 Supervision of other high risk pregnancies, third trimester: Secondary | ICD-10-CM

## 2015-08-13 DIAGNOSIS — O09213 Supervision of pregnancy with history of pre-term labor, third trimester: Secondary | ICD-10-CM

## 2015-08-13 LAB — POCT URINALYSIS DIPSTICK
Glucose, UA: NEGATIVE
LEUKOCYTES UA: NEGATIVE
NITRITE UA: NEGATIVE
PROTEIN UA: NEGATIVE
RBC UA: NEGATIVE

## 2015-08-13 MED ORDER — HYDROXYPROGESTERONE CAPROATE 250 MG/ML IM OIL
250.0000 mg | TOPICAL_OIL | Freq: Once | INTRAMUSCULAR | Status: AC
  Administered 2015-08-13: 250 mg via INTRAMUSCULAR

## 2015-08-13 NOTE — Progress Notes (Signed)
Pt here for 17P. Pt reports no problems at this time. Return in 1 week for next shot. JSY 

## 2015-08-17 ENCOUNTER — Inpatient Hospital Stay (HOSPITAL_COMMUNITY)
Admission: AD | Admit: 2015-08-17 | Discharge: 2015-08-17 | Disposition: A | Discharge status: Home / Self Care | Payer: Medicaid Other | Source: Ambulatory Visit | Attending: Obstetrics & Gynecology | Admitting: Obstetrics & Gynecology

## 2015-08-17 ENCOUNTER — Encounter (HOSPITAL_COMMUNITY): Payer: Self-pay

## 2015-08-17 DIAGNOSIS — R112 Nausea with vomiting, unspecified: Secondary | ICD-10-CM | POA: Diagnosis present

## 2015-08-17 DIAGNOSIS — O219 Vomiting of pregnancy, unspecified: Secondary | ICD-10-CM

## 2015-08-17 DIAGNOSIS — Z3A32 32 weeks gestation of pregnancy: Secondary | ICD-10-CM | POA: Diagnosis not present

## 2015-08-17 DIAGNOSIS — H9202 Otalgia, left ear: Secondary | ICD-10-CM | POA: Diagnosis not present

## 2015-08-17 DIAGNOSIS — O4703 False labor before 37 completed weeks of gestation, third trimester: Secondary | ICD-10-CM | POA: Insufficient documentation

## 2015-08-17 DIAGNOSIS — O212 Late vomiting of pregnancy: Secondary | ICD-10-CM | POA: Diagnosis not present

## 2015-08-17 LAB — URINALYSIS, ROUTINE W REFLEX MICROSCOPIC
Bilirubin Urine: NEGATIVE
GLUCOSE, UA: NEGATIVE mg/dL
HGB URINE DIPSTICK: NEGATIVE
Ketones, ur: 15 mg/dL — AB
Nitrite: NEGATIVE
PROTEIN: NEGATIVE mg/dL
SPECIFIC GRAVITY, URINE: 1.03 (ref 1.005–1.030)
Urobilinogen, UA: 0.2 mg/dL (ref 0.0–1.0)
pH: 6 (ref 5.0–8.0)

## 2015-08-17 LAB — URINE MICROSCOPIC-ADD ON

## 2015-08-17 MED ORDER — ONDANSETRON 4 MG PO TBDP: 4.0000 mg | ORAL_TABLET | Freq: Three times a day (TID) | ORAL | Status: DC | PRN

## 2015-08-17 NOTE — MAU Note (Signed)
Pt reports vomiting x 2 hours, left ear pain, a little pain in vagina and rectum.

## 2015-08-17 NOTE — Discharge Instructions (Signed)
Use over the counter benadryl for ear pain before bed. No infection present. If develops a fever or throbbing ear pain, go to primary care physician. Take zofran PRN for nausea and increase oral fluid intake.

## 2015-08-17 NOTE — MAU Provider Note (Signed)
History     CSN: [id number]  Arrival date and time: 08/17/15 2003   None     No chief complaint on file.  HPI  Woke up this afternoon for work with left earache, nausea, and started vomiting. Has vomited 5 times since she woke up with afternoon. Is able to tolerate PO now. Still having a little nausea but much better than earlier. Denies any diarrhea. No associated cough, SOB, nasal congestion, had a sinus infection about 1 month ago. Treated for chlamydia on 08/09/15 with azithromycin, has not had intercourse since then. Awaiting TOC. Partner treated as well Braxton hicks contractions every 15-20 started when she got here about 8pm.  +FM, no VB, no abnormal discharge, no LoF  OB History    Gravida Para Term Preterm AB TAB SAB Ectopic Multiple Living   Past Medical History  Diagnosis Date  . Preterm labor without delivery in second trimester June 2012    Positive FFN in June  . UTI (lower urinary tract infection)   . Bacterial vaginosis   . Chlamydia   . Migraine   . Preterm labor   . Anemia     Past Surgical History  Procedure Laterality Date  . No past surgeries      Family History  Problem Relation Age of Onset  . Diabetes Other     great granmother  . Diabetes Paternal Grandfather   . Hypertension Paternal Grandfather   . Cancer Paternal Grandfather     prostate    Social History  Substance Use Topics  . Smoking status: Never Smoker   . Smokeless tobacco: Never Used  . Alcohol Use: No    Allergies: No Known Allergies  Prescriptions prior to admission  Medication Sig Dispense Refill Last Dose  . ferrous sulfate 325 (65 FE) MG tablet Take 1 tablet (325 mg total) by mouth 2 (two) times daily with a meal. 60 tablet 3 Taking  . nitrofurantoin, macrocrystal-monohydrate, (MACROBID) 100 MG capsule 100 mg at bedtime.   6 Taking  . Prenat-FeFum-FePo-FA-Omega 3 (CONCEPT DHA) 53.5-38-1 MG CAPS TK 1 C PO D  11 Taking    ROS Negative except  as listed in HPI Physical Exam   Blood pressure 115/57, pulse 94, temperature 98.8 F (37.1 C), temperature source Oral, resp. rate 16, height  (1.702 m), weight 295 lb (133.811 kg), last menstrual period 01/04/2015, SpO2 100 %.  Physical Exam  Constitutional: She is oriented to person, place, and time. She appears well-developed and well-nourished.  HENT:  Head: Normocephalic and atraumatic.  Right Ear: External ear normal.  Left Ear: External ear normal.  Nose: Nose normal.  Mouth/Throat: Oropharynx is clear and moist.  TM clear, non bulging, no erythema. Serous clear fluid behind b/l TM.  Eyes: Conjunctivae are normal. Pupils are equal, round, and reactive to light.  Cardiovascular: Intact distal pulses.   Respiratory: Effort normal. No respiratory distress.  GI: Soft. Bowel sounds are normal. She exhibits no distension. There is no tenderness. There is no rebound.  Genitourinary: Vagina normal.  Cervix: 1/thick/ballotable/posterior  Neurological: She is alert and oriented to person, place, and time.  Skin: Skin is warm and dry.  Psychiatric: She has a normal mood and affect. Her behavior is normal.    MAU Course  Procedures  MDM FFN done on 08/08/15. EFM: 135/mod var/+accels/no decels, toco: no contractions, occasional uterine irritability  Assessment and Plan  24 yo [id number] here for earache, braxton hicks, and nausea/vomiting -No signs of preterm labor, discussed preterm labor precautions -Take benadryl for serous otitis media before bed, go to PCP if develops fever, chills, throbbing ear pain -Encourage increased PO intake as UA shows dehydration, contributing to braxton hicks. -Sent home with zofran prn for nausea  Durenda Hurt 08/17/2015, 9:51 PM

## 2015-08-18 ENCOUNTER — Telehealth: Payer: Self-pay | Admitting: *Deleted

## 2015-08-18 NOTE — Telephone Encounter (Signed)
Pt states was seen at [facility] last night for earache, Braxton Hick's contractions, nausea and vomiting. Pt states she is not feeling any better this am. Reviewed the note from [facility], pt was given Benadryl at HS for Serous Otitis Media, Zofran for N/V, and encouraged to push fluids for dehydration. No c/o chills, fever, but earache is not improving. Pt informed per Joellyn Haff, CNM needs to take the medication as prescribed from visit from [facility], if no improvement next few days call our office back. Pt verbalized understanding.

## 2015-08-20 ENCOUNTER — Encounter: Payer: Self-pay | Admitting: Women's Health

## 2015-08-20 ENCOUNTER — Ambulatory Visit (INDEPENDENT_AMBULATORY_CARE_PROVIDER_SITE_OTHER): Payer: Medicaid Other | Admitting: Women's Health

## 2015-08-20 ENCOUNTER — Telehealth: Payer: Self-pay | Admitting: Women's Health

## 2015-08-20 VITALS — BP 90/60 | HR 80 | Wt 292.0 lb

## 2015-08-20 DIAGNOSIS — O09213 Supervision of pregnancy with history of pre-term labor, third trimester: Secondary | ICD-10-CM | POA: Diagnosis not present

## 2015-08-20 DIAGNOSIS — O360131 Maternal care for anti-D [Rh] antibodies, third trimester, fetus 1: Secondary | ICD-10-CM

## 2015-08-20 DIAGNOSIS — O98313 Other infections with a predominantly sexual mode of transmission complicating pregnancy, third trimester: Secondary | ICD-10-CM

## 2015-08-20 DIAGNOSIS — Z331 Pregnant state, incidental: Secondary | ICD-10-CM | POA: Diagnosis not present

## 2015-08-20 DIAGNOSIS — Z1389 Encounter for screening for other disorder: Secondary | ICD-10-CM | POA: Diagnosis not present

## 2015-08-20 DIAGNOSIS — Z3A32 32 weeks gestation of pregnancy: Secondary | ICD-10-CM

## 2015-08-20 DIAGNOSIS — A749 Chlamydial infection, unspecified: Secondary | ICD-10-CM | POA: Insufficient documentation

## 2015-08-20 DIAGNOSIS — Z3493 Encounter for supervision of normal pregnancy, unspecified, third trimester: Secondary | ICD-10-CM

## 2015-08-20 DIAGNOSIS — Z3483 Encounter for supervision of other normal pregnancy, third trimester: Secondary | ICD-10-CM

## 2015-08-20 DIAGNOSIS — O98813 Other maternal infectious and parasitic diseases complicating pregnancy, third trimester: Secondary | ICD-10-CM

## 2015-08-20 DIAGNOSIS — R768 Other specified abnormal immunological findings in serum: Secondary | ICD-10-CM

## 2015-08-20 DIAGNOSIS — Z8751 Personal history of pre-term labor: Secondary | ICD-10-CM

## 2015-08-20 LAB — POCT URINALYSIS DIPSTICK
Blood, UA: NEGATIVE
GLUCOSE UA: NEGATIVE
Ketones, UA: NEGATIVE
NITRITE UA: NEGATIVE
Protein, UA: NEGATIVE

## 2015-08-20 MED ORDER — ACYCLOVIR 400 MG PO TABS: 400.0000 mg | ORAL_TABLET | Freq: Three times a day (TID) | ORAL | Status: DC

## 2015-08-20 MED ORDER — HYDROXYPROGESTERONE CAPROATE 250 MG/ML IM OIL
250.0000 mg | TOPICAL_OIL | Freq: Once | INTRAMUSCULAR | Status: AC
  Administered 2015-08-20: 250 mg via INTRAMUSCULAR

## 2015-08-20 NOTE — Addendum Note (Signed)
Addended by: Federico Flake A on: 08/20/2015 10:12 AM   Modules accepted: Orders

## 2015-08-20 NOTE — Telephone Encounter (Signed)
Returned pt's call, answered questions about HSV.  Cheral Marker, CNM, WHNP-BC 08/20/2015 1:20 PM

## 2015-08-20 NOTE — Patient Instructions (Addendum)
Start taking acyclovir 10/3 when you turn 34 weeks  Call the office [phone]) or go to [facility] if:  You begin to have strong, frequent contractions  Your water breaks.  Sometimes it is a big gush of fluid, sometimes it is just a trickle that keeps getting your panties wet or running down your legs  You have vaginal bleeding.  It is normal to have a small amount of spotting if your cervix was checked.   You don't feel your baby moving like normal.  If you don't, get you something to eat and drink and lay down and focus on feeling your baby move.  You should feel at least 10 movements in 2 hours.  If you don't, you should call the office or go to [facility].    Tdap Vaccine  It is recommended that you get the Tdap vaccine during the third trimester of EACH pregnancy to help protect your baby from getting pertussis (whooping cough)  27-36 weeks is the BEST time to do this so that you can pass the protection on to your baby. During pregnancy is better than after pregnancy, but if you are unable to get it during pregnancy it will be offered at the hospital.   You can get this vaccine at the health department or your family doctor  Everyone who will be around your baby should also be up-to-date on their vaccines. Adults (who are not pregnant) only need 1 dose of Tdap during adulthood.     Preterm Labor Information Preterm labor is when labor starts at less than 37 weeks of pregnancy. The normal length of a pregnancy is 39 to 41 weeks. CAUSES Often, there is no identifiable underlying cause as to why a woman goes into preterm labor. One of the most common known causes of preterm labor is infection. Infections of the uterus, cervix, vagina, amniotic sac, bladder, kidney, or even the lungs (pneumonia) can cause labor to start. Other suspected causes of preterm labor include:  8. Urogenital infections, such as yeast infections and bacterial vaginosis.  9. Uterine abnormalities  (uterine shape, uterine septum, fibroids, or bleeding from the placenta).  10. A cervix that has been operated on (it may fail to stay closed).  11. Malformations in the fetus.  12. Multiple gestations (twins, triplets, and so on).  13. Breakage of the amniotic sac.  RISK FACTORS 2. Having a previous history of preterm labor.  3. Having premature rupture of membranes (PROM).  4. Having a placenta that covers the opening of the cervix (placenta previa).  5. Having a placenta that separates from the uterus (placental abruption).  6. Having a cervix that is too weak to hold the fetus in the uterus (incompetent cervix).  7. Having too much fluid in the amniotic sac (polyhydramnios).  8. Taking illegal drugs or smoking while pregnant.  9. Not gaining enough weight while pregnant.  10. Being younger than 57 and older than 23 years old.  11. Having a low socioeconomic status.  12. Being African American. SYMPTOMS Signs and symptoms of preterm labor include:   Menstrual-like cramps, abdominal pain, or back pain.  Uterine contractions that are regular, as frequent as six in an hour, regardless of their intensity (may be mild or painful).  Contractions that start on the top of the uterus and spread down to the lower abdomen and back.   A sense of increased pelvic pressure.   A watery or bloody mucus discharge that comes from the vagina.  TREATMENT Depending on the length of the pregnancy and other circumstances, your health care provider may suggest bed rest. If necessary, there are medicines that can be given to stop contractions and to mature the fetal lungs. If labor happens before 34 weeks of pregnancy, a prolonged hospital stay may be recommended. Treatment depends on the condition of both you and the fetus.  WHAT SHOULD YOU DO IF YOU THINK YOU ARE IN PRETERM LABOR? Call your health care provider right away. You will need to go to the hospital to get checked  immediately. HOW CAN YOU PREVENT PRETERM LABOR IN FUTURE PREGNANCIES? You should:   Stop smoking if you smoke.  Maintain healthy weight gain and avoid chemicals and drugs that are not necessary.  Be watchful for any type of infection.  Inform your health care provider if you have a known history of preterm labor. Document Released: 02/03/2004 Document Revised: 07/16/2013 Document Reviewed: 12/16/2012 [facility] Patient Information 2015 [place], [place]. This information is not intended to replace advice given to you by your health care provider. Make sure you discuss any questions you have with your health care provider.

## 2015-08-20 NOTE — Progress Notes (Signed)
Low-risk OB appointment [id number] [redacted]w[redacted]d Estimated Date of Delivery: 10/11/15 BP 90/60 mmHg  Pulse 80  Wt 292 lb (132.45 kg)  LMP 01/04/2015  BP, weight, and urine reviewed.  Refer to obstetrical flow sheet for FH & FHR.  Reports good fm.  Denies regular uc's, lof, vb, or uti s/s. No complaints. 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 Discussed ptl s/s, fkc. Recommended Tdap at HD/PCP per CDC guidelines.  Plan:  Continue routine obstetrical care  F/U in 2wks for OB appointment and CT POC, weekly for [facility] today

## 2015-08-21 ENCOUNTER — Encounter (HOSPITAL_COMMUNITY): Payer: Self-pay | Admitting: *Deleted

## 2015-08-21 ENCOUNTER — Inpatient Hospital Stay (HOSPITAL_COMMUNITY)
Admission: AD | Admit: 2015-08-21 | Discharge: 2015-08-21 | Disposition: A | Discharge status: Home / Self Care | Payer: Medicaid Other | Source: Ambulatory Visit | Attending: Obstetrics and Gynecology | Admitting: Obstetrics and Gynecology

## 2015-08-21 DIAGNOSIS — Z3A32 32 weeks gestation of pregnancy: Secondary | ICD-10-CM | POA: Insufficient documentation

## 2015-08-21 DIAGNOSIS — O212 Late vomiting of pregnancy: Secondary | ICD-10-CM | POA: Diagnosis present

## 2015-08-21 DIAGNOSIS — A084 Viral intestinal infection, unspecified: Secondary | ICD-10-CM | POA: Insufficient documentation

## 2015-08-21 DIAGNOSIS — O98313 Other infections with a predominantly sexual mode of transmission complicating pregnancy, third trimester: Secondary | ICD-10-CM

## 2015-08-21 DIAGNOSIS — B9689 Other specified bacterial agents as the cause of diseases classified elsewhere: Secondary | ICD-10-CM | POA: Diagnosis not present

## 2015-08-21 DIAGNOSIS — R109 Unspecified abdominal pain: Secondary | ICD-10-CM | POA: Diagnosis not present

## 2015-08-21 DIAGNOSIS — N760 Acute vaginitis: Secondary | ICD-10-CM | POA: Insufficient documentation

## 2015-08-21 DIAGNOSIS — O26893 Other specified pregnancy related conditions, third trimester: Secondary | ICD-10-CM

## 2015-08-21 DIAGNOSIS — Z3490 Encounter for supervision of normal pregnancy, unspecified, unspecified trimester: Secondary | ICD-10-CM

## 2015-08-21 DIAGNOSIS — R197 Diarrhea, unspecified: Secondary | ICD-10-CM

## 2015-08-21 DIAGNOSIS — O23593 Infection of other part of genital tract in pregnancy, third trimester: Secondary | ICD-10-CM | POA: Diagnosis not present

## 2015-08-21 DIAGNOSIS — A499 Bacterial infection, unspecified: Secondary | ICD-10-CM

## 2015-08-21 DIAGNOSIS — O99613 Diseases of the digestive system complicating pregnancy, third trimester: Secondary | ICD-10-CM | POA: Insufficient documentation

## 2015-08-21 DIAGNOSIS — R1110 Vomiting, unspecified: Secondary | ICD-10-CM

## 2015-08-21 DIAGNOSIS — E669 Obesity, unspecified: Secondary | ICD-10-CM

## 2015-08-21 LAB — WET PREP, GENITAL
Trich, Wet Prep: NONE SEEN
YEAST WET PREP: NONE SEEN

## 2015-08-21 LAB — I-STAT CHEM 8, ED
BUN: 3 mg/dL — ABNORMAL LOW (ref 6–20)
CALCIUM ION: 1.2 mmol/L (ref 1.12–1.23)
CHLORIDE: 102 mmol/L (ref 101–111)
Creatinine, Ser: 0.7 mg/dL (ref 0.44–1.00)
Glucose, Bld: 81 mg/dL (ref 65–99)
HCT: 32 % — ABNORMAL LOW (ref 36.0–46.0)
HEMOGLOBIN: 10.9 g/dL — AB (ref 12.0–15.0)
Potassium: 3.5 mmol/L (ref 3.5–5.1)
SODIUM: 137 mmol/L (ref 135–145)
TCO2: 22 mmol/L (ref 0–100)

## 2015-08-21 MED ORDER — METRONIDAZOLE 250 MG PO TABS: 250.0000 mg | ORAL_TABLET | Freq: Two times a day (BID) | ORAL | Status: DC

## 2015-08-21 MED ORDER — SODIUM CHLORIDE 0.9 % IV SOLN: 1000.0000 mL | Freq: Once | INTRAVENOUS | Status: DC

## 2015-08-21 MED ORDER — ONDANSETRON HCL 4 MG/2ML IJ SOLN
4.0000 mg | Freq: Once | INTRAMUSCULAR | Status: AC
  Administered 2015-08-21: 4 mg via INTRAVENOUS
  Filled 2015-08-21: qty 2

## 2015-08-21 MED ORDER — SODIUM CHLORIDE 0.9 % IV SOLN
1000.0000 mL | INTRAVENOUS | Status: DC
  Administered 2015-08-21: 1000 mL via INTRAVENOUS

## 2015-08-21 MED ORDER — SODIUM CHLORIDE 0.9 % IV SOLN
1000.0000 mL | Freq: Once | INTRAVENOUS | Status: AC
Start: 2015-08-21 — End: 2015-08-21
  Administered 2015-08-21: 1000 mL via INTRAVENOUS

## 2015-08-21 NOTE — Progress Notes (Signed)
Dr. Alvester Morin notified pt in MAU, will come see shortly.

## 2015-08-21 NOTE — Progress Notes (Signed)
Requested monitor adjustment. 

## 2015-08-21 NOTE — ED Provider Notes (Signed)
CSN: [id number]     Arrival date & time 08/21/15  0229 History   First MD Initiated Contact with Patient 08/21/15 0300   Chief Complaint  Patient presents with  . Nausea  . Emesis     (Consider location/radiation/quality/duration/timing/severity/associated sxs/prior Treatment) HPI patient is G3 P2 Ab0, she states she is 32 weeks +4 days. Her EDC is November 14. She states she has high risk pregnancy due to preterm labor in her prior two pregnancies. Her first pregnancy however they were able to stop her preterm labor and she delivered at term. The second infant was born at 35 weeks because her water broke. She states she has been doing well. She actually had an  OB visit today to get a 17 P shot (progesterone). She states about 2:30 this afternoon she started feeling bad and started getting vomiting and diarrhea. She states she's vomited about 7 times today and she's had diarrhea about 10. She states the diarrhea is watery and loose. She also states she started having a cough. She has abdominal pain that she describes as a tightening of her abdomen and then getting pain between her vaginal area in her rectal area. She states this is happening every 20-30 minutes. She is not sure if this is labor pain or her Deberah Pelton. She had some white discharge day and also on the 11th. She has been having normal fetal movements. She has not been around anybody else is ill. She has not eaten anything out of the ordinary. She states she is O negative.  She states she went to [facility] this evening and they were initially going to admit her however then they discharged after from the ED. She is supposed to deliver at [facility] in [place].  OB Dr Despina Hidden, next 17 P shot in 1 week, next appointment on October 7  Past Medical History  Diagnosis Date  . Preterm labor without delivery in second trimester June 2012    Positive FFN in June  . UTI (lower urinary tract infection)   . Bacterial  vaginosis   . Chlamydia   . Migraine   . Preterm labor   . Anemia    Past Surgical History  Procedure Laterality Date  . No past surgeries     Family History  Problem Relation Age of Onset  . Diabetes Other     great granmother  . Diabetes Paternal Grandfather   . Hypertension Paternal Grandfather   . Cancer Paternal Grandfather     prostate   Social History  Substance Use Topics  . Smoking status: Never Smoker   . Smokeless tobacco: Never Used  . Alcohol Use: No   Employed  OB History    Gravida Para Term Preterm AB TAB SAB Ectopic Multiple Living   [name]  All other systems reviewed and are negative.     Allergies  Review of patient's allergies indicates no known allergies.  Home Medications   Prior to Admission medications   Medication Sig Start Date End Date Taking? Authorizing Provider  acyclovir (ZOVIRAX) 400 MG tablet Take 1 tablet (400 mg total) by mouth 3 (three) times daily. 08/20/15   Cheral Marker, CNM  ferrous sulfate 325 (65 FE) MG tablet Take 1 tablet (325 mg total) by mouth 2 (two) times daily with a meal. 07/12/15   Cheral Marker, CNM  nitrofurantoin, macrocrystal-monohydrate, (MACROBID) 100 MG capsule  100 mg at bedtime.  06/22/15   Historical Provider, MD  ondansetron (ZOFRAN ODT) 4 MG disintegrating tablet Take 1 tablet (4 mg total) by mouth every 8 (eight) hours as needed for nausea or vomiting. Patient not taking: Reported on 08/20/2015 08/17/15   Durenda Hurt, MD  Prenat-FeFum-FePo-FA-Omega 3 (CONCEPT DHA) 53.5-38-1 MG CAPS TK 1 C PO D 04/02/15   Historical Provider, MD   BP 113/68 mmHg  Pulse 87  Temp(Src) 98.1 F (36.7 C) (Oral)  Resp 18  Ht  (1.702 m)  Wt 292 lb (132.45 kg)  BMI 45.72 kg/m2  SpO2 100%  LMP 01/04/2015  Vital signs normal   Physical Exam  Constitutional: She is oriented to person, place, and time. She appears well-developed and well-nourished.  Non-toxic appearance. She  does not appear ill. No distress.  HENT:  Head: Normocephalic and atraumatic.  Right Ear: External ear normal.  Left Ear: External ear normal.  Nose: Nose normal. No mucosal edema or rhinorrhea.  Mouth/Throat: Oropharynx is clear and moist and mucous membranes are normal. No dental abscesses or uvula swelling.  Eyes: Conjunctivae and EOM are normal. Pupils are equal, round, and reactive to light.  Neck: Normal range of motion and full passive range of motion without pain. Neck supple.  Cardiovascular: Normal rate, regular rhythm and normal heart sounds.  Exam reveals no gallop and no friction rub.   No murmur heard. Pulmonary/Chest: Effort normal and breath sounds normal. No respiratory distress. She has no wheezes. She has no rhonchi. She has no rales. She exhibits no tenderness and no crepitus.  Abdominal: Soft. Normal appearance and bowel sounds are normal. She exhibits no distension. There is no tenderness. There is no rebound and no guarding.  Abdomen consistent with dates, there is fetal movement noted. Heart rate on the monitor is 125-135.  Genitourinary:  Patient has some mild swelling of her genitalia was some mild redness. She has a copious amount of yellow white discharge in her vault. Her cervix is high and out of reach. Nitrazine test is negative.  Musculoskeletal: Normal range of motion. She exhibits no edema or tenderness.  Moves all extremities well.   Neurological: She is alert and oriented to person, place, and time. She has normal strength. No cranial nerve deficit.  Skin: Skin is warm, dry and intact. No rash noted. No erythema. No pallor.  Psychiatric: She has a normal mood and affect. Her speech is normal and behavior is normal. Her mood appears not anxious.  Nursing note and vitals reviewed.   ED Course  Procedures (including critical care time)  Medications  0.9 %  sodium chloride infusion (0 mLs Intravenous Stopped 08/21/15 0621)    Followed by  0.9 %  sodium  chloride infusion (not administered)    Followed by  0.9 %  sodium chloride infusion (1,000 mLs Intravenous New Bag/Given 08/21/15 0622)  ondansetron (ZOFRAN) injection 4 mg (4 mg Intravenous Given 08/21/15 0435)   Patient placed on fetal monitor. This was evaluated at [facility]. They report normal fetal heart rate. They report some contractions that are irregular.  Patient rechecked after getting IV fluids. She states she still having some abdominal pain. We discussed going to [facility] and she states she would feel more comfortable going for further evaluation.  06:17 Dr Adrian Blackwater, OB at [facility] accepts in transfer to [facility]U.    Labs Review  Results for orders placed or performed during the hospital encounter of 08/21/15  Wet prep, genital  Result Value Ref Range   Yeast Wet Prep HPF POC NONE SEEN NONE SEEN   Trich, Wet Prep NONE SEEN NONE SEEN   Clue Cells Wet Prep HPF POC MODERATE (A) NONE SEEN   WBC, Wet Prep HPF POC MODERATE (A) NONE SEEN  I-stat Chem 8, ED  Result Value Ref Range   Sodium 137 135 - 145 mmol/L   Potassium 3.5 3.5 - 5.1 mmol/L   Chloride 102 101 - 111 mmol/L   BUN 3 (L) 6 - 20 mg/dL   Creatinine, Ser 2.72 0.44 - 1.00 mg/dL   Glucose, Bld 81 65 - 99 mg/dL   Calcium, Ion 5.36 1.12 - 1.23 mmol/L   TCO2 22 0 - 100 mmol/L   Hemoglobin 10.9 (L) 12.0 - 15.0 g/dL   HCT 64.4 (L) 03.4 - 74.2 %   Laboratory interpretation all normal except bacterial vaginosis and mild anemia    MDM   Final diagnoses:  BV (bacterial vaginosis)  Pregnancy  Abdominal pain, unspecified abdominal location  Vomiting and diarrhea    Plan transfer to [facility]    Devoria Albe, MD, FACEPDevoria Albe, MD 08/21/15 [phone]

## 2015-08-21 NOTE — ED Notes (Signed)
ED Secretary called EMS for progress update concerning [facility] transfer to [facility]. EMS reported waiting for truck to become available. Pt and Charge RN aware.

## 2015-08-21 NOTE — MAU Provider Note (Signed)
History    CSN: [id number] Arrival date and time: 08/21/15 0229  First Provider Initiated Contact with Patient 08/21/15 [phone]      Chief Complaint  Patient presents with  . Nausea  . Emesis   HPI Patient is 24 y.o. [id number] [redacted]w[redacted]d with a history of preterm delivery here with complaints of nausea, vomitting, diarrhea, and abdominal pain. Reports she saw her MD and was fine and then she was out with her grandmother and had acute onset N/V/diarrhea/abdominal pain.  Went to [facility] in [place] and was admitted. She reports she was having pain between her legs while she was there. She was discharged and then presented to [facility].   She reports the abdominal pain is intermittent in nature every 15-30 minutes.  Reports it gets to an 8 then it will get less.  Reports she is able to drink, not eating well. She was given zofran which has helped Diarrhea, about 10 times, watery in nature.  S/3 L fluids Denies any sick contacts.  Works as a Solicitor at [employer]  +FM, denies LOF, VB, contractions, vaginal discharge.  Chlamydia - reports she was called by MAU and she took Azithromycin.   Last intercourse: 9/19 and 9/20 Last cervical exam check: at [place] [facility]  OB History    Gravida Para Term Preterm AB TAB SAB Ectopic Multiple Living   Past Medical History  Diagnosis Date  . Preterm labor without delivery in second trimester June 2012    Positive FFN in June  . UTI (lower urinary tract infection)   . Bacterial vaginosis   . Chlamydia   . Migraine   . Preterm labor   . Anemia     Past Surgical History  Procedure Laterality Date  . No past surgeries      Family History  Problem Relation Age of Onset  . Diabetes Other     great granmother  . Diabetes Paternal Grandfather   . Hypertension Paternal Grandfather   . Cancer Paternal Grandfather     prostate    Social History  Substance Use Topics  . Smoking status: Never Smoker   . Smokeless tobacco: Never Used   . Alcohol Use: No    Allergies: No Known Allergies  Prescriptions prior to admission  Medication Sig Dispense Refill Last Dose  . ferrous sulfate 325 (65 FE) MG tablet Take 1 tablet (325 mg total) by mouth 2 (two) times daily with a meal. 60 tablet 3 08/20/2015 at Unknown time  . nitrofurantoin, macrocrystal-monohydrate, (MACROBID) 100 MG capsule Take 100 mg by mouth at bedtime.   6 08/20/2015 at Unknown time  . Prenat-FeFum-FePo-FA-Omega 3 (CONCEPT DHA) 53.5-38-1 MG CAPS TK 1 C PO D  11 08/20/2015 at Unknown time  . acyclovir (ZOVIRAX) 400 MG tablet Take 1 tablet (400 mg total) by mouth 3 (three) times daily. (Patient not taking: Reported on 08/21/2015) 90 tablet 3   . ondansetron (ZOFRAN ODT) 4 MG disintegrating tablet Take 1 tablet (4 mg total) by mouth every 8 (eight) hours as needed for nausea or vomiting. (Patient not taking: Reported on 08/20/2015) 20 tablet 0 Not Taking    [name]  Constitutional: Negative for fever and chills.  Eyes: Negative for blurred vision and double vision.  Respiratory: Negative for cough and shortness of breath.   Cardiovascular: Negative for chest pain and orthopnea.  Gastrointestinal: Positive for nausea, vomiting, abdominal pain and diarrhea.  Genitourinary: Negative for dysuria, frequency and flank pain.  Musculoskeletal: Negative for myalgias.  Skin: Negative for rash.  Neurological: Negative for dizziness, tingling, weakness and headaches.  Endo/Heme/Allergies: Does not bruise/bleed easily.  Psychiatric/Behavioral: Negative for depression and suicidal ideas. The patient is not nervous/anxious.    Physical Exam   Blood pressure 101/66, pulse 87, temperature 98.3 F (36.8 C), temperature source Oral, resp. rate 18, height  (1.702 m), weight 292 lb (132.45 kg), last menstrual period 01/04/2015, SpO2 98 %.  Physical Exam  Nursing note and vitals reviewed. Constitutional: She is oriented to person, place, and time. She appears  well-developed and well-nourished. No distress.  Pregnant female. Nasal congestion. No NAD. Able to provide history  HENT:  Head: Normocephalic and atraumatic.  Eyes: Conjunctivae are normal. No scleral icterus.  Neck: Normal range of motion. Neck supple.  Cardiovascular: Normal rate and intact distal pulses.   Respiratory: Effort normal. She exhibits no tenderness.  GI: Soft. She exhibits no distension. There is no tenderness. There is no rebound and no guarding.  Gravid. Hyperactive BS  Genitourinary: Vaginal discharge found.  Musculoskeletal: Normal range of motion. She exhibits no edema.  Neurological: She is alert and oriented to person, place, and time.  Skin: Skin is warm and dry. No rash noted.  Psychiatric: She has a normal mood and affect.    MAU Course  Procedures  MDM Reviewed labs from [facility] prep s/f Moderate Clue cells  NST 135/mod/+accels/no decels Toco irritable  Assessment and Plan  Patricia [name] is a 24 y.o. [id number]   #Abdominal pain in pregnancy/Concern for preterm: Cervix is unchanged on repeat check. Likely abdominal pain is related to intestinal cramping from gastro -no use to FFN given recent CVE at [facility] -Reviewed PTL s/sx  #Viral Gastroenteritis- s/p 3 L, tolerating food, no diarrhea while here -BRAT diet -Zofran prn -Encouraged hydration -letter provided   #Bacterial Vaginosis - Flagyl 250 BID for 7 days - Counseled to start after N/V/diarrhea resolves  Federico Flake 08/21/2015, 9:05 AM

## 2015-08-21 NOTE — ED Notes (Addendum)
Pt reporting nausea, vomiting and diarrhea starting this afternoon.  Reports being [redacted] weeks pregnant, states that she does have sporadic Braxton Hicks contractions.  Last OB appointment this morning.   Pt reporting some discharge, but unsure if she is having fluid leaking.

## 2015-08-21 NOTE — ED Notes (Signed)
[facility]

## 2015-08-21 NOTE — Discharge Instructions (Signed)
Viral Gastroenteritis °Viral gastroenteritis is also known as stomach flu. This condition affects the stomach and intestinal tract. It can cause sudden diarrhea and vomiting. The illness typically lasts 3 to 8 days. Most people develop an immune response that eventually gets rid of the virus. While this natural response develops, the virus can make you quite ill. °CAUSES  °Many different viruses can cause gastroenteritis, such as rotavirus or noroviruses. You can catch one of these viruses by consuming contaminated food or water. You may also catch a virus by sharing utensils or other personal items with an infected person or by touching a contaminated surface. °SYMPTOMS  °The most common symptoms are diarrhea and vomiting. These problems can cause a severe loss of body fluids (dehydration) and a body salt (electrolyte) imbalance. Other symptoms may include: °· Fever. °· Headache. °· Fatigue. °· Abdominal pain. °DIAGNOSIS  °Your caregiver can usually diagnose viral gastroenteritis based on your symptoms and a physical exam. A stool sample may also be taken to test for the presence of viruses or other infections. °TREATMENT  °This illness typically goes away on its own. Treatments are aimed at rehydration. The most serious cases of viral gastroenteritis involve vomiting so severely that you are not able to keep fluids down. In these cases, fluids must be given through an intravenous line (IV). °HOME CARE INSTRUCTIONS  °· Drink enough fluids to keep your urine clear or pale yellow. Drink small amounts of fluids frequently and increase the amounts as tolerated. °· Ask your caregiver for specific rehydration instructions. °· Avoid: °¨ Foods high in sugar. °¨ Alcohol. °¨ Carbonated drinks. °¨ Tobacco. °¨ Juice. °¨ Caffeine drinks. °¨ Extremely hot or cold fluids. °¨ Fatty, greasy foods. °¨ Too much intake of anything at one time. °¨ Dairy products until 24 to 48 hours after diarrhea stops. °· You may consume probiotics.  Probiotics are active cultures of beneficial bacteria. They may lessen the amount and number of diarrheal stools in adults. Probiotics can be found in yogurt with active cultures and in supplements. °· Wash your hands well to avoid spreading the virus. °· Only take over-the-counter or prescription medicines for pain, discomfort, or fever as directed by your caregiver. Do not give aspirin to children. Antidiarrheal medicines are not recommended. °· Ask your caregiver if you should continue to take your regular prescribed and over-the-counter medicines. °· Keep all follow-up appointments as directed by your caregiver. °SEEK IMMEDIATE MEDICAL CARE IF:  °· You are unable to keep fluids down. °· You do not urinate at least once every 6 to 8 hours. °· You develop shortness of breath. °· You notice blood in your stool or vomit. This may look like coffee grounds. °· You have abdominal pain that increases or is concentrated in one small area (localized). °· You have persistent vomiting or diarrhea. °· You have a fever. °· The patient is a child younger than 3 months, and he or she has a fever. °· The patient is a child older than 3 months, and he or she has a fever and persistent symptoms. °· The patient is a child older than 3 months, and he or she has a fever and symptoms suddenly get worse. °· The patient is a baby, and he or she has no tears when crying. °MAKE SURE YOU:  °· Understand these instructions. °· Will watch your condition. °· Will get help right away if you are not doing well or get worse. °Document Released: 11/13/2005 Document Revised: 02/05/2012 Document Reviewed: 08/30/2011 °  [employer] Patient Information 2015 [place]. This information is not intended to replace advice given to you by your health care provider. Make sure you discuss any questions you have with your health care provider.  Food Choices to Help Relieve Diarrhea When you have diarrhea, the foods you eat and your eating habits are very  important. Choosing the right foods and drinks can help relieve diarrhea. Also, because diarrhea can last up to 7 days, you need to replace lost fluids and electrolytes (such as sodium, potassium, and chloride) in order to help prevent dehydration.  WHAT GENERAL GUIDELINES DO I NEED TO FOLLOW?  Slowly drink 1 cup (8 oz) of fluid for each episode of diarrhea. If you are getting enough fluid, your urine will be clear or pale yellow.  Eat starchy foods. Some good choices include white rice, white toast, pasta, low-fiber cereal, baked potatoes (without the skin), saltine crackers, and bagels.  Avoid large servings of any cooked vegetables.  Limit fruit to two servings per day. A serving is  cup or 1 small piece.  Choose foods with less than 2 g of fiber per serving.  Limit fats to less than 8 tsp (38 g) per day.  Avoid fried foods.  Eat foods that have probiotics in them. Probiotics can be found in certain dairy products.  Avoid foods and beverages that may increase the speed at which food moves through the stomach and intestines (gastrointestinal tract). Things to avoid include:  High-fiber foods, such as dried fruit, raw fruits and vegetables, nuts, seeds, and whole grain foods.  Spicy foods and high-fat foods.  Foods and beverages sweetened with high-fructose corn syrup, honey, or sugar alcohols such as xylitol, sorbitol, and mannitol. WHAT FOODS ARE RECOMMENDED? Grains White rice. White, [place], or pita breads (fresh or toasted), including plain rolls, buns, or bagels. White pasta. Saltine, soda, or graham crackers. Pretzels. Low-fiber cereal. Cooked cereals made with water (such as cornmeal, farina, or cream cereals). Plain muffins. Matzo. Melba toast. Zwieback.  Vegetables Potatoes (without the skin). Strained tomato and vegetable juices. Most well-cooked and canned vegetables without seeds. Tender lettuce. Fruits Cooked or canned applesauce, apricots, cherries, fruit cocktail,  grapefruit, peaches, pears, or plums. Fresh bananas, apples without skin, cherries, grapes, cantaloupe, grapefruit, peaches, oranges, or plums.  Meat and Other Protein Products Baked or boiled chicken. Eggs. Tofu. Fish. Seafood. Smooth peanut butter. Ground or well-cooked tender beef, ham, veal, lamb, pork, or poultry.  Dairy Plain yogurt, kefir, and unsweetened liquid yogurt. Lactose-free milk, buttermilk, or soy milk. Plain hard cheese. Beverages Sport drinks. Clear broths. Diluted fruit juices (except prune). Regular, caffeine-free sodas such as ginger ale. Water. Decaffeinated teas. Oral rehydration solutions. Sugar-free beverages not sweetened with sugar alcohols. Other Bouillon, broth, or soups made from recommended foods.  The items listed above may not be a complete list of recommended foods or beverages. Contact your dietitian for more options. WHAT FOODS ARE NOT RECOMMENDED? Grains Whole grain, whole wheat, bran, or rye breads, rolls, pastas, crackers, and cereals. Wild or brown rice. Cereals that contain more than 2 g of fiber per serving. Corn tortillas or taco shells. Cooked or dry oatmeal. Granola. Popcorn. Vegetables Raw vegetables. Cabbage, broccoli, Brussels sprouts, artichokes, baked beans, beet greens, corn, kale, legumes, peas, sweet potatoes, and yams. Potato skins. Cooked spinach and cabbage. Fruits Dried fruit, including raisins and dates. Raw fruits. Stewed or dried prunes. Fresh apples with skin, apricots, mangoes, pears, raspberries, and strawberries.  Meat and Other Protein Products Chunky peanut  butter. Nuts and seeds. Beans and lentils. Tomasa Blase.  Dairy High-fat cheeses. Milk, chocolate milk, and beverages made with milk, such as milk shakes. Cream. Ice cream. Sweets and Desserts Sweet rolls, doughnuts, and sweet breads. Pancakes and waffles. Fats and Oils Butter. Cream sauces. Margarine. Salad oils. Plain salad dressings. Olives. Avocados.  Beverages Caffeinated  beverages (such as coffee, tea, soda, or energy drinks). Alcoholic beverages. Fruit juices with pulp. Prune juice. Soft drinks sweetened with high-fructose corn syrup or sugar alcohols. Other Coconut. Hot sauce. Chili powder. Mayonnaise. Gravy. Cream-based or milk-based soups.  The items listed above may not be a complete list of foods and beverages to avoid. Contact your dietitian for more information. WHAT SHOULD I DO IF I BECOME DEHYDRATED? Diarrhea can sometimes lead to dehydration. Signs of dehydration include dark urine and dry mouth and skin. If you think you are dehydrated, you should rehydrate with an oral rehydration solution. These solutions can be purchased at pharmacies, retail stores, or online.  Drink -1 cup (120-240 mL) of oral rehydration solution each time you have an episode of diarrhea. If drinking this amount makes your diarrhea worse, try drinking smaller amounts more often. For example, drink 1-3 tsp (5-15 mL) every 5-10 minutes.  A general rule for staying hydrated is to drink 1-2 L of fluid per day. Talk to your health care provider about the specific amount you should be drinking each day. Drink enough fluids to keep your urine clear or pale yellow. Document Released: 02/03/2004 Document Revised: 11/18/2013 Document Reviewed: 10/06/2013 [facility] Patient Information 2015 [place], [place]. This information is not intended to replace advice given to you by your health care provider. Make sure you discuss any questions you have with your health care provider.   Bacterial Vaginosis Bacterial vaginosis is a vaginal infection that occurs when the normal balance of bacteria in the vagina is disrupted. It results from an overgrowth of certain bacteria. This is the most common vaginal infection in women of childbearing age. Treatment is important to prevent complications, especially in pregnant women, as it can cause a premature delivery. CAUSES  Bacterial vaginosis is caused by an  increase in harmful bacteria that are normally present in smaller amounts in the vagina. Several different kinds of bacteria can cause bacterial vaginosis. However, the reason that the condition develops is not fully understood. RISK FACTORS Certain activities or behaviors can put you at an increased risk of developing bacterial vaginosis, including:  Having a new sex partner or multiple sex partners.  Douching.  Using an intrauterine device (IUD) for contraception. Women do not get bacterial vaginosis from toilet seats, bedding, swimming pools, or contact with objects around them. SIGNS AND SYMPTOMS  Some women with bacterial vaginosis have no signs or symptoms. Common symptoms include:  Grey vaginal discharge.  A fishlike odor with discharge, especially after sexual intercourse.  Itching or burning of the vagina and vulva.  Burning or pain with urination. DIAGNOSIS  Your health care provider will take a medical history and examine the vagina for signs of bacterial vaginosis. A sample of vaginal fluid may be taken. Your health care provider will look at this sample under a microscope to check for bacteria and abnormal cells. A vaginal pH test may also be done.  TREATMENT  Bacterial vaginosis may be treated with antibiotic medicines. These may be given in the form of a pill or a vaginal cream. A second round of antibiotics may be prescribed if the condition comes back after treatment.  HOME  CARE INSTRUCTIONS   Only take over-the-counter or prescription medicines as directed by your health care provider.  If antibiotic medicine was prescribed, take it as directed. Make sure you finish it even if you start to feel better.  Do not have sex until treatment is completed.  Tell all sexual partners that you have a vaginal infection. They should see their health care provider and be treated if they have problems, such as a mild rash or itching.  Practice safe sex by using condoms and only  having one sex partner. SEEK MEDICAL CARE IF:   Your symptoms are not improving after 3 days of treatment.  You have increased discharge or pain.  You have a fever. MAKE SURE YOU:   Understand these instructions.  Will watch your condition.  Will get help right away if you are not doing well or get worse. FOR MORE INFORMATION  Centers for Disease Control and Prevention, Division of STD Prevention: SolutionApps.co.za American Sexual Health Association (ASHA): www.ashastd.org  Document Released: 11/13/2005 Document Revised: 09/03/2013 Document Reviewed: 06/25/2013 [facility] Patient Information 2015 [place], [place]. This information is not intended to replace advice given to you by your health care provider. Make sure you discuss any questions you have with your health care provider.

## 2015-08-26 ENCOUNTER — Ambulatory Visit (INDEPENDENT_AMBULATORY_CARE_PROVIDER_SITE_OTHER): Payer: Medicaid Other | Admitting: Obstetrics & Gynecology

## 2015-08-26 ENCOUNTER — Encounter: Payer: Self-pay | Admitting: Obstetrics & Gynecology

## 2015-08-26 ENCOUNTER — Telehealth: Payer: Self-pay | Admitting: Obstetrics & Gynecology

## 2015-08-26 VITALS — BP 110/60 | HR 72 | Wt 290.0 lb

## 2015-08-26 DIAGNOSIS — Z3A33 33 weeks gestation of pregnancy: Secondary | ICD-10-CM

## 2015-08-26 DIAGNOSIS — Z331 Pregnant state, incidental: Secondary | ICD-10-CM | POA: Diagnosis not present

## 2015-08-26 DIAGNOSIS — Z1389 Encounter for screening for other disorder: Secondary | ICD-10-CM

## 2015-08-26 DIAGNOSIS — R102 Pelvic and perineal pain: Secondary | ICD-10-CM | POA: Diagnosis not present

## 2015-08-26 DIAGNOSIS — O26893 Other specified pregnancy related conditions, third trimester: Principal | ICD-10-CM

## 2015-08-26 DIAGNOSIS — N949 Unspecified condition associated with female genital organs and menstrual cycle: Secondary | ICD-10-CM

## 2015-08-26 DIAGNOSIS — Z3483 Encounter for supervision of other normal pregnancy, third trimester: Secondary | ICD-10-CM | POA: Diagnosis not present

## 2015-08-26 DIAGNOSIS — O09213 Supervision of pregnancy with history of pre-term labor, third trimester: Secondary | ICD-10-CM | POA: Diagnosis not present

## 2015-08-26 LAB — POCT URINALYSIS DIPSTICK
Glucose, UA: NEGATIVE
NITRITE UA: NEGATIVE
Protein, UA: NEGATIVE
RBC UA: NEGATIVE

## 2015-08-26 NOTE — Progress Notes (Signed)
Work in ob visit  [redacted]w[redacted]d Estimated Date of Delivery: 10/11/15 with complaint of pelvic pressure and pain after working her 12 hour shift No bleeding no ROM no regular contraction  Cervix 1/th/-2, baby dipping into pelvis  Follow up as scheduled for 17P tomorrow and appointment next week

## 2015-08-26 NOTE — Telephone Encounter (Signed)
Pt states worked last night, woke up this am went to restroom and "really hard to walk, pain abdominal area" a lot of pain and pressure, +FM, no vaginal bleeding. Pt states took tylenol for headache before laying down. Pt states "just want to make sure everything is all right." Pt given an appt today for evaluation.

## 2015-08-26 NOTE — Addendum Note (Signed)
Addended by: Federico Flake A on: 08/26/2015 03:31 PM   Modules accepted: Orders

## 2015-08-27 ENCOUNTER — Encounter: Payer: Self-pay | Admitting: *Deleted

## 2015-08-27 ENCOUNTER — Ambulatory Visit (INDEPENDENT_AMBULATORY_CARE_PROVIDER_SITE_OTHER): Payer: Medicaid Other | Admitting: *Deleted

## 2015-08-27 VITALS — BP 100/60 | HR 88 | Ht 67.0 in | Wt 294.5 lb

## 2015-08-27 DIAGNOSIS — Z331 Pregnant state, incidental: Secondary | ICD-10-CM

## 2015-08-27 DIAGNOSIS — Z1389 Encounter for screening for other disorder: Secondary | ICD-10-CM

## 2015-08-27 DIAGNOSIS — O09893 Supervision of other high risk pregnancies, third trimester: Secondary | ICD-10-CM

## 2015-08-27 DIAGNOSIS — R102 Pelvic and perineal pain: Secondary | ICD-10-CM

## 2015-08-27 DIAGNOSIS — O09213 Supervision of pregnancy with history of pre-term labor, third trimester: Secondary | ICD-10-CM

## 2015-08-27 DIAGNOSIS — Z3493 Encounter for supervision of normal pregnancy, unspecified, third trimester: Secondary | ICD-10-CM

## 2015-08-27 LAB — POCT URINALYSIS DIPSTICK
Blood, UA: NEGATIVE
Glucose, UA: NEGATIVE
LEUKOCYTES UA: NEGATIVE
NITRITE UA: NEGATIVE
PROTEIN UA: NEGATIVE

## 2015-08-27 MED ORDER — HYDROXYPROGESTERONE CAPROATE 250 MG/ML IM OIL
250.0000 mg | TOPICAL_OIL | Freq: Once | INTRAMUSCULAR | Status: AC
  Administered 2015-08-27: 250 mg via INTRAMUSCULAR

## 2015-08-27 NOTE — Addendum Note (Signed)
Addended by: Colen Darling on: 08/27/2015 12:02 PM   Modules accepted: Orders

## 2015-08-27 NOTE — Progress Notes (Signed)
Pt here for 17P. Reports no problems at this time. Return in 1 week for next shot. JSY 

## 2015-08-31 ENCOUNTER — Encounter (HOSPITAL_COMMUNITY): Payer: Self-pay | Admitting: *Deleted

## 2015-08-31 ENCOUNTER — Inpatient Hospital Stay (HOSPITAL_COMMUNITY)
Admission: AD | Admit: 2015-08-31 | Discharge: 2015-08-31 | Disposition: A | Discharge status: Home / Self Care | Payer: Medicaid Other | Source: Ambulatory Visit | Attending: Family Medicine | Admitting: Family Medicine

## 2015-08-31 DIAGNOSIS — N898 Other specified noninflammatory disorders of vagina: Secondary | ICD-10-CM

## 2015-08-31 DIAGNOSIS — O26893 Other specified pregnancy related conditions, third trimester: Secondary | ICD-10-CM | POA: Diagnosis not present

## 2015-08-31 DIAGNOSIS — O26899 Other specified pregnancy related conditions, unspecified trimester: Secondary | ICD-10-CM

## 2015-08-31 DIAGNOSIS — Z3A34 34 weeks gestation of pregnancy: Secondary | ICD-10-CM | POA: Diagnosis not present

## 2015-08-31 LAB — WET PREP, GENITAL
CLUE CELLS WET PREP: NONE SEEN
TRICH WET PREP: NONE SEEN
YEAST WET PREP: NONE SEEN

## 2015-08-31 LAB — URINALYSIS, ROUTINE W REFLEX MICROSCOPIC
BILIRUBIN URINE: NEGATIVE
GLUCOSE, UA: NEGATIVE mg/dL
Hgb urine dipstick: NEGATIVE
KETONES UR: 15 mg/dL — AB
NITRITE: NEGATIVE
PH: 6 (ref 5.0–8.0)
PROTEIN: NEGATIVE mg/dL
Specific Gravity, Urine: >1.03 — ABNORMAL HIGH (ref 1.005–1.030)
Urobilinogen, UA: 0.2 mg/dL (ref 0.0–1.0)

## 2015-08-31 LAB — URINE MICROSCOPIC-ADD ON

## 2015-08-31 LAB — POCT FERN TEST: POCT FERN TEST: NEGATIVE

## 2015-08-31 LAB — AMNISURE RUPTURE OF MEMBRANE (ROM) NOT AT ARMC: Amnisure ROM: NEGATIVE

## 2015-08-31 NOTE — MAU Provider Note (Signed)
MAU HISTORY AND PHYSICAL  Chief Complaint:  Vaginal discharge  Patricia [name] is a 24 y.o.  [id number] with IUP at [redacted]w[redacted]d presenting for the above.  This afternoon noticed more discharge than normal in her underwear. Nothing soaking through pants or trickling down leg. No vaginal pain or burning or itching. No dysuria or hematuria. No recent sex. Patient states she has been having  none contractions, none vaginal bleeding, {with active fetal movement.    Past Medical History  Diagnosis Date  . Preterm labor without delivery in second trimester June 2012    Positive FFN in June  . UTI (lower urinary tract infection)   . Bacterial vaginosis   . Chlamydia   . Migraine   . Preterm labor   . Anemia     Past Surgical History  Procedure Laterality Date  . No past surgeries      Family History  Problem Relation Age of Onset  . Diabetes Other     great granmother  . Diabetes Paternal Grandfather   . Hypertension Paternal Grandfather   . Cancer Paternal Grandfather     prostate  . Miscarriages / Stillbirths Maternal Grandmother     Social History  Substance Use Topics  . Smoking status: Never Smoker   . Smokeless tobacco: Never Used  . Alcohol Use: No    No Known Allergies  Prescriptions prior to admission  Medication Sig Dispense Refill Last Dose  . acyclovir (ZOVIRAX) 400 MG tablet Take 1 tablet (400 mg total) by mouth 3 (three) times daily. 90 tablet 3 08/30/2015 at Unknown time  . ferrous sulfate 325 (65 FE) MG tablet Take 1 tablet (325 mg total) by mouth 2 (two) times daily with a meal. 60 tablet 3 08/30/2015 at Unknown time  . metroNIDAZOLE (FLAGYL) 250 MG tablet Take 1 tablet (250 mg total) by mouth 2 (two) times daily. 14 tablet 0 Past Month at Unknown time  . nitrofurantoin, macrocrystal-monohydrate, (MACROBID) 100 MG capsule Take 100 mg by mouth at bedtime.   6 08/30/2015 at Unknown time  . ondansetron (ZOFRAN ODT) 4 MG disintegrating tablet Take 1 tablet (4 mg total) by  mouth every 8 (eight) hours as needed for nausea or vomiting. 20 tablet 0 Past Week at Unknown time  . Prenat-FeFum-FePo-FA-Omega 3 (CONCEPT DHA) 53.5-38-1 MG CAPS TK 1 C PO D  11 08/30/2015 at Unknown time    [name] - Negative except for what is mentioned in HPI.  Physical Exam  Blood pressure 100/49, pulse 88, temperature 98.6 F (37 C), temperature source Oral, resp. rate 18, height  (1.702 m), weight 295 lb 2 oz (133.868 kg), last menstrual period 01/04/2015. GENERAL: Well-developed, well-nourished female in no acute distress.  LUNGS: Clear to auscultation bilaterally.  HEART: Regular rate and rhythm. ABDOMEN: Soft, nontender, nondistended, gravid.  EXTREMITIES: Nontender, no edema, 2+ distal pulses. FHT:  125/mod/+a/-d Contractions: rare   Labs: Results for orders placed or performed during the hospital encounter of 08/31/15 (from the past 24 hour(s))  Urinalysis, Routine w reflex microscopic (not at [facility])   Collection Time: 08/31/15  2:40 AM  Result Value Ref Range   Color, Urine YELLOW YELLOW   APPearance CLEAR CLEAR   Specific Gravity, Urine >1.030 (H) 1.005 - 1.030   pH 6.0 5.0 - 8.0   Glucose, UA NEGATIVE NEGATIVE mg/dL   Hgb urine dipstick NEGATIVE NEGATIVE   Bilirubin Urine NEGATIVE NEGATIVE   Ketones, ur 15 (A) NEGATIVE mg/dL   Protein, ur  NEGATIVE NEGATIVE mg/dL   Urobilinogen, UA 0.2 0.0 - 1.0 mg/dL   Nitrite NEGATIVE NEGATIVE   Leukocytes, UA SMALL (A) NEGATIVE  Urine microscopic-add on   Collection Time: 08/31/15  2:40 AM  Result Value Ref Range   Squamous Epithelial / LPF FEW (A) RARE   WBC, UA 3-6 <3 WBC/hpf   Bacteria, UA FEW (A) RARE   Crystals CA OXALATE CRYSTALS (A) NEGATIVE   Urine-Other MUCOUS PRESENT   Wet prep, genital   Collection Time: 08/31/15  3:30 AM  Result Value Ref Range   Yeast Wet Prep HPF POC NONE SEEN NONE SEEN   Trich, Wet Prep NONE SEEN NONE SEEN   Clue Cells Wet Prep HPF POC NONE SEEN NONE SEEN   WBC, Wet Prep  HPF POC MODERATE (A) NONE SEEN  Amnisure rupture of membrane (rom)not at [facility]   Collection Time: 08/31/15  3:30 AM  Result Value Ref Range   Amnisure ROM NEGATIVE   Fern Test   Collection Time: 08/31/15  3:38 AM  Result Value Ref Range   POCT Fern Test Negative = intact amniotic membranes     Imaging Studies:  No results found.  Assessment: Patricia [name] is  24 y.o. [phone] at [redacted]w[redacted]d presents with vaginal discharge. Story not convincing for rupture of membranes. While I was in surgery RN obtained amnisure, which was negative, in addition to negative fern. Wet prep also unremarkable, and urinalysis not suggestive of infection. No contractions and reactive NST; do not think this PTL. Likely physiologic discharge of pregnancy, possibly stress incontinence also playing a role.  Plan: - d/c home with PPROM and PTL return precautions, and OB f/u this Friday as scheduled  Cherrie Gauze Wouk 10/4/20164:58 AM

## 2015-08-31 NOTE — MAU Note (Signed)
Pt to have amnisure and wet prep, results to be reported to faculty.

## 2015-08-31 NOTE — MAU Note (Signed)
Dr. Ashok Pall to come see patient and aware that amnisure is negative.

## 2015-08-31 NOTE — Discharge Instructions (Signed)

## 2015-08-31 NOTE — MAU Note (Signed)
Pt states that she began to feel wet at 0030 and thinks that she may have ruptured membranes. Pt. wore a pantyliner in and it was wet but did not have an initial gush of fluid. Pt states that baby is active and denies bleeding.

## 2015-09-01 ENCOUNTER — Telehealth: Payer: Self-pay | Admitting: *Deleted

## 2015-09-01 NOTE — Telephone Encounter (Signed)
Pt informed of Joellyn Haff, CNM recommendations and verbalized understanding.

## 2015-09-01 NOTE — Telephone Encounter (Signed)
Pt c/o constant  pain in vaginal area and buttocks was seen at [facility] 08/31/2015, trouble sleeping, taking tylenol not helping. Is there alternative pain med she can take?

## 2015-09-03 ENCOUNTER — Encounter: Payer: Self-pay | Admitting: Obstetrics & Gynecology

## 2015-09-03 ENCOUNTER — Ambulatory Visit (INDEPENDENT_AMBULATORY_CARE_PROVIDER_SITE_OTHER): Payer: Medicaid Other | Admitting: Obstetrics & Gynecology

## 2015-09-03 VITALS — BP 100/60 | HR 80 | Wt 293.0 lb

## 2015-09-03 DIAGNOSIS — Z3493 Encounter for supervision of normal pregnancy, unspecified, third trimester: Secondary | ICD-10-CM

## 2015-09-03 DIAGNOSIS — Z3483 Encounter for supervision of other normal pregnancy, third trimester: Secondary | ICD-10-CM

## 2015-09-03 DIAGNOSIS — Z3A34 34 weeks gestation of pregnancy: Secondary | ICD-10-CM

## 2015-09-03 DIAGNOSIS — Z331 Pregnant state, incidental: Secondary | ICD-10-CM | POA: Diagnosis not present

## 2015-09-03 DIAGNOSIS — O09213 Supervision of pregnancy with history of pre-term labor, third trimester: Secondary | ICD-10-CM | POA: Diagnosis not present

## 2015-09-03 DIAGNOSIS — Z8751 Personal history of pre-term labor: Secondary | ICD-10-CM

## 2015-09-03 DIAGNOSIS — Z1389 Encounter for screening for other disorder: Secondary | ICD-10-CM | POA: Diagnosis not present

## 2015-09-03 LAB — POCT URINALYSIS DIPSTICK
Glucose, UA: NEGATIVE
KETONES UA: NEGATIVE
Leukocytes, UA: NEGATIVE
Nitrite, UA: NEGATIVE
PROTEIN UA: NEGATIVE
RBC UA: NEGATIVE

## 2015-09-03 MED ORDER — HYDROXYPROGESTERONE CAPROATE 250 MG/ML IM OIL
250.0000 mg | TOPICAL_OIL | Freq: Once | INTRAMUSCULAR | Status: AC
  Administered 2015-09-03: 250 mg via INTRAMUSCULAR

## 2015-09-03 NOTE — Addendum Note (Signed)
Addended by: Criss Alvine on: 09/03/2015 09:37 AM   Modules accepted: Orders

## 2015-09-03 NOTE — Progress Notes (Signed)
[id number] [redacted]w[redacted]d Estimated Date of Delivery: 10/11/15  Blood pressure 100/60, pulse 80, weight 293 lb (132.904 kg), last menstrual period 01/04/2015.   BP weight and urine results all reviewed and noted.  Please refer to the obstetrical flow sheet for the fundal height and fetal heart rate documentation:  Patient reports good fetal movement, denies any bleeding and no rupture of membranes symptoms or regular contractions. Patient is without complaints. All questions were answered.  No orders of the defined types were placed in this encounter.    Plan:  Continued routine obstetrical care,   Return in about 10 days (around 09/13/2015) for LROB + 17 P.

## 2015-09-05 IMAGING — CR DG CHEST 2V
2 series · 2 of 2 positions shown · non-contrast
Comparison: Chest radiograph performed 02/15/2012

CLINICAL DATA: Cough, chest pain and pressure.

EXAM:
CHEST  2 VIEW

[view not recorded (1 of 2)]
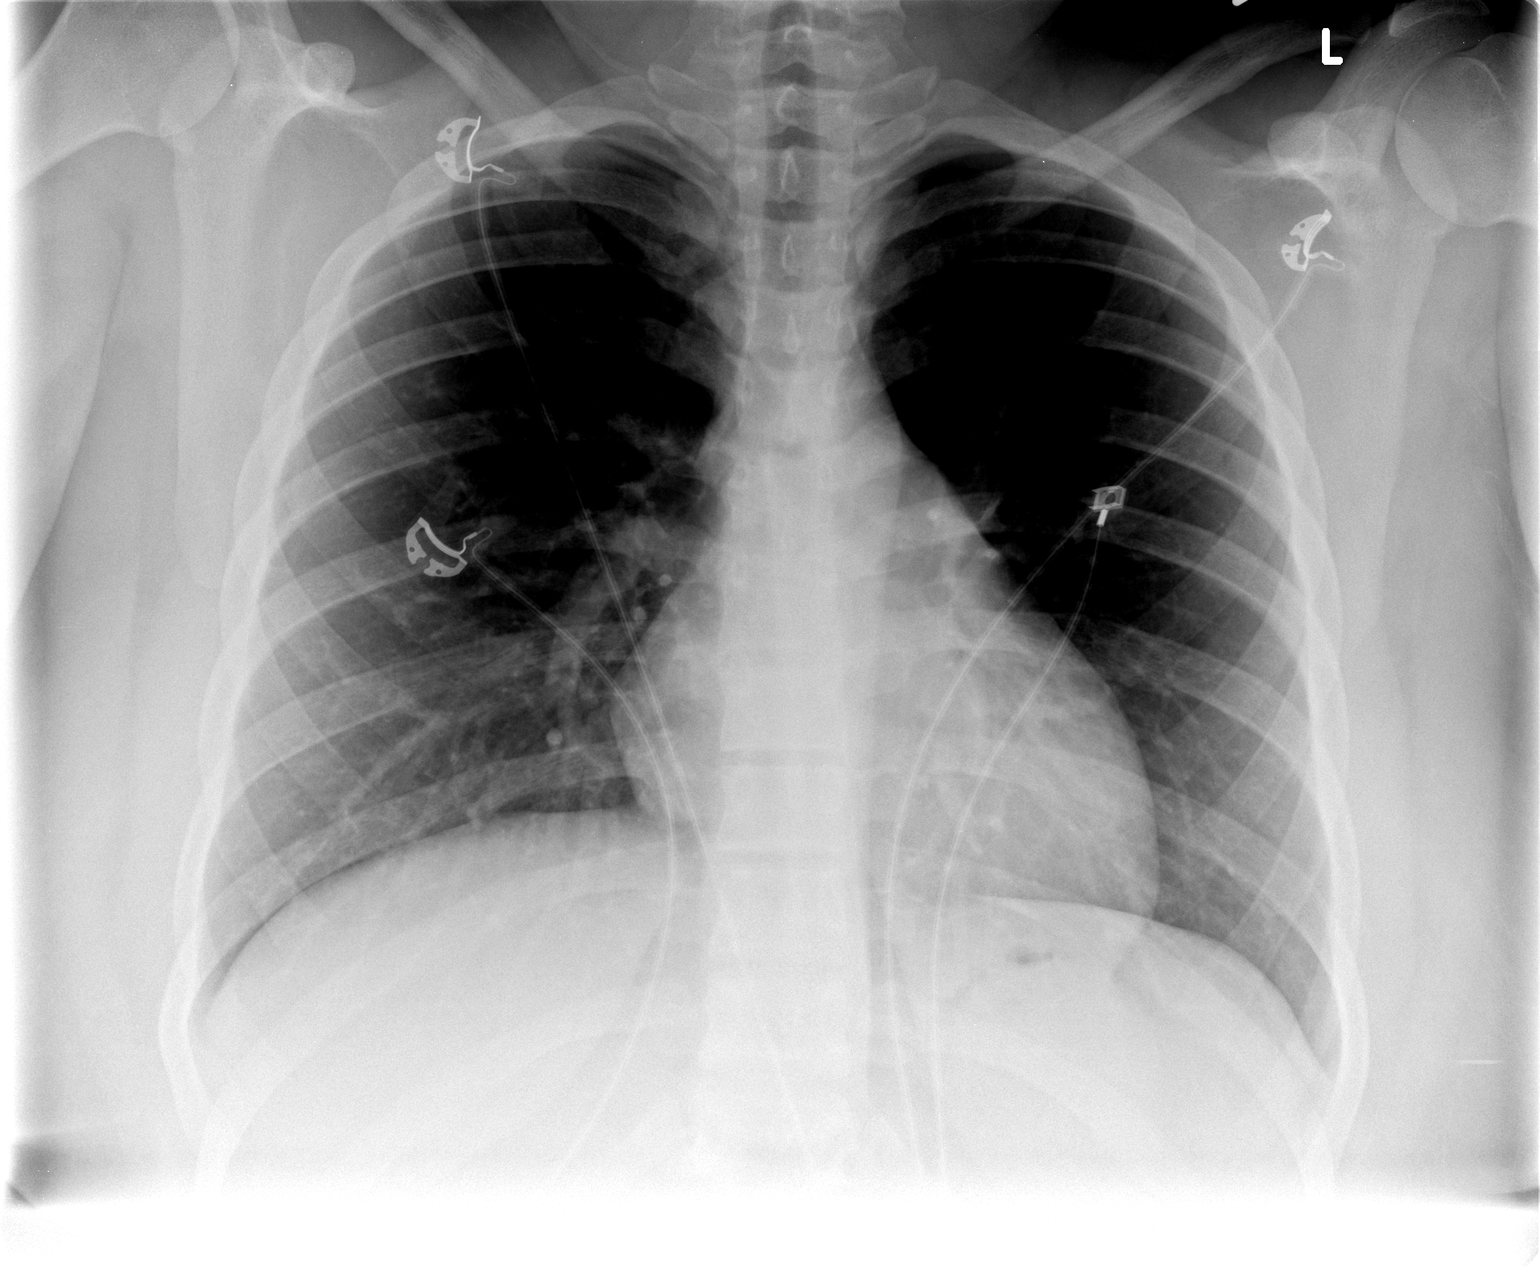

[view not recorded (2 of 2)]
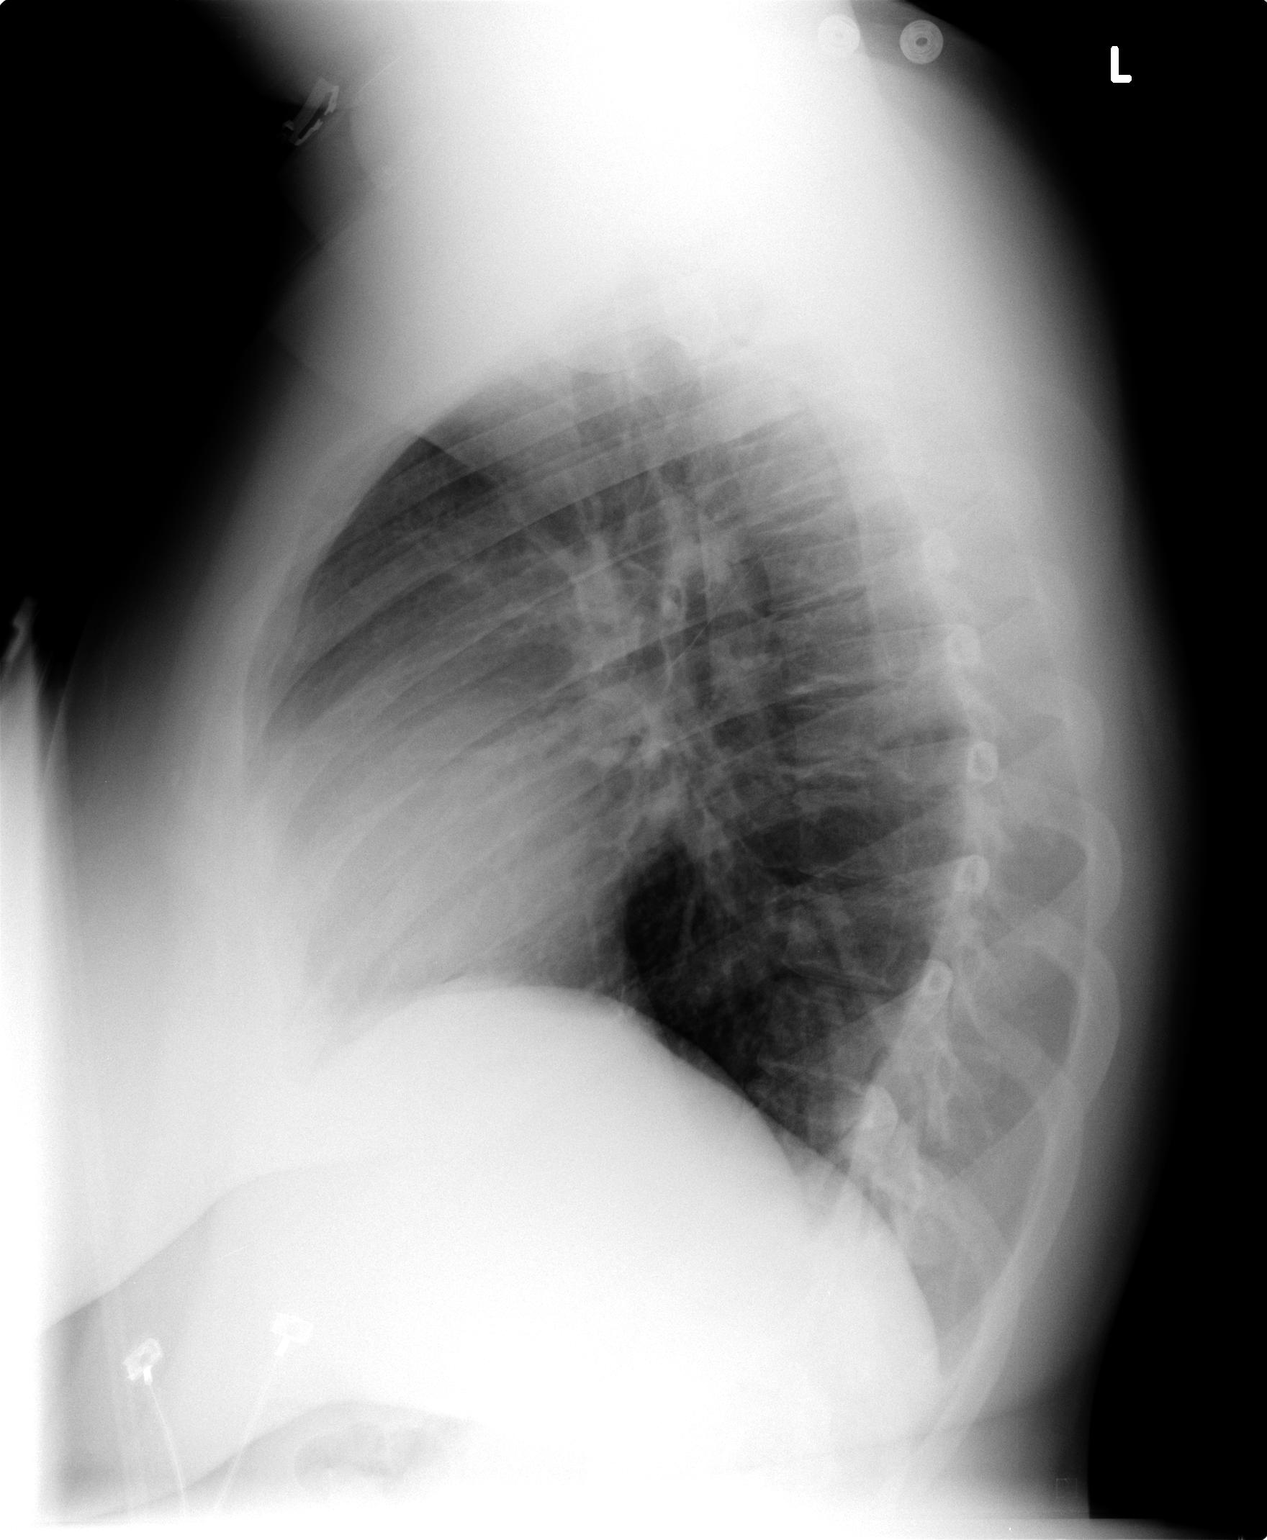

[2 of 2 positions shown; findings below may reference images not displayed]

FINDINGS: The lungs are well-aerated and clear. There is no evidence of focal
opacification, pleural effusion or pneumothorax.

The heart is normal in size; the mediastinal contour is within
normal limits. No acute osseous abnormalities are seen.
IMPRESSION: No acute cardiopulmonary process seen.

## 2015-09-08 ENCOUNTER — Ambulatory Visit (INDEPENDENT_AMBULATORY_CARE_PROVIDER_SITE_OTHER): Payer: Medicaid Other | Admitting: Advanced Practice Midwife

## 2015-09-08 ENCOUNTER — Telehealth: Payer: Self-pay | Admitting: *Deleted

## 2015-09-08 VITALS — BP 90/52 | HR 90 | Wt 298.0 lb

## 2015-09-08 DIAGNOSIS — O09213 Supervision of pregnancy with history of pre-term labor, third trimester: Secondary | ICD-10-CM | POA: Diagnosis not present

## 2015-09-08 DIAGNOSIS — A749 Chlamydial infection, unspecified: Secondary | ICD-10-CM | POA: Diagnosis not present

## 2015-09-08 DIAGNOSIS — O98313 Other infections with a predominantly sexual mode of transmission complicating pregnancy, third trimester: Secondary | ICD-10-CM | POA: Diagnosis not present

## 2015-09-08 DIAGNOSIS — Z3A35 35 weeks gestation of pregnancy: Secondary | ICD-10-CM

## 2015-09-08 DIAGNOSIS — Z3483 Encounter for supervision of other normal pregnancy, third trimester: Secondary | ICD-10-CM

## 2015-09-08 DIAGNOSIS — Z3493 Encounter for supervision of normal pregnancy, unspecified, third trimester: Secondary | ICD-10-CM

## 2015-09-08 NOTE — Progress Notes (Signed)
[id number] Estimated Date of Delivery: 10/11/15  Blood pressure 90/52, pulse 90, weight 298 lb (135.172 kg), last menstrual period 01/04/2015.  WORK IN FOR VAGINAL PAIN, "WET DOWN THERE".   BP weight and urine results all reviewed and noted.  Please refer to the obstetrical flow sheet for the fundal height and fetal heart rate documentation:  Patient reports good fetal movement, denies any bleeding and no rupture of membranes symptoms or regular contractions. All complaints are c/w musculoskeletal/ligament pains of pregnancy.  SSE:  Normal appearing dc.  Wet prep negative.  Fern, valsalva neg.  Outer os 3 cms/long.  Couldn't get to inner os without causing pt too much pain. US verified Vtx. All questions were answered.  Orders Placed This Encounter  Procedures  . GC/Chlamydia Probe Amp    Plan:  Continued routine obstetrical care, kinesiology taping handout given. POC chlamydia sent  Return for As scheduled, LROB.

## 2015-09-08 NOTE — Patient Instructions (Signed)
"  kinesiology taping for pregnancy"

## 2015-09-08 NOTE — Telephone Encounter (Signed)
Pt states that she has been having since last Thursday. Pt states that Dr.Eure told her that it was the position of the baby. Pt states that its not just abdominal pain but also vaginal pain. Pt states that she has tried everything recommended but nothing is working. Pt states that she is having constant vaginal pain with any position. Pt denies any gush of fluid or bleeding. Pt states that "it stays moist down there". Pt states that she has good fetal movement. Pt was given an appointment for today to be checked.

## 2015-09-09 LAB — GC/CHLAMYDIA PROBE AMP
Chlamydia trachomatis, NAA: NEGATIVE
Neisseria gonorrhoeae by PCR: NEGATIVE

## 2015-09-10 ENCOUNTER — Inpatient Hospital Stay (HOSPITAL_COMMUNITY)
Admission: AD | Admit: 2015-09-10 | Discharge: 2015-09-11 | Disposition: A | Discharge status: Home / Self Care | Payer: Medicaid Other | Source: Ambulatory Visit | Attending: Obstetrics and Gynecology | Admitting: Obstetrics and Gynecology

## 2015-09-10 ENCOUNTER — Encounter (HOSPITAL_COMMUNITY): Payer: Self-pay | Admitting: *Deleted

## 2015-09-10 DIAGNOSIS — R102 Pelvic and perineal pain: Secondary | ICD-10-CM

## 2015-09-10 DIAGNOSIS — Z3A35 35 weeks gestation of pregnancy: Secondary | ICD-10-CM | POA: Insufficient documentation

## 2015-09-10 DIAGNOSIS — O26893 Other specified pregnancy related conditions, third trimester: Secondary | ICD-10-CM | POA: Insufficient documentation

## 2015-09-10 DIAGNOSIS — Z3493 Encounter for supervision of normal pregnancy, unspecified, third trimester: Secondary | ICD-10-CM

## 2015-09-10 LAB — URINALYSIS, ROUTINE W REFLEX MICROSCOPIC
Bilirubin Urine: NEGATIVE
Glucose, UA: NEGATIVE mg/dL
Hgb urine dipstick: NEGATIVE
Ketones, ur: NEGATIVE mg/dL
NITRITE: NEGATIVE
PH: 6 (ref 5.0–8.0)
Protein, ur: NEGATIVE mg/dL
SPECIFIC GRAVITY, URINE: 1.025 (ref 1.005–1.030)
UROBILINOGEN UA: 0.2 mg/dL (ref 0.0–1.0)

## 2015-09-10 LAB — URINE MICROSCOPIC-ADD ON

## 2015-09-10 NOTE — MAU Note (Signed)
Vaginal pain for 2wks. Getting 17P injections. 3cm this past Weds. Vaginal pain is getting worse. Worse when sitting and trying to get up. Denies bleeding or LOF

## 2015-09-11 DIAGNOSIS — R102 Pelvic and perineal pain: Secondary | ICD-10-CM | POA: Diagnosis not present

## 2015-09-11 MED ORDER — OXYCODONE-ACETAMINOPHEN 5-325 MG PO TABS
2.0000 | ORAL_TABLET | Freq: Once | ORAL | Status: AC
  Administered 2015-09-11: 2 via ORAL
  Filled 2015-09-11: qty 2

## 2015-09-11 NOTE — Discharge Instructions (Signed)
Braxton Hicks Contractions °Contractions of the uterus can occur throughout pregnancy. Contractions are not always a sign that you are in labor.  °WHAT ARE BRAXTON HICKS CONTRACTIONS?  °Contractions that occur before labor are called Braxton Hicks contractions, or false labor. Toward the end of pregnancy (32-34 weeks), these contractions can develop more often and may become more forceful. This is not true labor because these contractions do not result in opening (dilatation) and thinning of the cervix. They are sometimes difficult to tell apart from true labor because these contractions can be forceful and people have different pain tolerances. You should not feel embarrassed if you go to the hospital with false labor. Sometimes, the only way to tell if you are in true labor is for your health care provider to look for changes in the cervix. °If there are no prenatal problems or other health problems associated with the pregnancy, it is completely safe to be sent home with false labor and await the onset of true labor. °HOW CAN YOU TELL THE DIFFERENCE BETWEEN TRUE AND FALSE LABOR? °False Labor °· The contractions of false labor are usually shorter and not as hard as those of true labor.   °· The contractions are usually irregular.   °· The contractions are often felt in the front of the lower abdomen and in the groin.   °· The contractions may go away when you walk around or change positions while lying down.   °· The contractions get weaker and are shorter lasting as time goes on.   °· The contractions do not usually become progressively stronger, regular, and closer together as with true labor.   °True Labor °· Contractions in true labor last 30-70 seconds, become very regular, usually become more intense, and increase in frequency.   °· The contractions do not go away with walking.   °· The discomfort is usually felt in the top of the uterus and spreads to the lower abdomen and low back.   °· True labor can be  determined by your health care provider with an exam. This will show that the cervix is dilating and getting thinner.   °WHAT TO REMEMBER °· Keep up with your usual exercises and follow other instructions given by your health care provider.   °· Take medicines as directed by your health care provider.   °· Keep your regular prenatal appointments.   °· Eat and drink lightly if you think you are going into labor.   °· If Braxton Hicks contractions are making you uncomfortable:   °¨ Change your position from lying down or resting to walking, or from walking to resting.   °¨ Sit and rest in a tub of warm water.   °¨ Drink 2-3 glasses of water. Dehydration may cause these contractions.   °¨ Do slow and deep breathing several times an hour.   °WHEN SHOULD I SEEK IMMEDIATE MEDICAL CARE? °Seek immediate medical care if: °· Your contractions become stronger, more regular, and closer together.   °· You have fluid leaking or gushing from your vagina.   °· You have a fever.   °· You pass blood-tinged mucus.   °· You have vaginal bleeding.   °· You have continuous abdominal pain.   °· You have low back pain that you never had before.   °· You feel your baby's head pushing down and causing pelvic pressure.   °· Your baby is not moving as much as it used to.   °  °This information is not intended to replace advice given to you by your health care provider. Make sure you discuss any questions you have with your health care   provider. °  °Document Released: 11/13/2005 Document Revised: 11/18/2013 Document Reviewed: 08/25/2013 °Elsevier Interactive Patient Education ©2016 Elsevier Inc. ° °

## 2015-09-11 NOTE — MAU Provider Note (Signed)
History     CSN: [id number]  Arrival date and time: 09/10/15 2226   None     Chief Complaint  Patient presents with  . Vaginal Pain   HPI Patricia [name] is a 24yo [id number] .5wks presenting for further eval of sharp, internal vag pain. States this remains mostly constant, but seems to increase in nature every 5 mins or so. Is worse when she tries to get up or move her legs. Has tried preg support belt, warm baths, and Tylenol. Denies leaking or bldg (had neg workup for vag infection and ROM on 10/12). Is not sure if these are contractions. Reports +FM. No N/V/D. 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 Preterm labor without delivery in second trimester June 2012    Positive FFN in June  . UTI (lower urinary tract infection)   . Bacterial vaginosis   . Chlamydia   . Migraine   . Preterm labor   . Anemia     Past Surgical History  Procedure Laterality Date  . No past surgeries      Family History  Problem Relation Age of Onset  . Diabetes Other     great granmother  . Diabetes Paternal Grandfather   . Hypertension Paternal Grandfather   . Cancer Paternal Grandfather     prostate  . Miscarriages / Stillbirths Maternal Grandmother     Social History  Substance Use Topics  . Smoking status: Never Smoker   . Smokeless tobacco: Never Used  . Alcohol Use: No    Allergies: No Known Allergies  Prescriptions prior to admission  Medication Sig Dispense Refill Last Dose  . acyclovir (ZOVIRAX) 400 MG tablet Take 1 tablet (400 mg total) by mouth 3 (three) times daily. 90 tablet 3 09/10/2015 at Unknown time  . ferrous sulfate 325 (65 FE) MG tablet Take 1 tablet (325 mg total) by mouth 2 (two) times daily with a meal. 60 tablet 3 09/10/2015 at Unknown time  .  metroNIDAZOLE (FLAGYL) 250 MG tablet Take 1 tablet (250 mg total) by mouth 2 (two) times daily. 14 tablet 0 Past Month at Unknown time  . nitrofurantoin, macrocrystal-monohydrate, (MACROBID) 100 MG capsule Take 100 mg by mouth at bedtime.   6 09/10/2015 at Unknown time  . ondansetron (ZOFRAN ODT) 4 MG disintegrating tablet Take 1 tablet (4 mg total) by mouth every 8 (eight) hours as needed for nausea or vomiting. 20 tablet 0 Past Month at Unknown time  . Prenat-FeFum-FePo-FA-Omega 3 (CONCEPT DHA) 53.5-38-1 MG CAPS TK 1 C PO D  11 09/10/2015 at Unknown time    ROS no other pertinents other than what is in HPI  Physical Exam   Blood pressure 123/71, pulse 90, temperature 98.2 F (36.8 C), resp. rate 18, height  (1.702 m), weight 133.085 kg (293 lb 6.4 oz), last menstrual period 01/04/2015.  Physical Exam  Constitutional: She is oriented to person, place, and time. She appears well-developed.  HENT:  Head: Normocephalic.  Neck: Normal range of motion.  Cardiovascular: Normal rate.   Respiratory: Effort normal.  GI:  EFM 120s, +accels, no decels One ctx on toco in >1hr  of time  Genitourinary:  Cx ext os loose 2/thick/high (unchanged from 10/12) No abnormalities seen in vag, no d/c  Musculoskeletal: Normal range of motion.  Neurological: She is alert and oriented to person, place, and time.  Skin: Skin is warm and dry.  Psychiatric: She has a normal mood and affect. Her behavior is normal. Thought content normal.   Urinalysis    Component Value Date/Time   COLORURINE YELLOW 09/10/2015 2308   APPEARANCEUR CLEAR 09/10/2015 2308   LABSPEC 1.025 09/10/2015 2308   PHURINE 6.0 09/10/2015 2308   GLUCOSEU NEGATIVE 09/10/2015 2308   HGBUR NEGATIVE 09/10/2015 2308   BILIRUBINUR NEGATIVE 09/10/2015 2308   BILIRUBINUR Negative 03/04/2015 0918   KETONESUR NEGATIVE 09/10/2015 2308   PROTEINUR NEGATIVE 09/10/2015 2308   PROTEINUR neg 09/03/2015 0937   UROBILINOGEN 0.2 09/10/2015 2308    NITRITE NEGATIVE 09/10/2015 2308   NITRITE neg 09/03/2015 0937   NITRITE Negative 03/04/2015 0918   LEUKOCYTESUR MODERATE* 09/10/2015 2308   LEUKOCYTESUR Negative 03/04/2015 0918   Micro: few bacteria   MAU Course  Procedures  MDM UA ordered Cx exam NST read  Percocet x 2 given in MAU- some relief noted  Assessment and Plan  IUP@35 .5wks Vaginal pain in preg  D/C home w/ comfort meas rev'd F/U as scheduled at next visit  Cam HaiSHAW, KIMBERLY CNM 09/11/2015, 12:30 AM

## 2015-09-12 LAB — CULTURE, OB URINE

## 2015-09-13 ENCOUNTER — Encounter: Payer: Self-pay | Admitting: Obstetrics & Gynecology

## 2015-09-13 ENCOUNTER — Ambulatory Visit (INDEPENDENT_AMBULATORY_CARE_PROVIDER_SITE_OTHER): Payer: Medicaid Other | Admitting: Obstetrics & Gynecology

## 2015-09-13 VITALS — BP 100/60 | HR 70 | Wt 291.0 lb

## 2015-09-13 DIAGNOSIS — O09213 Supervision of pregnancy with history of pre-term labor, third trimester: Secondary | ICD-10-CM | POA: Diagnosis not present

## 2015-09-13 DIAGNOSIS — Z331 Pregnant state, incidental: Secondary | ICD-10-CM

## 2015-09-13 DIAGNOSIS — Z3483 Encounter for supervision of other normal pregnancy, third trimester: Secondary | ICD-10-CM | POA: Diagnosis not present

## 2015-09-13 DIAGNOSIS — Z1389 Encounter for screening for other disorder: Secondary | ICD-10-CM

## 2015-09-13 DIAGNOSIS — Z3A36 36 weeks gestation of pregnancy: Secondary | ICD-10-CM

## 2015-09-13 DIAGNOSIS — Z3493 Encounter for supervision of normal pregnancy, unspecified, third trimester: Secondary | ICD-10-CM

## 2015-09-13 LAB — POCT URINALYSIS DIPSTICK
GLUCOSE UA: NEGATIVE
Ketones, UA: NEGATIVE
NITRITE UA: NEGATIVE
Protein, UA: NEGATIVE
RBC UA: NEGATIVE

## 2015-09-13 NOTE — Progress Notes (Signed)
[id number] 8240w0d Estimated Date of Delivery: 10/11/15  Blood pressure 100/60, pulse 70, weight 291 lb (131.997 kg), last menstrual period 01/04/2015.   BP weight and urine results all reviewed and noted.  Please refer to the obstetrical flow sheet for the fundal height and fetal heart rate documentation:  Patient reports good fetal movement, denies any bleeding and no rupture of membranes symptoms or regular contractions. Patient is without complaints. All questions were answered.  Orders Placed This Encounter  Procedures  . POCT urinalysis dipstick    Plan:  Continued routine obstetrical care, no 17P today  No Follow-up on file.

## 2015-09-15 ENCOUNTER — Telehealth: Payer: Self-pay | Admitting: *Deleted

## 2015-09-15 NOTE — Telephone Encounter (Signed)
Pt states had red to dark brown, quarter size spotting in undergarment, none since and none noted when pt wipes, +FM, no gush of fluids. Per [name] Booker, CNM pt to continue to monitor if reoccurs to call our office back, no sex for 7 days. Pt has an appt for 09/20/2015. Pt verbalized understanding.

## 2015-09-20 ENCOUNTER — Encounter: Payer: Self-pay | Admitting: Women's Health

## 2015-09-20 ENCOUNTER — Ambulatory Visit (INDEPENDENT_AMBULATORY_CARE_PROVIDER_SITE_OTHER): Payer: Medicaid Other | Admitting: Women's Health

## 2015-09-20 VITALS — BP 104/60 | HR 70 | Wt 292.0 lb

## 2015-09-20 DIAGNOSIS — Z1389 Encounter for screening for other disorder: Secondary | ICD-10-CM

## 2015-09-20 DIAGNOSIS — Z331 Pregnant state, incidental: Secondary | ICD-10-CM | POA: Diagnosis not present

## 2015-09-20 DIAGNOSIS — O09213 Supervision of pregnancy with history of pre-term labor, third trimester: Secondary | ICD-10-CM

## 2015-09-20 DIAGNOSIS — Z3A37 37 weeks gestation of pregnancy: Secondary | ICD-10-CM

## 2015-09-20 DIAGNOSIS — Z3493 Encounter for supervision of normal pregnancy, unspecified, third trimester: Secondary | ICD-10-CM | POA: Diagnosis not present

## 2015-09-20 DIAGNOSIS — Z3685 Encounter for antenatal screening for Streptococcus B: Secondary | ICD-10-CM

## 2015-09-20 LAB — POCT URINALYSIS DIPSTICK
GLUCOSE UA: NEGATIVE
KETONES UA: NEGATIVE
Nitrite, UA: NEGATIVE
RBC UA: NEGATIVE

## 2015-09-20 LAB — OB RESULTS CONSOLE GBS: STREP GROUP B AG: NEGATIVE

## 2015-09-20 NOTE — Patient Instructions (Addendum)
[facility]: [phone]  Call the office [phone]) or go to [facility] if:  You begin to have strong, frequent contractions  Your water breaks.  Sometimes it is a big gush of fluid, sometimes it is just a trickle that keeps getting your panties wet or running down your legs  You have vaginal bleeding.  It is normal to have a small amount of spotting if your cervix was checked.   You don't feel your baby moving like normal.  If you don't, get you something to eat and drink and lay down and focus on feeling your baby move.  You should feel at least 10 movements in 2 hours.  If you don't, you should call the office or go to [facility].    [facility] Contractions Contractions of the uterus can occur throughout pregnancy. Contractions are not always a sign that you are in labor.  WHAT ARE BRAXTON HICKS CONTRACTIONS?  Contractions that occur before labor are called Braxton Hicks contractions, or false labor. Toward the end of pregnancy (32-34 weeks), these contractions can develop more often and may become more forceful. This is not true labor because these contractions do not result in opening (dilatation) and thinning of the cervix. They are sometimes difficult to tell apart from true labor because these contractions can be forceful and people have different pain tolerances. You should not feel embarrassed if you go to the hospital with false labor. Sometimes, the only way to tell if you are in true labor is for your health care provider to look for changes in the cervix. If there are no prenatal problems or other health problems associated with the pregnancy, it is completely safe to be sent home with false labor and await the onset of true labor. HOW CAN YOU TELL THE DIFFERENCE BETWEEN TRUE AND FALSE LABOR? False Labor  The contractions of false labor are usually shorter and not as hard as those of true labor.   The contractions are usually irregular.   The contractions  are often felt in the front of the lower abdomen and in the groin.   The contractions may go away when you walk around or change positions while lying down.   The contractions get weaker and are shorter lasting as time goes on.   The contractions do not usually become progressively stronger, regular, and closer together as with true labor.  True Labor  Contractions in true labor last 30-70 seconds, become very regular, usually become more intense, and increase in frequency.   The contractions do not go away with walking.   The discomfort is usually felt in the top of the uterus and spreads to the lower abdomen and low back.   True labor can be determined by your health care provider with an exam. This will show that the cervix is dilating and getting thinner.  WHAT TO REMEMBER  Keep up with your usual exercises and follow other instructions given by your health care provider.   Take medicines as directed by your health care provider.   Keep your regular prenatal appointments.   Eat and drink lightly if you think you are going into labor.   If Braxton Hicks contractions are making you uncomfortable:   Change your position from lying down or resting to walking, or from walking to resting.   Sit and rest in a tub of warm water.   Drink 2-3 glasses of water. Dehydration may cause these contractions.   Do slow and deep breathing  several times an hour.  WHEN SHOULD I SEEK IMMEDIATE MEDICAL CARE? Seek immediate medical care if:  Your contractions become stronger, more regular, and closer together.   You have fluid leaking or gushing from your vagina.   You have a fever.   You pass blood-tinged mucus.   You have vaginal bleeding.   You have continuous abdominal pain.   You have low back pain that you never had before.   You feel your baby's head pushing down and causing pelvic pressure.   Your baby is not moving as much as it used to.    This  information is not intended to replace advice given to you by your health care provider. Make sure you discuss any questions you have with your health care provider.   Document Released: 11/13/2005 Document Revised: 11/18/2013 Document Reviewed: 08/25/2013 Elsevier Interactive Patient Education [employer]2016 Elsevier Inc.

## 2015-09-20 NOTE — Progress Notes (Signed)
Low-risk OB appointment [id number] 6930w0d Estimated Date of Delivery: 10/11/15 BP 104/60 mmHg  Pulse 70  Wt 292 lb (132.45 kg)  LMP 01/04/2015  BP, weight, and urine reviewed.  Refer to obstetrical flow sheet for FH & FHR.  Reports good fm.  Denies regular uc's, lof, vb, or uti s/s. No complaints. Has been taking all 3 acyclovir at night before going to bed b/c of convenience- to take tid as directed. Answered many questions. Wants to have hsv2 repeated pp. Was neg 8571yrs ago, had high titer 2mths ago as well as recent CT infection- hers is likely truly positive- but she wants to recheck for verification.  Wants BTL- discussed high incidence of regret at <30yo, nexplanon/IUD just as effective- pt wants IUD GBS collected SVE per request: outer os 4/inner os tight 3, thick, ballotable, vtx Reviewed labor s/s, fkc. Plan:  Continue routine obstetrical care  F/U in 1wk for OB appointment  GBS today, had neg gc/ct on 10/12

## 2015-09-23 ENCOUNTER — Encounter (HOSPITAL_COMMUNITY): Payer: Self-pay | Admitting: *Deleted

## 2015-09-23 ENCOUNTER — Inpatient Hospital Stay (HOSPITAL_COMMUNITY)
Admission: AD | Admit: 2015-09-23 | Discharge: 2015-09-23 | Disposition: A | Discharge status: Home / Self Care | Payer: Medicaid Other | Source: Ambulatory Visit | Attending: Obstetrics & Gynecology | Admitting: Obstetrics & Gynecology

## 2015-09-23 DIAGNOSIS — R102 Pelvic and perineal pain: Secondary | ICD-10-CM | POA: Insufficient documentation

## 2015-09-23 DIAGNOSIS — Z3A37 37 weeks gestation of pregnancy: Secondary | ICD-10-CM | POA: Insufficient documentation

## 2015-09-23 DIAGNOSIS — O26893 Other specified pregnancy related conditions, third trimester: Secondary | ICD-10-CM | POA: Insufficient documentation

## 2015-09-23 MED ORDER — OXYCODONE-ACETAMINOPHEN 5-325 MG PO TABS
1.0000 | ORAL_TABLET | Freq: Once | ORAL | Status: AC
  Administered 2015-09-23: 1 via ORAL
  Filled 2015-09-23: qty 1

## 2015-09-23 MED ORDER — ZOLPIDEM TARTRATE 5 MG PO TABS: 5.0000 mg | ORAL_TABLET | Freq: Every evening | ORAL | Status: DC | PRN

## 2015-09-23 MED ORDER — ZOLPIDEM TARTRATE 5 MG PO TABS
5.0000 mg | ORAL_TABLET | Freq: Once | ORAL | Status: AC
  Administered 2015-09-23: 5 mg via ORAL
  Filled 2015-09-23: qty 1

## 2015-09-23 NOTE — MAU Note (Signed)
Pt states contractions started about an hour ago and are every 7-10 mins. Denies LOF or vag bleeding. +FM. Was on 17P injections.

## 2015-09-23 NOTE — MAU Note (Signed)
Pt report that she has felt vaginal pain for months. It hurts to move, walk, ect. Denies bleeding or leaking. + FM.

## 2015-09-23 NOTE — MAU Note (Signed)
Spoke with provider about patient. Provider said she would put in discharge instructions and that patient could be discharged.

## 2015-09-23 NOTE — MAU Provider Note (Signed)
History     CSN: [id number]  Arrival date and time: 09/23/15 0354   None     Chief Complaint  Patient presents with  . Contractions   HPI Patricia [name] is a 24yo [id number] @ 37.3wks presents via EMS for further eval of sharp, internal vag pain. States this remains mostly constant but increased this evening. Is worse with moving in bed or getting up. She has tried support measures and has been seen frequently w/ this same discomfort and nothing seems to help. Neg workup for vag infection. Also w/ irreg ctx; denies leaking or bldg. 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 Preterm labor without delivery in second trimester June 2012    Positive FFN in June  . UTI (lower urinary tract infection)   . Bacterial vaginosis   . Chlamydia   . Migraine   . Preterm labor   . Anemia     Past Surgical History  Procedure Laterality Date  . No past surgeries      Family History  Problem Relation Age of Onset  . Diabetes Other     great granmother  . Diabetes Paternal Grandfather   . Hypertension Paternal Grandfather   . Cancer Paternal Grandfather     prostate  . Miscarriages / Stillbirths Maternal Grandmother     Social History  Substance Use Topics  . Smoking status: Never Smoker   . Smokeless tobacco: Never Used  . Alcohol Use: No    Allergies: No Known Allergies  Prescriptions prior to admission  Medication Sig Dispense Refill Last Dose  . acyclovir (ZOVIRAX) 400 MG tablet Take 1 tablet (400 mg total) by mouth 3 (three) times daily. 90 tablet 3 09/22/2015 at Unknown time  . ferrous sulfate 325 (65 FE) MG tablet Take 1 tablet (325 mg total) by mouth 2 (two) times daily with a meal. 60 tablet 3 09/22/2015 at Unknown time  . nitrofurantoin, macrocrystal-monohydrate,  (MACROBID) 100 MG capsule Take 100 mg by mouth 2 (two) times daily.   09/22/2015 at Unknown time  . Prenat-FeFum-FePo-FA-Omega 3 (CONCEPT DHA) 53.5-38-1 MG CAPS TK 1 C PO D  11 09/22/2015 at Unknown time  . metroNIDAZOLE (FLAGYL) 250 MG tablet Take 1 tablet (250 mg total) by mouth 2 (two) times daily. (Patient not taking: Reported on 09/13/2015) 14 tablet 0 Not Taking  . ondansetron (ZOFRAN ODT) 4 MG disintegrating tablet Take 1 tablet (4 mg total) by mouth every 8 (eight) hours as needed for nausea or vomiting. (Patient not taking: Reported on 09/13/2015) 20 tablet 0 Not Taking    ROS Physical Exam   Blood pressure 119/72, pulse 97, temperature 97.6 F (36.4 C), temperature source Oral, resp. rate 20, height  (1.702 m), weight 131.543 kg (290 lb), last menstrual period 01/04/2015, SpO2 98 %.  Physical Exam  Constitutional: She is oriented to person, place, and time. She appears well-developed.  HENT:  Head: Normocephalic.  Neck: Normal range of motion.  Cardiovascular: Normal rate.   Respiratory: Effort normal.  GI:  EFM 130s, +accels, no decels Irreg ctx  Genitourinary:  Visually nl Cx by RN: 3/thick/ballot (unchanged)  Musculoskeletal:  Moves slow but independently  Neurological:  She is alert and oriented to person, place, and time.  Skin: Skin is warm and dry.  Psychiatric: She has a normal mood and affect. Her behavior is normal. Thought content normal.    MAU Course  Procedures  MDM NST read  Rev'd w/ pt limited options to tx as I am not sure of the cause; offered Percocet & Ambien for rest- pt agreeable  Assessment and Plan  IUP@37 .3wks Vaginal pain  D/C home Rx Ambien #20 for sleep when vaginal pain occurs at night  Jacynda Brunke CNM 09/23/2015, 4:54 AM

## 2015-09-24 LAB — CULTURE, BETA STREP (GROUP B ONLY): Strep Gp B Culture: NEGATIVE

## 2015-09-27 ENCOUNTER — Encounter: Payer: Self-pay | Admitting: Women's Health

## 2015-09-27 ENCOUNTER — Ambulatory Visit (INDEPENDENT_AMBULATORY_CARE_PROVIDER_SITE_OTHER): Payer: Medicaid Other | Admitting: Women's Health

## 2015-09-27 VITALS — BP 112/70 | HR 68 | Wt 293.0 lb

## 2015-09-27 DIAGNOSIS — Z3483 Encounter for supervision of other normal pregnancy, third trimester: Secondary | ICD-10-CM | POA: Diagnosis not present

## 2015-09-27 DIAGNOSIS — Z331 Pregnant state, incidental: Secondary | ICD-10-CM

## 2015-09-27 DIAGNOSIS — Z3493 Encounter for supervision of normal pregnancy, unspecified, third trimester: Secondary | ICD-10-CM

## 2015-09-27 DIAGNOSIS — Z3A38 38 weeks gestation of pregnancy: Secondary | ICD-10-CM | POA: Diagnosis not present

## 2015-09-27 DIAGNOSIS — Z1389 Encounter for screening for other disorder: Secondary | ICD-10-CM

## 2015-09-27 DIAGNOSIS — O09213 Supervision of pregnancy with history of pre-term labor, third trimester: Secondary | ICD-10-CM | POA: Diagnosis not present

## 2015-09-27 NOTE — Patient Instructions (Signed)
Call the office ([phone]) or go to [facility] if:  You begin to have strong, frequent contractions  Your water breaks.  Sometimes it is a big gush of fluid, sometimes it is just a trickle that keeps getting your panties wet or running down your legs  You have vaginal bleeding.  It is normal to have a small amount of spotting if your cervix was checked.   You don't feel your baby moving like normal.  If you don't, get you something to eat and drink and lay down and focus on feeling your baby move.  You should feel at least 10 movements in 2 hours.  If you don't, you should call the office or go to [facility].    Third Trimester of Pregnancy The third trimester is from week 29 through week 42, months 7 through 9. The third trimester is a time when the fetus is growing rapidly. At the end of the ninth month, the fetus is about 20 inches in length and weighs 6-10 pounds.  BODY CHANGES Your body goes through many changes during pregnancy. The changes vary from woman to woman.   Your weight will continue to increase. You can expect to gain 25-35 pounds (11-16 kg) by the end of the pregnancy.  You may begin to get stretch marks on your hips, abdomen, and breasts.  You may urinate more often because the fetus is moving lower into your pelvis and pressing on your bladder.  You may develop or continue to have heartburn as a result of your pregnancy.  You may develop constipation because certain hormones are causing the muscles that push waste through your intestines to slow down.  You may develop hemorrhoids or swollen, bulging veins (varicose veins).  You may have pelvic pain because of the weight gain and pregnancy hormones relaxing your joints between the bones in your pelvis. Backaches may result from overexertion of the muscles supporting your posture.  You may have changes in your hair. These can include thickening of your hair, rapid growth, and changes in texture. Some women  also have hair loss during or after pregnancy, or hair that feels dry or thin. Your hair will most likely return to normal after your baby is born.  Your breasts will continue to grow and be tender. A yellow discharge may leak from your breasts called colostrum.  Your belly button may stick out.  You may feel short of breath because of your expanding uterus.  You may notice the fetus "dropping," or moving lower in your abdomen.  You may have a bloody mucus discharge. This usually occurs a few days to a week before labor begins.  Your cervix becomes thin and soft (effaced) near your due date. WHAT TO EXPECT AT YOUR PRENATAL EXAMS  You will have prenatal exams every 2 weeks until week 36. Then, you will have weekly prenatal exams. During a routine prenatal visit:  You will be weighed to make sure you and the fetus are growing normally.  Your blood pressure is taken.  Your abdomen will be measured to track your baby's growth.  The fetal heartbeat will be listened to.  Any test results from the previous visit will be discussed.  You may have a cervical check near your due date to see if you have effaced. At around 36 weeks, your caregiver will check your cervix. At the same time, your caregiver will also perform a test on the secretions of the vaginal tissue. This test is to   determine if a type of bacteria, Group B streptococcus, is present. Your caregiver will explain this further. Your caregiver may ask you:  What your birth plan is.  How you are feeling.  If you are feeling the baby move.  If you have had any abnormal symptoms, such as leaking fluid, bleeding, severe headaches, or abdominal cramping.  If you are using any tobacco products, including cigarettes, chewing tobacco, and electronic cigarettes.  If you have any questions. Other tests or screenings that may be performed during your third trimester include:  Blood tests that check for low iron levels  (anemia).  Fetal testing to check the health, activity level, and growth of the fetus. Testing is done if you have certain medical conditions or if there are problems during the pregnancy.  HIV (human immunodeficiency virus) testing. If you are at high risk, you may be screened for HIV during your third trimester of pregnancy. FALSE LABOR You may feel small, irregular contractions that eventually go away. These are called Braxton Hicks contractions, or false labor. Contractions may last for hours, days, or even weeks before true labor sets in. If contractions come at regular intervals, intensify, or become painful, it is best to be seen by your caregiver.  SIGNS OF LABOR   Menstrual-like cramps.  Contractions that are 5 minutes apart or less.  Contractions that start on the top of the uterus and spread down to the lower abdomen and back.  A sense of increased pelvic pressure or back pain.  A watery or bloody mucus discharge that comes from the vagina. If you have any of these signs before the 37th week of pregnancy, call your caregiver right away. You need to go to the hospital to get checked immediately. HOME CARE INSTRUCTIONS   Avoid all smoking, herbs, alcohol, and unprescribed drugs. These chemicals affect the formation and growth of the baby.  Do not use any tobacco products, including cigarettes, chewing tobacco, and electronic cigarettes. If you need help quitting, ask your health care provider. You may receive counseling support and other resources to help you quit.  Follow your caregiver's instructions regarding medicine use. There are medicines that are either safe or unsafe to take during pregnancy.  Exercise only as directed by your caregiver. Experiencing uterine cramps is a good sign to stop exercising.  Continue to eat regular, healthy meals.  Wear a good support bra for breast tenderness.  Do not use hot tubs, steam rooms, or saunas.  Wear your seat belt at all  times when driving.  Avoid raw meat, uncooked cheese, cat litter boxes, and soil used by cats. These carry germs that can cause birth defects in the baby.  Take your prenatal vitamins.  Take 1500-2000 mg of calcium daily starting at the 20th week of pregnancy until you deliver your baby.  Try taking a stool softener (if your caregiver approves) if you develop constipation. Eat more high-fiber foods, such as fresh vegetables or fruit and whole grains. Drink plenty of fluids to keep your urine clear or pale yellow.  Take warm sitz baths to soothe any pain or discomfort caused by hemorrhoids. Use hemorrhoid cream if your caregiver approves.  If you develop varicose veins, wear support hose. Elevate your feet for 15 minutes, 3-4 times a day. Limit salt in your diet.  Avoid heavy lifting, wear low heal shoes, and practice good posture.  Rest a lot with your legs elevated if you have leg cramps or low back pain.  Visit your  dentist if you have not gone during your pregnancy. Use a soft toothbrush to brush your teeth and be gentle when you floss.  A sexual relationship may be continued unless your caregiver directs you otherwise.  Do not travel far distances unless it is absolutely necessary and only with the approval of your caregiver.  Take prenatal classes to understand, practice, and ask questions about the labor and delivery.  Make a trial run to the hospital.  Pack your hospital bag.  Prepare the baby's nursery.  Continue to go to all your prenatal visits as directed by your caregiver. SEEK MEDICAL CARE IF:  You are unsure if you are in labor or if your water has broken.  You have dizziness.  You have mild pelvic cramps, pelvic pressure, or nagging pain in your abdominal area.  You have persistent nausea, vomiting, or diarrhea.  You have a bad smelling vaginal discharge.  You have pain with urination. SEEK IMMEDIATE MEDICAL CARE IF:   You have a fever.  You are  leaking fluid from your vagina.  You have spotting or bleeding from your vagina.  You have severe abdominal cramping or pain.  You have rapid weight loss or gain.  You have shortness of breath with chest pain.  You notice sudden or extreme swelling of your face, hands, ankles, feet, or legs.  You have not felt your baby move in over an hour.  You have severe headaches that do not go away with medicine.  You have vision changes.   This information is not intended to replace advice given to you by your health care provider. Make sure you discuss any questions you have with your health care provider.   Document Released: 11/07/2001 Document Revised: 12/04/2014 Document Reviewed: 01/14/2013 Elsevier Interactive Patient Education [employer]

## 2015-09-27 NOTE — Progress Notes (Signed)
Low-risk OB appointment [id number] 3818w0d Estimated Date of Delivery: 10/11/15 BP 112/70 mmHg  Pulse 68  Wt 293 lb (132.904 kg)  LMP 01/04/2015  BP, weight reviewed.  Unable to void. Refer to obstetrical flow sheet for FH & FHR.  Reports good fm.  Denies regular uc's, lof, vb, or uti s/s. Miserable, wants to have a baby. Discussed stripping membranes @ 39wks- wants to do this SVE per request: 4/50/ballotable, vtx Reviewed labor s/s, fkc. Plan:  Continue routine obstetrical care  F/U in 1wk for OB appointment

## 2015-10-04 ENCOUNTER — Inpatient Hospital Stay (HOSPITAL_COMMUNITY)
Admission: AD | Admit: 2015-10-04 | Discharge: 2015-10-06 | DRG: 774 | Disposition: A | Discharge status: Home / Self Care | Payer: Medicaid Other | Source: Ambulatory Visit | Attending: Family Medicine | Admitting: Family Medicine

## 2015-10-04 ENCOUNTER — Encounter: Payer: Self-pay | Admitting: Women's Health

## 2015-10-04 ENCOUNTER — Encounter (HOSPITAL_COMMUNITY): Payer: Self-pay | Admitting: *Deleted

## 2015-10-04 ENCOUNTER — Ambulatory Visit (INDEPENDENT_AMBULATORY_CARE_PROVIDER_SITE_OTHER): Payer: Medicaid Other | Admitting: Women's Health

## 2015-10-04 VITALS — BP 112/70 | HR 76 | Wt 295.0 lb

## 2015-10-04 DIAGNOSIS — N760 Acute vaginitis: Secondary | ICD-10-CM | POA: Diagnosis not present

## 2015-10-04 DIAGNOSIS — Z3A39 39 weeks gestation of pregnancy: Secondary | ICD-10-CM

## 2015-10-04 DIAGNOSIS — Z833 Family history of diabetes mellitus: Secondary | ICD-10-CM | POA: Diagnosis not present

## 2015-10-04 DIAGNOSIS — Z331 Pregnant state, incidental: Secondary | ICD-10-CM | POA: Diagnosis not present

## 2015-10-04 DIAGNOSIS — Z1389 Encounter for screening for other disorder: Secondary | ICD-10-CM

## 2015-10-04 DIAGNOSIS — Z8249 Family history of ischemic heart disease and other diseases of the circulatory system: Secondary | ICD-10-CM | POA: Diagnosis not present

## 2015-10-04 DIAGNOSIS — A499 Bacterial infection, unspecified: Secondary | ICD-10-CM

## 2015-10-04 DIAGNOSIS — N390 Urinary tract infection, site not specified: Secondary | ICD-10-CM | POA: Diagnosis present

## 2015-10-04 DIAGNOSIS — O09213 Supervision of pregnancy with history of pre-term labor, third trimester: Secondary | ICD-10-CM

## 2015-10-04 DIAGNOSIS — Z3493 Encounter for supervision of normal pregnancy, unspecified, third trimester: Secondary | ICD-10-CM

## 2015-10-04 DIAGNOSIS — B9689 Other specified bacterial agents as the cause of diseases classified elsewhere: Secondary | ICD-10-CM

## 2015-10-04 DIAGNOSIS — O26893 Other specified pregnancy related conditions, third trimester: Secondary | ICD-10-CM | POA: Diagnosis present

## 2015-10-04 DIAGNOSIS — N898 Other specified noninflammatory disorders of vagina: Secondary | ICD-10-CM

## 2015-10-04 DIAGNOSIS — Z3483 Encounter for supervision of other normal pregnancy, third trimester: Secondary | ICD-10-CM

## 2015-10-04 DIAGNOSIS — Z6791 Unspecified blood type, Rh negative: Secondary | ICD-10-CM

## 2015-10-04 DIAGNOSIS — IMO0001 Reserved for inherently not codable concepts without codable children: Secondary | ICD-10-CM

## 2015-10-04 DIAGNOSIS — R102 Pelvic and perineal pain: Secondary | ICD-10-CM

## 2015-10-04 LAB — POCT WET PREP (WET MOUNT): CLUE CELLS WET PREP WHIFF POC: POSITIVE

## 2015-10-04 MED ORDER — LIDOCAINE HCL (PF) 1 % IJ SOLN
INTRAMUSCULAR | Status: AC
  Filled 2015-10-04: qty 30

## 2015-10-04 MED ORDER — METRONIDAZOLE 500 MG PO TABS: 500.0000 mg | ORAL_TABLET | Freq: Two times a day (BID) | ORAL | Status: DC

## 2015-10-04 MED ORDER — OXYTOCIN 40 UNITS IN LACTATED RINGERS INFUSION - SIMPLE MED
INTRAVENOUS | Status: AC
  Administered 2015-10-04: 999 mL/h
  Filled 2015-10-04: qty 1000

## 2015-10-04 NOTE — Patient Instructions (Signed)
Call the office ([phone]) or go to [facility] if:  You begin to have strong, frequent contractions  Your water breaks.  Sometimes it is a big gush of fluid, sometimes it is just a trickle that keeps getting your panties wet or running down your legs  You have vaginal bleeding.  It is normal to have a small amount of spotting if your cervix was checked.   You don't feel your baby moving like normal.  If you don't, get you something to eat and drink and lay down and focus on feeling your baby move.  You should feel at least 10 movements in 2 hours.  If you don't, you should call the office or go to [facility].    Braxton Hicks Contractions Contractions of the uterus can occur throughout pregnancy. Contractions are not always a sign that you are in labor.  WHAT ARE BRAXTON HICKS CONTRACTIONS?  Contractions that occur before labor are called Braxton Hicks contractions, or false labor. Toward the end of pregnancy (32-34 weeks), these contractions can develop more often and may become more forceful. This is not true labor because these contractions do not result in opening (dilatation) and thinning of the cervix. They are sometimes difficult to tell apart from true labor because these contractions can be forceful and people have different pain tolerances. You should not feel embarrassed if you go to the hospital with false labor. Sometimes, the only way to tell if you are in true labor is for your health care provider to look for changes in the cervix. If there are no prenatal problems or other health problems associated with the pregnancy, it is completely safe to be sent home with false labor and await the onset of true labor. HOW CAN YOU TELL THE DIFFERENCE BETWEEN TRUE AND FALSE LABOR? False Labor  The contractions of false labor are usually shorter and not as hard as those of true labor.   The contractions are usually irregular.   The contractions are often felt in the front of  the lower abdomen and in the groin.   The contractions may go away when you walk around or change positions while lying down.   The contractions get weaker and are shorter lasting as time goes on.   The contractions do not usually become progressively stronger, regular, and closer together as with true labor.  True Labor  Contractions in true labor last 30-70 seconds, become very regular, usually become more intense, and increase in frequency.   The contractions do not go away with walking.   The discomfort is usually felt in the top of the uterus and spreads to the lower abdomen and low back.   True labor can be determined by your health care provider with an exam. This will show that the cervix is dilating and getting thinner.  WHAT TO REMEMBER  Keep up with your usual exercises and follow other instructions given by your health care provider.   Take medicines as directed by your health care provider.   Keep your regular prenatal appointments.   Eat and drink lightly if you think you are going into labor.   If Braxton Hicks contractions are making you uncomfortable:   Change your position from lying down or resting to walking, or from walking to resting.   Sit and rest in a tub of warm water.   Drink 2-3 glasses of water. Dehydration may cause these contractions.   Do slow and deep breathing several times an hour.    WHEN SHOULD I SEEK IMMEDIATE MEDICAL CARE? Seek immediate medical care if:  Your contractions become stronger, more regular, and closer together.   You have fluid leaking or gushing from your vagina.   You have a fever.   You pass blood-tinged mucus.   You have vaginal bleeding.   You have continuous abdominal pain.   You have low back pain that you never had before.   You feel your baby's head pushing down and causing pelvic pressure.   Your baby is not moving as much as it used to.    This information is not intended to  replace advice given to you by your health care provider. Make sure you discuss any questions you have with your health care provider.   Document Released: 11/13/2005 Document Revised: 11/18/2013 Document Reviewed: 08/25/2013 Elsevier Interactive Patient Education 2016 Elsevier Inc.  

## 2015-10-04 NOTE — Progress Notes (Signed)
Low-risk OB appointment [id number] 11065w0d Estimated Date of Delivery: 10/11/15 BP 112/70 mmHg  Pulse 76  Wt 295 lb (133.811 kg)  LMP 01/04/2015  BP, weight reviewed.  Unable to void. Refer to obstetrical flow sheet for FH & FHR.  Reports good fm.  Denies regular uc's, lof, vb, or uti s/s. No complaints. Wants membranes stripped, discussed risks/benefits. Has thin malodorous d/c, wet prep +clues, Rx metronidazole 500mg  BID x 7d for BV SVE: 4.5/50/-2, vtx- unable to reach adequately to strip membranes, LHE in and membranes swept per request Reviewed labor s/s, fkc. Plan:  Continue routine obstetrical care  F/U in 1wk for OB appointment

## 2015-10-04 NOTE — MAU Note (Signed)
Patient arrived to MAU via EMS for Labor Eval. Patient yelling and crying. States was 5cm in office. SVE in MAU 9.5/100 with BBOW. Resident Made Aware. Patient taken directly to room 166 for admit via stretcher accompanied by RN and CNM.

## 2015-10-05 ENCOUNTER — Encounter (HOSPITAL_COMMUNITY): Payer: Self-pay | Admitting: *Deleted

## 2015-10-05 DIAGNOSIS — N390 Urinary tract infection, site not specified: Secondary | ICD-10-CM

## 2015-10-05 DIAGNOSIS — IMO0001 Reserved for inherently not codable concepts without codable children: Secondary | ICD-10-CM

## 2015-10-05 DIAGNOSIS — O26893 Other specified pregnancy related conditions, third trimester: Secondary | ICD-10-CM

## 2015-10-05 DIAGNOSIS — Z3A39 39 weeks gestation of pregnancy: Secondary | ICD-10-CM

## 2015-10-05 DIAGNOSIS — Z6791 Unspecified blood type, Rh negative: Secondary | ICD-10-CM

## 2015-10-05 DIAGNOSIS — B9689 Other specified bacterial agents as the cause of diseases classified elsewhere: Secondary | ICD-10-CM

## 2015-10-05 LAB — KLEIHAUER-BETKE STAIN
# VIALS RHIG: 1
FETAL CELLS %: 0 %
Quantitation Fetal Hemoglobin: 0 mL

## 2015-10-05 LAB — RPR: RPR: NONREACTIVE

## 2015-10-05 MED ORDER — OXYCODONE-ACETAMINOPHEN 5-325 MG PO TABS: 2.0000 | ORAL_TABLET | ORAL | Status: DC | PRN

## 2015-10-05 MED ORDER — ONDANSETRON HCL 4 MG PO TABS: 4.0000 mg | ORAL_TABLET | ORAL | Status: DC | PRN

## 2015-10-05 MED ORDER — DIPHENHYDRAMINE HCL 25 MG PO CAPS: 25.0000 mg | ORAL_CAPSULE | Freq: Four times a day (QID) | ORAL | Status: DC | PRN

## 2015-10-05 MED ORDER — RHO D IMMUNE GLOBULIN 1500 UNIT/2ML IJ SOSY
300.0000 ug | PREFILLED_SYRINGE | Freq: Once | INTRAMUSCULAR | Status: AC
  Administered 2015-10-05: 300 ug via INTRAMUSCULAR
  Filled 2015-10-05: qty 2

## 2015-10-05 MED ORDER — LANOLIN HYDROUS EX OINT: TOPICAL_OINTMENT | CUTANEOUS | Status: DC | PRN

## 2015-10-05 MED ORDER — METRONIDAZOLE 500 MG PO TABS
500.0000 mg | ORAL_TABLET | Freq: Two times a day (BID) | ORAL | Status: DC
  Administered 2015-10-05: 500 mg via ORAL
  Filled 2015-10-05 (×2): qty 1

## 2015-10-05 MED ORDER — WITCH HAZEL-GLYCERIN EX PADS: 1.0000 "application " | MEDICATED_PAD | CUTANEOUS | Status: DC | PRN

## 2015-10-05 MED ORDER — ONDANSETRON HCL 4 MG/2ML IJ SOLN: 4.0000 mg | INTRAMUSCULAR | Status: DC | PRN

## 2015-10-05 MED ORDER — BENZOCAINE-MENTHOL 20-0.5 % EX AERO
1.0000 "application " | INHALATION_SPRAY | CUTANEOUS | Status: DC | PRN
  Filled 2015-10-05: qty 56

## 2015-10-05 MED ORDER — OXYCODONE-ACETAMINOPHEN 5-325 MG PO TABS
1.0000 | ORAL_TABLET | ORAL | Status: DC | PRN
  Administered 2015-10-05 – 2015-10-06 (×2): 1 via ORAL
  Filled 2015-10-05 (×2): qty 1

## 2015-10-05 MED ORDER — ZOLPIDEM TARTRATE 5 MG PO TABS: 5.0000 mg | ORAL_TABLET | Freq: Every evening | ORAL | Status: DC | PRN

## 2015-10-05 MED ORDER — ACETAMINOPHEN 325 MG PO TABS: 650.0000 mg | ORAL_TABLET | ORAL | Status: DC | PRN

## 2015-10-05 MED ORDER — PRENATAL MULTIVITAMIN CH
1.0000 | ORAL_TABLET | Freq: Every day | ORAL | Status: DC
  Administered 2015-10-05 – 2015-10-06 (×2): 1 via ORAL
  Filled 2015-10-05 (×2): qty 1

## 2015-10-05 MED ORDER — IBUPROFEN 600 MG PO TABS
600.0000 mg | ORAL_TABLET | Freq: Four times a day (QID) | ORAL | Status: DC
  Administered 2015-10-05 – 2015-10-06 (×6): 600 mg via ORAL
  Filled 2015-10-05 (×6): qty 1

## 2015-10-05 MED ORDER — SIMETHICONE 80 MG PO CHEW: 80.0000 mg | CHEWABLE_TABLET | ORAL | Status: DC | PRN

## 2015-10-05 MED ORDER — SENNOSIDES-DOCUSATE SODIUM 8.6-50 MG PO TABS
2.0000 | ORAL_TABLET | ORAL | Status: DC
  Administered 2015-10-05 (×2): 2 via ORAL
  Filled 2015-10-05 (×2): qty 2

## 2015-10-05 MED ORDER — DIBUCAINE 1 % RE OINT
1.0000 "application " | TOPICAL_OINTMENT | RECTAL | Status: DC | PRN
  Filled 2015-10-05: qty 28

## 2015-10-05 NOTE — Progress Notes (Signed)
MEDICAL STUDENT PROGRESS NOTE  Post Partum Day 1  Subjective:  Patricia [name] is a 24 y.o. [phone] 7428w0d s/p SOL vaginal birth. No acute events overnight.  Pt denies problems with ambulating, voiding or po intake.  She denies nausea or vomiting.  Pain is well controlled.  She has not had flatus.  Lochia Small.  Plan for birth control is IUD.  Method of Feeding: Bottle  Objective: Blood pressure 98/48, pulse 70, temperature 98.3 F (36.8 C), temperature source Oral, resp. rate 20, height 5\' 7"  (1.702 m), weight 133.811 kg (295 lb), last menstrual period 01/04/2015, unknown if currently breastfeeding.  Physical Exam:  General: alert, cooperative and no distress Lochia: normal flow Chest: normal WOB, CTAB, no W/C/R Heart: Regular rate no m/r/g Abdomen: +BS, soft, mild TTP (appropriate) Uterine Fundus: firm DVT Evaluation: No evidence of DVT seen on physical exam. Extremities: trace edema  No results for input(s): HGB, HCT in the last 72 hours.  Assessment/Plan:  ASSESSMENT: Patricia [name] is a 24 y.o. [phone] 6928w0d s/p SOL vaginal delivery. Well appearing. No concerns for decompensation or infection.   Plan for discharge tomorrow Continue routine PP care Breastfeeding support PRN   LOS: 1 day   JohannesL [place] Medial Student MS3 10/05/2015, 10:14 AM

## 2015-10-05 NOTE — H&P (Signed)
LABOR ADMISSION HISTORY AND PHYSICAL  Patricia [name] is a 24 y.o. female [phone] with IUP at [redacted]w[redacted]d by LMP presenting for spontaneous onset of labor.   She first started feeling labor contractions today at 7:30pm that were 15-20 minutes apart. They continued to progress until eventually around 10pm the contractions were every . After taking a shower to help with the pain of the contractions, she decided to call EMS. When EMS arrived on the scene they timed her contractions at every . She denied loss of fluid or vaginal bleeding. Upon arrival at the MAU, she endorsed fetal movement and strong contractions. She felt baby's head in her pelvis and the urge to push. She was evaluated to be 9 cm with a bulging sac and was promptly transferred to labor & delivery. Upon arrival there she was found on SVE to be complete and a precipitous delivery ensued. Please see delivery note for details.   Dating: By LMP --->  Estimated Date of Delivery: 10/11/15  Sono:   , CWD , CWD , CWD , CWD, NT normal , CWD, normal anatomy, cephalic presentation, longitudinal lie, 477g , CWD, normal anatomy, cephalic presentation, longitudinal lie, 982g, 53.5%EFW  Prenatal History/Complications: History of gestational diabetes and preterm delivery ([redacted]w[redacted]d). History of HSV. Denies lesions today at time of delivery. Taking acyclovir for suppressive therapy. Diagnosed with bacterial vaginosis today. Currently taking metronidazole. Diagnosed with UTI during pregnancy. Culture showed persistent E. Coli. Currently taking Macrobid for resistant infection and would like to continue this after pregnancy.  Past Medical History: Past Medical History  Diagnosis Date  . Preterm labor without delivery in second trimester June 2012    Positive FFN in June  . UTI (lower urinary tract infection)   . Bacterial vaginosis   . Chlamydia   . Migraine   . Preterm labor   . Anemia    Past Surgical  History: Past Surgical History  Procedure Laterality Date  . No past surgeries     Obstetrical History: OB History    Gravida Para Term Preterm AB TAB SAB Ectopic Multiple Living   0 3     Social History: Social History   Social History  . Marital Status: Single    Spouse Name: N/A  . Number of Children: N/A  . Years of Education: N/A   Social History Main Topics  . Smoking status: Never Smoker   . Smokeless tobacco: Never Used  . Alcohol Use: No  . Drug Use: No  . Sexual Activity: Not Currently    Birth Control/ Protection: None   Other Topics Concern  . None   Social History Narrative   Ms. Nilson lives in [place] with her two sons (4 and 2yo). Her grandmother helps take care of her children when she is at work. She was working at [employer] part-time during her pregnancy and plans to return to work 2 weeks post-partum. FOB lives close by and plans to be involved in [name]'s life.    Family History: Family History  Problem Relation Age of Onset  . Diabetes Other     great granmother  . Diabetes Paternal Grandfather   . Hypertension Paternal Grandfather   . Cancer Paternal Grandfather     prostate  . Miscarriages / Stillbirths Maternal Grandmother     Allergies: No Known Allergies  Prescriptions prior to admission  Medication Sig Dispense Refill Last Dose  . acyclovir (ZOVIRAX) 400 MG tablet Take 1 tablet (  400 mg total) by mouth 3 (three) times daily. 90 tablet 3 Taking  . ferrous sulfate 325 (65 FE) MG tablet Take 1 tablet (325 mg total) by mouth 2 (two) times daily with a meal. 60 tablet 3 Taking  . metroNIDAZOLE (FLAGYL) 500 MG tablet Take 1 tablet (500 mg total) by mouth 2 (two) times daily. X 7 days. No sex or alcohol while taking 14 tablet 0   . nitrofurantoin, macrocrystal-monohydrate, (MACROBID) 100 MG capsule Take 100 mg by mouth 2 (two) times daily.   Taking  . Prenat-FeFum-FePo-FA-Omega 3 (CONCEPT DHA) 53.5-38-1 MG CAPS TK 1 C PO D  11  Taking    [name]  All systems reviewed and negative except as stated in HPI  Physical Exam: BP 127/68 mmHg  Pulse 75  Temp(Src) 98.3 F (36.8 C) (Oral)  Resp 20  Ht  (1.702 m)  Wt 133.811 kg (295 lb)  BMI 46.19 kg/m2  LMP 01/04/2015  Breastfeeding? Unknown General appearance: alert, distracted, mildly obese female in moderate distress and visible pain Lungs: no increased WOB Abdomen: gravid, appropriately tender Pelvic: 10/100/+1 upon arrival Fetal monitoring: FHR 117 bpm, no FHT obtained d/t imminent delivery Uterine activity: strong contractions every 3-4 minutes per EMS/patient's report  Prenatal labs: ABO, Rh: --/--/A NEG (11/07 2330) Antibody: NEG (11/07 2330) Rubella: Immune (03/04/15) RPR: Non Reactive (08/12 0921)  HBsAg: Negative (04/07 0918)  HIV: Non Reactive (08/12 0921)  GBS: Negative (10/24 0000)  1 hr Glucola: Normal, 81/115/85 Genetic screening: Negative Anatomy US: Normal female  Prenatal Transfer Tool  Maternal Diabetes: No Genetic Screening: Normal Maternal Ultrasounds/Referrals: Normal Fetal Ultrasounds or other Referrals:  None Maternal Substance Abuse:  No Significant Maternal Medications:  None Significant Maternal Lab Results: Lab values include: Group B Strep negative  Results for orders placed or performed during the hospital encounter of 10/04/15 (from the past 24 hour(s))  Type and screen [facility] [place]   Collection Time: 10/04/15 11:30 PM  Result Value Ref Range   ABO/RH(D) A NEG    Antibody Screen NEG    Sample Expiration 10/07/2015    Unit Number [id number]    Blood Component Type RBC LR PHER2    Unit division 00    Status of Unit ALLOCATED    Transfusion Status OK TO TRANSFUSE    Crossmatch Result COMPATIBLE    Unit Number [id number]    Blood Component Type RBC LR PHER1    Unit division 00    Status of Unit ALLOCATED    Transfusion Status OK TO TRANSFUSE    Crossmatch Result COMPATIBLE    Results for orders placed or performed in visit on 10/04/15 (from the past 24 hour(s))  POCT Wet Prep [place])   Collection Time: 10/04/15  4:25 PM  Result Value Ref Range   Source Wet Prep POC vaginal    WBC, Wet Prep HPF POC mod    Bacteria Wet Prep HPF POC None None, Few, Too numerous to count   BACTERIA WET PREP MORPHOLOGY POC     Clue Cells Wet Prep HPF POC Many (A) None, Too numerous to count   Clue Cells Wet Prep Whiff POC Positive Whiff    Yeast Wet Prep HPF POC None    KOH Wet Prep POC     Trichomonas Wet Prep HPF POC none     Patient Active Problem List   Diagnosis Date Noted  . Active labor at term 10/05/2015  . Chlamydia infection affecting pregnancy  in third trimester, antepartum 08/20/2015  . Vaginal pain 08/08/2015  . 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 UTI (urinary tract infection) in pregnancy in second trimester 05/11/2015  . Acute URI 03/16/2015  . Supervision of normal pregnancy 02/23/2015  . H/O preterm delivery, currently pregnant 02/23/2015  . History of gestational diabetes in prior pregnancy, currently pregnant 02/23/2015  . Rh negative state in antepartum period 02/23/2015  . Contraceptive management 05/29/2013  . Obesity 04/01/2013    Assessment: Patricia [name] is a 24 y.o. [phone] at 7337w1d here for spontaneous onset of labor at term and subsequent precipitous delivery soon after arrival. Please see delivery note for details. Patient currently undergoing treatment for BV and chronic UTI but otherwise without significant medical history. #Labor: spontaneous, imminent labor #Pain Control:  none #FWB:  FHR 117bpm, unable to obtain tracing due to precipitous delivery #ID:   GBS negative #MOF:  Bottle #MOC: IUD, 8wks post-partum; interested in Depo or other MOC during the interim period  Gabriel RungKristen Westfall, [facility] MS3

## 2015-10-05 NOTE — Progress Notes (Signed)
UR chart review completed.  

## 2015-10-05 NOTE — Procedures (Signed)
Delivery Note Patricia [name] is a 24 y.o. [phone] at 2282w0d who presented to the MAU in spontaneous active labor. She was evaluated to be 9 cm and with bulging sac on arrival and was promptly admitted to Labor & Delivery. The patient was transferred to the floor and found to be complete on SVE on arrival. Her membranes were mechanically ruptured and she subsequently delivered precipitously.  At 11:40 PM she delivered a healthy baby girl was delivered via Spontaneous  Vaginal Delivery (LOA).    Placenta status: Intact, Spontaneous.  Cord: 3 vessels  Complications. none APGAR (1 MIN): 8   APGAR (5 MINS): 9   Anesthesia: None  Episiotomy: None Lacerations: None Suture Repair: N/A Est. Blood Loss (mL): 200  Mom to postpartum.  Baby to Couplet care / Skin to Skin.  Gabriel RungKristen Westfall, [facility] MS3 10/05/2015, 1:17 AM

## 2015-10-06 NOTE — Discharge Instructions (Signed)

## 2015-10-06 NOTE — Discharge Summary (Signed)
OB Discharge Summary    Patient Name: Patricia [name] DOB: 03-24-1991 MRN: [id number]  Date of admission: 10/04/2015 Delivering MD: Levie Heritage   Date of discharge: 10/06/2015  Admitting diagnosis: 39 WEEKS CTX Intrauterine pregnancy: [redacted]w[redacted]d     Secondary diagnosis: Active Problems:   Active labor at term     Discharge diagnosis: Term Pregnancy Delivered                                                                                                Post partum procedures: none  Augmentation: none  Complications: None  Hospital course:   BENA KOBEL is a 24 y.o. [phone] with no significant PMH who presented on 10/04/2015 at [redacted]w[redacted]d in active labor. Patient had an uncomplicated labor course. She progressed without issue or augmentation. At  11:40 PM on 10/04/15 she delivered a healthy baby girl via normal spontaneous vaginal delivery. Pateint had an uncomplicated postpartum course and was discharged home in stable condition on 10/06/2015  Exam at time of discharge:  Subjective: Patient reports doing well this morning. Pain is controlled on PO meds. Ambulating to bathroom without issues, reports normal voiding. Reports mild lochia with minimal bleeding, which is decreasing. Pertinent negatives on ROS include no blurry vision, no headache, no dyspnea, no chest pain, no epigastric or RUQ pain, no nausea or vomiting, no fevers, no hematuria, no dysuria. Denies flatus or BM yet. Tolerating regular diet.  Objective: Filed Vitals:   10/05/15 0900 10/05/15 1345 10/05/15 1801 10/06/15 0635  BP: 98/48 106/50 104/57 107/60  Pulse: 70 60 72 57  Temp: 98.3 F (36.8 C) 98.6 F (37 C) 98.3 F (36.8 C) 98.1 F (36.7 C)  Resp: GEN: alert, comfortable-appearing woman resting in hospital bed. PULM: CTAB on frontal field exam CV: RRR, S1 and S2 heard, no M/R/G appreciated ABD: Fundus firm below umbilicus. Abdomen appropriately TTP. No epigastric or RUQ pain. No  guarding. GU: Lochia appropriate EXTR: No LE edema or calf tenderness.   Discharge instruction: per After Visit Summary and "Baby and Me Booklet". -- Ibuprofen for pain management -- Continue prenatal vitamin -- Pelvic rest for 6 weeks -- f/u visits in 2 and 6 weeks with prenatal care provider  Medications:    Medication List    STOP taking these medications        nitrofurantoin (macrocrystal-monohydrate) 100 MG capsule  Commonly known as:  MACROBID      TAKE these medications        acyclovir 400 MG tablet  Commonly known as:  ZOVIRAX  Take 1 tablet (400 mg total) by mouth 3 (three) times daily.     CONCEPT DHA 53.5-38-1 MG Caps  TK 1 C PO D     ferrous sulfate 325 (65 FE) MG tablet  Take 1 tablet (325 mg total) by mouth 2 (two) times daily with a meal.       Diet: routine diet  Activity: Advance as tolerated. Pelvic rest for 6 weeks.   Outpatient follow up: 6 weeks  Postpartum contraception: IUD Mirena  Newborn Data: Live born female  Birth Weight: 6 lb 9.1 oz (2980 g) APGAR: 8, 9  Baby Feeding: Bottle Disposition:home with mother  Gerri SporeCaitlin Sullivan, MD 10/06/2015 7:12 AM   OB FELLOW DISCHARGE ATTESTATION  I have seen and examined this patient and agree with above documentation in the resident's note.   Silvano BilisNoah B Glenden Rossell, MD 7:30 AM

## 2015-10-07 ENCOUNTER — Telehealth: Payer: Self-pay | Admitting: Women's Health

## 2015-10-07 LAB — RH IG WORKUP (INCLUDES ABO/RH)
ABO/RH(D): A NEG
Fetal Screen: POSITIVE
GESTATIONAL AGE(WKS): 39
Unit division: 0

## 2015-10-07 MED ORDER — TRAMADOL HCL 50 MG PO TABS: 50.0000 mg | ORAL_TABLET | Freq: Four times a day (QID) | ORAL | Status: DC | PRN

## 2015-10-07 NOTE — Telephone Encounter (Signed)
Pt c/o abdominal pain, SVD 10/05/2015, states Ibuprofen not helping. Can pt get med for pain?

## 2015-10-07 NOTE — Telephone Encounter (Signed)
Tramadol 50mg  q6 hour prn #20 no RF to [facility]

## 2015-10-07 NOTE — Telephone Encounter (Signed)
Pt informed Tramadol sent to pharmacy.

## 2015-10-08 LAB — TYPE AND SCREEN
ABO/RH(D): A NEG
Antibody Screen: NEGATIVE
UNIT DIVISION: 0
UNIT DIVISION: 0

## 2015-10-11 ENCOUNTER — Encounter: Payer: Medicaid Other | Admitting: Women's Health

## 2015-10-19 ENCOUNTER — Telehealth: Payer: Self-pay | Admitting: *Deleted

## 2015-10-19 NOTE — Telephone Encounter (Signed)
[name] and [facility] that Patricia [name] is 15 days pp and her [name] score was 17 and she is also complaining of constipation.  Please advise

## 2015-10-20 NOTE — Telephone Encounter (Signed)
Pt states she is not having any thoughts of harming herself or the baby, will keep her appt for 11/08/15. Pt also informed of [name] Booker, CNM recommendation for constipation and verbalized understanding.

## 2015-10-27 DIAGNOSIS — Z029 Encounter for administrative examinations, unspecified: Secondary | ICD-10-CM

## 2015-11-08 ENCOUNTER — Encounter: Payer: Self-pay | Admitting: Women's Health

## 2015-11-08 ENCOUNTER — Ambulatory Visit: Payer: Medicaid Other | Admitting: Women's Health

## 2015-11-08 ENCOUNTER — Ambulatory Visit (INDEPENDENT_AMBULATORY_CARE_PROVIDER_SITE_OTHER): Payer: Medicaid Other | Admitting: Women's Health

## 2015-11-08 DIAGNOSIS — R768 Other specified abnormal immunological findings in serum: Secondary | ICD-10-CM

## 2015-11-08 DIAGNOSIS — Z8751 Personal history of pre-term labor: Secondary | ICD-10-CM | POA: Insufficient documentation

## 2015-11-08 DIAGNOSIS — O99345 Other mental disorders complicating the puerperium: Secondary | ICD-10-CM

## 2015-11-08 DIAGNOSIS — F53 Postpartum depression: Secondary | ICD-10-CM | POA: Insufficient documentation

## 2015-11-08 DIAGNOSIS — Z8632 Personal history of gestational diabetes: Secondary | ICD-10-CM | POA: Insufficient documentation

## 2015-11-08 NOTE — Patient Instructions (Signed)
NO SEX UNTIL AFTER YOU GET YOUR BIRTH CONTROL   Postpartum Depression and Baby Blues The postpartum period begins right after the birth of a baby. During this time, there is often a great amount of joy and excitement. It is also a time of many changes in the life of the parents. Regardless of how many times a mother gives birth, each child brings new challenges and dynamics to the family. It is not unusual to have feelings of excitement along with confusing shifts in moods, emotions, and thoughts. All mothers are at risk of developing postpartum depression or the "baby blues." These mood changes can occur right after giving birth, or they may occur many months after giving birth. The baby blues or postpartum depression can be mild or severe. Additionally, postpartum depression can go away rather quickly, or it can be a long-term condition.  CAUSES Raised hormone levels and the rapid drop in those levels are thought to be a main cause of postpartum depression and the baby blues. A number of hormones change during and after pregnancy. Estrogen and progesterone usually decrease right after the delivery of your baby. The levels of thyroid hormone and various cortisol steroids also rapidly drop. Other factors that play a role in these mood changes include major life events and genetics.  RISK FACTORS If you have any of the following risks for the baby blues or postpartum depression, know what symptoms to watch out for during the postpartum period. Risk factors that may increase the likelihood of getting the baby blues or postpartum depression include:  Having a personal or family history of depression.   Having depression while being pregnant.   Having premenstrual mood issues or mood issues related to oral contraceptives.  Having a lot of life stress.   Having marital conflict.   Lacking a social support network.   Having a baby with special needs.   Having health problems, such as diabetes.   SIGNS AND SYMPTOMS Symptoms of baby blues include:  Brief changes in mood, such as going from extreme happiness to sadness.  Decreased concentration.   Difficulty sleeping.   Crying spells, tearfulness.   Irritability.   Anxiety.  Symptoms of postpartum depression typically begin within the first month after giving birth. These symptoms include:  Difficulty sleeping or excessive sleepiness.   Marked weight loss.   Agitation.   Feelings of worthlessness.   Lack of interest in activity or food.  Postpartum psychosis is a very serious condition and can be dangerous. Fortunately, it is rare. Displaying any of the following symptoms is cause for immediate medical attention. Symptoms of postpartum psychosis include:   Hallucinations and delusions.   Bizarre or disorganized behavior.   Confusion or disorientation.  DIAGNOSIS  A diagnosis is made by an evaluation of your symptoms. There are no medical or lab tests that lead to a diagnosis, but there are various questionnaires that a health care provider may use to identify those with the baby blues, postpartum depression, or psychosis. Often, a screening tool called the [place] Postnatal Depression Scale is used to diagnose depression in the postpartum period.  TREATMENT The baby blues usually goes away on its own in 1-2 weeks. Social support is often all that is needed. You will be encouraged to get adequate sleep and rest. Occasionally, you may be given medicines to help you sleep.  Postpartum depression requires treatment because it can last several months or longer if it is not treated. Treatment may include individual or group  therapy, medicine, or both to address any social, physiological, and psychological factors that may play a role in the depression. Regular exercise, a healthy diet, rest, and social support may also be strongly recommended.  Postpartum psychosis is more serious and needs treatment right away.  Hospitalization is often needed. HOME CARE INSTRUCTIONS  Get as much rest as you can. Nap when the baby sleeps.   Exercise regularly. Some women find yoga and walking to be beneficial.   Eat a balanced and nourishing diet.   Do little things that you enjoy. Have a cup of tea, take a bubble bath, read your favorite magazine, or listen to your favorite music.  Avoid alcohol.   Ask for help with household chores, cooking, grocery shopping, or running errands as needed. Do not try to do everything.   Talk to people close to you about how you are feeling. Get support from your partner, family members, friends, or other new moms.  Try to stay positive in how you think. Think about the things you are grateful for.   Do not spend a lot of time alone.   Only take over-the-counter or prescription medicine as directed by your health care provider.  Keep all your postpartum appointments.   Let your health care provider know if you have any concerns.  SEEK MEDICAL CARE IF: You are having a reaction to or problems with your medicine. SEEK IMMEDIATE MEDICAL CARE IF:  You have suicidal feelings.   You think you may harm the baby or someone else. MAKE SURE YOU:  Understand these instructions.  Will watch your condition.  Will get help right away if you are not doing well or get worse.   This information is not intended to replace advice given to you by your health care provider. Make sure you discuss any questions you have with your health care provider.   Document Released: 08/17/2004 Document Revised: 11/18/2013 Document Reviewed: 08/25/2013 Elsevier Interactive Patient Education [employer]2016 Elsevier Inc.

## 2015-11-08 NOTE — Progress Notes (Signed)
Patient ID: Ricky Alaymesia D Magloire, female   DOB: 03-13-1991, 24 y.o.   MRN: [id number] Subjective:    Ricky Alaymesia D Mcdonell is a 24 y.o. [phone] African American female who presents for a postpartum visit. She is 4 weeks postpartum following a spontaneous vaginal delivery at 39.1 gestational weeks w/ precipitous delivery in MAU. Anesthesia: none. I have fully reviewed the prenatal and intrapartum course. Postpartum course has been uncomplicated. Baby's course has been uncomplicated. Baby is feeding by breast/bottle. Bleeding no bleeding. Bowel function is normal. Bladder function is normal. Patient is not sexually active. Last sexual activity: prior to birth of baby. Contraception method is none and wants IUD- states last IUD partially expelled- notified this can potentially happen again- wants to try anyway. Postpartum depression screening: positive. Score 15.  Not eating/sleeping well, doesn't find joy in things she used to. Denies SI/HI/II. No h/o PPD, feels it is d/t problems w/ fob during pregnancy/being unfaithful. Offered meds/counseling, only wants counseling at this time. Last pap 03/2014 and was neg. Had +HSV2 during pregnancy- she wants it tested again.   The following portions of the patient's history were reviewed and updated as appropriate: allergies, current medications, past medical history, past surgical history and problem list.  [name] Pertinent items are noted in HPI.   Filed Vitals:   11/08/15 1556  BP: 122/68  Pulse: 68  Weight: 276 lb (125.193 kg)   No LMP recorded.  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

## 2015-11-09 LAB — HSV 2 ANTIBODY, IGG: HSV 2 Glycoprotein G Ab, IgG: 21.3 index — ABNORMAL HIGH (ref 0.00–0.90)

## 2015-11-16 ENCOUNTER — Ambulatory Visit: Payer: Medicaid Other | Admitting: Women's Health

## 2015-11-23 ENCOUNTER — Ambulatory Visit: Payer: Medicaid Other | Admitting: Advanced Practice Midwife

## 2015-11-25 ENCOUNTER — Emergency Department (HOSPITAL_COMMUNITY)
Admission: EM | Admit: 2015-11-25 | Discharge: 2015-11-25 | Disposition: A | Discharge status: Home / Self Care | Payer: Medicaid Other | Attending: Emergency Medicine | Admitting: Emergency Medicine

## 2015-11-25 ENCOUNTER — Encounter (HOSPITAL_COMMUNITY): Payer: Self-pay | Admitting: Emergency Medicine

## 2015-11-25 DIAGNOSIS — Z8619 Personal history of other infectious and parasitic diseases: Secondary | ICD-10-CM | POA: Insufficient documentation

## 2015-11-25 DIAGNOSIS — J329 Chronic sinusitis, unspecified: Secondary | ICD-10-CM

## 2015-11-25 DIAGNOSIS — J018 Other acute sinusitis: Secondary | ICD-10-CM | POA: Insufficient documentation

## 2015-11-25 DIAGNOSIS — M791 Myalgia: Secondary | ICD-10-CM | POA: Insufficient documentation

## 2015-11-25 DIAGNOSIS — Z8679 Personal history of other diseases of the circulatory system: Secondary | ICD-10-CM | POA: Diagnosis not present

## 2015-11-25 DIAGNOSIS — Z862 Personal history of diseases of the blood and blood-forming organs and certain disorders involving the immune mechanism: Secondary | ICD-10-CM | POA: Insufficient documentation

## 2015-11-25 DIAGNOSIS — R0981 Nasal congestion: Secondary | ICD-10-CM | POA: Diagnosis present

## 2015-11-25 DIAGNOSIS — Z8742 Personal history of other diseases of the female genital tract: Secondary | ICD-10-CM | POA: Insufficient documentation

## 2015-11-25 DIAGNOSIS — J069 Acute upper respiratory infection, unspecified: Secondary | ICD-10-CM

## 2015-11-25 DIAGNOSIS — Z87442 Personal history of urinary calculi: Secondary | ICD-10-CM | POA: Diagnosis not present

## 2015-11-25 MED ORDER — LORATADINE-PSEUDOEPHEDRINE ER 5-120 MG PO TB12: 1.0000 | ORAL_TABLET | Freq: Two times a day (BID) | ORAL | Status: DC

## 2015-11-25 MED ORDER — DEXAMETHASONE 4 MG PO TABS: 4.0000 mg | ORAL_TABLET | Freq: Two times a day (BID) | ORAL | Status: DC

## 2015-11-25 NOTE — ED Provider Notes (Signed)
CSN: [id number]     Arrival date & time 11/25/15  1826 History   First MD Initiated Contact with Patient 11/25/15 2036     Chief Complaint  Patient presents with  . Nasal Congestion     (Consider location/radiation/quality/duration/timing/severity/associated sxs/prior Treatment) Patient is a 24 y.o. female presenting with URI. The history is provided by the patient.  URI Presenting symptoms: congestion and cough   Presenting symptoms: no fever   Congestion:    Location:  Nasal   Interferes with sleep: yes   Severity:  Mild Onset quality:  Gradual Duration:  3 days Timing:  Intermittent Progression:  Worsening Chronicity:  New Relieved by:  Nothing Worsened by:  Certain positions Associated symptoms: myalgias and sinus pain   Associated symptoms: no wheezing   Risk factors: sick contacts   Risk factors: no diabetes mellitus and no immunosuppression     Past Medical History  Diagnosis Date  . Preterm labor without delivery in second trimester June 2012    Positive FFN in June  . UTI (lower urinary tract infection)   . Bacterial vaginosis   . Chlamydia   . Migraine   . Preterm labor   . Anemia    Past Surgical History  Procedure Laterality Date  . No past surgeries     Family History  Problem Relation Age of Onset  . Diabetes Other     great granmother  . Diabetes Paternal Grandfather   . Hypertension Paternal Grandfather   . Cancer Paternal Grandfather     prostate  . Miscarriages / Stillbirths Maternal Grandmother    Social History  Substance Use Topics  . Smoking status: Never Smoker   . Smokeless tobacco: Never Used  . Alcohol Use: No   OB History    Gravida Para Term Preterm AB TAB SAB Ectopic Multiple Living   3 3 2 1      0 3     [name]  Constitutional: Negative for fever.  HENT: Positive for congestion.   Respiratory: Positive for cough. Negative for wheezing.   Musculoskeletal: Positive for myalgias.  All other systems reviewed  and are negative.     Allergies  Review of patient's allergies indicates no known allergies.  Home Medications   Prior to Admission medications   Medication Sig Start Date End Date Taking? Authorizing Provider  acyclovir (ZOVIRAX) 400 MG tablet Take 1 tablet (400 mg total) by mouth 3 (three) times daily. Patient not taking: Reported on 11/08/2015 08/20/15   Cheral MarkerKimberly R Booker, CNM  ferrous sulfate 325 (65 FE) MG tablet Take 1 tablet (325 mg total) by mouth 2 (two) times daily with a meal. Patient not taking: Reported on 11/08/2015 07/12/15   Cheral MarkerKimberly R Booker, CNM  traMADol (ULTRAM) 50 MG tablet Take 1 tablet (50 mg total) by mouth every 6 (six) hours as needed. Patient not taking: Reported on 11/08/2015 10/07/15   Jacklyn ShellFrances Cresenzo-Dishmon, CNM   BP 119/61 mmHg  Pulse 86  Temp(Src) 97.8 F (36.6 C) (Oral)  Resp 18  Ht 5\' 7"  (1.702 m)  Wt 121.11 kg  BMI 41.81 kg/m2  SpO2 100%  LMP 11/16/2015 Physical Exam  Constitutional: She is oriented to person, place, and time. She appears well-developed and well-nourished.  Non-toxic appearance.  HENT:  Head: Normocephalic.  Right Ear: Tympanic membrane and external ear normal.  Left Ear: Tympanic membrane and external ear normal.  Nasal congestion  Eyes: EOM and lids are normal. Pupils are equal, round, and reactive to  light.  Neck: Normal range of motion. Neck supple. Carotid bruit is not present.  Cardiovascular: Normal rate, regular rhythm, normal heart sounds, intact distal pulses and normal pulses.   Pulmonary/Chest: Breath sounds normal. No respiratory distress.  Abdominal: Soft. Bowel sounds are normal. There is no tenderness. There is no guarding.  Musculoskeletal: Normal range of motion.  Lymphadenopathy:       Head (right side): No submandibular adenopathy present.       Head (left side): No submandibular adenopathy present.    She has no cervical adenopathy.  Neurological: She is alert and oriented to person, place, and  time. She has normal strength. No cranial nerve deficit or sensory deficit.  Skin: Skin is warm and dry.  Psychiatric: She has a normal mood and affect. Her speech is normal.  Nursing note and vitals reviewed.   ED Course  Procedures (including critical care time) Labs Review Labs Reviewed - No data to display  Imaging Review No results found. I have personally reviewed and evaluated these images and lab results as part of my medical decision-making.   EKG Interpretation None      MDM  Vital signs reviewed. Examination favors sinusitis and upper respiratory infection.  Prescription for Claritin-D and Decadron given to the patient. Discussed with the patient importance of handwashing, increasing fluids. Patient is in agreement with discharge plan.    Final diagnoses:  Other sinusitis  URI (upper respiratory infection)    *I have reviewed nursing notes, vital signs, and all appropriate lab and imaging results for this patient.[address] PA-C 11/26/15 1605  Bethann Berkshire, MD 11/29/15 [phone]

## 2015-11-25 NOTE — Discharge Instructions (Signed)
Sinusitis, Adult °Sinusitis is redness, soreness, and puffiness (inflammation) of the air pockets in the bones of your face (sinuses). The redness, soreness, and puffiness can cause air and mucus to get trapped in your sinuses. This can allow germs to grow and cause an infection.  °HOME CARE  °· Drink enough fluids to keep your pee (urine) clear or pale yellow. °· Use a humidifier in your home. °· Run a hot shower to create steam in the bathroom. Sit in the bathroom with the door closed. Breathe in the steam 3-4 times a day. °· Put a warm, moist washcloth on your face 3-4 times a day, or as told by your doctor. °· Use salt water sprays (saline sprays) to wet the thick fluid in your nose. This can help the sinuses drain. °· Only take medicine as told by your doctor. °GET HELP RIGHT AWAY IF:  °· Your pain gets worse. °· You have very bad headaches. °· You are sick to your stomach (nauseous). °· You throw up (vomit). °· You are very sleepy (drowsy) all the time. °· Your face is puffy (swollen). °· Your vision changes. °· You have a stiff neck. °· You have trouble breathing. °MAKE SURE YOU:  °· Understand these instructions. °· Will watch your condition. °· Will get help right away if you are not doing well or get worse. °  °This information is not intended to replace advice given to you by your health care provider. Make sure you discuss any questions you have with your health care provider. °  °Document Released: 05/01/2008 Document Revised: 12/04/2014 Document Reviewed: 06/18/2012 °Elsevier Interactive Patient Education ©2016 Elsevier Inc. ° °Upper Respiratory Infection, Adult °Most upper respiratory infections (URIs) are caused by a virus. A URI affects the nose, throat, and upper air passages. The most common type of URI is often called "the common cold." °HOME CARE  °· Take medicines only as told by your doctor. °· Gargle warm saltwater or take cough drops to comfort your throat as told by your doctor. °· Use a  warm mist humidifier or inhale steam from a shower to increase air moisture. This may make it easier to breathe. °· Drink enough fluid to keep your pee (urine) clear or pale yellow. °· Eat soups and other clear broths. °· Have a healthy diet. °· Rest as needed. °· Go back to work when your fever is gone or your doctor says it is okay. °¨ You may need to stay home longer to avoid giving your URI to others. °¨ You can also wear a face mask and wash your hands often to prevent spread of the virus. °· Use your inhaler more if you have asthma. °· Do not use any tobacco products, including cigarettes, chewing tobacco, or electronic cigarettes. If you need help quitting, ask your doctor. °GET HELP IF: °· You are getting worse, not better. °· Your symptoms are not helped by medicine. °· You have chills. °· You are getting more short of breath. °· You have brown or red mucus. °· You have yellow or brown discharge from your nose. °· You have pain in your face, especially when you bend forward. °· You have a fever. °· You have puffy (swollen) neck glands. °· You have pain while swallowing. °· You have white areas in the back of your throat. °GET HELP RIGHT AWAY IF:  °· You have very bad or constant: °¨ Headache. °¨ Ear pain. °¨ Pain in your forehead, behind your eyes, and over   your cheekbones (sinus pain). °¨ Chest pain. °· You have long-lasting (chronic) lung disease and any of the following: °¨ Wheezing. °¨ Long-lasting cough. °¨ Coughing up blood. °¨ A change in your usual mucus. °· You have a stiff neck. °· You have changes in your: °¨ Vision. °¨ Hearing. °¨ Thinking. °¨ Mood. °MAKE SURE YOU:  °· Understand these instructions. °· Will watch your condition. °· Will get help right away if you are not doing well or get worse. °  °This information is not intended to replace advice given to you by your health care provider. Make sure you discuss any questions you have with your health care provider. °  °Document Released:  05/01/2008 Document Revised: 03/30/2015 Document Reviewed: 02/18/2014 °Elsevier Interactive Patient Education ©2016 Elsevier Inc. ° °

## 2015-11-25 NOTE — ED Notes (Signed)
PT c/o nasal congestion with green mucus x3 days and denies any fever.

## 2015-12-01 ENCOUNTER — Ambulatory Visit (INDEPENDENT_AMBULATORY_CARE_PROVIDER_SITE_OTHER): Payer: Medicaid Other | Admitting: Advanced Practice Midwife

## 2015-12-01 ENCOUNTER — Encounter: Payer: Self-pay | Admitting: Advanced Practice Midwife

## 2015-12-01 VITALS — BP 110/70 | HR 86 | Ht 67.0 in | Wt 281.5 lb

## 2015-12-01 DIAGNOSIS — Z30430 Encounter for insertion of intrauterine contraceptive device: Secondary | ICD-10-CM | POA: Diagnosis not present

## 2015-12-01 DIAGNOSIS — Z30014 Encounter for initial prescription of intrauterine contraceptive device: Secondary | ICD-10-CM | POA: Diagnosis not present

## 2015-12-01 DIAGNOSIS — Z3202 Encounter for pregnancy test, result negative: Secondary | ICD-10-CM

## 2015-12-01 LAB — POCT URINE PREGNANCY: Preg Test, Ur: NEGATIVE

## 2015-12-01 MED ORDER — PRENATAL 27-0.8 MG PO TABS: 1.0000 | ORAL_TABLET | Freq: Every day | ORAL | Status: DC

## 2015-12-01 NOTE — Addendum Note (Signed)
Addended by: Jacklyn ShellRESENZO-DISHMON, Henderson Frampton on: 12/01/2015 03:12 PM   Modules accepted: Orders

## 2015-12-01 NOTE — Progress Notes (Signed)
Ricky Alaymesia D Larose is a 25 y.o. year old  female   who presents for placement of a Mirena IUD. She has not had sex since delivery and her pregnancy test today is negative.    The risks and benefits of the method and placement have been thouroughly reviewed with the patient and all questions were answered.  Specifically the patient is aware of failure rate of 11/998, expulsion of the IUD and of possible perforation.  The patient is aware of irregular bleeding due to the method and understands the incidence of irregular bleeding diminishes with time.  Time out was performed.  A Graves speculum was placed.  The cervix was prepped using Betadine. The uterus was found to be neutral and it sounded to 8 cm.  The cervix was grasped with a tenaculum and the IUD was inserted to 8 cm.  It was pulled back 1 cm and the IUD was disengaged.  The strings were trimmed to 3 cm.  Sonogram was performed and the proper placement of the IUD was verified.  The patient was instructed on signs and symptoms of infection and to check for the strings after each menses or each month.  The patient is to refrain from intercourse for 3 days.  The patient is scheduled for a return appointment after her first menses or 4 weeks.  CRESENZO-DISHMAN,Evans Levee 12/01/2015 2:47 PM

## 2015-12-29 ENCOUNTER — Ambulatory Visit: Payer: Medicaid Other | Admitting: Advanced Practice Midwife

## 2016-01-06 ENCOUNTER — Encounter: Payer: Self-pay | Admitting: Advanced Practice Midwife

## 2016-01-06 ENCOUNTER — Ambulatory Visit: Payer: Medicaid Other | Admitting: Advanced Practice Midwife

## 2016-01-08 ENCOUNTER — Encounter (HOSPITAL_COMMUNITY): Payer: Self-pay | Admitting: Emergency Medicine

## 2016-01-08 ENCOUNTER — Emergency Department (HOSPITAL_COMMUNITY)
Admission: EM | Admit: 2016-01-08 | Discharge: 2016-01-08 | Disposition: A | Discharge status: Home / Self Care | Payer: Medicaid Other | Attending: Emergency Medicine | Admitting: Emergency Medicine

## 2016-01-08 DIAGNOSIS — Z862 Personal history of diseases of the blood and blood-forming organs and certain disorders involving the immune mechanism: Secondary | ICD-10-CM | POA: Insufficient documentation

## 2016-01-08 DIAGNOSIS — R197 Diarrhea, unspecified: Secondary | ICD-10-CM | POA: Diagnosis present

## 2016-01-08 DIAGNOSIS — Z8742 Personal history of other diseases of the female genital tract: Secondary | ICD-10-CM | POA: Insufficient documentation

## 2016-01-08 DIAGNOSIS — Z3202 Encounter for pregnancy test, result negative: Secondary | ICD-10-CM | POA: Diagnosis not present

## 2016-01-08 DIAGNOSIS — Z87440 Personal history of urinary (tract) infections: Secondary | ICD-10-CM | POA: Diagnosis not present

## 2016-01-08 DIAGNOSIS — Z8679 Personal history of other diseases of the circulatory system: Secondary | ICD-10-CM | POA: Diagnosis not present

## 2016-01-08 DIAGNOSIS — Z8619 Personal history of other infectious and parasitic diseases: Secondary | ICD-10-CM | POA: Diagnosis not present

## 2016-01-08 DIAGNOSIS — K529 Noninfective gastroenteritis and colitis, unspecified: Secondary | ICD-10-CM | POA: Diagnosis not present

## 2016-01-08 LAB — CBC
HCT: 36.9 % (ref 36.0–46.0)
Hemoglobin: 11.8 g/dL — ABNORMAL LOW (ref 12.0–15.0)
MCH: 26.7 pg (ref 26.0–34.0)
MCHC: 32 g/dL (ref 30.0–36.0)
MCV: 83.5 fL (ref 78.0–100.0)
PLATELETS: 306 10*3/uL (ref 150–400)
RBC: 4.42 MIL/uL (ref 3.87–5.11)
RDW: 13.4 % (ref 11.5–15.5)
WBC: 5.8 10*3/uL (ref 4.0–10.5)

## 2016-01-08 LAB — URINALYSIS, ROUTINE W REFLEX MICROSCOPIC
Bilirubin Urine: NEGATIVE
Glucose, UA: NEGATIVE mg/dL
Hgb urine dipstick: NEGATIVE
KETONES UR: NEGATIVE mg/dL
Leukocytes, UA: NEGATIVE
Nitrite: NEGATIVE
PROTEIN: NEGATIVE mg/dL
Specific Gravity, Urine: 1.025 (ref 1.005–1.030)
pH: 6 (ref 5.0–8.0)

## 2016-01-08 LAB — COMPREHENSIVE METABOLIC PANEL
ALK PHOS: 55 U/L (ref 38–126)
ALT: 20 U/L (ref 14–54)
AST: 24 U/L (ref 15–41)
Albumin: 3.9 g/dL (ref 3.5–5.0)
Anion gap: 6 (ref 5–15)
BILIRUBIN TOTAL: 1 mg/dL (ref 0.3–1.2)
BUN: 8 mg/dL (ref 6–20)
CALCIUM: 8.9 mg/dL (ref 8.9–10.3)
CO2: 26 mmol/L (ref 22–32)
CREATININE: 0.83 mg/dL (ref 0.44–1.00)
Chloride: 104 mmol/L (ref 101–111)
Glucose, Bld: 83 mg/dL (ref 65–99)
Potassium: 4.2 mmol/L (ref 3.5–5.1)
Sodium: 136 mmol/L (ref 135–145)
Total Protein: 7.9 g/dL (ref 6.5–8.1)

## 2016-01-08 LAB — POC URINE PREG, ED: Preg Test, Ur: NEGATIVE

## 2016-01-08 MED ORDER — SODIUM CHLORIDE 0.9 % IV SOLN
1000.0000 mL | Freq: Once | INTRAVENOUS | Status: AC
  Administered 2016-01-08: 1000 mL via INTRAVENOUS

## 2016-01-08 MED ORDER — SODIUM CHLORIDE 0.9 % IV SOLN: 1000.0000 mL | INTRAVENOUS | Status: DC

## 2016-01-08 MED ORDER — ONDANSETRON HCL 4 MG/2ML IJ SOLN
4.0000 mg | Freq: Once | INTRAMUSCULAR | Status: AC
  Administered 2016-01-08: 4 mg via INTRAVENOUS
  Filled 2016-01-08: qty 2

## 2016-01-08 MED ORDER — PROMETHAZINE HCL 25 MG PO TABS: 25.0000 mg | ORAL_TABLET | Freq: Four times a day (QID) | ORAL | Status: DC | PRN

## 2016-01-08 NOTE — ED Provider Notes (Signed)
CSN: [id number]     Arrival date & time 01/08/16  4098 History  By signing my name below, I, Patricia [name], attest that this documentation has been prepared under the direction and in the presence of [name], MD. Electronically Signed: Ronney [name], ED Scribe. 01/08/2016. 1:10 PM.   Chief Complaint  Patient presents with  . Emesis  . Diarrhea   The history is provided by the patient. No language interpreter was used.    HPI Comments: Patricia [name] is a 25 y.o. female who presents to the Emergency Department complaining of constant, 10 episodes of vomiting and 3-4 episodes of loose, watery, diarrhea that began last night at 11 PM, about 13 hours ago. Patient notes she has had URI-like symptoms recently, including cough. Patient had taken Robitussin for her cold symptoms but has not taken anything for her vomiting or diarrhea. Patient does work at [employer], although she is unsure whether she has had sick contact there. She denies hematochezia, hematemesis, fever, or abdominal pain.  Past Medical History  Diagnosis Date  . Preterm labor without delivery in second trimester June 2012    Positive FFN in June  . UTI (lower urinary tract infection)   . Bacterial vaginosis   . Chlamydia   . Migraine   . Preterm labor   . Anemia    Past Surgical History  Procedure Laterality Date  . No past surgeries     Family History  Problem Relation Age of Onset  . Diabetes Other     great granmother  . Diabetes Paternal Grandfather   . Hypertension Paternal Grandfather   . Cancer Paternal Grandfather     prostate  . Miscarriages / Stillbirths Maternal Grandmother    Social History  Substance Use Topics  . Smoking status: Never Smoker   . Smokeless tobacco: Never Used  . Alcohol Use: No   OB History    Gravida Para Term Preterm AB TAB SAB Ectopic Multiple Living   0 3     [name] A complete 10 system review of systems was obtained and all systems are negative  except as noted in the HPI and PMH.    Allergies  Review of patient's allergies indicates no known allergies.  Home Medications   Prior to Admission medications   Medication Sig Start Date End Date Taking? Authorizing Provider  guaiFENesin-dextromethorphan (ROBITUSSIN DM) 100-10 MG/5ML syrup Take 10 mLs by mouth every 4 (four) hours as needed for cough.   Yes Historical Provider, MD  Prenatal Vit-Fe Fumarate-FA (MULTIVITAMIN-PRENATAL) 27-0.8 MG TABS tablet Take 1 tablet by mouth daily at 12 noon. Patient not taking: Reported on 01/08/2016 12/01/15   [name], CNM  promethazine (PHENERGAN) 25 MG tablet Take 1 tablet (25 mg total) by mouth every 6 (six) hours as needed for nausea or vomiting. 01/08/16   [name], MD   BP 128/68 mmHg  Pulse 72  Temp(Src) 98.3 F (36.8 C) (Oral)  Resp 14  Ht  (1.702 m)  Wt 131.09 kg  BMI 45.25 kg/m2  SpO2 91%  LMP 12/29/2015 Physical Exam  Constitutional: She appears well-developed and well-nourished. No distress.  HENT:  Head: Normocephalic and atraumatic.  Right Ear: External ear normal.  Left Ear: External ear normal.  Eyes: Conjunctivae are normal. Right eye exhibits no discharge. Left eye exhibits no discharge. No scleral icterus.  Neck: Neck supple. No tracheal deviation present.  Cardiovascular: Normal rate, regular rhythm  and intact distal pulses.   Pulmonary/Chest: Effort normal and breath sounds normal. No stridor. No respiratory distress. She has no wheezes. She has no rales.  Abdominal: Soft. Bowel sounds are normal. She exhibits no distension. There is no tenderness. There is no rebound and no guarding.  Musculoskeletal: She exhibits no edema or tenderness.  Neurological: She is alert. She has normal strength. No cranial nerve deficit (no facial droop, extraocular movements intact, no slurred speech) or sensory deficit. She exhibits normal muscle tone. She displays no seizure activity. Coordination normal.  Skin: Skin  is warm and dry. No rash noted.  Psychiatric: She has a normal mood and affect.  Nursing note and vitals reviewed.   ED Course  Procedures (including critical care time)  DIAGNOSTIC STUDIES: Oxygen Saturation is 100% on RA, normal by my interpretation.    COORDINATION OF CARE: 1:10 PM - Discussed treatment plan with pt at bedside which includes IV fluids and nausea medication. Pt verbalized understanding and agreed to plan.   Labs Review Labs Reviewed  CBC - Abnormal; Notable for the following:    Hemoglobin 11.8 (*)    All other components within normal limits  URINALYSIS, ROUTINE W REFLEX MICROSCOPIC (NOT AT [facility]) - Abnormal; Notable for the following:    APPearance HAZY (*)    All other components within normal limits  COMPREHENSIVE METABOLIC PANEL  POC URINE PREG, ED    MDM   Final diagnoses:  Gastroenteritis    Pt has a benign abdominal exam.  Labs are reassuring.  No dehydration.  Most likely viral GE. Pt given IV fluids and antinausea meds.  Dc home with phenergan. I personally performed the services described in this documentation, which was scribed in my presence.  The recorded information has been reviewed and is accurate.     [name], MD 01/08/16 1311

## 2016-01-08 NOTE — ED Notes (Signed)
Pt states that she has had vomiting and diarrhea since last night.  Denies abdominal pain.

## 2016-01-08 NOTE — Discharge Instructions (Signed)

## 2016-01-27 ENCOUNTER — Emergency Department (HOSPITAL_COMMUNITY)
Admission: EM | Admit: 2016-01-27 | Discharge: 2016-01-27 | Disposition: A | Discharge status: Home / Self Care | Payer: Medicaid Other | Attending: Emergency Medicine | Admitting: Emergency Medicine

## 2016-01-27 ENCOUNTER — Encounter (HOSPITAL_COMMUNITY): Payer: Self-pay | Admitting: *Deleted

## 2016-01-27 DIAGNOSIS — R112 Nausea with vomiting, unspecified: Secondary | ICD-10-CM

## 2016-01-27 DIAGNOSIS — R197 Diarrhea, unspecified: Secondary | ICD-10-CM | POA: Insufficient documentation

## 2016-01-27 DIAGNOSIS — Z8669 Personal history of other diseases of the nervous system and sense organs: Secondary | ICD-10-CM | POA: Insufficient documentation

## 2016-01-27 DIAGNOSIS — Z87448 Personal history of other diseases of urinary system: Secondary | ICD-10-CM | POA: Insufficient documentation

## 2016-01-27 DIAGNOSIS — Z862 Personal history of diseases of the blood and blood-forming organs and certain disorders involving the immune mechanism: Secondary | ICD-10-CM | POA: Insufficient documentation

## 2016-01-27 MED ORDER — ONDANSETRON 8 MG PO TBDP: 8.0000 mg | ORAL_TABLET | Freq: Three times a day (TID) | ORAL | Status: DC | PRN

## 2016-01-27 NOTE — ED Notes (Signed)
Pt states vomiting and diarrhea began two days ago. Last vomited at 0400. No diarrhea today. Denies pain today.

## 2016-01-27 NOTE — Discharge Instructions (Signed)

## 2016-01-27 NOTE — ED Provider Notes (Signed)
CSN: [id number]     Arrival date & time 01/27/16  1109 History   By signing my name below, I, Patricia [name], attest that this documentation has been prepared under the direction and in the presence of Margarita Grizzle, MD Electronically Signed: Charlean Merl, ED Scribe 01/27/2016 at 11:24 AM.    Chief Complaint  Patient presents with  . Emesis   The history is provided by the patient. No language interpreter was used.   HPI Comments: Patricia [name] is a 25 y.o. female who presents to the Emergency Department complaining of nausea and vomiting that began 2 days ago. Pt also c/o some diarrhea that began yesterday. The last episode of emesis occurred around 4AM today. Pt has been able to take liquids successfully since that time. Pt's children were sick 2 weeks ago, and works at [employer] with possible sick contacts. Pt denies any abdominal pain. Pt denies possibility of pregnancy. Patient has had subjective fever and chills. She denies abdominal pain  Past Medical History  Diagnosis Date  . Preterm labor without delivery in second trimester June 2012    Positive FFN in June  . UTI (lower urinary tract infection)   . Bacterial vaginosis   . Chlamydia   . Migraine   . Preterm labor   . Anemia    Past Surgical History  Procedure Laterality Date  . No past surgeries     Family History  Problem Relation Age of Onset  . Diabetes Other     great granmother  . Diabetes Paternal Grandfather   . Hypertension Paternal Grandfather   . Cancer Paternal Grandfather     prostate  . Miscarriages / Stillbirths Maternal Grandmother    Social History  Substance Use Topics  . Smoking status: Never Smoker   . Smokeless tobacco: Never Used  . Alcohol Use: No   OB History    Gravida Para Term Preterm AB TAB SAB Ectopic Multiple Living   0 3     [name]  Constitutional: Negative for fever.  Gastrointestinal: Positive for nausea, vomiting and diarrhea.  All  other systems reviewed and are negative.  Allergies  Review of patient's allergies indicates no known allergies.  Home Medications   Prior to Admission medications   Medication Sig Start Date End Date Taking? Authorizing Provider  ferrous sulfate 325 (65 FE) MG tablet Take 325 mg by mouth daily with breakfast.   Yes Historical Provider, MD  Prenatal Vit-Fe Fumarate-FA (MULTIVITAMIN-PRENATAL) 27-0.8 MG TABS tablet Take 1 tablet by mouth daily at 12 noon. 12/01/15  Yes [name], CNM  promethazine (PHENERGAN) 25 MG tablet Take 1 tablet (25 mg total) by mouth every 6 (six) hours as needed for nausea or vomiting. 01/08/16  Yes [name], MD   BP 119/68 mmHg  Pulse 77  Temp(Src) 98.6 F (37 C)  Resp 16  Ht  (1.702 m)  Wt 269 lb (122.018 kg)  BMI 42.12 kg/m2  SpO2 100%  LMP 12/29/2015 Physical Exam  Constitutional: She is oriented to person, place, and time. She appears well-developed and well-nourished. No distress.  HENT:  Head: Normocephalic and atraumatic.  Right Ear: External ear normal.  Left Ear: External ear normal.  Nose: Nose normal.  Eyes: Conjunctivae and EOM are normal. Pupils are equal, round, and reactive to light.  Neck: Normal range of motion. Neck supple.  Pulmonary/Chest: Effort normal.  Abdominal: Soft. Bowel sounds are normal. She exhibits  no distension and no mass. There is no tenderness. There is no rebound and no guarding.  Musculoskeletal: Normal range of motion.  Neurological: She is alert and oriented to person, place, and time. She exhibits normal muscle tone. Coordination normal.  Skin: Skin is warm and dry.  Psychiatric: She has a normal mood and affect. Her behavior is normal. Thought content normal.  Nursing note and vitals reviewed.   ED Course  Procedures  DIAGNOSTIC STUDIES: Oxygen Saturation is 100% on RA, normal by my interpretation.    COORDINATION OF CARE: 11:21 AM-Discussed treatment plan which includes antiemetics,  oral rehydration  with pt at bedside and pt agreed to plan.   Patient without vomiting here.  Tolerating liquids.  MDM   Final diagnoses:  Nausea vomiting and diarrhea    I personally performed the services described in this documentation, which was scribed in my presence. The recorded information has been reviewed and considered.     Margarita Grizzle, MD 01/27/16 1537

## 2016-07-06 ENCOUNTER — Emergency Department (HOSPITAL_COMMUNITY)
Admission: EM | Admit: 2016-07-06 | Discharge: 2016-07-06 | Disposition: A | Discharge status: Home / Self Care | Payer: Medicaid Other | Attending: Emergency Medicine | Admitting: Emergency Medicine

## 2016-07-06 ENCOUNTER — Encounter (HOSPITAL_COMMUNITY): Payer: Self-pay | Admitting: Emergency Medicine

## 2016-07-06 DIAGNOSIS — Z79899 Other long term (current) drug therapy: Secondary | ICD-10-CM | POA: Insufficient documentation

## 2016-07-06 DIAGNOSIS — R05 Cough: Secondary | ICD-10-CM

## 2016-07-06 DIAGNOSIS — R0981 Nasal congestion: Secondary | ICD-10-CM

## 2016-07-06 DIAGNOSIS — R059 Cough, unspecified: Secondary | ICD-10-CM

## 2016-07-06 DIAGNOSIS — J069 Acute upper respiratory infection, unspecified: Secondary | ICD-10-CM | POA: Insufficient documentation

## 2016-07-06 MED ORDER — GUAIFENESIN 100 MG/5ML PO SYRP: 100.0000 mg | ORAL_SOLUTION | ORAL | 0 refills | Status: DC | PRN

## 2016-07-06 NOTE — ED Triage Notes (Signed)
cough started on Tuesday.  C/o nasal congestion and non-productive couhg.  C/o headache, rates pain 8/10.

## 2016-07-06 NOTE — Discharge Instructions (Signed)
Take OTC Mucinex D as prescribed on the package and Zyrtec at night until your symptoms improve. Take the Tussinex at night as needed for your cough. Follow up with your primary care provider on Monday if your symptoms do not improve.   Return to the emergency department if you experience fever, chills, abdominal pain, sore throat, difficulty swallowing or opening your mouth, worsening headache, productive cough, or any other concerning symptoms.

## 2016-07-06 NOTE — ED Provider Notes (Signed)
[facility]-EMERGENCY DEPT Provider Note   CSN: [id number] Arrival date & time: 07/06/16  1224  First Provider Contact:  None       History   Chief Complaint Chief Complaint  Patient presents with  . Nasal Congestion    HPI Patricia [name] is a 25 y.o. female.  HPI   Patient is a 25 year old female who presents the emergency department with sinus congestion for 2 days. Associated runny nose, dry cough, scratchy throat that is worse with swallowing. Patient also endorses a intermittent throbbing frontal headache that is relieved with Tylenol. Patient denies fevers, chills, nausea, vomiting, neck pain, neck stiffness, chest pain, shortness of breath.  Past Medical History:  Diagnosis Date  . Anemia   . Bacterial vaginosis   . Chlamydia   . Migraine   . Preterm labor   . Preterm labor without delivery in second trimester June 2012   Positive FFN in June  . UTI (lower urinary tract infection)     Patient Active Problem List   Diagnosis Date Noted  . Encounter for initial prescription of intrauterine contraceptive device 12/01/2015  . History of preterm delivery 11/08/2015  . 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 History of gestational diabetes 11/08/2015  . Chlamydia infection affecting pregnancy in third trimester, antepartum 08/20/2015  . 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 Obesity 04/01/2013    Past Surgical History:  Procedure Laterality Date  . NO PAST SURGERIES      OB History    Gravida Para Term Preterm AB Living   SAB TAB Ectopic Multiple Live Births         0 3       Home Medications    Prior to Admission medications   Medication Sig Start Date End Date Taking? Authorizing Provider  ferrous sulfate 325 (65 FE) MG tablet Take 325 mg by mouth daily with breakfast.    Historical Provider, MD  guaifenesin (ROBITUSSIN) 100 MG/5ML syrup Take 5-10 mLs (100-200 mg total) by mouth every 4 (four) hours as needed for cough. 07/06/16   Shannen Vernon L Marieann Zipp, PA   ondansetron (ZOFRAN ODT) 8 MG disintegrating tablet Take 1 tablet (8 mg total) by mouth every 8 (eight) hours as needed for nausea or vomiting. 01/27/16   Margarita Grizzle, MD  Prenatal Vit-Fe Fumarate-FA (MULTIVITAMIN-PRENATAL) 27-0.8 MG TABS tablet Take 1 tablet by mouth daily at 12 noon. 12/01/15   [name], CNM  promethazine (PHENERGAN) 25 MG tablet Take 1 tablet (25 mg total) by mouth every 6 (six) hours as needed for nausea or vomiting. 01/08/16   [name], MD    Family History Family History  Problem Relation Age of Onset  . Diabetes Other     great granmother  . Diabetes Paternal Grandfather   . Hypertension Paternal Grandfather   . Cancer Paternal Grandfather     prostate  . Miscarriages / Stillbirths Maternal Grandmother     Social History Social History  Substance Use Topics  . Smoking status: Never Smoker  . Smokeless tobacco: Never Used  . Alcohol use No     Allergies   Review of patient's allergies indicates no known allergies.   [name] [name]  Constitutional: Negative for chills and fever.  HENT: Positive for congestion, rhinorrhea and sinus pressure. Negative for ear pain, postnasal drip and trouble swallowing.   Respiratory: Positive for cough. Negative for shortness of breath.   Cardiovascular: Negative for chest pain.  Gastrointestinal: Negative for abdominal pain, nausea and vomiting.     Physical Exam Updated Vital Signs BP 121/78 (BP Location: Left Arm)   Pulse 76   Temp 99.1 F (37.3 C) (Oral)   Resp 16   Ht 5\' 7"  (1.702 m)   Wt 121.6 kg   LMP 06/10/2016 (Exact Date)   SpO2 98%   BMI 41.97 kg/m   Physical Exam  Constitutional: She appears well-developed and well-nourished. No distress.  HENT:  Head: Normocephalic and atraumatic.  Nose: Mucosal edema and rhinorrhea present. Right sinus exhibits frontal sinus tenderness. Left sinus exhibits frontal sinus tenderness.  Mouth/Throat: Uvula is midline,  oropharynx is clear and moist and mucous membranes are normal. No trismus in the jaw. No uvula swelling. No oropharyngeal exudate, posterior oropharyngeal edema, posterior oropharyngeal erythema or tonsillar abscesses.  Eyes: Conjunctivae are normal.  Cardiovascular: Normal rate, regular rhythm and normal heart sounds.  Exam reveals no gallop and no friction rub.   No murmur heard. Pulmonary/Chest: Effort normal and breath sounds normal. No respiratory distress. She has no decreased breath sounds. She has no wheezes. She has no rhonchi. She has no rales.  Musculoskeletal: Normal range of motion.  Neurological: She is alert. Coordination normal.  Skin: Skin is warm and dry. She is not diaphoretic.  Psychiatric: She has a normal mood and affect. Her behavior is normal.  Nursing note and vitals reviewed.    ED Treatments / Results  Labs (all labs ordered are listed, but only abnormal results are displayed) Labs Reviewed - No data to display  EKG  EKG Interpretation None       Radiology No results found.  Procedures Procedures (including critical care time)  Medications Ordered in ED Medications - No data to display   Initial Impression / Assessment and Plan / ED Course  I have reviewed the triage vital signs and the nursing notes.  Pertinent labs & imaging results that were available during my care of the patient were reviewed by me and considered in my medical decision making (see chart for details).  Clinical Course   Patients symptoms are consistent with URI, likely viral etiology. Discussed that antibiotics are not indicated for viral infections. Pt will be discharged with symptomatic treatment.  Instructed patient to follow-up with her PCP in 3-4 days if symptoms are not improving. Discussed strict return precautions to the ED if symptoms worsen. Verbalizes understanding and is agreeable with plan. Patient afebrile, VSS. Pt is hemodynamically stable & in NAD prior to  dc.   Final Clinical Impressions(s) / ED Diagnoses   Final diagnoses:  Nasal congestion  Cough  URI (upper respiratory infection)    New Prescriptions Discharge Medication List as of 07/06/2016  2:00 PM    START taking these medications   Details  guaifenesin (ROBITUSSIN) 100 MG/5ML syrup Take 5-10 mLs (100-200 mg total) by mouth every 4 (four) hours as needed for cough., Starting Thu 07/06/2016, Print         Joyce CopaJessica L SpencerportFocht, [place]PA 07/06/16 1720    Marily MemosJason Mesner, MD 07/08/16 1556

## 2016-09-19 ENCOUNTER — Encounter (HOSPITAL_COMMUNITY): Payer: Self-pay | Admitting: Emergency Medicine

## 2016-09-19 ENCOUNTER — Emergency Department (HOSPITAL_COMMUNITY)
Admission: EM | Admit: 2016-09-19 | Discharge: 2016-09-19 | Disposition: A | Discharge status: Home / Self Care | Payer: Self-pay | Attending: Emergency Medicine | Admitting: Emergency Medicine

## 2016-09-19 DIAGNOSIS — R112 Nausea with vomiting, unspecified: Secondary | ICD-10-CM | POA: Insufficient documentation

## 2016-09-19 DIAGNOSIS — Z79899 Other long term (current) drug therapy: Secondary | ICD-10-CM | POA: Insufficient documentation

## 2016-09-19 DIAGNOSIS — R197 Diarrhea, unspecified: Secondary | ICD-10-CM | POA: Insufficient documentation

## 2016-09-19 LAB — URINALYSIS, ROUTINE W REFLEX MICROSCOPIC
BILIRUBIN URINE: NEGATIVE
GLUCOSE, UA: NEGATIVE mg/dL
HGB URINE DIPSTICK: NEGATIVE
Ketones, ur: NEGATIVE mg/dL
Leukocytes, UA: NEGATIVE
Nitrite: NEGATIVE
PROTEIN: NEGATIVE mg/dL
pH: 6 (ref 5.0–8.0)

## 2016-09-19 LAB — COMPREHENSIVE METABOLIC PANEL
ALBUMIN: 4.2 g/dL (ref 3.5–5.0)
ALT: 21 U/L (ref 14–54)
ANION GAP: 5 (ref 5–15)
AST: 23 U/L (ref 15–41)
Alkaline Phosphatase: 45 U/L (ref 38–126)
BILIRUBIN TOTAL: 0.6 mg/dL (ref 0.3–1.2)
BUN: 13 mg/dL (ref 6–20)
CO2: 27 mmol/L (ref 22–32)
Calcium: 9.1 mg/dL (ref 8.9–10.3)
Chloride: 104 mmol/L (ref 101–111)
Creatinine, Ser: 1.19 mg/dL — ABNORMAL HIGH (ref 0.44–1.00)
GFR calc non Af Amer: >60 mL/min (ref >60)
Glucose, Bld: 89 mg/dL (ref 65–99)
POTASSIUM: 3.7 mmol/L (ref 3.5–5.1)
Sodium: 136 mmol/L (ref 135–145)
TOTAL PROTEIN: 7.8 g/dL (ref 6.5–8.1)

## 2016-09-19 LAB — CBC
HCT: 37.9 % (ref 36.0–46.0)
Hemoglobin: 12.4 g/dL (ref 12.0–15.0)
MCH: 27.4 pg (ref 26.0–34.0)
MCHC: 32.7 g/dL (ref 30.0–36.0)
MCV: 83.7 fL (ref 78.0–100.0)
PLATELETS: 248 10*3/uL (ref 150–400)
RBC: 4.53 MIL/uL (ref 3.87–5.11)
RDW: 13.7 % (ref 11.5–15.5)
WBC: 6.9 10*3/uL (ref 4.0–10.5)

## 2016-09-19 LAB — PREGNANCY, URINE: PREG TEST UR: NEGATIVE

## 2016-09-19 LAB — LIPASE, BLOOD: Lipase: 28 U/L (ref 11–51)

## 2016-09-19 MED ORDER — ONDANSETRON HCL 4 MG PO TABS: 4.0000 mg | ORAL_TABLET | Freq: Three times a day (TID) | ORAL | 0 refills | Status: DC | PRN

## 2016-09-19 NOTE — ED Triage Notes (Signed)
Pt reports n/v/d, chest pain and fever. Pt reports chest pain occurs when vomiting.

## 2016-09-19 NOTE — ED Provider Notes (Signed)
[facility]Y DEPT Provider Note   CSN: [id number] Arrival date & time: 09/19/16  1840     History   Chief Complaint Chief Complaint  Patient presents with  . Emesis    HPI Patricia [name] is a 25 y.o. female.  HPI  Pt was seen at 2000. Per pt, c/o gradual onset and persistence of multiple intermittent episodes of N/V/D that began yesterday.   Describes the stools as "watery." Has been associated with subjective fevers/chills. Endorses others in household with viral illnesses. Denies abd pain, no CP/SOB, no back pain, no black or blood in stools or emesis.    Past Medical History:  Diagnosis Date  . Anemia   . Bacterial vaginosis   . Chlamydia   . Migraine   . Preterm labor   . Preterm labor without delivery in second trimester June 2012   Positive FFN in June  . UTI (lower urinary tract infection)     Patient Active Problem List   Diagnosis Date Noted  . Encounter for initial prescription of intrauterine contraceptive device 12/01/2015  . History of preterm delivery 11/08/2015  . 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 History of gestational diabetes 11/08/2015  . Chlamydia infection affecting pregnancy in third trimester, antepartum 08/20/2015  . 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 Obesity 04/01/2013    Past Surgical History:  Procedure Laterality Date  . NO PAST SURGERIES      OB History    Gravida Para Term Preterm AB Living   3 3 2 1   3    SAB TAB Ectopic Multiple Live Births         0 3       Home Medications    Prior to Admission medications   Medication Sig Start Date End Date Taking? Authorizing Provider  bismuth subsalicylate (PEPTO BISMOL) 262 MG/15ML suspension Take 30 mLs by mouth every 6 (six) hours as needed for diarrhea or loose stools.   Yes Historical Provider, MD  ferrous sulfate 325 (65 FE) MG tablet Take 325 mg by mouth daily with breakfast.   Yes Historical Provider, MD  Prenatal Vit-Fe Fumarate-FA (MULTIVITAMIN-PRENATAL) 27-0.8 MG  TABS tablet Take 1 tablet by mouth daily at 12 noon. 12/01/15  Yes [name], CNM  guaifenesin (ROBITUSSIN) 100 MG/5ML syrup Take 5-10 mLs (100-200 mg total) by mouth every 4 (four) hours as needed for cough. Patient not taking: Reported on 09/19/2016 07/06/16   Joyce Copa Focht, PA  ondansetron (ZOFRAN ODT) 8 MG disintegrating tablet Take 1 tablet (8 mg total) by mouth every 8 (eight) hours as needed for nausea or vomiting. Patient not taking: Reported on 09/19/2016 01/27/16   Margarita Grizzle, MD    Family History Family History  Problem Relation Age of Onset  . Diabetes Other     great granmother  . Diabetes Paternal Grandfather   . Hypertension Paternal Grandfather   . Cancer Paternal Grandfather     prostate  . Miscarriages / Stillbirths Maternal Grandmother     Social History Social History  Substance Use Topics  . Smoking status: Never Smoker  . Smokeless tobacco: Never Used  . Alcohol use No     Allergies   Review of patient's allergies indicates no known allergies.   [name] [name] ROS: Statement: All systems negative except as marked or noted in the HPI; Constitutional: Negative for objective fever and chills. ; ; Eyes: Negative for eye pain, redness and discharge. ; ; ENMT: Negative for ear pain, hoarseness, nasal  congestion, sinus pressure and sore throat. ; ; Cardiovascular: Negative for chest pain, palpitations, diaphoresis, dyspnea and peripheral edema. ; ; Respiratory: Negative for cough, wheezing and stridor. ; ; Gastrointestinal: +N/V/D. Negative for abdominal pain, blood in stool, hematemesis, jaundice and rectal bleeding. . ; ; Genitourinary: Negative for dysuria, flank pain and hematuria. ; ; Musculoskeletal: Negative for back pain and neck pain. Negative for swelling and trauma.; ; Skin: Negative for pruritus, rash, abrasions, blisters, bruising and skin lesion.; ; Neuro: Negative for headache, lightheadedness and neck stiffness.  Negative for weakness, altered level of consciousness, altered mental status, extremity weakness, paresthesias, involuntary movement, seizure and syncope.       Physical Exam Updated Vital Signs BP 122/79 (BP Location: Right Arm)   Pulse 99   Temp 98.5 F (36.9 C) (Oral)   Resp 18   Ht 5\' 7"  (1.702 m)   Wt 268 lb (121.6 kg)   LMP 06/26/2016   SpO2 99%   BMI 41.97 kg/m   Physical Exam 2005: Physical examination:  Nursing notes reviewed; Vital signs and O2 SAT reviewed;  Constitutional: Well developed, Well nourished, Well hydrated, In no acute distress; Head:  Normocephalic, atraumatic; Eyes: EOMI, PERRL, No scleral icterus; ENMT: Mouth and pharynx normal, Mucous membranes moist; Neck: Supple, Full range of motion, No lymphadenopathy; Cardiovascular: Regular rate and rhythm, No murmur, rub, or gallop; Respiratory: Breath sounds clear & equal bilaterally, No rales, rhonchi, wheezes.  Speaking full sentences with ease, Normal respiratory effort/excursion; Chest: Nontender, Movement normal; Abdomen: Soft, Nontender, Nondistended, Normal bowel sounds; Genitourinary: No CVA tenderness; Extremities: Pulses normal, No tenderness, No edema, No calf edema or asymmetry.; Neuro: AA&Ox3, Major CN grossly intact.  Speech clear. No gross focal motor or sensory deficits in extremities.; Skin: Color normal, Warm, Dry.   ED Treatments / Results  Labs (all labs ordered are listed, but only abnormal results are displayed)   EKG  EKG Interpretation None       Radiology   Procedures Procedures (including critical care time)  Medications Ordered in ED Medications - No data to display   Initial Impression / Assessment and Plan / ED Course  I have reviewed the triage vital signs and the nursing notes.  Pertinent labs & imaging results that were available during my care of the patient were reviewed by me and considered in my medical decision making (see chart for details).  MDM Reviewed:  previous chart, nursing note and vitals Reviewed previous: labs Interpretation: labs   Results for orders placed or performed during the hospital encounter of 09/19/16  Lipase, blood  Result Value Ref Range   Lipase 28 11 - 51 U/L  Comprehensive metabolic panel  Result Value Ref Range   Sodium 136 135 - 145 mmol/L   Potassium 3.7 3.5 - 5.1 mmol/L   Chloride 104 101 - 111 mmol/L   CO2 27 22 - 32 mmol/L   Glucose, Bld 89 65 - 99 mg/dL   BUN 13 6 - 20 mg/dL   Creatinine, Ser 4.781.19 (H) 0.44 - 1.00 mg/dL   Calcium 9.1 8.9 - 29.510.3 mg/dL   Total Protein 7.8 6.5 - 8.1 g/dL   Albumin 4.2 3.5 - 5.0 g/dL   AST 23 15 - 41 U/L   ALT 21 14 - 54 U/L   Alkaline Phosphatase 45 38 - 126 U/L   Total Bilirubin 0.6 0.3 - 1.2 mg/dL   GFR calc non Af Amer >60 >60 mL/min   GFR calc Af Amer >60 >60  mL/min   Anion gap 5 5 - 15  CBC  Result Value Ref Range   WBC 6.9 4.0 - 10.5 K/uL   RBC 4.53 3.87 - 5.11 MIL/uL   Hemoglobin 12.4 12.0 - 15.0 g/dL   HCT 16.1 09.6 - 04.5 %   MCV 83.7 78.0 - 100.0 fL   MCH 27.4 26.0 - 34.0 pg   MCHC 32.7 30.0 - 36.0 g/dL   RDW 40.9 81.1 - 91.4 %   Platelets 248 150 - 400 K/uL  Urinalysis, Routine w reflex microscopic  Result Value Ref Range   Color, Urine YELLOW YELLOW   APPearance CLEAR CLEAR   Specific Gravity, Urine >1.030 (H) 1.005 - 1.030   pH 6.0 5.0 - 8.0   Glucose, UA NEGATIVE NEGATIVE mg/dL   Hgb urine dipstick NEGATIVE NEGATIVE   Bilirubin Urine NEGATIVE NEGATIVE   Ketones, ur NEGATIVE NEGATIVE mg/dL   Protein, ur NEGATIVE NEGATIVE mg/dL   Nitrite NEGATIVE NEGATIVE   Leukocytes, UA NEGATIVE NEGATIVE  Pregnancy, urine  Result Value Ref Range   Preg Test, Ur NEGATIVE NEGATIVE    2100:  Pt has tol PO well while in the ED without N/V.  No stooling while in the ED.  Abd remains benign, VSS. Feels better and wants to go home now. Tx symptomatically, f/u PMD. Dx and testing d/w pt.  Questions answered.  Verb understanding, agreeable to d/c home with outpt  f/u.     Final Clinical Impressions(s) / ED Diagnoses   Final diagnoses:  None    New Prescriptions New Prescriptions   No medications on file     Samuel Jester, DO 09/23/16 7829

## 2016-09-19 NOTE — ED Notes (Signed)
PO challenge completed. Pt states no N/V/D.

## 2016-09-19 NOTE — Discharge Instructions (Signed)
Take the prescription as directed.  Increase your fluid intake (ie:  Gatoraide) for the next few days, as discussed.  Eat a bland diet and advance to your regular diet slowly as you can tolerate it.   Avoid full strength juices, as well as milk and milk products until your diarrhea has resolved.   Call your regular medical doctor tomorrow to schedule a follow up appointment this week.  Return to the Emergency Department immediately if not improving (or even worsening) despite taking the medicines as prescribed, any black or bloody stool or vomit, or for any other concerns. ° °

## 2016-09-19 NOTE — ED Notes (Signed)
Pt states understanding of care given and follow up instructions.  Pt ambulated from Ed, alert, oriented x 4 and steady gait carrying child

## 2016-09-19 NOTE — ED Notes (Signed)
Given sprite.

## 2016-11-03 ENCOUNTER — Encounter (HOSPITAL_COMMUNITY): Payer: Self-pay | Admitting: Cardiology

## 2016-11-03 ENCOUNTER — Emergency Department (HOSPITAL_COMMUNITY): Payer: Self-pay

## 2016-11-03 ENCOUNTER — Emergency Department (HOSPITAL_COMMUNITY)
Admission: EM | Admit: 2016-11-03 | Discharge: 2016-11-03 | Disposition: A | Discharge status: Home / Self Care | Payer: Self-pay | Attending: Emergency Medicine | Admitting: Emergency Medicine

## 2016-11-03 DIAGNOSIS — J40 Bronchitis, not specified as acute or chronic: Secondary | ICD-10-CM | POA: Insufficient documentation

## 2016-11-03 MED ORDER — IBUPROFEN 400 MG PO TABS
600.0000 mg | ORAL_TABLET | Freq: Once | ORAL | Status: AC
  Administered 2016-11-03: 600 mg via ORAL
  Filled 2016-11-03: qty 2

## 2016-11-03 MED ORDER — ALBUTEROL SULFATE HFA 108 (90 BASE) MCG/ACT IN AERS
2.0000 | INHALATION_SPRAY | Freq: Once | RESPIRATORY_TRACT | Status: AC
  Administered 2016-11-03: 2 via RESPIRATORY_TRACT
  Filled 2016-11-03: qty 6.7

## 2016-11-03 NOTE — ED Triage Notes (Signed)
Chest congestion and cough since yesterday.

## 2016-11-03 NOTE — ED Provider Notes (Signed)
[facility] Provider Note   CSN: [id number] Arrival date & time: 11/03/16  1012  By signing my name below, I, Rosario AdieWilliam Andrew Hiatt, attest that this documentation has been prepared under the direction and in the presence of Azalia BilisKevin Burwell Bethel, MD. Electronically Signed: Rosario AdieWilliam Andrew Hiatt, ED Scribe. 11/03/16. 10:26 AM.  History   Chief Complaint Chief Complaint  Patient presents with  . Cough   The history is provided by the patient. No language interpreter was used.    HPI Comments: Patricia [name] is a 25 y.o. female who presents to the Emergency Department complaining of gradually worsening, persistent productive cough which began one day ago. She notes associated hoarse voice change, congestion, and sore throat secondary to the onset of her cough. Pt additionally notes that she has right-sided headache and sore-like chest pain secondary to coughing only. She has been taking Robitussin at home with minimal relief of her symptoms. No h/o asthma. Pt is able to tolerate PO intake well and without difficulty. Denies fever, or any other associated symptoms.   Past Medical History:  Diagnosis Date  . Anemia   . Bacterial vaginosis   . Chlamydia   . Migraine   . Preterm labor   . Preterm labor without delivery in second trimester June 2012   Positive FFN in June  . UTI (lower urinary tract infection)    Patient Active Problem List   Diagnosis Date Noted  . Encounter for initial prescription of intrauterine contraceptive device 12/01/2015  . History of preterm delivery 11/08/2015  . 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 History of gestational diabetes 11/08/2015  . Chlamydia infection affecting pregnancy in third trimester, antepartum 08/20/2015  . 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 Obesity 04/01/2013   Past Surgical History:  Procedure Laterality Date  . NO PAST SURGERIES     OB History    Gravida Para Term Preterm AB Living   3 3 2 1   3    SAB TAB Ectopic Multiple Live  Births         0 3     Home Medications    Prior to Admission medications   Medication Sig Start Date End Date Taking? Authorizing Provider  bismuth subsalicylate (PEPTO BISMOL) 262 MG/15ML suspension Take 30 mLs by mouth every 6 (six) hours as needed for diarrhea or loose stools.    Historical Provider, MD  ferrous sulfate 325 (65 FE) MG tablet Take 325 mg by mouth daily with breakfast.    Historical Provider, MD  guaifenesin (ROBITUSSIN) 100 MG/5ML syrup Take 5-10 mLs (100-200 mg total) by mouth every 4 (four) hours as needed for cough. Patient not taking: Reported on 09/19/2016 07/06/16   Joyce CopaJessica L Focht, PA  ondansetron (ZOFRAN ODT) 8 MG disintegrating tablet Take 1 tablet (8 mg total) by mouth every 8 (eight) hours as needed for nausea or vomiting. Patient not taking: Reported on 09/19/2016 01/27/16   Margarita Grizzleanielle Ray, MD  ondansetron (ZOFRAN) 4 MG tablet Take 1 tablet (4 mg total) by mouth every 8 (eight) hours as needed for nausea or vomiting. 09/19/16   Samuel JesterKathleen McManus, DO  Prenatal Vit-Fe Fumarate-FA (MULTIVITAMIN-PRENATAL) 27-0.8 MG TABS tablet Take 1 tablet by mouth daily at 12 noon. 12/01/15   Jacklyn ShellFrances Cresenzo-Dishmon, CNM   Family History Family History  Problem Relation Age of Onset  . Diabetes Other     great granmother  . Diabetes Paternal Grandfather   . Hypertension Paternal Grandfather   . Cancer Paternal Grandfather     prostate  .  Miscarriages / Stillbirths Maternal Grandmother    Social History Social History  Substance Use Topics  . Smoking status: Never Smoker  . Smokeless tobacco: Never Used  . Alcohol use No   Allergies   Patient has no known allergies.  [name] [name] A complete 10 system review of systems was obtained and all systems are negative except as noted in the HPI and PMH.   Physical Exam Updated Vital Signs BP 121/72 (BP Location: Right Wrist)   Pulse 81   Temp 98.5 F (36.9 C) (Oral)   Resp 16   Ht 5\' 7"  (1.702 m)    Wt 287 lb (130.2 kg)   LMP 10/01/2016   SpO2 100%   BMI 44.95 kg/m   Physical Exam  Constitutional: She is oriented to person, place, and time. She appears well-developed and well-nourished. No distress.  HENT:  Head: Normocephalic and atraumatic.  Uvula midline. Posterior oropharynx without erythema. Tolerating secretions well.   Eyes: EOM are normal.  Neck: Normal range of motion.  Cardiovascular: Normal rate, regular rhythm and normal heart sounds.   Pulmonary/Chest: Effort normal and breath sounds normal.  Abdominal: Soft. She exhibits no distension. There is no tenderness.  Musculoskeletal: Normal range of motion.  Neurological: She is alert and oriented to person, place, and time.  Skin: Skin is warm and dry.  Psychiatric: She has a normal mood and affect. Judgment normal.  Nursing note and vitals reviewed.  ED Treatments / Results  DIAGNOSTIC STUDIES: Oxygen Saturation is 100% on RA, normal by my interpretation.   COORDINATION OF CARE: 10:26 AM-Discussed next steps with pt. Pt verbalized understanding and is agreeable with the plan.   Labs (all labs ordered are listed, but only abnormal results are displayed) Labs Reviewed - No data to display  EKG  EKG Interpretation None      Radiology Dg Chest 2 View  Result Date: 11/03/2016 CLINICAL DATA:  Cough, congestion since yesterday EXAM: CHEST  2 VIEW COMPARISON:  06/23/2014 FINDINGS: Heart and mediastinal contours are within normal limits. No focal opacities or effusions. No acute bony abnormality. IMPRESSION: No active cardiopulmonary disease. Electronically Signed   By: Charlett NoseKevin  Dover M.D.   On: 11/03/2016 11:32    Procedures Procedures   Medications Ordered in ED Medications - No data to display  Initial Impression / Assessment and Plan / ED Course  I have reviewed the triage vital signs and the nursing notes.  Pertinent labs & imaging results that were available during my care of the patient were reviewed  by me and considered in my medical decision making (see chart for details).  Clinical Course    Likely viral URI/bronchitis. Home with albuterol MDI for cough. No signs of PTA. Doubt deep space infection  Pt understands to return to the ER for new or worsening symptoms  Final Clinical Impressions(s) / ED Diagnoses   Final diagnoses:  Bronchitis   New Prescriptions New Prescriptions   No medications on file   I personally performed the services described in this documentation, which was scribed in my presence. The recorded information has been reviewed and is accurate.       Azalia BilisKevin Marra Fraga, MD 11/03/16 1145

## 2017-02-27 ENCOUNTER — Emergency Department (HOSPITAL_COMMUNITY): Payer: Self-pay

## 2017-02-27 ENCOUNTER — Emergency Department (HOSPITAL_COMMUNITY)
Admission: EM | Admit: 2017-02-27 | Discharge: 2017-02-27 | Disposition: A | Discharge status: Home / Self Care | Payer: Self-pay | Attending: Emergency Medicine | Admitting: Emergency Medicine

## 2017-02-27 ENCOUNTER — Encounter (HOSPITAL_COMMUNITY): Payer: Self-pay | Admitting: Emergency Medicine

## 2017-02-27 DIAGNOSIS — B9789 Other viral agents as the cause of diseases classified elsewhere: Secondary | ICD-10-CM

## 2017-02-27 DIAGNOSIS — J069 Acute upper respiratory infection, unspecified: Secondary | ICD-10-CM | POA: Insufficient documentation

## 2017-02-27 LAB — PREGNANCY, URINE: Preg Test, Ur: NEGATIVE

## 2017-02-27 MED ORDER — BENZONATATE 100 MG PO CAPS: 200.0000 mg | ORAL_CAPSULE | Freq: Three times a day (TID) | ORAL | 0 refills | Status: DC | PRN

## 2017-02-27 MED ORDER — LORATADINE-PSEUDOEPHEDRINE ER 5-120 MG PO TB12: 1.0000 | ORAL_TABLET | Freq: Two times a day (BID) | ORAL | 0 refills | Status: DC

## 2017-02-27 NOTE — ED Provider Notes (Signed)
[facility] Provider Note   CSN: [id number] Arrival date & time: 02/27/17  1744     History   Chief Complaint Chief Complaint  Patient presents with  . Cough    HPI Patricia [name] is a 26 y.o. female presenting with 5 day history of uri type symptoms which includes nasal congestion with clear rhinorrhea, sore throat, low grade fever and nonproductive cough.  Symptoms do not include shortness of breath, chest pain,  Nausea, vomiting or diarrhea.  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 The patient has taken tylenol and ibuprofen prior to arrival with some improvement in symptoms. .  The history is provided by the patient.    Past Medical History:  Diagnosis Date  . Anemia   . Bacterial vaginosis   . Chlamydia   . Migraine   . Preterm labor   . Preterm labor without delivery in second trimester June 2012   Positive FFN in June  . UTI (lower urinary tract infection)     Patient Active Problem List   Diagnosis Date Noted  . Encounter for initial prescription of intrauterine contraceptive device 12/01/2015  . History of preterm delivery 11/08/2015  . 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 History of gestational diabetes 11/08/2015  . Chlamydia infection affecting pregnancy in third trimester, antepartum 08/20/2015  . 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 Obesity 04/01/2013    Past Surgical History:  Procedure Laterality Date  . NO PAST SURGERIES      OB History    Gravida Para Term Preterm AB Living   SAB TAB Ectopic Multiple Live Births         0 3       Home Medications    Prior to Admission medications   Medication Sig Start Date End Date Taking? Authorizing Provider  benzonatate (TESSALON) 100 MG capsule Take 2 capsules (200 mg total) by mouth 3 (three) times daily as needed. 02/27/17   Burgess Amor, PA-C  bismuth subsalicylate (PEPTO BISMOL) 262  MG/15ML suspension Take 30 mLs by mouth every 6 (six) hours as needed for diarrhea or loose stools.    Historical Provider, MD  loratadine-pseudoephedrine (CLARITIN-D 12 HOUR) 5-120 MG tablet Take 1 tablet by mouth 2 (two) times daily. 02/27/17   Burgess Amor, PA-C    Family History Family History  Problem Relation Age of Onset  . Diabetes Other     great granmother  . Diabetes Paternal Grandfather   . Hypertension Paternal Grandfather   . Cancer Paternal Grandfather     prostate  . Miscarriages / Stillbirths Maternal Grandmother     Social History Social History  Substance Use Topics  . Smoking status: Never Smoker  . Smokeless tobacco: Never Used  . Alcohol use Yes     Comment: occasionally     Allergies   Patient has no known allergies.   [name] [name]  Constitutional: Positive for chills and fever.  HENT: Positive for congestion, rhinorrhea and sore throat. Negative for ear pain, sinus pressure, trouble swallowing and voice change.   Eyes: Negative for discharge.  Respiratory: Positive for cough. Negative for shortness of breath, wheezing and stridor.   Cardiovascular: Negative for chest pain.  Gastrointestinal: Negative for abdominal pain.  Genitourinary: Negative.      Physical Exam Updated Vital Signs BP 125/62 (BP Location: Left Arm)  Pulse 97   Temp 99.6 F (37.6 C) (Oral)   Resp 20   Ht  (1.702 m)   Wt (!) 138.3 kg   SpO2 99%   BMI 47.77 kg/m   Physical Exam  Constitutional: She is oriented to person, place, and time. She appears well-developed and well-nourished.  HENT:  Head: Normocephalic and atraumatic.  Right Ear: Tympanic membrane and ear canal normal.  Left Ear: Tympanic membrane and ear canal normal.  Nose: Mucosal edema and rhinorrhea present.  Mouth/Throat: Uvula is midline, oropharynx is clear and moist and mucous membranes are normal. No oropharyngeal exudate, posterior oropharyngeal edema, posterior  oropharyngeal erythema or tonsillar abscesses. No tonsillar exudate.  Eyes: Conjunctivae are normal.  Cardiovascular: Normal rate and normal heart sounds.   Pulmonary/Chest: Effort normal. No respiratory distress. She has no wheezes. She has no rales.  Abdominal: Soft. She exhibits no distension. There is no tenderness. There is no guarding.  Musculoskeletal: Normal range of motion.  Neurological: She is alert and oriented to person, place, and time.  Skin: Skin is warm and dry. No rash noted.  Psychiatric: She has a normal mood and affect.     ED Treatments / Results  Labs (all labs ordered are listed, but only abnormal results are displayed) Labs Reviewed  PREGNANCY, URINE    EKG  EKG Interpretation None       Radiology Dg Chest 2 View  Result Date: 02/27/2017 CLINICAL DATA:  Cough EXAM: CHEST  2 VIEW COMPARISON:  11/03/2016 FINDINGS: The heart size and mediastinal contours are within normal limits. Both lungs are clear. The visualized skeletal structures are unremarkable. IMPRESSION: No active cardiopulmonary disease. Electronically Signed   By: Tollie Eth M.D.   On: 02/27/2017 18:42    Procedures Procedures (including critical care time)  Medications Ordered in ED Medications - No data to display   Initial Impression / Assessment and Plan / ED Course  I have reviewed the triage vital signs and the nursing notes.  Pertinent labs & imaging results that were available during my care of the patient were reviewed by me and considered in my medical decision making (see chart for details).     Pt with viral syndrome,  Possible influenza, but more likely viral uri.  Rest, increased fluids, tylenol/motrin as she is currently doing, prn f/u anticipated.  The patient appears reasonably screened and/or stabilized for discharge and I doubt any other medical condition or other [facility] requiring further screening, evaluation, or treatment in the ED at this time prior to  discharge.   Final Clinical Impressions(s) / ED Diagnoses   Final diagnoses:  Viral URI with cough    New Prescriptions Discharge Medication List as of 02/27/2017  8:13 PM    START taking these medications   Details  benzonatate (TESSALON) 100 MG capsule Take 2 capsules (200 mg total) by mouth 3 (three) times daily as needed., Starting Tue 02/27/2017, Print    loratadine-pseudoephedrine (CLARITIN-D 12 HOUR) 5-120 MG tablet Take 1 tablet by mouth 2 (two) times daily., Starting Tue 02/27/2017, Print         Burgess Amor, PA-C 02/28/17 1610    Vanetta Mulders, MD 02/28/17 1757

## 2017-02-27 NOTE — ED Triage Notes (Signed)
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 Complaining of cough and fever x 1 week. States she had vomiting yesterday but has had no vomiting today.

## 2017-02-27 NOTE — Discharge Instructions (Signed)
As discussed, rest and make sure you are drinking plenty of fluids.  You may continue taking your tylenol alternating with ibuprofen every 6 hours as discussed.

## 2017-11-27 DIAGNOSIS — O039 Complete or unspecified spontaneous abortion without complication: Secondary | ICD-10-CM

## 2017-11-27 HISTORY — DX: Complete or unspecified spontaneous abortion without complication: O03.9

## 2018-01-02 ENCOUNTER — Ambulatory Visit: Payer: Self-pay | Admitting: Adult Health

## 2018-01-08 ENCOUNTER — Ambulatory Visit: Payer: Self-pay | Admitting: Adult Health

## 2018-01-18 ENCOUNTER — Encounter (HOSPITAL_COMMUNITY): Payer: Self-pay | Admitting: Emergency Medicine

## 2018-01-18 ENCOUNTER — Emergency Department (HOSPITAL_COMMUNITY)
Admission: EM | Admit: 2018-01-18 | Discharge: 2018-01-18 | Disposition: A | Discharge status: Home / Self Care | Payer: Self-pay | Attending: Emergency Medicine | Admitting: Emergency Medicine

## 2018-01-18 ENCOUNTER — Other Ambulatory Visit: Payer: Self-pay

## 2018-01-18 DIAGNOSIS — Z79899 Other long term (current) drug therapy: Secondary | ICD-10-CM | POA: Insufficient documentation

## 2018-01-18 DIAGNOSIS — Z3A22 22 weeks gestation of pregnancy: Secondary | ICD-10-CM | POA: Insufficient documentation

## 2018-01-18 DIAGNOSIS — O99011 Anemia complicating pregnancy, first trimester: Secondary | ICD-10-CM

## 2018-01-18 DIAGNOSIS — O99019 Anemia complicating pregnancy, unspecified trimester: Secondary | ICD-10-CM | POA: Insufficient documentation

## 2018-01-18 DIAGNOSIS — O210 Mild hyperemesis gravidarum: Secondary | ICD-10-CM | POA: Insufficient documentation

## 2018-01-18 DIAGNOSIS — O219 Vomiting of pregnancy, unspecified: Secondary | ICD-10-CM

## 2018-01-18 LAB — URINALYSIS, ROUTINE W REFLEX MICROSCOPIC
BILIRUBIN URINE: NEGATIVE
GLUCOSE, UA: NEGATIVE mg/dL
Hgb urine dipstick: NEGATIVE
KETONES UR: 5 mg/dL — AB
Nitrite: NEGATIVE
PH: 5 (ref 5.0–8.0)
PROTEIN: NEGATIVE mg/dL
Specific Gravity, Urine: 1.023 (ref 1.005–1.030)

## 2018-01-18 LAB — BASIC METABOLIC PANEL
ANION GAP: 10 (ref 5–15)
BUN: 8 mg/dL (ref 6–20)
CHLORIDE: 104 mmol/L (ref 101–111)
CO2: 23 mmol/L (ref 22–32)
Calcium: 8.8 mg/dL — ABNORMAL LOW (ref 8.9–10.3)
Creatinine, Ser: 0.95 mg/dL (ref 0.44–1.00)
GFR calc Af Amer: >60 mL/min (ref >60)
GLUCOSE: 83 mg/dL (ref 65–99)
POTASSIUM: 3.6 mmol/L (ref 3.5–5.1)
SODIUM: 137 mmol/L (ref 135–145)

## 2018-01-18 LAB — CBC WITH DIFFERENTIAL/PLATELET
BASOS ABS: 0 10*3/uL (ref 0.0–0.1)
Basophils Relative: 1 %
Eosinophils Absolute: 0.1 10*3/uL (ref 0.0–0.7)
Eosinophils Relative: 1 %
HCT: 37.1 % (ref 36.0–46.0)
HEMOGLOBIN: 11.8 g/dL — AB (ref 12.0–15.0)
LYMPHS ABS: 1.8 10*3/uL (ref 0.7–4.0)
LYMPHS PCT: 40 %
MCH: 27 pg (ref 26.0–34.0)
MCHC: 31.8 g/dL (ref 30.0–36.0)
MCV: 84.9 fL (ref 78.0–100.0)
Monocytes Absolute: 0.3 10*3/uL (ref 0.1–1.0)
Monocytes Relative: 7 %
NEUTROS PCT: 51 %
Neutro Abs: 2.3 10*3/uL (ref 1.7–7.7)
Platelets: 230 10*3/uL (ref 150–400)
RBC: 4.37 MIL/uL (ref 3.87–5.11)
RDW: 13.4 % (ref 11.5–15.5)
WBC: 4.4 10*3/uL (ref 4.0–10.5)

## 2018-01-18 LAB — PREGNANCY, URINE: PREG TEST UR: POSITIVE — AB

## 2018-01-18 MED ORDER — PROMETHAZINE HCL 12.5 MG PO TABS: 12.5000 mg | ORAL_TABLET | Freq: Four times a day (QID) | ORAL | 0 refills | Status: DC | PRN

## 2018-01-18 MED ORDER — ONDANSETRON 4 MG PO TBDP
4.0000 mg | ORAL_TABLET | Freq: Once | ORAL | Status: AC
  Administered 2018-01-18: 4 mg via ORAL
  Filled 2018-01-18: qty 1

## 2018-01-18 MED ORDER — PRENATAL VITAMIN PLUS LOW IRON 27-1 MG PO TABS: 1.0000 | ORAL_TABLET | Freq: Every day | ORAL | 0 refills | Status: DC

## 2018-01-18 NOTE — ED Notes (Signed)
Sprite and crackers provided 

## 2018-01-18 NOTE — ED Triage Notes (Signed)
Pt c/o constant vomiting since 1am. Denies diarrhea. C/o burning to chest. Nad. Mm wet. C/o nausea. C/o dizziness with sitting from laying

## 2018-01-18 NOTE — ED Provider Notes (Signed)
[facility] EMERGENCY DEPARTMENT Provider Note   CSN: [id number] Arrival date & time: 01/18/18  1408     History   Chief Complaint Chief Complaint  Patient presents with  . Emesis    HPI Patricia [name] is a 27 y.o. female who presents to the ED with n/v that started today about 4 am. Patient reports vomiting more than 10 times since then. Patient denies diarrhea. [id number] LMP 11/29/17. Patient had IUD removed 11/28/17 and has been sexually active without birth control since then. Patient reports that her urine has been darker than normal but no dysuria, frequency or other problems.   HPI  Past Medical History:  Diagnosis Date  . Anemia   . Bacterial vaginosis   . Chlamydia   . Migraine   . Preterm labor   . Preterm labor without delivery in second trimester June 2012   Positive FFN in June  . UTI (lower urinary tract infection)     Patient Active Problem List   Diagnosis Date Noted  . Encounter for initial prescription of intrauterine contraceptive device 12/01/2015  . History of preterm delivery 11/08/2015  . 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 History of gestational diabetes 11/08/2015  . Chlamydia infection affecting pregnancy in third trimester, antepartum 08/20/2015  . 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 Obesity 04/01/2013    Past Surgical History:  Procedure Laterality Date  . NO PAST SURGERIES      OB History    Gravida Para Term Preterm AB Living   3 3 2 1   3    SAB TAB Ectopic Multiple Live Births         0 3       Home Medications    Prior to Admission medications   Medication Sig Start Date End Date Taking? Authorizing Provider  benzonatate (TESSALON) 100 MG capsule Take 2 capsules (200 mg total) by mouth 3 (three) times daily as needed. 02/27/17   Burgess AmorIdol, Julie, PA-C  bismuth subsalicylate (PEPTO BISMOL) 262 MG/15ML suspension Take 30 mLs by mouth every 6 (six) hours as needed for diarrhea or loose stools.    [provider]    loratadine-pseudoephedrine (CLARITIN-D 12 HOUR) 5-120 MG tablet Take 1 tablet by mouth 2 (two) times daily. 02/27/17   Burgess AmorIdol, Julie, PA-C  promethazine (PHENERGAN) 12.5 MG tablet Take 1 tablet (12.5 mg total) by mouth every 6 (six) hours as needed for nausea or vomiting. 01/18/18   Janne NapoleonNeese, Hope M, NP    Family History Family History  Problem Relation Age of Onset  . Diabetes Other        great granmother  . Diabetes Paternal Grandfather   . Hypertension Paternal Grandfather   . Cancer Paternal Grandfather        prostate  . Miscarriages / Stillbirths Maternal Grandmother     Social History Social History   Tobacco Use  . Smoking status: Never Smoker  . Smokeless tobacco: Never Used  Substance Use Topics  . Alcohol use: Yes    Comment: occasionally  . Drug use: No     Allergies   Patient has no known allergies.   [name] [name]  Constitutional: Negative for chills and fever.  HENT: Negative.   Eyes: Negative for visual disturbance.  Respiratory: Negative for cough and wheezing.   Cardiovascular: Chest pain: with hard vomiting.  Gastrointestinal: Positive for nausea and vomiting. Negative for abdominal pain.  Genitourinary: Negative for dysuria, frequency, urgency, vaginal bleeding and vaginal discharge.  Musculoskeletal:  Negative for back pain and myalgias.  Skin: Negative for rash.  Neurological: Negative for syncope and headaches.  Psychiatric/Behavioral: Negative for confusion.     Physical Exam Updated Vital Signs BP 125/64   Pulse 92   Temp 98.8 F (37.1 C) (Oral)   Resp 16   Ht 5\' 7"  (1.702 m)   Wt (!) 138.3 kg (305 lb)   LMP 11/30/2017   SpO2 99%   BMI 47.77 kg/m   Physical Exam  Constitutional: She appears well-developed and well-nourished. No distress.  HENT:  Head: Normocephalic.  Nose: Nose normal.  Mouth/Throat: Uvula is midline, oropharynx is clear and moist and mucous membranes are normal.  Eyes: Conjunctivae and EOM  are normal.  Neck: Normal range of motion. Neck supple.  Cardiovascular: Normal rate and regular rhythm.  Pulmonary/Chest: Effort normal and breath sounds normal.  Abdominal: Soft. Bowel sounds are normal. There is no tenderness.  Musculoskeletal: Normal range of motion.  Neurological: She is alert.  Skin: Skin is warm and dry.  Psychiatric: She has a normal mood and affect. Her behavior is normal.  Nursing note and vitals reviewed.    ED Treatments / Results  Labs (all labs ordered are listed, but only abnormal results are displayed) Labs Reviewed  PREGNANCY, URINE - Abnormal; Notable for the following components:      Result Value   Preg Test, Ur POSITIVE (*)    All other components within normal limits  CBC WITH DIFFERENTIAL/PLATELET - Abnormal; Notable for the following components:   Hemoglobin 11.8 (*)    All other components within normal limits  BASIC METABOLIC PANEL - Abnormal; Notable for the following components:   Calcium 8.8 (*)    All other components within normal limits  URINALYSIS, ROUTINE W REFLEX MICROSCOPIC - Abnormal; Notable for the following components:   APPearance HAZY (*)    Ketones, ur 5 (*)    Leukocytes, UA MODERATE (*)    Bacteria, UA FEW (*)    Squamous Epithelial / LPF 6-30 (*)    All other components within normal limits  URINE CULTURE   Radiology No results found.  Procedures Procedures (including critical care time)  Medications Ordered in ED Medications  ondansetron (ZOFRAN-ODT) disintegrating tablet 4 mg (4 mg Oral Given 01/18/18 1729)     Initial Impression / Assessment and Plan / ED Course  I have reviewed the triage vital signs and the nursing notes. 27 y.o. [id number] with positive pregnancy test and n/v stable for d/c with nausea resolved after treatment in the ED. Review of the labs shows that the patient is mildly anemic. Will start prenatal vitamins with Fe. And Rx for Phenergan for n/v. Urine sent for c/s due to findings but  patient w/o symptoms of UTI. Patient to schedule f/u with her OB. Stable for d/c without n/v at this time.   Final Clinical Impressions(s) / ED Diagnoses   Final diagnoses:  Nausea and vomiting in pregnancy prior to [redacted] weeks gestation  Anemia affecting pregnancy in first trimester    ED Discharge Orders        Ordered    promethazine (PHENERGAN) 12.5 MG tablet  Every 6 hours PRN     01/18/18 1755       Kerrie [place] Sum[place], NP 01/18/18 1759    Marily Memos, MD 01/18/18 [phone]

## 2018-01-18 NOTE — ED Notes (Signed)
Updated pt on wait time. Pt is understanding. NAD. No changes

## 2018-01-21 LAB — URINE CULTURE

## 2018-01-22 ENCOUNTER — Telehealth: Payer: Self-pay | Admitting: *Deleted

## 2018-01-22 NOTE — Progress Notes (Signed)
ED Antimicrobial Stewardship Positive Culture Follow Up   Patricia [name] is an 27 y.o. female who presented to [facility] on 01/18/2018 with a chief complaint of  Chief Complaint  Patient presents with  . Emesis    Recent Results (from the past 720 hour(s))  Urine culture     Status: Abnormal   Collection Time: 01/18/18  2:27 PM  Result Value Ref Range Status   Specimen Description   Final    URINE, CLEAN CATCH Performed at [facility], [address] [place], [place] [address]    Special Requests   Final    NONE Performed at [facility], [address] [place], [place] [address]    Culture >=100,000 COLONIES/mL ESCHERICHIA COLI (A)  Final   Report Status 01/21/2018 FINAL  Final   Organism ID, Bacteria ESCHERICHIA COLI (A)  Final      Susceptibility   Escherichia coli - MIC*    AMPICILLIN 8 SENSITIVE Sensitive     CEFAZOLIN <=4 SENSITIVE Sensitive     CEFTRIAXONE <=1 SENSITIVE Sensitive     CIPROFLOXACIN <=0.25 SENSITIVE Sensitive     GENTAMICIN <=1 SENSITIVE Sensitive     IMIPENEM <=0.25 SENSITIVE Sensitive     NITROFURANTOIN <=16 SENSITIVE Sensitive     TRIMETH/SULFA <=20 SENSITIVE Sensitive     AMPICILLIN/SULBACTAM 4 SENSITIVE Sensitive     PIP/TAZO <=4 SENSITIVE Sensitive     Extended ESBL NEGATIVE Sensitive     * >=100,000 COLONIES/mL ESCHERICHIA COLI   [x]  Patient discharged originally without antimicrobial agent and treatment is now indicated  New antibiotic prescription: cephalexin 500 mg bid x 1 week  ED Provider: [name], PA-C  Bertram MillardMichael A Bergen Melle 01/22/2018, 8:37 AM Infectious Diseases Pharmacist Phone# [phone]

## 2018-01-22 NOTE — Telephone Encounter (Signed)
Post ED Visit - Positive Culture Follow-up: Successful Patient Follow-Up  Culture assessed and recommendations reviewed by: []  [name], Pharm.D. []  [name], Pharm.D., BCPS AQ-ID [x]  [name], Pharm.D., BCPS []  [name], Pharm.D., BCPS []  [name], [address]Pharm.D., BCPS, AAHIVP []  [name], Pharm.D., BCPS, AAHIVP []  Lysle Pearlachel Rumbarger, PharmD, BCPS []  [name], PharmD, BCPS []  [name], PharmD, BCPS  Positive urine culture  [x]  Patient discharged without antimicrobial prescription and treatment is now indicated []  Organism is resistant to prescribed ED discharge antimicrobial []  Patient with positive blood cultures  Changes discussed with ED provider, [name], PA-C New antibiotic prescription Amoxicillian 500mg  PO BID x 7 days Called to [facility], [address], [place] [phone]  Contacted patient, date 01/22/2018, time 0930   [name], Patricia [name] 01/22/2018, 9:31 AM

## 2018-01-22 NOTE — Telephone Encounter (Signed)
LMOVM returning patient's call.  

## 2018-01-23 ENCOUNTER — Other Ambulatory Visit: Payer: Self-pay | Admitting: *Deleted

## 2018-01-23 NOTE — Telephone Encounter (Signed)
LMOVM returning patient's call and f/u on after hours nurse.

## 2018-01-25 ENCOUNTER — Encounter (HOSPITAL_COMMUNITY): Payer: Self-pay | Admitting: Emergency Medicine

## 2018-01-25 ENCOUNTER — Other Ambulatory Visit: Payer: Self-pay

## 2018-01-25 ENCOUNTER — Emergency Department (HOSPITAL_COMMUNITY)
Admission: EM | Admit: 2018-01-25 | Discharge: 2018-01-26 | Disposition: A | Discharge status: Home / Self Care | Payer: Self-pay | Attending: Emergency Medicine | Admitting: Emergency Medicine

## 2018-01-25 ENCOUNTER — Telehealth: Payer: Self-pay | Admitting: Adult Health

## 2018-01-25 ENCOUNTER — Emergency Department (HOSPITAL_COMMUNITY): Payer: Self-pay

## 2018-01-25 DIAGNOSIS — Z6791 Unspecified blood type, Rh negative: Secondary | ICD-10-CM

## 2018-01-25 DIAGNOSIS — Z3A00 Weeks of gestation of pregnancy not specified: Secondary | ICD-10-CM | POA: Insufficient documentation

## 2018-01-25 DIAGNOSIS — O200 Threatened abortion: Secondary | ICD-10-CM | POA: Insufficient documentation

## 2018-01-25 DIAGNOSIS — O26891 Other specified pregnancy related conditions, first trimester: Secondary | ICD-10-CM

## 2018-01-25 DIAGNOSIS — O209 Hemorrhage in early pregnancy, unspecified: Secondary | ICD-10-CM | POA: Insufficient documentation

## 2018-01-25 DIAGNOSIS — Z79899 Other long term (current) drug therapy: Secondary | ICD-10-CM | POA: Insufficient documentation

## 2018-01-25 DIAGNOSIS — O208 Other hemorrhage in early pregnancy: Secondary | ICD-10-CM

## 2018-01-25 DIAGNOSIS — O360190 Maternal care for anti-D [Rh] antibodies, unspecified trimester, not applicable or unspecified: Secondary | ICD-10-CM | POA: Insufficient documentation

## 2018-01-25 DIAGNOSIS — O4690 Antepartum hemorrhage, unspecified, unspecified trimester: Secondary | ICD-10-CM

## 2018-01-25 LAB — BASIC METABOLIC PANEL
Anion gap: 8 (ref 5–15)
BUN: 11 mg/dL (ref 6–20)
CALCIUM: 9 mg/dL (ref 8.9–10.3)
CHLORIDE: 104 mmol/L (ref 101–111)
CO2: 24 mmol/L (ref 22–32)
CREATININE: 0.9 mg/dL (ref 0.44–1.00)
Glucose, Bld: 91 mg/dL (ref 65–99)
Potassium: 3.5 mmol/L (ref 3.5–5.1)
SODIUM: 136 mmol/L (ref 135–145)

## 2018-01-25 LAB — HCG, QUANTITATIVE, PREGNANCY: HCG, BETA CHAIN, QUANT, S: 13724 m[IU]/mL — AB (ref <5)

## 2018-01-25 LAB — CBC
HCT: 36.1 % (ref 36.0–46.0)
HEMOGLOBIN: 11.5 g/dL — AB (ref 12.0–15.0)
MCH: 27.1 pg (ref 26.0–34.0)
MCHC: 31.9 g/dL (ref 30.0–36.0)
MCV: 85.1 fL (ref 78.0–100.0)
PLATELETS: 256 10*3/uL (ref 150–400)
RBC: 4.24 MIL/uL (ref 3.87–5.11)
RDW: 13.5 % (ref 11.5–15.5)
WBC: 5.8 10*3/uL (ref 4.0–10.5)

## 2018-01-25 LAB — I-STAT BETA HCG BLOOD, ED (MC, WL, AP ONLY)

## 2018-01-25 LAB — TYPE AND SCREEN
ABO/RH(D): A NEG
ANTIBODY SCREEN: NEGATIVE

## 2018-01-25 NOTE — ED Triage Notes (Signed)
Pt states that she is ~[redacted] weeks pregnant, bleeding on and off since Monday, no pain

## 2018-01-25 NOTE — ED Provider Notes (Signed)
[facility] EMERGENCY DEPARTMENT Provider Note   CSN: [id number] Arrival date & time: 01/25/18  1938     History   Chief Complaint Chief Complaint  Patient presents with  . Vaginal Bleeding    pregnant    HPI Patricia [name] is a 27 y.o. female.  HPI  27 yo g4 p3 a0 lmp 11/30/17 postive pregnancy test one week ago presents today with bleeding began Monday, no pain, no dizziness.  Called Dr. Forestine Chute office Tuesday and talked to after hours nurse recommended f/u 3-5 days but unable to and just got back in town today.  Monday spotting, Tuesday spotting, Wednesday no bleeding until yesterday with continued bleed through today about one pad per day. Denies pain, light headedness Denies abnormal vaginal discharge.   Past Medical History:  Diagnosis Date  . Anemia   . Bacterial vaginosis   . Chlamydia   . Migraine   . Preterm labor   . Preterm labor without delivery in second trimester June 2012   Positive FFN in June  . UTI (lower urinary tract infection)     Patient Active Problem List   Diagnosis Date Noted  . Encounter for initial prescription of intrauterine contraceptive device 12/01/2015  . History of preterm delivery 11/08/2015  . 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 History of gestational diabetes 11/08/2015  . Chlamydia infection affecting pregnancy in third trimester, antepartum 08/20/2015  . 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 Obesity 04/01/2013    Past Surgical History:  Procedure Laterality Date  . NO PAST SURGERIES      OB History    Gravida Para Term Preterm AB Living   4 3 2 1   3    SAB TAB Ectopic Multiple Live Births         0 3       Home Medications    Prior to Admission medications   Medication Sig Start Date End Date Taking? Authorizing Provider  benzonatate (TESSALON) 100 MG capsule Take 2 capsules (200 mg total) by mouth 3 (three) times daily as needed. 02/27/17  Yes Idol, Raynelle Fanning, PA-C  bismuth subsalicylate (PEPTO BISMOL) 262 MG/15ML  suspension Take 30 mLs by mouth every 6 (six) hours as needed for diarrhea or loose stools.   Yes [provider]  loratadine-pseudoephedrine (CLARITIN-D 12 HOUR) 5-120 MG tablet Take 1 tablet by mouth 2 (two) times daily. 02/27/17  Yes Idol, Raynelle Fanning, PA-C  Prenatal Vit-Fe Fumarate-FA (PRENATAL VITAMIN PLUS LOW IRON) 27-1 MG TABS Take 1 tablet by mouth daily. 01/18/18  Yes Neese, Theodora Blow, NP  promethazine (PHENERGAN) 12.5 MG tablet Take 1 tablet (12.5 mg total) by mouth every 6 (six) hours as needed for nausea or vomiting. 01/18/18  Yes Janne Napoleon, NP    Family History Family History  Problem Relation Age of Onset  . Diabetes Other        great granmother  . Diabetes Paternal Grandfather   . Hypertension Paternal Grandfather   . Cancer Paternal Grandfather        prostate  . Miscarriages / Stillbirths Maternal Grandmother     Social History Social History   Tobacco Use  . Smoking status: Never Smoker  . Smokeless tobacco: Never Used  Substance Use Topics  . Alcohol use: Yes    Comment: occasionally  . Drug use: No     Allergies   Patient has no known allergies.   [name] [name]  All other systems reviewed and are negative.  Physical Exam Updated Vital Signs BP 102/61 (BP Location: Right Wrist)   Pulse 86   Temp 99 F (37.2 C) (Oral)   Resp 16   Ht 1.702 m (5\' 7" )   Wt (!) 147.9 kg (326 lb)   LMP 12/01/2017   SpO2 98%   BMI 51.06 kg/m   Physical Exam  Constitutional: She appears well-developed and well-nourished.  HENT:  Head: Normocephalic and atraumatic.  Right Ear: External ear normal.  Left Ear: External ear normal.  Eyes: Pupils are equal, round, and reactive to light.  Neck: Normal range of motion. Neck supple.  Cardiovascular: Normal rate and regular rhythm.  Pulmonary/Chest: Effort normal and breath sounds normal.  Abdominal: Soft. Bowel sounds are normal.  Nursing note and vitals reviewed.    ED Treatments /  Results  Labs (all labs ordered are listed, but only abnormal results are displayed) Labs Reviewed  I-STAT BETA HCG BLOOD, ED ([facility], [facility], [facility] ONLY)    EKG  EKG Interpretation None       Radiology No results found.  Procedures Procedures (including critical care time)  Medications Ordered in ED Medications - No data to display   Initial Impression / Assessment and Plan / ED Course  I have reviewed the triage vital signs and the nursing notes.  Pertinent labs & imaging results that were available during my care of the patient were reviewed by me and considered in my medical decision making (see chart for details).     Discussed results of ultrasound and threatened miscarriage.  Patient is given RhoGam here in the ED.  We have discussed return precautions and need for close follow-up with her OB/GYN.  She voices understanding.  Final Clinical Impressions(s) / ED Diagnoses   Final diagnoses:  Vaginal bleeding in pregnancy  Threatened miscarriage  Rh negative status during pregnancy in first trimester    ED Discharge Orders    None       Margarita Grizzleay, Amadi Yoshino, MD 01/26/18 0006

## 2018-01-25 NOTE — ED Notes (Signed)
Ed Provider at bedside.  

## 2018-01-25 NOTE — Telephone Encounter (Signed)
Patient called stating that She is [redacted] weeks pregnant according to her last period, pt states that she was bleeding heavy and clot. Pt states that she had not symptoms of fever or pressure or pain, just bleeding. Pt states that it stopped and today it started up again. Please contact pt

## 2018-01-25 NOTE — Telephone Encounter (Signed)
Spoke with pt. Pt thinks she is about [redacted] weeks pregnant. She is having some spotting, not heavy. No cramps. No clots. Selena BattenKim, CNM advised it may be best to go to ER for eval. Pt is out of town but is coming back today and states will go to [facility] when she gets back in town. JSY

## 2018-01-26 MED ORDER — RHO D IMMUNE GLOBULIN 1500 UNIT/2ML IJ SOSY
300.0000 ug | PREFILLED_SYRINGE | Freq: Once | INTRAMUSCULAR | Status: AC
  Administered 2018-01-26: 300 ug via INTRAMUSCULAR

## 2018-01-26 NOTE — Discharge Instructions (Signed)
Please return if you have severe bleeding.  Call Dr. Despina HiddenEure for recheck on Monday.

## 2018-01-26 NOTE — ED Notes (Signed)
Pt understood dc material. NAD noted. 

## 2018-01-28 ENCOUNTER — Other Ambulatory Visit (HOSPITAL_COMMUNITY)
Admission: RE | Admit: 2018-01-28 | Discharge: 2018-01-28 | Disposition: A | Discharge status: Home / Self Care | Payer: Self-pay | Source: Ambulatory Visit | Attending: Obstetrics and Gynecology | Admitting: Obstetrics and Gynecology

## 2018-01-28 ENCOUNTER — Encounter: Payer: Self-pay | Admitting: Obstetrics and Gynecology

## 2018-01-28 ENCOUNTER — Ambulatory Visit (INDEPENDENT_AMBULATORY_CARE_PROVIDER_SITE_OTHER): Payer: Self-pay | Admitting: Obstetrics and Gynecology

## 2018-01-28 VITALS — BP 118/70 | HR 90 | Ht 67.0 in | Wt 342.0 lb

## 2018-01-28 DIAGNOSIS — O200 Threatened abortion: Secondary | ICD-10-CM | POA: Insufficient documentation

## 2018-01-28 LAB — RH IG WORKUP (INCLUDES ABO/RH)
ABO/RH(D): A NEG
Antibody Screen: NEGATIVE
Gestational Age(Wks): 7
UNIT DIVISION: 0

## 2018-01-28 LAB — GC/CHLAMYDIA PROBE AMP (~~LOC~~) NOT AT ARMC
CHLAMYDIA, DNA PROBE: POSITIVE — AB
NEISSERIA GONORRHEA: NEGATIVE

## 2018-01-28 LAB — HCG, QUANTITATIVE, PREGNANCY: hCG, Beta Chain, Quant, S: 8217 m[IU]/mL — ABNORMAL HIGH (ref <5)

## 2018-01-28 MED ORDER — MISOPROSTOL 200 MCG PO TABS: 800.0000 ug | ORAL_TABLET | Freq: Once | ORAL | 1 refills | Status: DC

## 2018-01-28 MED ORDER — NORGESTIMATE-ETH ESTRADIOL 0.25-35 MG-MCG PO TABS: 1.0000 | ORAL_TABLET | Freq: Every day | ORAL | 11 refills | Status: DC

## 2018-01-28 NOTE — Progress Notes (Addendum)
Patient ID: SYMPHANIE CEDERBERG, female   DOB: [birth year], 27 y.o.   MRN: [id number]   [facility] Clinic Visit  @DATE @            Patient name: Patricia [name] MRN [id number]  Date of birth: 1990-12-11  CC & HPI:  Patricia [name] is a 27 y.o. female presenting today for a follow-up of a recent ED visit on 01/25/2018 for vaginal bleeding s/p a positive pregnancy test. She reports that her bleeding started on Monday, 01/21/2018 with continued spotting each day. The patient adds bleeding as if she was on her menstrual cycle, but she did not see any blood clots. She states having to use one pad per day. At the ED she had an ultra sound that was "suspicious but not yet definitive for failed pregnancy". The patient live with her boyfriend and adds that his was not a planned pregnancy. She denies pain, dizziness, fever, chills or any other symptoms or complaints.   ROS:  ROS +vaginal bleeding/ spotting -pain -dizziness -fever -chills All systems are negative except as noted in the HPI and PMH.   Pertinent History Reviewed:   Reviewed:  Medical         Past Medical History:  Diagnosis Date  . Anemia   . Bacterial vaginosis   . Chlamydia   . Migraine   . Preterm labor   . Preterm labor without delivery in second trimester June 2012   Positive FFN in June  . UTI (lower urinary tract infection)                               Surgical Hx:    Past Surgical History:  Procedure Laterality Date  . NO PAST SURGERIES     Medications: Reviewed & Updated - see associated section                       Current Outpatient Medications:  .  benzonatate (TESSALON) 100 MG capsule, Take 2 capsules (200 mg total) by mouth 3 (three) times daily as needed. (Patient not taking: Reported on 01/28/2018), Disp: 30 capsule, Rfl: 0 .  bismuth subsalicylate (PEPTO BISMOL) 262 MG/15ML suspension, Take 30 mLs by mouth every 6 (six) hours as needed for diarrhea or loose stools., Disp: , Rfl:  .   loratadine-pseudoephedrine (CLARITIN-D 12 HOUR) 5-120 MG tablet, Take 1 tablet by mouth 2 (two) times daily. (Patient not taking: Reported on 01/28/2018), Disp: 20 tablet, Rfl: 0 .  Prenatal Vit-Fe Fumarate-FA (PRENATAL VITAMIN PLUS LOW IRON) 27-1 MG TABS, Take 1 tablet by mouth daily. (Patient not taking: Reported on 01/28/2018), Disp: 30 tablet, Rfl: 0 .  promethazine (PHENERGAN) 12.5 MG tablet, Take 1 tablet (12.5 mg total) by mouth every 6 (six) hours as needed for nausea or vomiting. (Patient not taking: Reported on 01/28/2018), Disp: 30 tablet, Rfl: 0   Social History: Reviewed -  reports that  has never smoked. she has never used smokeless tobacco.  Objective Findings:  Vitals: Blood pressure 118/70, pulse 90, height 5\' 7"  (1.702 m), weight (!) 342 lb (155.1 kg), last menstrual period 12/01/2017, currently breastfeeding.  PHYSICAL EXAMINATION General appearance - alert, well appearing, and in no distress, oriented to person, place, and time and overweight Mental status - alert, oriented to person, place, and time, normal mood, behavior, speech, dress, motor activity, and thought processes, affect appropriate to mood  PELVIC DEFERRED  Assessment & Plan:   A: Threatened Ab converted to  1. Inevitable AB 2. RH negative s/p Rhophylac.  P:  1. Repeat HCG quantitative lab work  HCG is declining  2. Will Rx Cytotec 800 mcg refil x 1.  3.  4. F/u 1 week for U/S =will cancel u/s  By signing my name below, I, Jennifer Gorman, attest that this docDiona Brownerumentation has been prepared under the direction and in the presence of Tilda BurrowFerguson, Metzli Pollick V, MD. Electronically Signed: Diona BrownerJennifer Gorman, Medical Scribe. 01/28/18. 1:38 PM.  I personally performed the services described in this documentation, which was SCRIBED in my presence. The recorded information has been reviewed and considered accurate. It has been edited as necessary during review. Tilda BurrowJohn V Hilman Kissling, MD

## 2018-01-29 ENCOUNTER — Telehealth: Payer: Self-pay | Admitting: Student

## 2018-01-29 ENCOUNTER — Other Ambulatory Visit: Payer: Self-pay | Admitting: Obstetrics and Gynecology

## 2018-01-29 DIAGNOSIS — O3680X0 Pregnancy with inconclusive fetal viability, not applicable or unspecified: Secondary | ICD-10-CM

## 2018-01-29 DIAGNOSIS — A749 Chlamydial infection, unspecified: Secondary | ICD-10-CM

## 2018-01-29 MED ORDER — AZITHROMYCIN 500 MG PO TABS: 1000.0000 mg | ORAL_TABLET | Freq: Once | ORAL | 0 refills | Status: AC

## 2018-01-29 NOTE — Telephone Encounter (Addendum)
Patricia [name] tested positive for  Chlamydia. Patient was called by RN and allergies and pharmacy confirmed. Rx sent to pharmacy of choice.   Judeth HornLawrence, Davaris Youtsey, NP 01/29/2018 3:26 PM       ----- Message from Kathe BectonLori S Berdik, RN sent at 01/29/2018 12:34 PM EST ----- This patient tested positive for:  Chlamydia  She "has NKDA" I have informed the patient of her results and confirmed her pharmacy is correct in her chart. Please send Rx.   Thank you,   Kathe BectonBerdik, Lori S, RN   [facility]

## 2018-02-05 ENCOUNTER — Other Ambulatory Visit: Payer: Self-pay

## 2018-02-22 ENCOUNTER — Other Ambulatory Visit: Payer: Self-pay

## 2018-06-10 ENCOUNTER — Encounter (HOSPITAL_COMMUNITY): Payer: Self-pay | Admitting: *Deleted

## 2018-06-10 ENCOUNTER — Emergency Department (HOSPITAL_COMMUNITY): Payer: Self-pay

## 2018-06-10 ENCOUNTER — Other Ambulatory Visit: Payer: Self-pay

## 2018-06-10 ENCOUNTER — Emergency Department (HOSPITAL_COMMUNITY)
Admission: EM | Admit: 2018-06-10 | Discharge: 2018-06-10 | Disposition: A | Discharge status: Home / Self Care | Payer: Self-pay | Attending: Emergency Medicine | Admitting: Emergency Medicine

## 2018-06-10 DIAGNOSIS — R079 Chest pain, unspecified: Secondary | ICD-10-CM | POA: Insufficient documentation

## 2018-06-10 DIAGNOSIS — Z79899 Other long term (current) drug therapy: Secondary | ICD-10-CM | POA: Insufficient documentation

## 2018-06-10 DIAGNOSIS — R7989 Other specified abnormal findings of blood chemistry: Secondary | ICD-10-CM | POA: Insufficient documentation

## 2018-06-10 LAB — CBC WITH DIFFERENTIAL/PLATELET
BASOS PCT: 1 %
Basophils Absolute: 0 10*3/uL (ref 0.0–0.1)
EOS ABS: 0.1 10*3/uL (ref 0.0–0.7)
EOS PCT: 2 %
HCT: 37.8 % (ref 36.0–46.0)
HEMOGLOBIN: 11.8 g/dL — AB (ref 12.0–15.0)
Lymphocytes Relative: 36 %
Lymphs Abs: 1.8 10*3/uL (ref 0.7–4.0)
MCH: 26.8 pg (ref 26.0–34.0)
MCHC: 31.2 g/dL (ref 30.0–36.0)
MCV: 85.7 fL (ref 78.0–100.0)
Monocytes Absolute: 0.4 10*3/uL (ref 0.1–1.0)
Monocytes Relative: 7 %
NEUTROS PCT: 54 %
Neutro Abs: 2.6 10*3/uL (ref 1.7–7.7)
PLATELETS: 221 10*3/uL (ref 150–400)
RBC: 4.41 MIL/uL (ref 3.87–5.11)
RDW: 13.5 % (ref 11.5–15.5)
WBC: 4.9 10*3/uL (ref 4.0–10.5)

## 2018-06-10 LAB — BASIC METABOLIC PANEL
ANION GAP: 6 (ref 5–15)
BUN: 11 mg/dL (ref 6–20)
CALCIUM: 8.8 mg/dL — AB (ref 8.9–10.3)
CO2: 27 mmol/L (ref 22–32)
Chloride: 105 mmol/L (ref 98–111)
Creatinine, Ser: 1.09 mg/dL — ABNORMAL HIGH (ref 0.44–1.00)
GFR calc Af Amer: >60 mL/min (ref >60)
GLUCOSE: 98 mg/dL (ref 70–99)
Potassium: 4.1 mmol/L (ref 3.5–5.1)
Sodium: 138 mmol/L (ref 135–145)

## 2018-06-10 LAB — D-DIMER, QUANTITATIVE (NOT AT ARMC): D DIMER QUANT: 0.86 ug{FEU}/mL — AB (ref 0.00–0.50)

## 2018-06-10 LAB — I-STAT TROPONIN, ED: Troponin i, poc: 0 ng/mL (ref 0.00–0.08)

## 2018-06-10 LAB — I-STAT BETA HCG BLOOD, ED (MC, WL, AP ONLY): I-stat hCG, quantitative: <5 m[IU]/mL (ref <5)

## 2018-06-10 MED ORDER — IBUPROFEN 800 MG PO TABS
800.0000 mg | ORAL_TABLET | Freq: Once | ORAL | Status: AC
  Administered 2018-06-10: 800 mg via ORAL
  Filled 2018-06-10: qty 1

## 2018-06-10 MED ORDER — IOPAMIDOL (ISOVUE-370) INJECTION 76%
100.0000 mL | Freq: Once | INTRAVENOUS | Status: AC | PRN
  Administered 2018-06-10: 100 mL via INTRAVENOUS

## 2018-06-10 NOTE — ED Notes (Signed)
Patient transported to X-ray 

## 2018-06-10 NOTE — ED Triage Notes (Signed)
Pt c/o left side chest pain that radiates around to her back; pt describes the pain as sharp and states she was sleeping when the episode started

## 2018-06-10 NOTE — ED Provider Notes (Signed)
[facility] EMERGENCY DEPARTMENT Provider Note   CSN: [id number] Arrival date & time: 06/10/18  0556     History   Chief Complaint Chief Complaint  Patient presents with  . Chest Pain    HPI Patricia [name] is a 27 y.o. female.  The history is provided by the patient.  She has history of gestational diabetes and comes in complaining of sharp left-sided chest pain with some radiation to the back.  Pain is rated at 8/10.  It woke her up from sleep.  Is worse with certain movements-especially twisting.  Pain is not affected by breathing or exertion.  There is mild dyspnea, but no nausea, vomiting, diaphoresis.  She has not had similar pain previously.  She does work at [employer] where she has to do a lot of lifting, but does not recall any unusually heavy lifting recently.  She does take birth control pills.  She denies recent travel or surgery.  She did have a miscarriage 4 months ago.  She is a non-smoker and denies history of diabetes or hypertension or hyperlipidemia.  There is no family history of premature coronary atherosclerosis.  Past Medical History:  Diagnosis Date  . Anemia   . Bacterial vaginosis   . Chlamydia   . Migraine   . Preterm labor   . Preterm labor without delivery in second trimester June 2012   Positive FFN in June  . UTI (lower urinary tract infection)     Patient Active Problem List   Diagnosis Date Noted  . Encounter for initial prescription of intrauterine contraceptive device 12/01/2015  . History of preterm delivery 11/08/2015  . 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 History of gestational diabetes 11/08/2015  . Chlamydia infection affecting pregnancy in third trimester, antepartum 08/20/2015  . 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 Obesity 04/01/2013    Past Surgical History:  Procedure Laterality Date  . NO PAST SURGERIES       OB History    Gravida  4   Para  3   Term  2   Preterm  1   AB      Living  3     SAB      TAB      Ectopic      Multiple  0   Live Births  3            Home Medications    Prior to Admission medications   Medication Sig Start Date End Date Taking? Authorizing Provider  benzonatate (TESSALON) 100 MG capsule Take 2 capsules (200 mg total) by mouth 3 (three) times daily as needed. Patient not taking: Reported on 01/28/2018 02/27/17   Burgess Amor, PA-C  bismuth subsalicylate (PEPTO BISMOL) 262 MG/15ML suspension Take 30 mLs by mouth every 6 (six) hours as needed for diarrhea or loose stools.    [provider]  loratadine-pseudoephedrine (CLARITIN-D 12 HOUR) 5-120 MG tablet Take 1 tablet by mouth 2 (two) times daily. Patient not taking: Reported on 01/28/2018 02/27/17   Burgess Amor, PA-C  misoprostol (CYTOTEC) 200 MCG tablet Place 4 tablets (800 mcg total) vaginally once for 1 dose. 01/28/18 01/28/18  Tilda Burrow, MD  norgestimate-ethinyl estradiol (ORTHO-CYCLEN,SPRINTEC,PREVIFEM) 0.25-35 MG-MCG tablet Take 1 tablet by mouth daily. 01/28/18   Tilda Burrow, MD  Prenatal Vit-Fe Fumarate-FA (PRENATAL VITAMIN PLUS LOW IRON) 27-1 MG TABS Take 1 tablet by mouth daily. Patient not taking: Reported on 01/28/2018 01/18/18   Janne Napoleon, NP  promethazine (PHENERGAN) 12.5 MG  tablet Take 1 tablet (12.5 mg total) by mouth every 6 (six) hours as needed for nausea or vomiting. Patient not taking: Reported on 01/28/2018 01/18/18   Janne NapoleonNeese, Hope M, NP    Family History Family History  Problem Relation Age of Onset  . Diabetes Other        great granmother  . Diabetes Paternal Grandfather   . Hypertension Paternal Grandfather   . Cancer Paternal Grandfather        prostate  . Miscarriages / Stillbirths Maternal Grandmother     Social History Social History   Tobacco Use  . Smoking status: Never Smoker  . Smokeless tobacco: Never Used  Substance Use Topics  . Alcohol use: Yes    Comment: occasionally  . Drug use: No     Allergies   Patient has no known allergies.   Review of  Systems [name]  All other systems reviewed and are negative.    Physical Exam Updated Vital Signs BP 133/85 (BP Location: Left Wrist)   Pulse 83   Temp 98.9 F (37.2 C) (Oral)   Resp 16   Ht 5\' 7"  (1.702 m)   Wt 121.6 kg (268 lb)   LMP 05/18/2018   SpO2 98%   Breastfeeding? Unknown   BMI 41.97 kg/m   Physical Exam  Nursing note and vitals reviewed.  Obese 27 year old female, resting comfortably and in no acute distress. Vital signs are normal. Oxygen saturation is 98%, which is normal. Head is normocephalic and atraumatic. PERRLA, EOMI. Oropharynx is clear. Neck is nontender and supple without adenopathy or JVD. Back is nontender and there is no CVA tenderness. Lungs are clear without rales, wheezes, or rhonchi. Chest is tender in the left parasternal area.  This does reproduce her pain.Marland Kitchen. Heart has regular rate and rhythm without murmur. Abdomen is soft, flat, nontender without masses or hepatosplenomegaly and peristalsis is normoactive. Extremities have 1+ edema, full range of motion is present. Skin is warm and dry without rash. Neurologic: Mental status is normal, cranial nerves are intact, there are no motor or sensory deficits.  ED Treatments / Results  Labs (all labs ordered are listed, but only abnormal results are displayed) Labs Reviewed  BASIC METABOLIC PANEL - Abnormal; Notable for the following components:      Result Value   Creatinine, Ser 1.09 (*)    Calcium 8.8 (*)    All other components within normal limits  D-DIMER, QUANTITATIVE (NOT AT [facility]) - Abnormal; Notable for the following components:   D-Dimer, Quant 0.86 (*)    All other components within normal limits  CBC WITH DIFFERENTIAL/PLATELET - Abnormal; Notable for the following components:   Hemoglobin 11.8 (*)    All other components within normal limits  I-STAT TROPONIN, ED  I-STAT BETA HCG BLOOD, ED ([facility], [facility], [facility] ONLY)    EKG EKG Interpretation  Date/Time:  Monday June 10 2018  06:09:46 EDT Ventricular Rate:  86 PR Interval:    QRS Duration: 87 QT Interval:  365 QTC Calculation: 437 R Axis:   33 Text Interpretation:  Sinus rhythm Low voltage, precordial leads Otherwise within normal limits When compared with ECG of 09/03/2014, HEART RATE has decreased Confirmed by Dione BoozeGlick, Margree Gimbel ([id number]) on 06/10/2018 6:14:54 AM   Radiology No results found.  Procedures Procedures   Medications Ordered in ED Medications  ibuprofen (ADVIL,MOTRIN) tablet 800 mg (800 mg Oral Given 06/10/18 [id number])     Initial Impression / Assessment and Plan / ED Course  I have reviewed the triage vital signs and the nursing notes.  Pertinent labs & imaging results that were available during my care of the patient were reviewed by me and considered in my medical decision making (see chart for details).  Chest pain which is most likely chest wall pain.  Heart score is 0 which puts her at very low risk for major adverse cardiac events in the next 6 weeks.  Unable to clear her for pulmonary embolism by [facility] and [facility] criteria because of use of exogenous estrogens.  Will check d-dimer.  She is given dose of ibuprofen.  Old records are reviewed confirming miscarriage 4 months ago but no prior visits for chest pain.  D-dimer has come back positive, she will need to be sent for CT angiogram of the chest.  Remainder of labs are unremarkable.  Case is signed out to Dr. Danelle Earthly to evaluate CT angiogram.  Final Clinical Impressions(s) / ED Diagnoses   Final diagnoses:  Nonspecific chest pain  Elevated d-dimer    ED Discharge Orders    None       Dione Booze, MD 06/10/18 [phone]

## 2018-06-10 NOTE — ED Notes (Signed)
I-Stat TPN: 0.00 I-Stat HCG: <5

## 2018-11-27 NOTE — L&D Delivery Note (Addendum)
Patient: Patricia [name] MRN: [id number]  GBS status: Positive, IAP given  Patient is a 28 y.o. now [id number] s/p NSVD at [redacted]w[redacted]d, who was admitted for SOL. SROM 1h 52m prior to delivery with clear fluid.    Delivery Note At 4:09 PM a viable female was delivered via Vaginal, Spontaneous (Presentation:Vertex ;LOA  ).  APGAR:8 ,9 ; weight : Pending .   Placenta status: Delivered intact but cord avulsed while placenta was in the vagina.  There was a manual extraction from the vagina. Cord: Three-vessel cord with the following complications: Cord avulsion, true knot  Anesthesia: Epidural Episiotomy: None Lacerations: Labial Suture Repair: NA Est. Blood Loss (mL): 300  Mom to postpartum.  Baby to Couplet care / Skin to Skin.  Gifford Shave 09/15/2019, 4:33 PM  Head delivered LOA. No nuchal cord present. Shoulder and body delivered in usual fashion. Infant with spontaneous cry, placed on mother's abdomen, dried and bulb suctioned. Cord clamped x 2 after 1-minute delay, and cut by father of the baby. Cord blood drawn.  Gentle cord traction was applied then cord avulsed while placenta was still in patient's vagina.  It was manually extracted from the patient's vagina.  The cord was found to have a true knot in it as well.  Fundus firm with massage and Pitocin. Perineum inspected and found to have bilateral labial laceration, which was found to be hemostatic and did not require repair.  Midwife attestation: I was gloved and present for delivery in its entirety and I agree with the above resident's note.  Julianne Handler, CNM 4:50 PM

## 2019-01-15 ENCOUNTER — Ambulatory Visit: Payer: Medicaid Other | Admitting: Adult Health

## 2019-01-15 ENCOUNTER — Encounter: Payer: Self-pay | Admitting: Adult Health

## 2019-01-15 VITALS — BP 145/87 | HR 101 | Ht 67.0 in | Wt 357.0 lb

## 2019-01-15 DIAGNOSIS — Z3A01 Less than 8 weeks gestation of pregnancy: Secondary | ICD-10-CM | POA: Diagnosis not present

## 2019-01-15 DIAGNOSIS — O09291 Supervision of pregnancy with other poor reproductive or obstetric history, first trimester: Secondary | ICD-10-CM | POA: Diagnosis not present

## 2019-01-15 DIAGNOSIS — Z3201 Encounter for pregnancy test, result positive: Secondary | ICD-10-CM | POA: Insufficient documentation

## 2019-01-15 DIAGNOSIS — N926 Irregular menstruation, unspecified: Secondary | ICD-10-CM

## 2019-01-15 DIAGNOSIS — O09211 Supervision of pregnancy with history of pre-term labor, first trimester: Secondary | ICD-10-CM

## 2019-01-15 DIAGNOSIS — O3680X0 Pregnancy with inconclusive fetal viability, not applicable or unspecified: Secondary | ICD-10-CM

## 2019-01-15 DIAGNOSIS — O09891 Supervision of other high risk pregnancies, first trimester: Secondary | ICD-10-CM | POA: Insufficient documentation

## 2019-01-15 LAB — POCT URINE PREGNANCY: Preg Test, Ur: POSITIVE — AB

## 2019-01-15 NOTE — Progress Notes (Signed)
Patient ID: Patricia [name], female   DOB: 31-Dec-1990, 28 y.o.   MRN: [id number] History of Present Illness:  Patricia [name] is a 28 year old black female, single in for UPT, has missed a period. She works at [employer] She had miscarriage last year.She has history of preterm delivery.  PCP is [facility].  Current Medications, Allergies, Past Medical History, Past Surgical History, Family History and Social History were reviewed in [employer] record.     [name]:  +missed period +breast tenderness    Physical Exam:BP (!) 145/87 (BP Location: Left Arm, Patient Position: Sitting, Cuff Size: Normal)   Pulse (!) 101   Ht 5\' 7"  (1.702 m)   Wt (!) 357 lb (161.9 kg)   LMP 12/08/2018 (Exact Date)   BMI 55.91 kg/m   UPT +, about 5+3 weeks by LMP with EDD 09/14/2019. General:  Well developed, well nourished, no acute distress Skin:  Warm and dry Neck:  Midline trachea, normal thyroid, good ROM, no lymphadenopathy Lungs; Clear to auscultation bilaterally Cardiovascular: Regular rate and rhythm Abdomen:  Soft, non tender Psych:  No mood changes, alert and cooperative,seems happy Fall risk is low. PHQ 2 score 0.  Impression: 1. Pregnancy test positive   2. Less than [redacted] weeks gestation of pregnancy   3. Encounter to determine fetal viability of pregnancy, single or unspecified fetus   4. History of miscarriage, currently pregnant, first trimester   5. History of preterm delivery, currently pregnant in first trimester       Plan: Continue PNV Check QHCG and progesterone today Return in 2 weeks for dating US/4 weeks new OB Review handouts on First trimester and by [facility]

## 2019-01-15 NOTE — Patient Instructions (Signed)
First Trimester of Pregnancy  The first trimester of pregnancy is from week 1 until the end of week 13 (months 1 through 3). A week after a sperm fertilizes an egg, the egg will implant on the wall of the uterus. This embryo will begin to develop into a baby. Genes from you and your partner will form the baby. The female genes will determine whether the baby will be a boy or a girl. At 6-8 weeks, the eyes and face will be formed, and the heartbeat can be seen on ultrasound. At the end of 12 weeks, all the baby's organs will be formed.  Now that you are pregnant, you will want to do everything you can to have a healthy baby. Two of the most important things are to get good prenatal care and to follow your health care provider's instructions. Prenatal care is all the medical care you receive before the baby's birth. This care will help prevent, find, and treat any problems during the pregnancy and childbirth.  Body changes during your first trimester  Your body goes through many changes during pregnancy. The changes vary from woman to woman.   You may gain or lose a couple of pounds at first.   You may feel sick to your stomach (nauseous) and you may throw up (vomit). If the vomiting is uncontrollable, call your health care provider.   You may tire easily.   You may develop headaches that can be relieved by medicines. All medicines should be approved by your health care provider.   You may urinate more often. Painful urination may mean you have a bladder infection.   You may develop heartburn as a result of your pregnancy.   You may develop constipation because certain hormones are causing the muscles that push stool through your intestines to slow down.   You may develop hemorrhoids or swollen veins (varicose veins).   Your breasts may begin to grow larger and become tender. Your nipples may stick out more, and the tissue that surrounds them (areola) may become darker.   Your gums may bleed and may be  sensitive to brushing and flossing.   Dark spots or blotches (chloasma, mask of pregnancy) may develop on your face. This will likely fade after the baby is born.   Your menstrual periods will stop.   You may have a loss of appetite.   You may develop cravings for certain kinds of food.   You may have changes in your emotions from day to day, such as being excited to be pregnant or being concerned that something may go wrong with the pregnancy and baby.   You may have more vivid and strange dreams.   You may have changes in your hair. These can include thickening of your hair, rapid growth, and changes in texture. Some women also have hair loss during or after pregnancy, or hair that feels dry or thin. Your hair will most likely return to normal after your baby is born.  What to expect at prenatal visits  During a routine prenatal visit:   You will be weighed to make sure you and the baby are growing normally.   Your blood pressure will be taken.   Your abdomen will be measured to track your baby's growth.   The fetal heartbeat will be listened to between weeks 10 and 14 of your pregnancy.   Test results from any previous visits will be discussed.  Your health care provider may ask you:     How you are feeling.   If you are feeling the baby move.   If you have had any abnormal symptoms, such as leaking fluid, bleeding, severe headaches, or abdominal cramping.   If you are using any tobacco products, including cigarettes, chewing tobacco, and electronic cigarettes.   If you have any questions.  Other tests that may be performed during your first trimester include:   Blood tests to find your blood type and to check for the presence of any previous infections. The tests will also be used to check for low iron levels (anemia) and protein on red blood cells (Rh antibodies). Depending on your risk factors, or if you previously had diabetes during pregnancy, you may have tests to check for high blood sugar  that affects pregnant women (gestational diabetes).   Urine tests to check for infections, diabetes, or protein in the urine.   An ultrasound to confirm the proper growth and development of the baby.   Fetal screens for spinal cord problems (spina bifida) and Down syndrome.   HIV (human immunodeficiency virus) testing. Routine prenatal testing includes screening for HIV, unless you choose not to have this test.   You may need other tests to make sure you and the baby are doing well.  Follow these instructions at home:  Medicines   Follow your health care provider's instructions regarding medicine use. Specific medicines may be either safe or unsafe to take during pregnancy.   Take a prenatal vitamin that contains at least 600 micrograms (mcg) of folic acid.   If you develop constipation, try taking a stool softener if your health care provider approves.  Eating and drinking     Eat a balanced diet that includes fresh fruits and vegetables, whole grains, good sources of protein such as meat, eggs, or tofu, and low-fat dairy. Your health care provider will help you determine the amount of weight gain that is right for you.   Avoid raw meat and uncooked cheese. These carry germs that can cause birth defects in the baby.   Eating four or five small meals rather than three large meals a day may help relieve nausea and vomiting. If you start to feel nauseous, eating a few soda crackers can be helpful. Drinking liquids between meals, instead of during meals, also seems to help ease nausea and vomiting.   Limit foods that are high in fat and processed sugars, such as fried and sweet foods.   To prevent constipation:  ? Eat foods that are high in fiber, such as fresh fruits and vegetables, whole grains, and beans.  ? Drink enough fluid to keep your urine clear or pale yellow.  Activity   Exercise only as directed by your health care provider. Most women can continue their usual exercise routine during  pregnancy. Try to exercise for 30 minutes at least 5 days a week. Exercising will help you:  ? Control your weight.  ? Stay in shape.  ? Be prepared for labor and delivery.   Experiencing pain or cramping in the lower abdomen or lower back is a good sign that you should stop exercising. Check with your health care provider before continuing with normal exercises.   Try to avoid standing for long periods of time. Move your legs often if you must stand in one place for a long time.   Avoid heavy lifting.   Wear low-heeled shoes and practice good posture.   You may continue to have sex unless your health care   provider tells you not to.  Relieving pain and discomfort   Wear a good support bra to relieve breast tenderness.   Take warm sitz baths to soothe any pain or discomfort caused by hemorrhoids. Use hemorrhoid cream if your health care provider approves.   Rest with your legs elevated if you have leg cramps or low back pain.   If you develop varicose veins in your legs, wear support hose. Elevate your feet for 15 minutes, 3-4 times a day. Limit salt in your diet.  Prenatal care   Schedule your prenatal visits by the twelfth week of pregnancy. They are usually scheduled monthly at first, then more often in the last 2 months before delivery.   Write down your questions. Take them to your prenatal visits.   Keep all your prenatal visits as told by your health care provider. This is important.  Safety   Wear your seat belt at all times when driving.   Make a list of emergency phone numbers, including numbers for family, friends, the hospital, and police and fire departments.  General instructions   Ask your health care provider for a referral to a local prenatal education class. Begin classes no later than the beginning of month 6 of your pregnancy.   Ask for help if you have counseling or nutritional needs during pregnancy. Your health care provider can offer advice or refer you to specialists for help  with various needs.   Do not use hot tubs, steam rooms, or saunas.   Do not douche or use tampons or scented sanitary pads.   Do not cross your legs for long periods of time.   Avoid cat litter boxes and soil used by cats. These carry germs that can cause birth defects in the baby and possibly loss of the fetus by miscarriage or stillbirth.   Avoid all smoking, herbs, alcohol, and medicines not prescribed by your health care provider. Chemicals in these products affect the formation and growth of the baby.   Do not use any products that contain nicotine or tobacco, such as cigarettes and e-cigarettes. If you need help quitting, ask your health care provider. You may receive counseling support and other resources to help you quit.   Schedule a dentist appointment. At home, brush your teeth with a soft toothbrush and be gentle when you floss.  Contact a health care provider if:   You have dizziness.   You have mild pelvic cramps, pelvic pressure, or nagging pain in the abdominal area.   You have persistent nausea, vomiting, or diarrhea.   You have a bad smelling vaginal discharge.   You have pain when you urinate.   You notice increased swelling in your face, hands, legs, or ankles.   You are exposed to fifth disease or chickenpox.   You are exposed to German measles (rubella) and have never had it.  Get help right away if:   You have a fever.   You are leaking fluid from your vagina.   You have spotting or bleeding from your vagina.   You have severe abdominal cramping or pain.   You have rapid weight gain or loss.   You vomit blood or material that looks like coffee grounds.   You develop a severe headache.   You have shortness of breath.   You have any kind of trauma, such as from a fall or a car accident.  Summary   The first trimester of pregnancy is from week 1 until   the end of week 13 (months 1 through 3).   Your body goes through many changes during pregnancy. The changes vary from  woman to woman.   You will have routine prenatal visits. During those visits, your health care provider will examine you, discuss any test results you may have, and talk with you about how you are feeling.  This information is not intended to replace advice given to you by your health care provider. Make sure you discuss any questions you have with your health care provider.  Document Released: 11/07/2001 Document Revised: 10/25/2016 Document Reviewed: 10/25/2016  Elsevier Interactive Patient Education  2019 [employer]

## 2019-01-16 ENCOUNTER — Telehealth: Payer: Self-pay | Admitting: Adult Health

## 2019-01-16 LAB — PROGESTERONE: PROGESTERONE: 20.6 ng/mL

## 2019-01-16 LAB — BETA HCG QUANT (REF LAB): hCG Quant: 10637 m[IU]/mL

## 2019-01-16 NOTE — Telephone Encounter (Signed)
Left message that progesterone level is excellent and QHCG put you between 6-7 weeks

## 2019-01-16 NOTE — Telephone Encounter (Signed)
Pt aware of labs  

## 2019-01-29 ENCOUNTER — Ambulatory Visit (INDEPENDENT_AMBULATORY_CARE_PROVIDER_SITE_OTHER): Payer: BC Managed Care – PPO

## 2019-01-29 DIAGNOSIS — Z3A01 Less than 8 weeks gestation of pregnancy: Secondary | ICD-10-CM | POA: Diagnosis not present

## 2019-01-29 DIAGNOSIS — O3680X0 Pregnancy with inconclusive fetal viability, not applicable or unspecified: Secondary | ICD-10-CM

## 2019-01-29 NOTE — Progress Notes (Signed)
[place] 7+3 wks,single IUP,w/ys,positive FHT 154 BPM,normal ovaries bilat,crl 12.8 mm

## 2019-02-12 ENCOUNTER — Other Ambulatory Visit (HOSPITAL_COMMUNITY)
Admission: RE | Admit: 2019-02-12 | Discharge: 2019-02-12 | Disposition: A | Discharge status: Home / Self Care | Payer: Medicaid Other | Source: Ambulatory Visit | Attending: Obstetrics & Gynecology | Admitting: Obstetrics & Gynecology

## 2019-02-12 ENCOUNTER — Encounter: Payer: Self-pay | Admitting: Women's Health

## 2019-02-12 ENCOUNTER — Ambulatory Visit (INDEPENDENT_AMBULATORY_CARE_PROVIDER_SITE_OTHER): Payer: BC Managed Care – PPO | Admitting: Women's Health

## 2019-02-12 ENCOUNTER — Ambulatory Visit: Payer: Medicaid Other | Admitting: *Deleted

## 2019-02-12 ENCOUNTER — Other Ambulatory Visit: Payer: Self-pay

## 2019-02-12 VITALS — BP 135/74 | HR 87 | Wt 350.0 lb

## 2019-02-12 DIAGNOSIS — N898 Other specified noninflammatory disorders of vagina: Secondary | ICD-10-CM | POA: Diagnosis not present

## 2019-02-12 DIAGNOSIS — Z8751 Personal history of pre-term labor: Secondary | ICD-10-CM

## 2019-02-12 DIAGNOSIS — Z3682 Encounter for antenatal screening for nuchal translucency: Secondary | ICD-10-CM

## 2019-02-12 DIAGNOSIS — Z8632 Personal history of gestational diabetes: Secondary | ICD-10-CM

## 2019-02-12 DIAGNOSIS — Z124 Encounter for screening for malignant neoplasm of cervix: Secondary | ICD-10-CM | POA: Insufficient documentation

## 2019-02-12 DIAGNOSIS — R768 Other specified abnormal immunological findings in serum: Secondary | ICD-10-CM

## 2019-02-12 DIAGNOSIS — O26891 Other specified pregnancy related conditions, first trimester: Secondary | ICD-10-CM

## 2019-02-12 DIAGNOSIS — Z3A09 9 weeks gestation of pregnancy: Secondary | ICD-10-CM | POA: Insufficient documentation

## 2019-02-12 DIAGNOSIS — Z1371 Encounter for nonprocreative screening for genetic disease carrier status: Secondary | ICD-10-CM

## 2019-02-12 DIAGNOSIS — Z331 Pregnant state, incidental: Secondary | ICD-10-CM

## 2019-02-12 DIAGNOSIS — Z23 Encounter for immunization: Secondary | ICD-10-CM

## 2019-02-12 DIAGNOSIS — Z3481 Encounter for supervision of other normal pregnancy, first trimester: Secondary | ICD-10-CM | POA: Insufficient documentation

## 2019-02-12 DIAGNOSIS — Z3490 Encounter for supervision of normal pregnancy, unspecified, unspecified trimester: Secondary | ICD-10-CM | POA: Insufficient documentation

## 2019-02-12 DIAGNOSIS — Z1389 Encounter for screening for other disorder: Secondary | ICD-10-CM

## 2019-02-12 LAB — POCT WET PREP (WET MOUNT)
Clue Cells Wet Prep Whiff POC: POSITIVE
Trichomonas Wet Prep HPF POC: ABSENT

## 2019-02-12 LAB — POCT URINALYSIS DIPSTICK OB
Blood, UA: NEGATIVE
Glucose, UA: NEGATIVE
Ketones, UA: NEGATIVE
Leukocytes, UA: NEGATIVE
Nitrite, UA: NEGATIVE
POC,PROTEIN,UA: NEGATIVE

## 2019-02-12 MED ORDER — CITRANATAL ASSURE 35-1 & 300 MG PO MISC: ORAL | 11 refills | Status: DC

## 2019-02-12 MED ORDER — METRONIDAZOLE 500 MG PO TABS: 500.0000 mg | ORAL_TABLET | Freq: Two times a day (BID) | ORAL | 0 refills | Status: DC

## 2019-02-12 NOTE — Patient Instructions (Addendum)
Patricia [name], I greatly value your feedback.  If you receive a survey following your visit with [place] today, we appreciate you taking the time to fill it out.  Thanks, Joellyn Haff, CNM, [facility]  [facility] HOSPITAL HAS MOVED!!! It is now [facility] at [facility] ([address] [place], [place] [address]) Entrance located off of [address] Free 24/7 valet parking     Nausea & Vomiting  Have saltine crackers or pretzels by your bed and eat a few bites before you raise your head out of bed in the morning  Eat small frequent meals throughout the day instead of large meals  Drink plenty of fluids throughout the day to stay hydrated, just don't drink a lot of fluids with your meals.  This can make your stomach fill up faster making you feel sick  Do not brush your teeth right after you eat  Products with real ginger are good for nausea, like ginger ale and ginger hard candy Make sure it says made with real ginger!  Sucking on sour candy like lemon heads is also good for nausea  If your prenatal vitamins make you nauseated, take them at night so you will sleep through the nausea  Sea Bands  If you feel like you need medicine for the nausea & vomiting please let us know  If you are unable to keep any fluids or food down please let us know   Constipation  Drink plenty of fluid, preferably water, throughout the day  Eat foods high in fiber such as fruits, vegetables, and grains  Exercise, such as walking, is a good way to keep your bowels regular  Drink warm fluids, especially warm prune juice, or decaf coffee  Eat a 1/2 cup of real oatmeal (not instant), 1/2 cup applesauce, and 1/2-1 cup warm prune juice every day  If needed, you may take Colace (docusate sodium) stool softener once or twice a day to help keep the stool soft. If you are pregnant, wait until you are out of your first trimester (12-14 weeks of pregnancy)  If you still are having problems with  constipation, you may take Miralax once daily as needed to help keep your bowels regular.  If you are pregnant, wait until you are out of your first trimester (12-14 weeks of pregnancy)   First Trimester of Pregnancy The first trimester of pregnancy is from week 1 until the end of week 12 (months 1 through 3). A week after a sperm fertilizes an egg, the egg will implant on the wall of the uterus. This embryo will begin to develop into a baby. Genes from you and your partner are forming the baby. The female genes determine whether the baby is a boy or a girl. At 6-8 weeks, the eyes and face are formed, and the heartbeat can be seen on ultrasound. At the end of 12 weeks, all the baby's organs are formed.  Now that you are pregnant, you will want to do everything you can to have a healthy baby. Two of the most important things are to get good prenatal care and to follow your health care provider's instructions. Prenatal care is all the medical care you receive before the baby's birth. This care will help prevent, find, and treat any problems during the pregnancy and childbirth. BODY CHANGES Your body goes through many changes during pregnancy. The changes vary from woman to woman.   You may gain or lose a couple of pounds  at first.  You may feel sick to your stomach (nauseous) and throw up (vomit). If the vomiting is uncontrollable, call your health care provider.  You may tire easily.  You may develop headaches that can be relieved by medicines approved by your health care provider.  You may urinate more often. Painful urination may mean you have a bladder infection.  You may develop heartburn as a result of your pregnancy.  You may develop constipation because certain hormones are causing the muscles that push waste through your intestines to slow down.  You may develop hemorrhoids or swollen, bulging veins (varicose veins).  Your breasts may begin to grow larger and become tender. Your nipples  may stick out more, and the tissue that surrounds them (areola) may become darker.  Your gums may bleed and may be sensitive to brushing and flossing.  Dark spots or blotches (chloasma, mask of pregnancy) may develop on your face. This will likely fade after the baby is born.  Your menstrual periods will stop.  You may have a loss of appetite.  You may develop cravings for certain kinds of food.  You may have changes in your emotions from day to day, such as being excited to be pregnant or being concerned that something may go wrong with the pregnancy and baby.  You may have more vivid and strange dreams.  You may have changes in your hair. These can include thickening of your hair, rapid growth, and changes in texture. Some women also have hair loss during or after pregnancy, or hair that feels dry or thin. Your hair will most likely return to normal after your baby is born. WHAT TO EXPECT AT YOUR PRENATAL VISITS During a routine prenatal visit:  You will be weighed to make sure you and the baby are growing normally.  Your blood pressure will be taken.  Your abdomen will be measured to track your baby's growth.  The fetal heartbeat will be listened to starting around week 10 or 12 of your pregnancy.  Test results from any previous visits will be discussed. Your health care provider may ask you:  How you are feeling.  If you are feeling the baby move.  If you have had any abnormal symptoms, such as leaking fluid, bleeding, severe headaches, or abdominal cramping.  If you have any questions. Other tests that may be performed during your first trimester include:  Blood tests to find your blood type and to check for the presence of any previous infections. They will also be used to check for low iron levels (anemia) and Rh antibodies. Later in the pregnancy, blood tests for diabetes will be done along with other tests if problems develop.  Urine tests to check for infections,  diabetes, or protein in the urine.  An ultrasound to confirm the proper growth and development of the baby.  An amniocentesis to check for possible genetic problems.  Fetal screens for spina bifida and Down syndrome.  You may need other tests to make sure you and the baby are doing well. HOME CARE INSTRUCTIONS  Medicines  Follow your health care provider's instructions regarding medicine use. Specific medicines may be either safe or unsafe to take during pregnancy.  Take your prenatal vitamins as directed.  If you develop constipation, try taking a stool softener if your health care provider approves. Diet  Eat regular, well-balanced meals. Choose a variety of foods, such as meat or vegetable-based protein, fish, milk and low-fat dairy products, vegetables, fruits, and  whole grain breads and cereals. Your health care provider will help you determine the amount of weight gain that is right for you.  Avoid raw meat and uncooked cheese. These carry germs that can cause birth defects in the baby.  Eating four or five small meals rather than three large meals a day may help relieve nausea and vomiting. If you start to feel nauseous, eating a few soda crackers can be helpful. Drinking liquids between meals instead of during meals also seems to help nausea and vomiting.  If you develop constipation, eat more high-fiber foods, such as fresh vegetables or fruit and whole grains. Drink enough fluids to keep your urine clear or pale yellow. Activity and Exercise  Exercise only as directed by your health care provider. Exercising will help you:  Control your weight.  Stay in shape.  Be prepared for labor and delivery.  Experiencing pain or cramping in the lower abdomen or low back is a good sign that you should stop exercising. Check with your health care provider before continuing normal exercises.  Try to avoid standing for long periods of time. Move your legs often if you must stand in  one place for a long time.  Avoid heavy lifting.  Wear low-heeled shoes, and practice good posture.  You may continue to have sex unless your health care provider directs you otherwise. Relief of Pain or Discomfort  Wear a good support bra for breast tenderness.   Take warm sitz baths to soothe any pain or discomfort caused by hemorrhoids. Use hemorrhoid cream if your health care provider approves.   Rest with your legs elevated if you have leg cramps or low back pain.  If you develop varicose veins in your legs, wear support hose. Elevate your feet for 15 minutes, 3-4 times a day. Limit salt in your diet. Prenatal Care  Schedule your prenatal visits by the twelfth week of pregnancy. They are usually scheduled monthly at first, then more often in the last 2 months before delivery.  Write down your questions. Take them to your prenatal visits.  Keep all your prenatal visits as directed by your health care provider. Safety  Wear your seat belt at all times when driving.  Make a list of emergency phone numbers, including numbers for family, friends, the hospital, and police and fire departments. General Tips  Ask your health care provider for a referral to a local prenatal education class. Begin classes no later than at the beginning of month 6 of your pregnancy.  Ask for help if you have counseling or nutritional needs during pregnancy. Your health care provider can offer advice or refer you to specialists for help with various needs.  Do not use hot tubs, steam rooms, or saunas.  Do not douche or use tampons or scented sanitary pads.  Do not cross your legs for long periods of time.  Avoid cat litter boxes and soil used by cats. These carry germs that can cause birth defects in the baby and possibly loss of the fetus by miscarriage or stillbirth.  Avoid all smoking, herbs, alcohol, and medicines not prescribed by your health care provider. Chemicals in these affect the  formation and growth of the baby.  Schedule a dentist appointment. At home, brush your teeth with a soft toothbrush and be gentle when you floss. SEEK MEDICAL CARE IF:   You have dizziness.  You have mild pelvic cramps, pelvic pressure, or nagging pain in the abdominal area.  You have persistent  nausea, vomiting, or diarrhea.  You have a bad smelling vaginal discharge.  You have pain with urination.  You notice increased swelling in your face, hands, legs, or ankles. SEEK IMMEDIATE MEDICAL CARE IF:   You have a fever.  You are leaking fluid from your vagina.  You have spotting or bleeding from your vagina.  You have severe abdominal cramping or pain.  You have rapid weight gain or loss.  You vomit blood or material that looks like coffee grounds.  You are exposed to [place] measles and have never had them.  You are exposed to fifth disease or chickenpox.  You develop a severe headache.  You have shortness of breath.  You have any kind of trauma, such as from a fall or a car accident. Document Released: 11/07/2001 Document Revised: 03/30/2014 Document Reviewed: 09/23/2013 [facility] Patient Information 2015 [place], [place]. This information is not intended to replace advice given to you by your health care provider. Make sure you discuss any questions you have with your health care provider.  Coronavirus (COVID-19) Are you at risk?  Are you at risk for the Coronavirus (COVID-19)?  To be considered HIGH RISK for Coronavirus (COVID-19), you have to meet the following criteria:  . Traveled to [place], [place], [place], [place] or [place]; or in the [place] to [place], [place], [place], or [place]; and have fever, cough, and shortness of breath within the last 2 weeks of travel OR . Been in close contact with a person diagnosed with COVID-19 within the last 2 weeks and have fever, cough, and shortness of breath . IF YOU DO NOT MEET THESE CRITERIA, YOU ARE  CONSIDERED LOW RISK FOR COVID-19.  What to do if you are HIGH RISK for COVID-19?  Marland Kitchen If you are having a medical emergency, call 911. . Seek medical care right away. Before you go to a doctor's office, urgent care or emergency department, call ahead and tell them about your recent travel, contact with someone diagnosed with COVID-19, and your symptoms. You should receive instructions from your physician's office regarding next steps of care.  . When you arrive at healthcare provider, tell the healthcare staff immediately you have returned from visiting [place], [place], [place], [place] or [place]; or traveled in the [place] to [place], [place], [place], or [place]; in the last two weeks or you have been in close contact with a person diagnosed with COVID-19 in the last 2 weeks.   . Tell the health care staff about your symptoms: fever, cough and shortness of breath. . After you have been seen by a medical provider, you will be either: o Tested for (COVID-19) and discharged home on quarantine except to seek medical care if symptoms worsen, and asked to  - Stay home and avoid contact with others until you get your results (4-5 days)  - Avoid travel on public transportation if possible (such as bus, train, or airplane) or o Sent to the Emergency Department by EMS for evaluation, COVID-19 testing, and possible admission depending on your condition and test results.  What to do if you are LOW RISK for COVID-19?  Reduce your risk of any infection by using the same precautions used for avoiding the common cold or flu:  Marland Kitchen Wash your hands often with soap and warm water for at least 20 seconds.  If soap and water are not readily available, use an alcohol-based hand sanitizer with at least 60% alcohol.  . If coughing  or sneezing, cover your mouth and nose by coughing or sneezing into the elbow areas of your shirt or coat, into a tissue or into your sleeve (not your hands). . Avoid shaking hands  with others and consider head nods or verbal greetings only. . Avoid touching your eyes, nose, or mouth with unwashed hands.  . Avoid close contact with people who are sick. . Avoid places or events with large numbers of people in one location, like concerts or sporting events. . Carefully consider travel plans you have or are making. . If you are planning any travel outside or inside the [place], visit the CDC's Travelers' Health webpage for the latest health notices. . If you have some symptoms but not all symptoms, continue to monitor at home and seek medical attention if your symptoms worsen. . If you are having a medical emergency, call 911.   ADDITIONAL HEALTHCARE OPTIONS FOR PATIENTS  [facility] / e-Visit: [email or web address]         [facility]: [phone]  [facility]: [phone]                   [facility]: [phone]

## 2019-02-12 NOTE — Progress Notes (Signed)
INITIAL OBSTETRICAL VISIT Patient name: Patricia [name] MRN [id number]  Date of birth: Aug 20, 1991 Chief Complaint:   Initial Prenatal Visit  History of Present Illness:   Patricia [name] is a 28 y.o. [phone] African American female at [redacted]w[redacted]d by LMP c/w 7wk u/s, with an Estimated Date of Delivery: 09/14/19 being seen today for her initial obstetrical visit.   Her obstetrical history is significant for SAB x 1, SVB x 3, h/o A1DM w/ 1 pregnancy, 35wk PTB 2nd pregnancy d/t PTL, took Makena last pregnancy- delivered at 39wks. H/O rapid labor.  Today she reports no complaints. Pregnancy not planned, was on COCs and using condoms.  Patient's last menstrual period was 12/08/2018 (exact date). Last pap >9yrs ago. Results were: normal [name]:   Pertinent items are noted in HPI Denies cramping/contractions, leakage of fluid, vaginal bleeding, abnormal vaginal discharge w/ itching/odor/irritation, headaches, visual changes, shortness of breath, chest pain, abdominal pain, severe nausea/vomiting, or problems with urination or bowel movements unless otherwise stated above.  Pertinent History Reviewed:  Reviewed past medical,surgical, social, obstetrical and family history.  Reviewed problem list, medications and allergies. OB History  Gravida Para Term Preterm AB Living  5 3 2 1 1 3   SAB TAB Ectopic Multiple Live Births  1     0 3    # Outcome Date GA Lbr Len/2nd Weight Sex Delivery Anes PTL Lv  5 Current           4 SAB 02/02/18          3 Term 10/04/15 [redacted]w[redacted]d 03:47 / 00:08 6 lb 9.1 oz (2.98 kg) F Vag-Spont None N LIV  2 Preterm 04/20/13 [redacted]w[redacted]d 10:57 / 00:03 6 lb 1 oz (2.75 kg) M Vag-Spont EPI Y LIV     Complications: Preterm premature rupture of membranes (PPROM) delivered, current hospitalization  1 Term 07/31/11 [redacted]w[redacted]d 21:47 / 00:46 8 lb (3.63 kg) M Vag-Spont EPI N LIV   Physical Assessment:   Vitals:   02/12/19 0941  BP: 135/74  Pulse: 87  Weight: (!) 350 lb (158.8 kg)  Body  mass index is 54.82 kg/m.       Physical Examination:  General appearance - well appearing, and in no distress  Mental status - alert, oriented to person, place, and time  Psych:  She has a normal mood and affect  Skin - warm and dry, normal color, no suspicious lesions noted  Chest - effort normal, all lung fields clear to auscultation bilaterally  Heart - normal rate and regular rhythm  Abdomen - soft, nontender  Extremities:  No swelling or varicosities noted  Pelvic - VULVA: normal appearing vulva with no masses, tenderness or lesions  VAGINA: normal appearing vagina with normal color and malodorous thin discharge, no lesions  CERVIX: normal appearing cervix without discharge or lesions, no CMT  Thin prep pap is done w/ reflex HR HPV cotesting  Fetal Heart Rate (bpm): +u/s via informal transabdominal u/s  Results for orders placed or performed in visit on 02/12/19 (from the past 24 hour(s))  POC Urinalysis Dipstick OB   Collection Time: 02/12/19 10:03 AM  Result Value Ref Range   Color, UA     Clarity, UA     Glucose, UA Negative Negative   Bilirubin, UA     Ketones, UA neg    Spec Grav, UA     Blood, UA neg    pH, UA     POC,PROTEIN,UA Negative Negative, Trace, Small (1+), Moderate (  2+), Large (3+), 4+   Urobilinogen, UA     Nitrite, UA neg    Leukocytes, UA Negative Negative   Appearance     Odor    POCT Wet Prep [facility] [place])   Collection Time: 02/12/19  1:10 PM  Result Value Ref Range   Source Wet Prep POC vaginal    WBC, Wet Prep HPF POC few    Bacteria Wet Prep HPF POC Few Few   BACTERIA WET PREP MORPHOLOGY POC     Clue Cells Wet Prep HPF POC Many (A) None   Clue Cells Wet Prep Whiff POC Positive Whiff    Yeast Wet Prep HPF POC None None   KOH Wet Prep POC     Trichomonas Wet Prep HPF POC Absent Absent    Assessment & Plan:  1) Low-Risk Pregnancy [id number] at [redacted]w[redacted]d with an Estimated Date of Delivery: 09/14/19   2) Initial OB visit  3) H/O 35wk PTB> d/t PTL,  offered Makena, wants, will order next visit  4) H/O A1DM> check A1C today  5) H/O PPD  6) BV> Rx metronidazole 500mg  BID x 7d for BV, no sex while taking   Meds:  Meds ordered this encounter  Medications  . metroNIDAZOLE (FLAGYL) 500 MG tablet    Sig: Take 1 tablet (500 mg total) by mouth 2 (two) times daily.    Dispense:  14 tablet    Refill:  0    Order Specific Question:   Supervising Provider    Answer:   Despina Hidden, LUTHER H [[id number]]  . Prenat w/o [id number] (CITRANATAL ASSURE) 35-1 & 300 MG tablet    Sig: One tablet and one capsule daily    Dispense:  60 tablet    Refill:  11    Order Specific Question:   Supervising Provider    Answer:   Lazaro Arms [[id number]]    Initial labs obtained Continue prenatal vitamins Reviewed n/v relief measures and warning s/s to report Reviewed recommended weight gain based on pre-gravid BMI Encouraged well-balanced diet Genetic Screening discussed: requested NT/IT and MaterniT21 (d/t BMI will wait until next visit) Cystic fibrosis, SMA, Fragile X screening discussed requested Ultrasound discussed; fetal survey: requested CCNC completed>PCM not here Flu shot today  Follow-up: Return in about 4 weeks (around 03/12/2019) for LROB, US:NT+1stIT & MaterniT21.   Orders Placed This Encounter  Procedures  . Urine Culture  . US Fetal Nuchal Translucency Measurement  . Flu Vaccine QUAD 36+ mos IM  . Obstetric Panel, Including HIV  . Urinalysis, Routine w reflex microscopic  . Pain Management Screening Profile (10S)  . SMN1 COPY NUMBER ANALYSIS (SMA Carrier Screen)  . Fragile X, PCR and Southern  . Hemoglobin A1c  . POC Urinalysis Dipstick OB  . POCT Wet Prep [facility])    Cheral Marker CNM, [facility] 02/12/2019 1:12 PM

## 2019-02-13 LAB — PMP SCREEN PROFILE (10S), URINE
Amphetamine Scrn, Ur: NEGATIVE ng/mL
BARBITURATE SCREEN URINE: NEGATIVE ng/mL
BENZODIAZEPINE SCREEN, URINE: NEGATIVE ng/mL
CANNABINOIDS UR QL SCN: NEGATIVE ng/mL
Cocaine (Metab) Scrn, Ur: NEGATIVE ng/mL
Creatinine(Crt), U: 190.7 mg/dL (ref 20.0–300.0)
METHADONE SCREEN, URINE: NEGATIVE ng/mL
OXYCODONE+OXYMORPHONE UR QL SCN: NEGATIVE ng/mL
Opiate Scrn, Ur: NEGATIVE ng/mL
Ph of Urine: 6.9 (ref 4.5–8.9)
Phencyclidine Qn, Ur: NEGATIVE ng/mL
Propoxyphene Scrn, Ur: NEGATIVE ng/mL

## 2019-02-13 LAB — OBSTETRIC PANEL, INCLUDING HIV
ANTIBODY SCREEN: NEGATIVE
Basophils Absolute: 0 10*3/uL (ref 0.0–0.2)
Basos: 1 %
EOS (ABSOLUTE): 0.1 10*3/uL (ref 0.0–0.4)
EOS: 1 %
HIV Screen 4th Generation wRfx: NONREACTIVE
Hematocrit: 34.2 % (ref 34.0–46.6)
Hemoglobin: 11.5 g/dL (ref 11.1–15.9)
Hepatitis B Surface Ag: NEGATIVE
IMMATURE GRANS (ABS): 0 10*3/uL (ref 0.0–0.1)
IMMATURE GRANULOCYTES: 1 %
LYMPHS: 30 %
Lymphocytes Absolute: 1.7 10*3/uL (ref 0.7–3.1)
MCH: 27.8 pg (ref 26.6–33.0)
MCHC: 33.6 g/dL (ref 31.5–35.7)
MCV: 83 fL (ref 79–97)
MONOS ABS: 0.4 10*3/uL (ref 0.1–0.9)
Monocytes: 7 %
NEUTROS PCT: 60 %
Neutrophils Absolute: 3.5 10*3/uL (ref 1.4–7.0)
PLATELETS: 215 10*3/uL (ref 150–450)
RBC: 4.14 x10E6/uL (ref 3.77–5.28)
RDW: 13.3 % (ref 11.7–15.4)
RH TYPE: POSITIVE
RPR Ser Ql: NONREACTIVE
Rubella Antibodies, IGG: 9.47 index (ref >0.99)
WBC: 5.7 10*3/uL (ref 3.4–10.8)

## 2019-02-13 LAB — URINALYSIS, ROUTINE W REFLEX MICROSCOPIC
Bilirubin, UA: NEGATIVE
Glucose, UA: NEGATIVE
Ketones, UA: NEGATIVE
Nitrite, UA: NEGATIVE
Protein, UA: NEGATIVE
RBC, UA: NEGATIVE
Specific Gravity, UA: 1.02 (ref 1.005–1.030)
Urobilinogen, Ur: 1 mg/dL (ref 0.2–1.0)
pH, UA: 7 (ref 5.0–7.5)

## 2019-02-13 LAB — MICROSCOPIC EXAMINATION: Casts: NONE SEEN /lpf

## 2019-02-13 LAB — HGB A1C W/O EAG: Hgb A1c MFr Bld: 5.5 % (ref 4.8–5.6)

## 2019-02-13 LAB — CYTOLOGY - PAP
Chlamydia: NEGATIVE
Diagnosis: NEGATIVE
Neisseria Gonorrhea: NEGATIVE

## 2019-02-14 ENCOUNTER — Other Ambulatory Visit: Payer: Self-pay | Admitting: Women's Health

## 2019-02-14 DIAGNOSIS — R8271 Bacteriuria: Secondary | ICD-10-CM

## 2019-02-14 DIAGNOSIS — O9989 Other specified diseases and conditions complicating pregnancy, childbirth and the puerperium: Principal | ICD-10-CM

## 2019-02-14 DIAGNOSIS — O99891 Other specified diseases and conditions complicating pregnancy: Secondary | ICD-10-CM | POA: Insufficient documentation

## 2019-02-14 LAB — URINE CULTURE

## 2019-02-14 MED ORDER — CEPHALEXIN 500 MG PO CAPS: 500.0000 mg | ORAL_CAPSULE | Freq: Four times a day (QID) | ORAL | 0 refills | Status: DC

## 2019-02-28 LAB — SMN1 COPY NUMBER ANALYSIS (SMA CARRIER SCREENING)

## 2019-02-28 LAB — FRAGILE X, PCR AND SOUTHERN

## 2019-03-05 ENCOUNTER — Encounter: Payer: Self-pay | Admitting: *Deleted

## 2019-03-05 ENCOUNTER — Telehealth: Payer: Self-pay | Admitting: *Deleted

## 2019-03-05 ENCOUNTER — Other Ambulatory Visit: Payer: Self-pay | Admitting: Advanced Practice Midwife

## 2019-03-05 MED ORDER — ONDANSETRON 4 MG PO TBDP: 4.0000 mg | ORAL_TABLET | Freq: Three times a day (TID) | ORAL | 1 refills | Status: DC | PRN

## 2019-03-05 NOTE — Progress Notes (Signed)
zofran for nausea.

## 2019-03-05 NOTE — Telephone Encounter (Signed)
Patient states she has been thinking about terminating the pregnancy as she has a lot going on a home and doesn't think she is emotionally ready for another child.  Informed patient that we do not terminate pregnancy in our office but I could give her the number to a couple of places but was unsure if they would do them d/t her gestation.  Pt verbalized understanding.

## 2019-03-11 ENCOUNTER — Telehealth: Payer: Self-pay | Admitting: *Deleted

## 2019-03-11 NOTE — Telephone Encounter (Signed)
Spoke with pt letting her know no visitors or children at tomorrow's appt. Also, pt hasn't come in contact with someone in the last month that has been confirmed or suspected of having Covid-19 nor is she experiencing any symptoms of Covid-19. Pt plans on coming to appt. JSY

## 2019-03-12 ENCOUNTER — Encounter: Payer: Self-pay | Admitting: Obstetrics and Gynecology

## 2019-03-12 ENCOUNTER — Other Ambulatory Visit: Payer: Self-pay

## 2019-03-12 ENCOUNTER — Ambulatory Visit (INDEPENDENT_AMBULATORY_CARE_PROVIDER_SITE_OTHER): Payer: Medicaid Other | Admitting: Obstetrics and Gynecology

## 2019-03-12 ENCOUNTER — Ambulatory Visit (INDEPENDENT_AMBULATORY_CARE_PROVIDER_SITE_OTHER): Payer: BC Managed Care – PPO

## 2019-03-12 VITALS — BP 106/62 | HR 83 | Temp 98.5°F | Wt 346.0 lb

## 2019-03-12 DIAGNOSIS — Z3A13 13 weeks gestation of pregnancy: Secondary | ICD-10-CM | POA: Diagnosis not present

## 2019-03-12 DIAGNOSIS — Z3682 Encounter for antenatal screening for nuchal translucency: Secondary | ICD-10-CM | POA: Diagnosis not present

## 2019-03-12 DIAGNOSIS — Z331 Pregnant state, incidental: Secondary | ICD-10-CM

## 2019-03-12 DIAGNOSIS — Z1379 Encounter for other screening for genetic and chromosomal anomalies: Secondary | ICD-10-CM

## 2019-03-12 DIAGNOSIS — Z1389 Encounter for screening for other disorder: Secondary | ICD-10-CM

## 2019-03-12 DIAGNOSIS — Z3481 Encounter for supervision of other normal pregnancy, first trimester: Secondary | ICD-10-CM

## 2019-03-12 LAB — POCT URINALYSIS DIPSTICK OB
Blood, UA: NEGATIVE
Glucose, UA: NEGATIVE
Ketones, UA: NEGATIVE
Leukocytes, UA: NEGATIVE
Nitrite, UA: NEGATIVE
POC,PROTEIN,UA: NEGATIVE

## 2019-03-12 NOTE — Assessment & Plan Note (Signed)
Body mass index is 54.19 kg/m.

## 2019-03-12 NOTE — Progress Notes (Signed)
[place] 13+3 wks,measurements c/w dates,normal ovaries bilat,anterior placenta gr 0,crl 78.91 mm,NB present,NT 1.9 mm,fhr 152 bpm

## 2019-03-12 NOTE — Progress Notes (Signed)
   LOW-RISK PREGNANCY VISIT Patient name: Patricia [name] MRN [id number]  Date of birth: 1991/01/04 Chief Complaint:   Routine Prenatal Visit (NT/IT, materniT2)  History of Present Illness:   Patricia [name] is a 28 y.o. [phone] female at [redacted]w[redacted]d with an Estimated Date of Delivery: 09/14/19 being seen today for ongoing management of a low-risk pregnancy.  Patient's been losing weight has had a 10 pound weight loss.  She is drinking plenty of fluids just has limited appetite no spitting and drooling Today she reports no complaints and anorexia.  .  .  Movement: Absent. denies leaking of fluid. [name]:   Pertinent items are noted in HPI Denies abnormal vaginal discharge w/ itching/odor/irritation, headaches, visual changes, shortness of breath, chest pain, abdominal pain, severe nausea/vomiting, or problems with urination or bowel movements unless otherwise stated above. Pertinent History Reviewed:  Reviewed past medical,surgical, social, obstetrical and family history.  Reviewed problem list, medications and allergies. Physical Assessment:   Vitals:   03/12/19 0914  BP: 106/62  Pulse: 83  Temp: 98.5 F (36.9 C)  Weight: (!) 156.9 kg  Body mass index is 54.19 kg/m.        Physical Examination:   General appearance: Well appearing, and in no distress  Mental status: Alert, oriented to person, place, and time  Skin: Warm & dry  Cardiovascular: Normal heart rate noted  Respiratory: Normal respiratory effort, no distress  Abdomen: Soft, gravid, nontender  Pelvic: Not done        Extremities: Edema: None  Fetal Status:     Movement: Absent    Results for orders placed or performed in visit on 03/12/19 (from the past 24 hour(s))  POC Urinalysis Dipstick OB   Collection Time: 03/12/19  9:19 AM  Result Value Ref Range   Color, UA     Clarity, UA     Glucose, UA Negative Negative   Bilirubin, UA     Ketones, UA neg    Spec Grav, UA     Blood, UA neg    pH, UA     POC,PROTEIN,UA Negative Negative, Trace, Small (1+), Moderate (2+), Large (3+), 4+   Urobilinogen, UA     Nitrite, UA neg    Leukocytes, UA Negative Negative   Appearance     Odor      Assessment & Plan:  1) Low-risk pregnancy [id number] at [redacted]w[redacted]d with an Estimated Date of Delivery: 09/14/19   2) history of A1 diabetes, history of 35-week preterm birth, wants to sign tubal papers after 20 weeks,,    Meds: No orders of the defined types were placed in this encounter.  Labs/procedures today: Ultrasound for NT shows nasal bone present and normal nuchal fold  Plan:  Continue routine obstetrical care tubal papers after 20 weeks, current weight loss being managed by patient to her satisfaction  Reviewed:    Follow-up: Return in about 4 weeks (around 04/09/2019) for Labs: 2nd IT,m can do tele visit.  Orders Placed This Encounter  Procedures  . Urine Culture  . Integrated 1  . POC Urinalysis Dipstick OB   Tilda Burrow CNM, [facility] 03/12/2019 9:45 AM

## 2019-03-14 LAB — INTEGRATED 1
Crown Rump Length: 78.9 mm
Gest. Age on Collection Date: 13.6 weeks
Maternal Age at EDD: 27.1 yr
Nuchal Translucency (NT): 1.8 mm
Number of Fetuses: 1
PAPP-A Value: 1272.2 ng/mL
Weight: 346 [lb_av]

## 2019-03-14 LAB — URINE CULTURE

## 2019-03-18 ENCOUNTER — Other Ambulatory Visit: Payer: Self-pay | Admitting: Women's Health

## 2019-04-15 ENCOUNTER — Other Ambulatory Visit: Payer: Self-pay | Admitting: Obstetrics and Gynecology

## 2019-04-15 ENCOUNTER — Encounter: Payer: Self-pay | Admitting: *Deleted

## 2019-04-15 DIAGNOSIS — Z363 Encounter for antenatal screening for malformations: Secondary | ICD-10-CM

## 2019-04-16 ENCOUNTER — Other Ambulatory Visit: Payer: Self-pay

## 2019-04-16 ENCOUNTER — Ambulatory Visit (INDEPENDENT_AMBULATORY_CARE_PROVIDER_SITE_OTHER): Payer: Medicaid Other | Admitting: Obstetrics and Gynecology

## 2019-04-16 ENCOUNTER — Encounter: Payer: Self-pay | Admitting: Obstetrics and Gynecology

## 2019-04-16 ENCOUNTER — Ambulatory Visit (INDEPENDENT_AMBULATORY_CARE_PROVIDER_SITE_OTHER): Payer: BC Managed Care – PPO

## 2019-04-16 VITALS — BP 109/62 | HR 103 | Temp 98.9°F | Wt 345.2 lb

## 2019-04-16 DIAGNOSIS — Z3A18 18 weeks gestation of pregnancy: Secondary | ICD-10-CM

## 2019-04-16 DIAGNOSIS — Z1389 Encounter for screening for other disorder: Secondary | ICD-10-CM

## 2019-04-16 DIAGNOSIS — Z331 Pregnant state, incidental: Secondary | ICD-10-CM

## 2019-04-16 DIAGNOSIS — Z3402 Encounter for supervision of normal first pregnancy, second trimester: Secondary | ICD-10-CM

## 2019-04-16 DIAGNOSIS — Z363 Encounter for antenatal screening for malformations: Secondary | ICD-10-CM | POA: Diagnosis not present

## 2019-04-16 DIAGNOSIS — Z1379 Encounter for other screening for genetic and chromosomal anomalies: Secondary | ICD-10-CM

## 2019-04-16 DIAGNOSIS — Z3482 Encounter for supervision of other normal pregnancy, second trimester: Secondary | ICD-10-CM

## 2019-04-16 LAB — POCT URINALYSIS DIPSTICK OB
Blood, UA: NEGATIVE
Glucose, UA: NEGATIVE
Leukocytes, UA: NEGATIVE
Nitrite, UA: NEGATIVE
POC,PROTEIN,UA: NEGATIVE

## 2019-04-16 NOTE — Progress Notes (Signed)
[place] 18+3 wks,breech,cx 4.3 cm,anterior fundal placenta gr 0,normal ovaries bilat,fhr 146 bpm,svp of fluid 3.5 cm,efw 250 g 56%,anatomy complete,no obvious abnormalities

## 2019-04-16 NOTE — Progress Notes (Signed)
Patient ID: Patricia [name], female   DOB: 1991-11-13, 28 y.o.   MRN: [id number]    LOW-RISK PREGNANCY VISIT Patient name: Patricia [name] MRN [id number]  Date of birth: 06-May-1991 Chief Complaint:   Routine Prenatal Visit ([place]; 2nd IT)  History of Present Illness:   Patricia [name] is a 28 y.o. [phone] female at [redacted]w[redacted]d with an Estimated Date of Delivery: 09/14/19 being seen today for ongoing management of a low-risk pregnancy. Pt has history of GDM A1 dm. Hasn't decided if she wants to get 17P injection due to her 2nd pregnancy preterm delivery at 35 weeks. Today she reports no complaints. Contractions: Not present. Vag. Bleeding: None.  Movement: Present. denies leaking of fluid. [name]:   Pertinent items are noted in HPI Denies abnormal vaginal discharge w/ itching/odor/irritation, headaches, visual changes, shortness of breath, chest pain, abdominal pain, severe nausea/vomiting, or problems with urination or bowel movements unless otherwise stated above. Pertinent History Reviewed:  Reviewed past medical,surgical, social, obstetrical and family history.  Reviewed problem list, medications and allergies. Physical Assessment:   Vitals:   04/16/19 1020  BP: 109/62  Pulse: (!) 103  Temp: 98.9 F (37.2 C)  Weight: (!) 345 lb 3.2 oz (156.6 kg)  Body mass index is 54.07 kg/m.        Physical Examination:   General appearance: Well appearing, and in no distress  Mental status: Alert, oriented to person, place, and time  Skin: Warm & dry  Cardiovascular: Normal heart rate noted  Respiratory: Normal respiratory effort, no distress  Abdomen: Soft, gravid, nontender  Pelvic: Cervical exam deferred         Extremities: Edema: Trace  Fetal Status: Fetal Heart Rate (bpm): 146 u/s   Movement: Present    Results for orders placed or performed in visit on 04/16/19 (from the past 24 hour(s))  POC Urinalysis Dipstick OB   Collection Time: 04/16/19 10:22 AM  Result Value Ref Range    Color, UA     Clarity, UA     Glucose, UA Negative Negative   Bilirubin, UA     Ketones, UA trace    Spec Grav, UA     Blood, UA neg    pH, UA     POC,PROTEIN,UA Negative Negative, Trace, Small (1+), Moderate (2+), Large (3+), 4+   Urobilinogen, UA     Nitrite, UA neg    Leukocytes, UA Negative Negative   Appearance     Odor      Assessment & Plan:  1) Low-risk pregnancy [id number] at [redacted]w[redacted]d with an Estimated Date of Delivery: 09/14/19   2) hx of gestational A1 dm and preterm delivery, no decision on 17 p injection   Meds: No orders of the defined types were placed in this encounter.  Labs/procedures today: 2nd Its, [place] 18+3 wks,breech,cx 4.3 cm,anterior fundal placenta gr 0,normal ovaries bilat,fhr 146 bpm,svp of fluid 3.5 cm,efw 250 g 56%,anatomy complete,no obvious abnormalities   Plan:   1. Continue routine obstetrical care  2. Tubal consent forms after 20 weeks 3. F/u in 4 weeks for TELEVISIT 4. TSH , HGB A1C  Follow-up: No follow-ups on file.  Orders Placed This Encounter  Procedures  . INTEGRATED 2  . POC Urinalysis Dipstick OB   By signing my name below, I, Arnette Norris, attest that this documentation has been prepared under the direction and in the presence of Tilda Burrow, MD. Electronically Signed: Arnette Norris Medical Scribe. 04/16/19. 10:43 AM.  I personally  performed the services described in this documentation, which was SCRIBED in my presence. The recorded information has been reviewed and considered accurate. It has been edited as necessary during review. Jonnie Kind, MD

## 2019-04-16 NOTE — Patient Instructions (Signed)
Hemoglobin A1c Test Why am I having this test? You may have the hemoglobin A1c test (HbA1c test) done to:  Evaluate your risk for developing diabetes (diabetes mellitus).  Diagnose diabetes.  Monitor long-term control of blood sugar (glucose) in people who have diabetes and help make treatment decisions. This test may be done with other blood glucose tests, such as fasting blood glucose and oral glucose tolerance tests. What is being tested? Hemoglobin is a type of protein in the blood that carries oxygen. Glucose attaches to hemoglobin to form glycated hemoglobin. This test checks the amount of glycated hemoglobin in your blood, which is a good indicator of the average amount of glucose in your blood during the past 2-3 months. What kind of sample is taken?  A blood sample is required for this test. It is usually collected by inserting a needle into a blood vessel. Tell a health care provider about:  All medicines you are taking, including vitamins, herbs, eye drops, creams, and over-the-counter medicines.  Any blood disorders you have.  Any surgeries you have had.  Any medical conditions you have.  Whether you are pregnant or may be pregnant. How are the results reported? Your results will be reported as a percentage that indicates how much of your hemoglobin has glucose attached to it (is glycated). Your health care provider will compare your results to normal ranges that were established after testing a large group of people (reference ranges). Reference ranges may vary among labs and hospitals. For this test, common reference ranges are:  Adult or child without diabetes: 4-5.6%.  Adult or child with diabetes and good blood glucose control: less than 7%. What do the results mean? If you have diabetes:  A result of less than 7% is considered normal, meaning that your blood glucose is well controlled.  A result higher than 7% means that your blood glucose is not well  controlled, and your treatment plan may need to be adjusted. If you do not have diabetes:  A result within the reference range is considered normal, meaning that you are not at high risk for diabetes.  A result of 5.7-6.4% means that you have a high risk of developing diabetes, and you may have prediabetes. Prediabetes is the condition of having a blood glucose level that is higher than it should be, but not high enough for you to be diagnosed with diabetes. Having prediabetes puts you at risk for developing type 2 diabetes (type 2 diabetes mellitus). You may have more tests, including a repeat HbA1c test.  Results of 6.5% or higher on two separate HbA1c tests mean that you have diabetes. You may have more tests to confirm the diagnosis. Abnormally low HbA1c values may be caused by:  Pregnancy.  Severe blood loss.  Receiving donated blood (transfusions).  Low red blood cell count (anemia).  Long-term kidney failure.  Some unusual forms (variants) of hemoglobin. Talk with your health care provider about what your results mean. Questions to ask your health care provider Ask your health care provider, or the department that is doing the test:  When will my results be ready?  How will I get my results?  What are my treatment options?  What other tests do I need?  What are my next steps? Summary  The hemoglobin A1c test (HbA1c test) may be done to evaluate your risk for developing diabetes, to diagnose diabetes, and to monitor long-term control of blood sugar (glucose) in people who have diabetes and help make  treatment decisions.  Hemoglobin is a type of protein in the blood that carries oxygen. Glucose attaches to hemoglobin to form glycated hemoglobin. This test checks the amount of glycated hemoglobin in your blood, which is a good indicator of the average amount of glucose in your blood during the past 2-3 months.  Talk with your health care provider about what your results  mean. This information is not intended to replace advice given to you by your health care provider. Make sure you discuss any questions you have with your health care provider. Document Released: 12/05/2004 Document Revised: 06/26/2017 Document Reviewed: 06/26/2017 Elsevier Interactive Patient Education  2019 [employer].

## 2019-04-17 LAB — HEMOGLOBIN A1C
Est. average glucose Bld gHb Est-mCnc: 108 mg/dL
Hgb A1c MFr Bld: 5.4 % (ref 4.8–5.6)

## 2019-04-17 LAB — TSH: TSH: 1.49 u[IU]/mL (ref 0.450–4.500)

## 2019-04-18 LAB — INTEGRATED 2
AFP MoM: 1.67
Alpha-Fetoprotein: 34.2 ng/mL
Crown Rump Length: 78.9 mm
DIA MoM: 1.13
DIA Value: 125.5 pg/mL
Estriol, Unconjugated: 1.13 ng/mL
Gest. Age on Collection Date: 13.6 weeks
Gestational Age: 18.6 weeks
Maternal Age at EDD: 27.1 yr
Nuchal Translucency (NT): 1.8 mm
Nuchal Translucency MoM: 0.97
Number of Fetuses: 1
PAPP-A MoM: 2.74
PAPP-A Value: 1272.2 ng/mL
Test Results:: NEGATIVE
Weight: 345 [lb_av]
Weight: 346 [lb_av]
hCG MoM: 2.22
hCG Value: 25.4 IU/mL
uE3 MoM: 1.01

## 2019-05-12 ENCOUNTER — Encounter: Payer: Self-pay | Admitting: *Deleted

## 2019-05-13 ENCOUNTER — Encounter: Payer: Self-pay | Admitting: Women's Health

## 2019-05-13 ENCOUNTER — Other Ambulatory Visit: Payer: Self-pay

## 2019-05-13 ENCOUNTER — Ambulatory Visit (INDEPENDENT_AMBULATORY_CARE_PROVIDER_SITE_OTHER): Payer: BC Managed Care – PPO | Admitting: Women's Health

## 2019-05-13 VITALS — BP 131/74 | HR 93 | Wt 345.0 lb

## 2019-05-13 DIAGNOSIS — Z3482 Encounter for supervision of other normal pregnancy, second trimester: Secondary | ICD-10-CM

## 2019-05-13 DIAGNOSIS — Z8751 Personal history of pre-term labor: Secondary | ICD-10-CM

## 2019-05-13 DIAGNOSIS — Z3A22 22 weeks gestation of pregnancy: Secondary | ICD-10-CM

## 2019-05-13 MED ORDER — BLOOD PRESSURE MONITOR MISC: 0 refills | Status: DC

## 2019-05-13 NOTE — Progress Notes (Signed)
TELEHEALTH VIRTUAL OBSTETRICS VISIT ENCOUNTER NOTE Patient name: Patricia [name] MRN [id number]  Date of birth: 03-Jul-1991  I connected with patient on 05/13/19 at  8:30 AM EDT by [facility] and verified that I am speaking with the correct person using two identifiers. Due to COVID-19 recommendations, pt is not currently in our office.    I discussed the limitations, risks, security and privacy concerns of performing an evaluation and management service by telephone and the availability of in person appointments. I also discussed with the patient that there may be a patient responsible charge related to this service. The patient expressed understanding and agreed to proceed.  Chief Complaint:   Routine Prenatal Visit (feet swelling)  History of Present Illness:   Patricia [name] is a 28 y.o. [phone] female at [redacted]w[redacted]d with an Estimated Date of Delivery: 09/14/19 being evaluated today for ongoing management of a low-risk pregnancy.  Today she reports swelling legs, low back pain, pressure. Has decided she does want Makena. Contractions: Not present. Vag. Bleeding: None.  Movement: Present. denies leaking of fluid. [name]:   Pertinent items are noted in HPI Denies abnormal vaginal discharge w/ itching/odor/irritation, headaches, visual changes, shortness of breath, chest pain, abdominal pain, severe nausea/vomiting, or problems with urination or bowel movements unless otherwise stated above. Pertinent History Reviewed:  Reviewed past medical,surgical, social, obstetrical and family history.  Reviewed problem list, medications and allergies. Physical Assessment:   Vitals:   05/13/19 0923 05/13/19 0930  BP: 131/81 131/74  Pulse: 91 93  Weight: (!) 345 lb (156.5 kg)   Body mass index is 54.03 kg/m.        Physical Examination:   General:  Alert, oriented and cooperative.   Mental Status: Normal mood and affect perceived. Normal judgment and thought content.  Rest of physical exam  deferred due to type of encounter  No results found for this or any previous visit (from the past 24 hour(s)).  Assessment & Plan:  1) Pregnancy [id number] at [redacted]w[redacted]d with an Estimated Date of Delivery: 09/14/19   2) H/O 35wk PTB, wants to start Makena, to come in to order/get first dose before 23.6wks  3) H/O A1DM> early A1C 5.5   Meds:  Meds ordered this encounter  Medications  . Blood Pressure Monitor MISC    Sig: For regular home bp monitoring during pregnancy    Dispense:  1 each    Refill:  0    Dx: z34.90    Order Specific Question:   Supervising Provider    Answer:   Florian Buff [[id number]]    Labs/procedures today: none  Plan:  Continue routine obstetrical care. Does not have BP cuff.  Rx faxed to [place]. Check bp weekly, let us know if >140/90.   Reviewed: Preterm labor symptoms and general obstetric precautions including but not limited to vaginal bleeding, contractions, leaking of fluid and fetal movement were reviewed in detail with the patient. The patient was advised to call back or seek an in-person office evaluation/go to [facility] at [facility] for any urgent or concerning symptoms. All questions were answered. Please refer to After Visit Summary for other counseling recommendations.   I provided 15 minutes of non-face-to-face time during this encounter.  Follow-up: Return for asap for Makena (sign paper to order when she comes); weekly; then 7/20 for LROB in person w/ PN2.  No orders of the defined types were placed in this encounter.  [place], WHNP-BC  05/13/2019 10:14 AM

## 2019-05-13 NOTE — Patient Instructions (Signed)
Patricia [name], I greatly value your feedback.  If you receive a survey following your visit with us today, we appreciate you taking the time to fill it out.  Thanks, Patricia [name] , CNM, WHNP-BC   You will have your sugar test next visit.  Please do not eat or drink anything after midnight the night before you come, not even water.  You will be here for at least two hours.     [facility] is now [facility] ([address] [place], [place] [id number]) Entrance located off of [address] Free 24/7 valet parking   For your lower back pain you may:  Purchase a pregnancy belt from [employer], [employer], [employer], etc and wear it while you are up and about  Take warm baths  Use a heating pad to your lower back for no longer than 20 minutes at a time, and do not place near abdomen  Take tylenol as needed. Please follow directions on the bottle  Kinesiology tape (can get from sporting goods store), google how to tape belly for pregnancy    Call the office [phone]) or go to [facility] if:  You begin to have strong, frequent contractions  Your water breaks.  Sometimes it is a big gush of fluid, sometimes it is just a trickle that keeps getting your panties wet or running down your legs  You have vaginal bleeding.  It is normal to have a small amount of spotting if your cervix was checked.   You don't feel your baby moving like normal.  If you don't, get you something to eat and drink and lay down and focus on feeling your baby move.   If your baby is still not moving like normal, you should call the office or go to [facility].  Second Trimester of Pregnancy The second trimester is from week 13 through week 28, months 4 through 6. The second trimester is often a time when you feel your best. Your body has also adjusted to being pregnant, and you begin to feel better physically. Usually, morning sickness has lessened or quit  completely, you may have more energy, and you may have an increase in appetite. The second trimester is also a time when the fetus is growing rapidly. At the end of the sixth month, the fetus is about 9 inches long and weighs about 1 pounds. You will likely begin to feel the baby move (quickening) between 18 and 20 weeks of the pregnancy. BODY CHANGES Your body goes through many changes during pregnancy. The changes vary from woman to woman.   Your weight will continue to increase. You will notice your lower abdomen bulging out.  You may begin to get stretch marks on your hips, abdomen, and breasts.  You may develop headaches that can be relieved by medicines approved by your health care provider.  You may urinate more often because the fetus is pressing on your bladder.  You may develop or continue to have heartburn as a result of your pregnancy.  You may develop constipation because certain hormones are causing the muscles that push waste through your intestines to slow down.  You may develop hemorrhoids or swollen, bulging veins (varicose veins).  You may have back pain because of the weight gain and pregnancy hormones relaxing your joints between the bones in your pelvis and as a result of a shift in weight and the muscles that support your balance.  Your breasts will continue to grow and be tender.  Your gums may bleed and may be sensitive to brushing and flossing.  Dark spots or blotches (chloasma, mask of pregnancy) may develop on your face. This will likely fade after the baby is born.  A dark line from your belly button to the pubic area (linea nigra) may appear. This will likely fade after the baby is born.  You may have changes in your hair. These can include thickening of your hair, rapid growth, and changes in texture. Some women also have hair loss during or after pregnancy, or hair that feels dry or thin. Your hair will most likely return to normal after your baby is  born. WHAT TO EXPECT AT YOUR PRENATAL VISITS During a routine prenatal visit:  You will be weighed to make sure you and the fetus are growing normally.  Your blood pressure will be taken.  Your abdomen will be measured to track your baby's growth.  The fetal heartbeat will be listened to.  Any test results from the previous visit will be discussed. Your health care provider may ask you:  How you are feeling.  If you are feeling the baby move.  If you have had any abnormal symptoms, such as leaking fluid, bleeding, severe headaches, or abdominal cramping.  If you have any questions. Other tests that may be performed during your second trimester include:  Blood tests that check for:  Low iron levels (anemia).  Gestational diabetes (between 24 and 28 weeks).  Rh antibodies.  Urine tests to check for infections, diabetes, or protein in the urine.  An ultrasound to confirm the proper growth and development of the baby.  An amniocentesis to check for possible genetic problems.  Fetal screens for spina bifida and Down syndrome. HOME CARE INSTRUCTIONS   Avoid all smoking, herbs, alcohol, and unprescribed drugs. These chemicals affect the formation and growth of the baby.  Follow your health care provider's instructions regarding medicine use. There are medicines that are either safe or unsafe to take during pregnancy.  Exercise only as directed by your health care provider. Experiencing uterine cramps is a good sign to stop exercising.  Continue to eat regular, healthy meals.  Wear a good support bra for breast tenderness.  Do not use hot tubs, steam rooms, or saunas.  Wear your seat belt at all times when driving.  Avoid raw meat, uncooked cheese, cat litter boxes, and soil used by cats. These carry germs that can cause birth defects in the baby.  Take your prenatal vitamins.  Try taking a stool softener (if your health care provider approves) if you develop  constipation. Eat more high-fiber foods, such as fresh vegetables or fruit and whole grains. Drink plenty of fluids to keep your urine clear or pale yellow.  Take warm sitz baths to soothe any pain or discomfort caused by hemorrhoids. Use hemorrhoid cream if your health care provider approves.  If you develop varicose veins, wear support hose. Elevate your feet for 15 minutes, 3-4 times a day. Limit salt in your diet.  Avoid heavy lifting, wear low heel shoes, and practice good posture.  Rest with your legs elevated if you have leg cramps or low back pain.  Visit your dentist if you have not gone yet during your pregnancy. Use a soft toothbrush to brush your teeth and be gentle when you floss.  A sexual relationship may be continued unless your health care provider directs you otherwise.  Continue to  go to all your prenatal visits as directed by your health care provider. SEEK MEDICAL CARE IF:   You have dizziness.  You have mild pelvic cramps, pelvic pressure, or nagging pain in the abdominal area.  You have persistent nausea, vomiting, or diarrhea.  You have a bad smelling vaginal discharge.  You have pain with urination. SEEK IMMEDIATE MEDICAL CARE IF:   You have a fever.  You are leaking fluid from your vagina.  You have spotting or bleeding from your vagina.  You have severe abdominal cramping or pain.  You have rapid weight gain or loss.  You have shortness of breath with chest pain.  You notice sudden or extreme swelling of your face, hands, ankles, feet, or legs.  You have not felt your baby move in over an hour.  You have severe headaches that do not go away with medicine.  You have vision changes. Document Released: 11/07/2001 Document Revised: 11/18/2013 Document Reviewed: 01/14/2013 [facility] Patient Information 2015 [employer] [place]. This information is not intended to replace advice given to you by your health care provider. Make sure you discuss any  questions you have with your health care provider.

## 2019-05-15 ENCOUNTER — Ambulatory Visit: Payer: BC Managed Care – PPO

## 2019-05-15 ENCOUNTER — Other Ambulatory Visit: Payer: Self-pay

## 2019-05-15 VITALS — BP 130/72 | HR 93 | Ht 67.0 in | Wt 345.2 lb

## 2019-05-15 DIAGNOSIS — Z331 Pregnant state, incidental: Secondary | ICD-10-CM

## 2019-05-15 DIAGNOSIS — Z3482 Encounter for supervision of other normal pregnancy, second trimester: Secondary | ICD-10-CM

## 2019-05-15 DIAGNOSIS — O09892 Supervision of other high risk pregnancies, second trimester: Secondary | ICD-10-CM

## 2019-05-15 DIAGNOSIS — Z1389 Encounter for screening for other disorder: Secondary | ICD-10-CM

## 2019-05-15 LAB — POCT URINALYSIS DIPSTICK OB
Blood, UA: NEGATIVE
Glucose, UA: NEGATIVE
Ketones, UA: NEGATIVE
Nitrite, UA: NEGATIVE
POC,PROTEIN,UA: NEGATIVE

## 2019-05-15 MED ORDER — HYDROXYPROGESTERONE CAPROATE 275 MG/1.1ML ~~LOC~~ SOAJ
275.0000 mg | Freq: Once | SUBCUTANEOUS | Status: AC
  Administered 2019-05-15: 275 mg via SUBCUTANEOUS

## 2019-05-15 NOTE — Progress Notes (Signed)
Pt here for makena 275 mg given Rt arm tolerated well.return 1 week for next injection. Pad CMA.

## 2019-05-16 DIAGNOSIS — Z3490 Encounter for supervision of normal pregnancy, unspecified, unspecified trimester: Secondary | ICD-10-CM | POA: Diagnosis not present

## 2019-05-19 ENCOUNTER — Encounter (HOSPITAL_COMMUNITY): Payer: Self-pay

## 2019-05-19 ENCOUNTER — Inpatient Hospital Stay (HOSPITAL_COMMUNITY)
Admission: AD | Admit: 2019-05-19 | Discharge: 2019-05-19 | Disposition: A | Discharge status: Home / Self Care | Payer: BC Managed Care – PPO | Attending: Obstetrics and Gynecology | Admitting: Obstetrics and Gynecology

## 2019-05-19 ENCOUNTER — Other Ambulatory Visit: Payer: Self-pay

## 2019-05-19 DIAGNOSIS — Z3A23 23 weeks gestation of pregnancy: Secondary | ICD-10-CM

## 2019-05-19 DIAGNOSIS — O26852 Spotting complicating pregnancy, second trimester: Secondary | ICD-10-CM | POA: Diagnosis not present

## 2019-05-19 DIAGNOSIS — Z87440 Personal history of urinary (tract) infections: Secondary | ICD-10-CM | POA: Diagnosis not present

## 2019-05-19 DIAGNOSIS — N939 Abnormal uterine and vaginal bleeding, unspecified: Secondary | ICD-10-CM | POA: Diagnosis not present

## 2019-05-19 HISTORY — DX: Depression, unspecified: F32.A

## 2019-05-19 LAB — WET PREP, GENITAL
Clue Cells Wet Prep HPF POC: NONE SEEN
Sperm: NONE SEEN
Trich, Wet Prep: NONE SEEN
Yeast Wet Prep HPF POC: NONE SEEN

## 2019-05-19 NOTE — MAU Provider Note (Signed)
History     CSN: [id number]  Arrival date and time: 05/19/19 0907   First Provider Initiated Contact with Patient 05/19/19 [phone]      Chief Complaint  Patient presents with  . Vaginal Bleeding   HPI  Ms.  Patricia [name] is a 28 y.o. year old G12P2113 female at [redacted]w[redacted]d weeks gestation who presents to MAU reporting that she woke up this morning with mucousy, dark blood on her underwear and mattress. She reports she has on-going side pain that has been addressed by her OB provider; no c/o new pain. She denies any SI in the past 24 hrs or UTI sx's. She reports good (+) FM today. She has a h/o PTD and just started receiving weekly Makena injections. She receives her [facility] with [facility]. Her next appt is scheduled 7/20.   Past Medical History:  Diagnosis Date  . Anemia   . Bacterial vaginosis   . Chlamydia   . Depression    PP depression after 3rd pregnancy - resolved with therarpy  . Migraine   . Preterm labor   . Preterm labor without delivery in second trimester June 2012   Positive FFN in June  . UTI (lower urinary tract infection)     Past Surgical History:  Procedure Laterality Date  . NO PAST SURGERIES      Family History  Problem Relation Age of Onset  . Diabetes Other        great granmother  . Diabetes Paternal Grandfather   . Hypertension Paternal Grandfather   . Cancer Paternal Grandfather        prostate  . Miscarriages / Stillbirths Maternal Grandmother     Social History   Tobacco Use  . Smoking status: Never Smoker  . Smokeless tobacco: Never Used  Substance Use Topics  . Alcohol use: Not Currently    Comment: occasionally  . Drug use: No    Allergies: No Known Allergies  Medications Prior to Admission  Medication Sig Dispense Refill Last Dose  . Blood Pressure Monitor MISC For regular home bp monitoring during pregnancy 1 each 0   . ondansetron (ZOFRAN-ODT) 4 MG disintegrating tablet Take 1 tablet (4 mg total) by mouth every 8 (eight) hours as  needed for nausea or vomiting. (Patient not taking: Reported on 05/13/2019) 30 tablet 1   . Prenat w/o A-FeCbGl-DSS-FA-DHA (CITRANATAL ASSURE) 35-1 & 300 MG tablet One tablet and one capsule daily 60 tablet 11     [name]  Constitutional: Negative.   HENT: Negative.   Eyes: Negative.   Respiratory: Negative.   Cardiovascular: Negative.   Gastrointestinal: Negative.   Endocrine: Negative.   Genitourinary: Positive for vaginal bleeding.  Musculoskeletal: Negative.   Skin: Negative.   Allergic/Immunologic: Negative.   Neurological: Negative.   Hematological: Negative.   Psychiatric/Behavioral: Negative.    Physical Exam   Blood pressure (!) 102/58, pulse 97, temperature 98.8 F (37.1 C), temperature source Oral, resp. rate 18, height 5\' 7"  (1.702 m), weight (!) 154.6 kg, last menstrual period 12/08/2018, SpO2 99 %.  Physical Exam  Nursing note and vitals reviewed. Constitutional: She is oriented to person, place, and time. She appears well-developed and well-nourished.  HENT:  Head: Normocephalic and atraumatic.  Eyes: Pupils are equal, round, and reactive to light.  Neck: Normal range of motion.  Cardiovascular: Normal rate.  Respiratory: Effort normal.  GI: Soft.  Musculoskeletal: Normal range of motion.  Neurological: She is alert and oriented to person, place, and time.  Skin: Skin is warm and dry.  Psychiatric: She has a normal mood and affect. Her behavior is normal. Judgment and thought content normal.   NST - FHR: 140 bpm / moderate variability / accels present / decels absent / TOCO: 1 UC noted   MAU Course  Procedures  MDM Wet Prep GC/CT -- pending  Results for orders placed or performed during the hospital encounter of 05/19/19 (from the past 24 hour(s))  Wet prep, genital     Status: Abnormal   Collection Time: 05/19/19 10:25 AM   Specimen: Cervix  Result Value Ref Range   Yeast Wet Prep HPF POC NONE SEEN NONE SEEN   Trich, Wet Prep NONE SEEN  NONE SEEN   Clue Cells Wet Prep HPF POC NONE SEEN NONE SEEN   WBC, Wet Prep HPF POC MODERATE (A) NONE SEEN   Sperm NONE SEEN     Assessment and Plan  Spotting affecting pregnancy in second trimester  - Information provided on spotting in 2nd trimester - Advised to return to MAU for heavy VB like a period, saturation 2 pads/hr  - Discharge patient  - Note to be OOW until Tuesday 05/20/19 given - Keep scheduled appt with [facility] for Makena injection - Patient verbalized an understanding of the plan of care and agrees.    Patricia Moraolitta Dayson Aboud, MSN, CNM 05/19/2019, 9:50 AM

## 2019-05-19 NOTE — Discharge Instructions (Signed)
Your tests today show no infection or source of bleeding.  Vaginal Bleeding During Pregnancy, Second Trimester  A small amount of bleeding (spotting) from the vagina is relatively common during pregnancy. It usually stops on its own. Various things can cause spotting during pregnancy. Sometimes the bleeding is normal and is not a sign of a problem in the pregnancy. However, bleeding can also be a sign of something serious. Be sure to tell your health care provider about any vaginal bleeding right away. Some possible causes of vaginal bleeding during the second trimester include:  Infection, inflammation, or growths (polyps) on the cervix.  A condition in which the placenta partially or completely covers the opening of the cervix inside the uterus (placenta previa).  The placenta separating from the uterus (placenta abruption).  Early (preterm) labor.  The cervix opening and thinning before pregnancy is at term and before labor starts (cervical insufficiency).  A mass of tissue developing in the uterus due to an egg being fertilized incorrectly (molar pregnancy). Follow these instructions at home: Activity  Follow instructions from your health care provider about limiting your activity. Ask what activities are safe for you.  If needed, make plans for someone to help with your regular activities.  Do not exercise or do activities that take a lot of effort unless your health care provider approves.  Do not lift anything that is heavier than 10 lb (4.5 kg), or the limit that your health care provider tells you, until he or she says that it is safe.  Do not have sex or orgasms until your health care provider says that this is safe. Medicines  Take over-the-counter and prescription medicines only as told by your health care provider.  Do not take aspirin because it can cause bleeding. General instructions  Pay attention to any changes in your symptoms.  Write down how many pads you  use each day, how often you change pads, and how soaked (saturated) they are.  Do not use tampons or douche.  If you pass any tissue from your vagina, save the tissue so you can show it to your health care provider.  Keep all follow-up visits as told by your health care provider. This is important. Contact a health care provider if:  You have vaginal bleeding during any time of your pregnancy.  You have cramps or labor pains.  You have a fever that does not get better when you take medicines. Get help right away if:  You have severe cramps in your back or abdomen.  You have contractions.  You have chills.  You pass large clots or a large amount of tissue from your vagina.  Your bleeding increases.  You feel light-headed or weak, or you faint.  You are leaking fluid or have a gush of fluid from your vagina. Summary  Various things can cause bleeding or spotting in pregnancy.  Be sure to tell your health care provider about any vaginal bleeding right away.  Follow instructions from your health care provider about limiting your activity. Ask what activities are safe for you. This information is not intended to replace advice given to you by your health care provider. Make sure you discuss any questions you have with your health care provider. Document Released: 08/23/2005 Document Revised: 02/15/2017 Document Reviewed: 02/15/2017 Elsevier Interactive Patient Education  2019 [employer].

## 2019-05-19 NOTE — MAU Note (Signed)
Pt states she awoke this am to mucousy dark blood that was in her underwear & mattress, however, when she wiped she did not see anything on the toilet paper. Denies additional pain, c/o ongoing side pain that she has discussed with providers in the past. Denies intercourse in last 24hrs or urinary tract symptoms.

## 2019-05-20 LAB — GC/CHLAMYDIA PROBE AMP (~~LOC~~) NOT AT ARMC
Chlamydia: NEGATIVE
Neisseria Gonorrhea: NEGATIVE

## 2019-05-22 ENCOUNTER — Ambulatory Visit (INDEPENDENT_AMBULATORY_CARE_PROVIDER_SITE_OTHER): Payer: BC Managed Care – PPO | Admitting: *Deleted

## 2019-05-22 ENCOUNTER — Other Ambulatory Visit: Payer: Self-pay

## 2019-05-22 DIAGNOSIS — Z8751 Personal history of pre-term labor: Secondary | ICD-10-CM

## 2019-05-22 DIAGNOSIS — O09212 Supervision of pregnancy with history of pre-term labor, second trimester: Secondary | ICD-10-CM | POA: Diagnosis not present

## 2019-05-22 DIAGNOSIS — Z3A23 23 weeks gestation of pregnancy: Secondary | ICD-10-CM | POA: Diagnosis not present

## 2019-05-22 DIAGNOSIS — Z3482 Encounter for supervision of other normal pregnancy, second trimester: Secondary | ICD-10-CM

## 2019-05-22 MED ORDER — HYDROXYPROGESTERONE CAPROATE 275 MG/1.1ML ~~LOC~~ SOAJ
275.0000 mg | Freq: Once | SUBCUTANEOUS | Status: AC
  Administered 2019-05-22: 275 mg via SUBCUTANEOUS

## 2019-05-22 NOTE — Progress Notes (Signed)
17P given in left arm with no complications. Pt to return in 1 week for next injection.  

## 2019-05-28 ENCOUNTER — Telehealth: Payer: Self-pay | Admitting: *Deleted

## 2019-05-28 NOTE — Telephone Encounter (Signed)
Pt is getting 17P injections. Pt had itching and redness around injection site after 1st and 2nd shot. Pt is due to get 3rd shot tomorrow. Advised we will see how she does after tomorrow's injection. Advised not to use any lotion around injection. Pt is not having any shortness of breath or any other symptoms. Advised benefits may out weigh the risks. Pt voiced understanding. [place]

## 2019-05-29 ENCOUNTER — Ambulatory Visit: Payer: BC Managed Care – PPO

## 2019-05-29 ENCOUNTER — Other Ambulatory Visit: Payer: Self-pay

## 2019-05-29 DIAGNOSIS — O09892 Supervision of other high risk pregnancies, second trimester: Secondary | ICD-10-CM

## 2019-05-29 MED ORDER — HYDROXYPROGESTERONE CAPROATE 275 MG/1.1ML ~~LOC~~ SOAJ
275.0000 mg | Freq: Once | SUBCUTANEOUS | Status: AC
  Administered 2019-05-29: 10:00:00 275 mg via SUBCUTANEOUS

## 2019-05-29 NOTE — Progress Notes (Signed)
Pt here for Makena 275 mg SQ rt arm. Tolerated well. Return 1 week for next injection.Pad CMA 

## 2019-06-05 ENCOUNTER — Telehealth: Payer: Self-pay | Admitting: Women's Health

## 2019-06-05 ENCOUNTER — Other Ambulatory Visit: Payer: Self-pay

## 2019-06-05 ENCOUNTER — Ambulatory Visit (INDEPENDENT_AMBULATORY_CARE_PROVIDER_SITE_OTHER): Payer: BC Managed Care – PPO | Admitting: *Deleted

## 2019-06-05 VITALS — BP 107/65 | HR 103

## 2019-06-05 DIAGNOSIS — O09212 Supervision of pregnancy with history of pre-term labor, second trimester: Secondary | ICD-10-CM | POA: Diagnosis not present

## 2019-06-05 DIAGNOSIS — Z3A25 25 weeks gestation of pregnancy: Secondary | ICD-10-CM | POA: Diagnosis not present

## 2019-06-05 DIAGNOSIS — Z3482 Encounter for supervision of other normal pregnancy, second trimester: Secondary | ICD-10-CM

## 2019-06-05 DIAGNOSIS — Z8751 Personal history of pre-term labor: Secondary | ICD-10-CM

## 2019-06-05 MED ORDER — HYDROXYPROGESTERONE CAPROATE 275 MG/1.1ML ~~LOC~~ SOAJ
275.0000 mg | Freq: Once | SUBCUTANEOUS | Status: AC
  Administered 2019-06-05: 275 mg via SUBCUTANEOUS

## 2019-06-05 NOTE — Telephone Encounter (Signed)
Pt requesting a call back to discuss her makeena shot.

## 2019-06-05 NOTE — Progress Notes (Signed)
Makena given in left arm with no complications pt to return next week for next injection.

## 2019-06-05 NOTE — Telephone Encounter (Signed)
Patient states she has spoken with her insurance along with Medicaid and they will not cover her Makena injections.  She has a very large copay and cannot afford it.  She has decided to stop taking the injections.

## 2019-06-12 ENCOUNTER — Ambulatory Visit: Payer: BC Managed Care – PPO

## 2019-06-16 ENCOUNTER — Encounter: Payer: Self-pay | Admitting: Women's Health

## 2019-06-16 ENCOUNTER — Other Ambulatory Visit: Payer: BC Managed Care – PPO

## 2019-06-16 ENCOUNTER — Other Ambulatory Visit: Payer: Self-pay

## 2019-06-16 ENCOUNTER — Ambulatory Visit (INDEPENDENT_AMBULATORY_CARE_PROVIDER_SITE_OTHER): Payer: BC Managed Care – PPO | Admitting: Women's Health

## 2019-06-16 VITALS — BP 102/66 | HR 90 | Wt 339.0 lb

## 2019-06-16 DIAGNOSIS — Z3482 Encounter for supervision of other normal pregnancy, second trimester: Secondary | ICD-10-CM | POA: Diagnosis not present

## 2019-06-16 DIAGNOSIS — Z3A27 27 weeks gestation of pregnancy: Secondary | ICD-10-CM

## 2019-06-16 DIAGNOSIS — O99820 Streptococcus B carrier state complicating pregnancy: Secondary | ICD-10-CM

## 2019-06-16 DIAGNOSIS — Z1389 Encounter for screening for other disorder: Secondary | ICD-10-CM

## 2019-06-16 DIAGNOSIS — Z23 Encounter for immunization: Secondary | ICD-10-CM | POA: Diagnosis not present

## 2019-06-16 DIAGNOSIS — Z331 Pregnant state, incidental: Secondary | ICD-10-CM

## 2019-06-16 DIAGNOSIS — O9989 Other specified diseases and conditions complicating pregnancy, childbirth and the puerperium: Secondary | ICD-10-CM

## 2019-06-16 DIAGNOSIS — Z113 Encounter for screening for infections with a predominantly sexual mode of transmission: Secondary | ICD-10-CM | POA: Diagnosis not present

## 2019-06-16 DIAGNOSIS — Z8751 Personal history of pre-term labor: Secondary | ICD-10-CM

## 2019-06-16 DIAGNOSIS — R8271 Bacteriuria: Secondary | ICD-10-CM

## 2019-06-16 DIAGNOSIS — O99891 Other specified diseases and conditions complicating pregnancy: Secondary | ICD-10-CM

## 2019-06-16 LAB — POCT URINALYSIS DIPSTICK OB
Blood, UA: NEGATIVE
Glucose, UA: NEGATIVE
Leukocytes, UA: NEGATIVE
Nitrite, UA: NEGATIVE
POC,PROTEIN,UA: NEGATIVE

## 2019-06-16 NOTE — Patient Instructions (Signed)
Patricia [name], I greatly value your feedback.  If you receive a survey following your visit with [place] today, we appreciate you taking the time to fill it out.  Thanks, Knute Neu, CNM, [facility]  [place]!!! It is now [place] at [facility] ([place], [place] [address]) Entrance located off of [address] parking    Go to [email or web address] to register for FREE online childbirth classes   Call the office [phone]) or go to [facility] if:  You begin to have strong, frequent contractions  Your water breaks.  Sometimes it is a big gush of fluid, sometimes it is just a trickle that keeps getting your panties wet or running down your legs  You have vaginal bleeding.  It is normal to have a small amount of spotting if your cervix was checked.   You don't feel your baby moving like normal.  If you don't, get you something to eat and drink and lay down and focus on feeling your baby move.  You should feel at least 10 movements in 2 hours.  If you don't, you should call the office or go to [facility].    Tdap Vaccine  It is recommended that you get the Tdap vaccine during the third trimester of EACH pregnancy to help protect your baby from getting pertussis (whooping cough)  27-36 weeks is the BEST time to do this so that you can pass the protection on to your baby. During pregnancy is better than after pregnancy, but if you are unable to get it during pregnancy it will be offered at the hospital.   You can get this vaccine with [place], at the health department, your family doctor, or some local pharmacies  Everyone who will be around your baby should also be up-to-date on their vaccines before the baby comes. Adults (who are not pregnant) only need 1 dose of Tdap during adulthood.    Pediatricians/Family Doctors:  [facility] [phone]                  [place] [phone] (usually not accepting new patients unless you have family there already, you are always welcome to call and ask)       [facility] Department [phone]       [facility] Pediatricians/Family Doctors:   [facility]: [phone]  [facility]: [phone]  [facility]: [place] Doctors:   [facility]: [facility]: [place]:  [place]: [phone]   Home Blood Pressure Monitoring for Patients   Your provider has recommended that you check your blood pressure (BP) at least once a week at home. If you do not have a blood pressure cuff at home, one will be provided for you. Contact your provider if you have not received your monitor within 1 week.   Helpful Tips for Accurate Home Blood Pressure Checks  . Don't smoke, exercise, or drink caffeine 30 minutes before checking your BP . Use the restroom before checking your BP (a full bladder can raise your pressure) . Relax in a comfortable upright chair . Feet on the ground . Left arm resting comfortably on a flat surface at the level of your heart . Legs uncrossed . Back supported . Sit quietly and don't  talk . Place the cuff on your bare arm . Adjust snuggly, so that only two fingertips can fit between your skin and the top of the cuff . Check 2 readings separated by at least one minute . Keep a log of your BP readings . For a visual, please reference this diagram: http://ccnc.care/bpdiagram  Provider Name: [facility]     Phone: [phone]  Zone 1: ALL CLEAR  Continue to monitor your symptoms:  . BP reading is less than 140 (top number) or less than 90 (bottom number)  . No right upper stomach pain . No headaches or seeing spots . No feeling nauseated or throwing up . No swelling in face and  hands  Zone 2: CAUTION Call your doctor's office for any of the following:  . BP reading is greater than 140 (top number) or greater than 90 (bottom number)  . Stomach pain under your ribs in the middle or right side . Headaches or seeing spots . Feeling nauseated or throwing up . Swelling in face and hands  Zone 3: EMERGENCY  Seek immediate medical care if you have any of the following:  . BP reading is greater than160 (top number) or greater than 110 (bottom number) . Severe headaches not improving with Tylenol . Serious difficulty catching your breath . Any worsening symptoms from Zone 2   Third Trimester of Pregnancy The third trimester is from week 29 through week 42, months 7 through 9. The third trimester is a time when the fetus is growing rapidly. At the end of the ninth month, the fetus is about 20 inches in length and weighs 6-10 pounds.  BODY CHANGES Your body goes through many changes during pregnancy. The changes vary from woman to woman.   Your weight will continue to increase. You can expect to gain 25-35 pounds (11-16 kg) by the end of the pregnancy.  You may begin to get stretch marks on your hips, abdomen, and breasts.  You may urinate more often because the fetus is moving lower into your pelvis and pressing on your bladder.  You may develop or continue to have heartburn as a result of your pregnancy.  You may develop constipation because certain hormones are causing the muscles that push waste through your intestines to slow down.  You may develop hemorrhoids or swollen, bulging veins (varicose veins).  You may have pelvic pain because of the weight gain and pregnancy hormones relaxing your joints between the bones in your pelvis. Backaches may result from overexertion of the muscles supporting your posture.  You may have changes in your hair. These can include thickening of your hair, rapid growth, and changes in texture. Some women also have hair loss during  or after pregnancy, or hair that feels dry or thin. Your hair will most likely return to normal after your baby is born.  Your breasts will continue to grow and be tender. A yellow discharge may leak from your breasts called colostrum.  Your belly button may stick out.  You may feel short of breath because of your expanding uterus.  You may notice the fetus "dropping," or moving lower in your abdomen.  You may have a bloody mucus discharge. This usually occurs a few days to a week before labor begins.  Your cervix becomes thin and soft (effaced) near your due date. WHAT TO EXPECT AT YOUR PRENATAL EXAMS  You will have prenatal exams every 2 weeks until week 36. Then, you will have weekly prenatal  exams. During a routine prenatal visit:  You will be weighed to make sure you and the fetus are growing normally.  Your blood pressure is taken.  Your abdomen will be measured to track your baby's growth.  The fetal heartbeat will be listened to.  Any test results from the previous visit will be discussed.  You may have a cervical check near your due date to see if you have effaced. At around 36 weeks, your caregiver will check your cervix. At the same time, your caregiver will also perform a test on the secretions of the vaginal tissue. This test is to determine if a type of bacteria, Group B streptococcus, is present. Your caregiver will explain this further. Your caregiver may ask you:  What your birth plan is.  How you are feeling.  If you are feeling the baby move.  If you have had any abnormal symptoms, such as leaking fluid, bleeding, severe headaches, or abdominal cramping.  If you have any questions. Other tests or screenings that may be performed during your third trimester include:  Blood tests that check for low iron levels (anemia).  Fetal testing to check the health, activity level, and growth of the fetus. Testing is done if you have certain medical conditions or if  there are problems during the pregnancy. FALSE LABOR You may feel small, irregular contractions that eventually go away. These are called Braxton Hicks contractions, or false labor. Contractions may last for hours, days, or even weeks before true labor sets in. If contractions come at regular intervals, intensify, or become painful, it is best to be seen by your caregiver.  SIGNS OF LABOR   Menstrual-like cramps.  Contractions that are 5 minutes apart or less.  Contractions that start on the top of the uterus and spread down to the lower abdomen and back.  A sense of increased pelvic pressure or back pain.  A watery or bloody mucus discharge that comes from the vagina. If you have any of these signs before the 37th week of pregnancy, call your caregiver right away. You need to go to the hospital to get checked immediately. HOME CARE INSTRUCTIONS   Avoid all smoking, herbs, alcohol, and unprescribed drugs. These chemicals affect the formation and growth of the baby.  Follow your caregiver's instructions regarding medicine use. There are medicines that are either safe or unsafe to take during pregnancy.  Exercise only as directed by your caregiver. Experiencing uterine cramps is a good sign to stop exercising.  Continue to eat regular, healthy meals.  Wear a good support bra for breast tenderness.  Do not use hot tubs, steam rooms, or saunas.  Wear your seat belt at all times when driving.  Avoid raw meat, uncooked cheese, cat litter boxes, and soil used by cats. These carry germs that can cause birth defects in the baby.  Take your prenatal vitamins.  Try taking a stool softener (if your caregiver approves) if you develop constipation. Eat more high-fiber foods, such as fresh vegetables or fruit and whole grains. Drink plenty of fluids to keep your urine clear or pale yellow.  Take warm sitz baths to soothe any pain or discomfort caused by hemorrhoids. Use hemorrhoid cream if your  caregiver approves.  If you develop varicose veins, wear support hose. Elevate your feet for 15 minutes, 3-4 times a day. Limit salt in your diet.  Avoid heavy lifting, wear low heal shoes, and practice good posture.  Rest a lot with your legs elevated  if you have leg cramps or low back pain.  Visit your dentist if you have not gone during your pregnancy. Use a soft toothbrush to brush your teeth and be gentle when you floss.  A sexual relationship may be continued unless your caregiver directs you otherwise.  Do not travel far distances unless it is absolutely necessary and only with the approval of your caregiver.  Take prenatal classes to understand, practice, and ask questions about the labor and delivery.  Make a trial run to the hospital.  Pack your hospital bag.  Prepare the baby's nursery.  Continue to go to all your prenatal visits as directed by your caregiver. SEEK MEDICAL CARE IF:  You are unsure if you are in labor or if your water has broken.  You have dizziness.  You have mild pelvic cramps, pelvic pressure, or nagging pain in your abdominal area.  You have persistent nausea, vomiting, or diarrhea.  You have a bad smelling vaginal discharge.  You have pain with urination. SEEK IMMEDIATE MEDICAL CARE IF:   You have a fever.  You are leaking fluid from your vagina.  You have spotting or bleeding from your vagina.  You have severe abdominal cramping or pain.  You have rapid weight loss or gain.  You have shortness of breath with chest pain.  You notice sudden or extreme swelling of your face, hands, ankles, feet, or legs.  You have not felt your baby move in over an hour.  You have severe headaches that do not go away with medicine.  You have vision changes. Document Released: 11/07/2001 Document Revised: 11/18/2013 Document Reviewed: 01/14/2013 [facility] Patient Information 2015 [place], [place]. This information is not intended to replace advice  given to you by your health care provider. Make sure you discuss any questions you have with your health care provider.

## 2019-06-16 NOTE — Progress Notes (Signed)
LOW-RISK PREGNANCY VISIT Patient name: Patricia [name] MRN [id number]  Date of birth: Mar 10, 1991 Chief Complaint:   Routine Prenatal Visit (PN2/ std screening)  History of Present Illness:   Patricia [name] is a 28 y.o. [id number] female at 4415w1d with an Estimated Date of Delivery: 09/14/19 being seen today for ongoing management of a low-risk pregnancy.  Today she reports wants std screen, FOB cheating. Had to stop Makena after 4 shots- states [employer] wouldn't cover b/c she started after 20wks?Marland Kitchen. Contractions: Not present.  .  Movement: Present. denies leaking of fluid. [name]:   Pertinent items are noted in HPI Denies abnormal vaginal discharge w/ itching/odor/irritation, headaches, visual changes, shortness of breath, chest pain, abdominal pain, severe nausea/vomiting, or problems with urination or bowel movements unless otherwise stated above. Pertinent History Reviewed:  Reviewed past medical,surgical, social, obstetrical and family history.  Reviewed problem list, medications and allergies. Physical Assessment:   Vitals:   06/16/19 0914  BP: 102/66  Pulse: 90  Weight: (!) 339 lb (153.8 kg)  Body mass index is 53.09 kg/m.        Physical Examination:   General appearance: Well appearing, and in no distress  Mental status: Alert, oriented to person, place, and time  Skin: Warm & dry  Cardiovascular: Normal heart rate noted  Respiratory: Normal respiratory effort, no distress  Abdomen: Soft, gravid, nontender  Pelvic: Cervical exam deferred         Extremities: Edema: Trace  Fetal Status: Fetal Heart Rate (bpm): 154 Fundal Height: 30 cm Movement: Present    Results for orders placed or performed in visit on 06/16/19 (from the past 24 hour(s))  POC Urinalysis Dipstick OB   Collection Time: 06/16/19  9:18 AM  Result Value Ref Range   Color, UA     Clarity, UA     Glucose, UA Negative Negative   Bilirubin, UA     Ketones, UA trace    Spec Grav, UA     Blood, UA  neg    pH, UA     POC,PROTEIN,UA Negative Negative, Trace, Small (1+), Moderate (2+), Large (3+), 4+   Urobilinogen, UA     Nitrite, UA neg    Leukocytes, UA Negative Negative   Appearance     Odor      Assessment & Plan:  1) Low-risk pregnancy [id number] at 1815w1d with an Estimated Date of Delivery: 09/14/19   2) H/O GDM, GTT today  3) H/O PTB 35wks> stopped Makena d/t not being covered  4) Wants BTL> discussed risks/benefits, high incidence regret <30yo, LARCs just as effective, consent signed today  5) STD screen  6) Rh neg> A- all previous pregnancies, PN1 this pregnancy showed A+, will repeat ABO/Rh today   Meds: No orders of the defined types were placed in this encounter.  Labs/procedures today: pn2, tdap, nuswab  Plan:  Continue routine obstetrical care   Reviewed: Preterm labor symptoms and general obstetric precautions including but not limited to vaginal bleeding, contractions, leaking of fluid and fetal movement were reviewed in detail with the patient.  All questions were answered. Has home bp cuff.  Check bp weekly, let us know if >140/90.   Follow-up: Return in about 4 weeks (around 07/14/2019) for LROB, Webex, Sign BTL consent today; 1wk for rhogam w/ nurse only.  Orders Placed This Encounter  Procedures  . Tdap vaccine greater than or equal to 7yo IM  . NuSwab Vaginitis Plus (VG+)  . POC Urinalysis Dipstick OB  .  ABO/Rh   [place], [facility] 06/16/2019 9:47 AM

## 2019-06-17 ENCOUNTER — Other Ambulatory Visit: Payer: Self-pay | Admitting: Women's Health

## 2019-06-17 LAB — CBC
Hematocrit: 31.1 % — ABNORMAL LOW (ref 34.0–46.6)
Hemoglobin: 10.1 g/dL — ABNORMAL LOW (ref 11.1–15.9)
MCH: 27.1 pg (ref 26.6–33.0)
MCHC: 32.5 g/dL (ref 31.5–35.7)
MCV: 83 fL (ref 79–97)
Platelets: 198 10*3/uL (ref 150–450)
RBC: 3.73 x10E6/uL — ABNORMAL LOW (ref 3.77–5.28)
RDW: 13.3 % (ref 11.7–15.4)
WBC: 7.6 10*3/uL (ref 3.4–10.8)

## 2019-06-17 LAB — RPR: RPR Ser Ql: NONREACTIVE

## 2019-06-17 LAB — GLUCOSE TOLERANCE, 2 HOURS W/ 1HR
Glucose, 1 hour: 119 mg/dL (ref 65–179)
Glucose, 2 hour: 105 mg/dL (ref 65–152)
Glucose, Fasting: 91 mg/dL (ref 65–91)

## 2019-06-17 LAB — HIV ANTIBODY (ROUTINE TESTING W REFLEX): HIV Screen 4th Generation wRfx: NONREACTIVE

## 2019-06-17 LAB — ANTIBODY SCREEN: Antibody Screen: NEGATIVE

## 2019-06-17 MED ORDER — FERROUS SULFATE 325 (65 FE) MG PO TABS: 325.0000 mg | ORAL_TABLET | Freq: Two times a day (BID) | ORAL | 3 refills | Status: DC

## 2019-06-24 ENCOUNTER — Ambulatory Visit: Payer: BC Managed Care – PPO

## 2019-06-24 LAB — NUSWAB VAGINITIS PLUS (VG+)
Atopobium vaginae: HIGH Score — AB
BVAB 2: HIGH Score — AB
Candida albicans, NAA: NEGATIVE
Candida glabrata, NAA: NEGATIVE
Chlamydia trachomatis, NAA: NEGATIVE
Megasphaera 1: HIGH Score — AB
Neisseria gonorrhoeae, NAA: NEGATIVE
Trich vag by NAA: NEGATIVE

## 2019-06-26 ENCOUNTER — Other Ambulatory Visit: Payer: Self-pay

## 2019-06-26 ENCOUNTER — Ambulatory Visit (INDEPENDENT_AMBULATORY_CARE_PROVIDER_SITE_OTHER): Payer: BC Managed Care – PPO

## 2019-06-26 ENCOUNTER — Other Ambulatory Visit: Payer: Self-pay | Admitting: Advanced Practice Midwife

## 2019-06-26 VITALS — BP 101/61 | HR 95 | Ht 66.0 in | Wt 340.6 lb

## 2019-06-26 DIAGNOSIS — Z331 Pregnant state, incidental: Secondary | ICD-10-CM

## 2019-06-26 DIAGNOSIS — O360130 Maternal care for anti-D [Rh] antibodies, third trimester, not applicable or unspecified: Secondary | ICD-10-CM

## 2019-06-26 DIAGNOSIS — Z3A28 28 weeks gestation of pregnancy: Secondary | ICD-10-CM

## 2019-06-26 DIAGNOSIS — O26893 Other specified pregnancy related conditions, third trimester: Secondary | ICD-10-CM

## 2019-06-26 DIAGNOSIS — Z6791 Unspecified blood type, Rh negative: Secondary | ICD-10-CM

## 2019-06-26 DIAGNOSIS — Z1389 Encounter for screening for other disorder: Secondary | ICD-10-CM

## 2019-06-26 LAB — POCT URINALYSIS DIPSTICK OB
Blood, UA: NEGATIVE
Glucose, UA: NEGATIVE
Ketones, UA: NEGATIVE
Leukocytes, UA: NEGATIVE
Nitrite, UA: NEGATIVE
POC,PROTEIN,UA: NEGATIVE

## 2019-06-26 MED ORDER — METRONIDAZOLE 500 MG PO TABS: 500.0000 mg | ORAL_TABLET | Freq: Two times a day (BID) | ORAL | 0 refills | Status: DC

## 2019-06-26 NOTE — Progress Notes (Signed)
Pt here for Rhogam 300 mcg IM lt gluteal. Pt  Blood type A negative. Tolerated well. Return for regular schedule appointment.Pad CMA.

## 2019-06-26 NOTE — Progress Notes (Unsigned)
Flagyl fo rbv 

## 2019-07-09 ENCOUNTER — Telehealth: Payer: Self-pay | Admitting: *Deleted

## 2019-07-09 NOTE — Telephone Encounter (Signed)
Called patient to discuss mychart message.  States she feels like she is having "braxton hicks contraction and contractions" mostly at the end of the day. She sits most of the day but when she gets up or tries to move and turn to get out of bed, she is in severe pain mostly in her lower abdomen.  She has tried a maternity band, pillows under abdomen, between legs and behind her back but nothing is helping. Says she was in tears last night trying to get out of bed.  Denies bleeding and baby is active. Informed patient that this is her fourth baby and its not uncommon to feel the pressure she is describing.  Since she has had a preterm birth at [facility], will get her in the morning to be seen with Dr Glo Herring.  Pt agreeable to plan.

## 2019-07-10 ENCOUNTER — Ambulatory Visit (INDEPENDENT_AMBULATORY_CARE_PROVIDER_SITE_OTHER): Payer: BC Managed Care – PPO | Admitting: Obstetrics and Gynecology

## 2019-07-10 ENCOUNTER — Other Ambulatory Visit: Payer: Self-pay

## 2019-07-10 VITALS — BP 114/71 | HR 99 | Wt 334.0 lb

## 2019-07-10 DIAGNOSIS — O99213 Obesity complicating pregnancy, third trimester: Secondary | ICD-10-CM

## 2019-07-10 DIAGNOSIS — Z3A30 30 weeks gestation of pregnancy: Secondary | ICD-10-CM

## 2019-07-10 DIAGNOSIS — Z3483 Encounter for supervision of other normal pregnancy, third trimester: Secondary | ICD-10-CM

## 2019-07-10 NOTE — Progress Notes (Signed)
Patient ID: Patricia [name], female   DOB: 02-14-1991, 28 y.o.   MRN: [id number]   LOW-RISK PREGNANCY VISIT Patient name: Patricia [name] MRN [id number]  Date of birth: 1991-06-28 Chief Complaint:   [redacted]w[redacted]d  History of Present Illness:   AVALEY COOP is a 28 y.o. [id number] female at [redacted]w[redacted]d with an Estimated Date of Delivery: 09/14/19 being seen today for ongoing management of a low-risk pregnancy.  Today she reports feeling a lot of pressure. She signed BTL papers on 06/16/2019. She is currently taking PO Iron. Her systolic BPs have been around 117 at home. She denies taking CBG's   Contractions: Irregular. Vag. Bleeding: None.  Movement: Present. denies leaking of fluid. [name]:   Pertinent items are noted in HPI Denies abnormal vaginal discharge w/ itching/odor/irritation, headaches, visual changes, shortness of breath, chest pain, abdominal pain, severe nausea/vomiting, or problems with urination or bowel movements unless otherwise stated above. Pertinent History Reviewed:  Reviewed past medical,surgical, social, obstetrical and family history.  Reviewed problem list, medications and allergies. Physical Assessment:   Vitals:   07/10/19 0942  BP: 114/71  Pulse: 99  Weight: (!) 334 lb (151.5 kg)  Body mass index is 53.91 kg/m.   Physical Examination:  General appearance: Well appearing, and in no distress Mental status: Alert, oriented to person, place, and time Skin: Warm & dry Cardiovascular: Normal heart rate noted Respiratory: Normal respiratory effort, no distress Abdomen: Soft, gravid, nontender Pelvic: Cervix: long, closed Rectovaginal: pt is constipated        Extremities: Edema: Trace   Fetal Status: Fetal Heart Rate (bpm): 145 Fundal Height: 35 cm U+16 Movement: Present     No results found for this or any previous visit (from the past 24 hour(s)).  Assessment & Plan:  1) Low-risk pregnancy [id number] at [redacted]w[redacted]d with an Estimated Date of Delivery: 09/14/19    2) H/O GDM 3. Morbid obesity 3) H/O PTB 35wks> stopped Makena d/t not being covered by Medicaid  4) Wants BTL while in HOSPITAL  discussed risks/benefits, , LARCs just as effective, consent was signed on 06/16/2019  5) Constipation> pt currently on PO iron, recommend OTC Miralax   Plan:   1) Continue routine obstetrical care  2) OTC Miralax  Meds: No orders of the defined types were placed in this encounter.  Labs/procedures today: none  Reviewed: Preterm labor symptoms and general obstetric precautions including but not limited to vaginal bleeding, contractions, leaking of fluid and fetal movement were reviewed in detail with the patient.  All questions were answered  Follow-up: Return in about 2 weeks (around 07/24/2019) for LROB. by phone  No orders of the defined types were placed in this encounter.  By signing my name below, I, De Burrs, attest that this documentation has been prepared under the direction and in the presence of Jonnie Kind, MD. Electronically Signed: De Burrs, Medical Scribe. 07/10/19. 10:07 AM.  I personally performed the services described in this documentation, which was SCRIBED in my presence. The recorded information has been reviewed and considered accurate. It has been edited as necessary during review. Jonnie Kind, MD

## 2019-07-14 ENCOUNTER — Encounter: Payer: BC Managed Care – PPO | Admitting: Women's Health

## 2019-07-24 ENCOUNTER — Ambulatory Visit (INDEPENDENT_AMBULATORY_CARE_PROVIDER_SITE_OTHER): Payer: BC Managed Care – PPO | Admitting: Medical

## 2019-07-24 ENCOUNTER — Encounter: Payer: Self-pay | Admitting: Medical

## 2019-07-24 ENCOUNTER — Other Ambulatory Visit: Payer: Self-pay

## 2019-07-24 ENCOUNTER — Telehealth: Payer: Self-pay | Admitting: *Deleted

## 2019-07-24 VITALS — BP 118/65 | HR 97 | Wt 337.0 lb

## 2019-07-24 DIAGNOSIS — Z331 Pregnant state, incidental: Secondary | ICD-10-CM

## 2019-07-24 DIAGNOSIS — Z3A32 32 weeks gestation of pregnancy: Secondary | ICD-10-CM

## 2019-07-24 DIAGNOSIS — R768 Other specified abnormal immunological findings in serum: Secondary | ICD-10-CM

## 2019-07-24 DIAGNOSIS — Z8751 Personal history of pre-term labor: Secondary | ICD-10-CM

## 2019-07-24 DIAGNOSIS — R1084 Generalized abdominal pain: Secondary | ICD-10-CM

## 2019-07-24 DIAGNOSIS — Z3483 Encounter for supervision of other normal pregnancy, third trimester: Secondary | ICD-10-CM

## 2019-07-24 DIAGNOSIS — Z8632 Personal history of gestational diabetes: Secondary | ICD-10-CM

## 2019-07-24 DIAGNOSIS — Z1389 Encounter for screening for other disorder: Secondary | ICD-10-CM

## 2019-07-24 LAB — POCT URINALYSIS DIPSTICK OB
Blood, UA: NEGATIVE
Glucose, UA: NEGATIVE
Leukocytes, UA: NEGATIVE
Nitrite, UA: NEGATIVE
POC,PROTEIN,UA: NEGATIVE

## 2019-07-24 MED ORDER — CYCLOBENZAPRINE HCL 10 MG PO TABS: 10.0000 mg | ORAL_TABLET | Freq: Two times a day (BID) | ORAL | 0 refills | Status: DC | PRN

## 2019-07-24 NOTE — Progress Notes (Signed)
    PRENATAL VISIT NOTE  Subjective:  Patricia [name] is a 28 y.o. [phone] at [redacted]w[redacted]d being seen today for ongoing prenatal care.  She is currently monitored for the following issues for this high-risk pregnancy and has Obesity; HSV-2 seropositive; History of preterm delivery; Postpartum depression; History of gestational diabetes; Supervision of normal pregnancy; Asymptomatic bacteriuria during pregnancy in first trimester; and Spotting affecting pregnancy in second trimester on their problem list.  Patient reports abdominal tightening and pelvic pressure x 2 weeks. She denies vaginal bleeding, discharge, LOF. She has had some white, mucous discharge. Contractions: Regular. Vag. Bleeding: None.  Movement: Present. Denies leaking of fluid.   The following portions of the patient's history were reviewed and updated as appropriate: allergies, current medications, past family history, past medical history, past social history, past surgical history and problem list.   Objective:   Vitals:   07/24/19 1525  BP: 118/65  Pulse: 97  Weight: (!) 337 lb (152.9 kg)    Fetal Status: Fetal Heart Rate (bpm): 145   Movement: Present     General:  Alert, oriented and cooperative. Patient is in no acute distress.  Skin: Skin is warm and dry. No rash noted.   Cardiovascular: Normal heart rate noted  Respiratory: Normal respiratory effort, no problems with respiration noted  Abdomen: Soft, gravid, appropriate for gestational age.  Pain/Pressure: Present     Pelvic: Cervical exam performed Dilation: Closed Effacement (%): Thick Station: Ballotable  Extremities: Normal range of motion.  Edema: Trace  Mental Status: Normal mood and affect. Normal behavior. Normal judgment and thought content.   Assessment and Plan:  Pregnancy: [id number] at [redacted]w[redacted]d 1. Encounter for supervision of other normal pregnancy in third trimester  2. Screening for genitourinary condition - POC Urinalysis Dipstick OB  3. History of  preterm delivery - Collected FFN, cervix closed, FFN discarded   5. HSV-2 seropositive - Plan to start Valtrex at 34 weeks   6. History of gestational diabetes - Normal GTT with this pregnancy   7. Class 3 severe obesity due to excess calories without serious comorbidity in adult, unspecified BMI ([place])  8. Abdominal pain in pregnancy - Flexeril   Preterm labor symptoms and general obstetric precautions including but not limited to vaginal bleeding, contractions, leaking of fluid and fetal movement were reviewed in detail with the patient. Please refer to After Visit Summary for other counseling recommendations.   Return in about 2 weeks (around 08/07/2019) for [facility].  No future appointments.  Kerry Hough, PA-C

## 2019-07-24 NOTE — Patient Instructions (Signed)
Fetal Movement Counts Patient Name: ________________________________________________ Patient Due Date: ____________________ What is a fetal movement count?  A fetal movement count is the number of times that you feel your baby move during a certain amount of time. This may also be called a fetal kick count. A fetal movement count is recommended for every pregnant woman. You may be asked to start counting fetal movements as early as week 28 of your pregnancy. Pay attention to when your baby is most active. You may notice your baby's sleep and wake cycles. You may also notice things that make your baby move more. You should do a fetal movement count:  When your baby is normally most active.  At the same time each day. A good time to count movements is while you are resting, after having something to eat and drink. How do I count fetal movements? 1. Find a quiet, comfortable area. Sit, or lie down on your side. 2. Write down the date, the start time and stop time, and the number of movements that you felt between those two times. Take this information with you to your health care visits. 3. For 2 hours, count kicks, flutters, swishes, rolls, and jabs. You should feel at least 10 movements during 2 hours. 4. You may stop counting after you have felt 10 movements. 5. If you do not feel 10 movements in 2 hours, have something to eat and drink. Then, keep resting and counting for 1 hour. If you feel at least 4 movements during that hour, you may stop counting. Contact a health care provider if:  You feel fewer than 4 movements in 2 hours.  Your baby is not moving like he or she usually does. Date: ____________ Start time: ____________ Stop time: ____________ Movements: ____________ Date: ____________ Start time: ____________ Stop time: ____________ Movements: ____________ Date: ____________ Start time: ____________ Stop time: ____________ Movements: ____________ Date: ____________ Start time:  ____________ Stop time: ____________ Movements: ____________ Date: ____________ Start time: ____________ Stop time: ____________ Movements: ____________ Date: ____________ Start time: ____________ Stop time: ____________ Movements: ____________ Date: ____________ Start time: ____________ Stop time: ____________ Movements: ____________ Date: ____________ Start time: ____________ Stop time: ____________ Movements: ____________ Date: ____________ Start time: ____________ Stop time: ____________ Movements: ____________ This information is not intended to replace advice given to you by your health care provider. Make sure you discuss any questions you have with your health care provider. Document Released: 12/13/2006 Document Revised: 12/03/2018 Document Reviewed: 12/23/2015 Elsevier Patient Education  2020 Else[employer] Inc. Braxton Hicks Contractions Contractions of the uterus can occur throughout pregnancy, but they are not always a sign that you are in labor. You may have practice contractions called Braxton Hicks contractions. These false labor contractions are sometimes confused with true labor. What are Braxton Hicks contractions? Braxton Hicks contractions are tightening movements that occur in the muscles of the uterus before labor. Unlike true labor contractions, these contractions do not result in opening (dilation) and thinning of the cervix. Toward the end of pregnancy (32-34 weeks), Braxton Hicks contractions can happen more often and may become stronger. These contractions are sometimes difficult to tell apart from true labor because they can be very uncomfortable. You should not feel embarrassed if you go to the hospital with false labor. Sometimes, the only way to tell if you are in true labor is for your health care provider to look for changes in the cervix. The health care provider will do a physical exam and may monitor your contractions. If you   are not in true labor, the exam should show  that your cervix is not dilating and your water has not broken. If there are no other health problems associated with your pregnancy, it is completely safe for you to be sent home with false labor. You may continue to have Braxton Hicks contractions until you go into true labor. How to tell the difference between true labor and false labor True labor  Contractions last 30-70 seconds.  Contractions become very regular.  Discomfort is usually felt in the top of the uterus, and it spreads to the lower abdomen and low back.  Contractions do not go away with walking.  Contractions usually become more intense and increase in frequency.  The cervix dilates and gets thinner. False labor  Contractions are usually shorter and not as strong as true labor contractions.  Contractions are usually irregular.  Contractions are often felt in the front of the lower abdomen and in the groin.  Contractions may go away when you walk around or change positions while lying down.  Contractions get weaker and are shorter-lasting as time goes on.  The cervix usually does not dilate or become thin. Follow these instructions at home:   Take over-the-counter and prescription medicines only as told by your health care provider.  Keep up with your usual exercises and follow other instructions from your health care provider.  Eat and drink lightly if you think you are going into labor.  If Braxton Hicks contractions are making you uncomfortable: ? Change your position from lying down or resting to walking, or change from walking to resting. ? Sit and rest in a tub of warm water. ? Drink enough fluid to keep your urine pale yellow. Dehydration may cause these contractions. ? Do slow and deep breathing several times an hour.  Keep all follow-up prenatal visits as told by your health care provider. This is important. Contact a health care provider if:  You have a fever.  You have continuous pain in  your abdomen. Get help right away if:  Your contractions become stronger, more regular, and closer together.  You have fluid leaking or gushing from your vagina.  You pass blood-tinged mucus (bloody show).  You have bleeding from your vagina.  You have low back pain that you never had before.  You feel your baby's head pushing down and causing pelvic pressure.  Your baby is not moving inside you as much as it used to. Summary  Contractions that occur before labor are called Braxton Hicks contractions, false labor, or practice contractions.  Braxton Hicks contractions are usually shorter, weaker, farther apart, and less regular than true labor contractions. True labor contractions usually become progressively stronger and regular, and they become more frequent.  Manage discomfort from Braxton Hicks contractions by changing position, resting in a warm bath, drinking plenty of water, or practicing deep breathing. This information is not intended to replace advice given to you by your health care provider. Make sure you discuss any questions you have with your health care provider. Document Released: 03/29/2017 Document Revised: 10/26/2017 Document Reviewed: 03/29/2017 Elsevier Patient Education  2020 Elsevier Inc.  

## 2019-07-24 NOTE — Telephone Encounter (Signed)
Pt is having pressure and contractions. Pain in lower back. Pt don't want to takeTylenol all the time. Pt has tried support belt, warm showers. Nothing is helping. + baby movement. No leaking fluid. Pt is scheduled for a virtual visit today. Advised to come in and she can have an in person visit. Pt voiced understanding. [place]

## 2019-07-31 ENCOUNTER — Other Ambulatory Visit: Payer: Self-pay

## 2019-07-31 ENCOUNTER — Telehealth: Payer: Self-pay | Admitting: *Deleted

## 2019-07-31 DIAGNOSIS — Z20822 Contact with and (suspected) exposure to covid-19: Secondary | ICD-10-CM

## 2019-07-31 DIAGNOSIS — R6889 Other general symptoms and signs: Secondary | ICD-10-CM | POA: Diagnosis not present

## 2019-07-31 NOTE — Telephone Encounter (Signed)
Called patient back, she has a coworker that has covid symptoms and hasn't been wearing a mask the right way. Coworker is positive. Patient went to be tested today. Advised her to let us know if her test is positive.

## 2019-07-31 NOTE — Telephone Encounter (Signed)
Pt called and stated that her coworker tested positive for covid. She went today and was tested at [facility]. Would like a call back.

## 2019-08-01 LAB — NOVEL CORONAVIRUS, NAA: SARS-CoV-2, NAA: NOT DETECTED

## 2019-08-07 ENCOUNTER — Ambulatory Visit (INDEPENDENT_AMBULATORY_CARE_PROVIDER_SITE_OTHER): Payer: BC Managed Care – PPO | Admitting: Advanced Practice Midwife

## 2019-08-07 ENCOUNTER — Other Ambulatory Visit: Payer: Self-pay

## 2019-08-07 ENCOUNTER — Encounter: Payer: Self-pay | Admitting: Advanced Practice Midwife

## 2019-08-07 VITALS — BP 119/68 | HR 98 | Wt 333.0 lb

## 2019-08-07 DIAGNOSIS — Z1389 Encounter for screening for other disorder: Secondary | ICD-10-CM

## 2019-08-07 DIAGNOSIS — O0993 Supervision of high risk pregnancy, unspecified, third trimester: Secondary | ICD-10-CM

## 2019-08-07 DIAGNOSIS — O0990 Supervision of high risk pregnancy, unspecified, unspecified trimester: Secondary | ICD-10-CM | POA: Insufficient documentation

## 2019-08-07 DIAGNOSIS — Z3A34 34 weeks gestation of pregnancy: Secondary | ICD-10-CM

## 2019-08-07 DIAGNOSIS — O26843 Uterine size-date discrepancy, third trimester: Secondary | ICD-10-CM

## 2019-08-07 DIAGNOSIS — Z331 Pregnant state, incidental: Secondary | ICD-10-CM

## 2019-08-07 MED ORDER — ACYCLOVIR 400 MG PO TABS: 400.0000 mg | ORAL_TABLET | Freq: Three times a day (TID) | ORAL | 2 refills | Status: DC

## 2019-08-07 NOTE — Patient Instructions (Signed)
Patricia [name], I greatly value your feedback.  If you receive a survey following your visit with [place] today, we appreciate you taking the time to fill it out.  Thanks, Nigel Berthold, DNP, CNM  [facility]!!! It is now [facility] at [facility] ([place], [place] [address]) Entrance located off of [address] parking   Go to [email or web address] to register for FREE online childbirth classes    Call the office [phone]) or go to [place] if:  You begin to have strong, frequent contractions  Your water breaks.  Sometimes it is a big gush of fluid, sometimes it is just a trickle that keeps getting your panties wet or running down your legs  You have vaginal bleeding.  It is normal to have a small amount of spotting if your cervix was checked.   You don't feel your baby moving like normal.  If you don't, get you something to eat and drink and lay down and focus on feeling your baby move.  You should feel at least 10 movements in 2 hours.  If you don't, you should call the office or go to [place] Blood Pressure Monitoring for Patients   Your provider has recommended that you check your blood pressure (BP) at least once a week at home. If you do not have a blood pressure cuff at home, one will be provided for you. Contact your provider if you have not received your monitor within 1 week.   Helpful Tips for Accurate Home Blood Pressure Checks  . Don't smoke, exercise, or drink caffeine 30 minutes before checking your BP . Use the restroom before checking your BP (a full bladder can raise your pressure) . Relax in a comfortable upright chair . Feet on the ground . Left arm resting comfortably on a flat surface at the level of your heart . Legs uncrossed . Back supported . Sit quietly and don't talk . Place the cuff on your bare arm . Adjust snuggly, so that only two fingertips can fit  between your skin and the top of the cuff . Check 2 readings separated by at least one minute . Keep a log of your BP readings . For a visual, please reference this diagram: http://ccnc.care/bpdiagram  Provider Name: [facility]     Phone: [phone]  Zone 1: ALL CLEAR  Continue to monitor your symptoms:  . BP reading is less than 140 (top number) or less than 90 (bottom number)  . No right upper stomach pain . No headaches or seeing spots . No feeling nauseated or throwing up . No swelling in face and hands  Zone 2: CAUTION Call your doctor's office for any of the following:  . BP reading is greater than 140 (top number) or greater than 90 (bottom number)  . Stomach pain under your ribs in the middle or right side . Headaches or seeing spots . Feeling nauseated or throwing up . Swelling in face and hands  Zone 3: EMERGENCY  Seek immediate medical care if you have any of the following:  . BP reading is greater than160 (top number) or greater than 110 (bottom number) . Severe headaches not improving with Tylenol . Serious difficulty catching your breath . Any worsening symptoms from Zone 2

## 2019-08-07 NOTE — Addendum Note (Signed)
Addended by: Linton Rump on: 08/07/2019 11:46 AM   Modules accepted: Orders

## 2019-08-07 NOTE — Progress Notes (Signed)
LOW-RISK PREGNANCY VISIT Patient name: Patricia [name] MRN [id number]  Date of birth: 01-05-1991 Chief Complaint:   High Risk Gestation  History of Present Illness:   Patricia [name] is a 28 y.o. [id number] female at 1046w4d with an Estimated Date of Delivery: 09/14/19 being seen today for ongoing management of a low-risk pregnancy.  Today she reports no complaints. Contractions: Irregular. Vag. Bleeding: None.  Movement: Present. denies leaking of fluid. [name]:   Pertinent items are noted in HPI Denies abnormal vaginal discharge w/ itching/odor/irritation, headaches, visual changes, shortness of breath, chest pain, abdominal pain, severe nausea/vomiting, or problems with urination or bowel movements unless otherwise stated above.  Pertinent History Reviewed:  Medical & Surgical Hx:   Past Medical History:  Diagnosis Date  . Anemia   . Bacterial vaginosis   . Chlamydia   . Depression    PP depression after 3rd pregnancy - resolved with therarpy  . Migraine   . Preterm labor   . Preterm labor without delivery in second trimester June 2012   Positive FFN in June  . UTI (lower urinary tract infection)    Past Surgical History:  Procedure Laterality Date  . NO PAST SURGERIES     Family History  Problem Relation Age of Onset  . Diabetes Other        great granmother  . Diabetes Paternal Grandfather   . Hypertension Paternal Grandfather   . Cancer Paternal Grandfather        prostate  . Miscarriages / Stillbirths Maternal Grandmother     Current Outpatient Medications:  .  Blood Pressure Monitor MISC, For regular home bp monitoring during pregnancy, Disp: 1 each, Rfl: 0 .  cyclobenzaprine (FLEXERIL) 10 MG tablet, Take 1 tablet (10 mg total) by mouth 2 (two) times daily as needed for muscle spasms., Disp: 20 tablet, Rfl: 0 .  ferrous sulfate 325 (65 FE) MG tablet, Take 1 tablet (325 mg total) by mouth 2 (two) times daily with a meal., Disp: 60 tablet, Rfl: 3 .   Prenat w/o A-FeCbGl-DSS-FA-DHA (CITRANATAL ASSURE) 35-1 & 300 MG tablet, One tablet and one capsule daily, Disp: 60 tablet, Rfl: 11 .  acyclovir (ZOVIRAX) 400 MG tablet, Take 1 tablet (400 mg total) by mouth 3 (three) times daily., Disp: 90 tablet, Rfl: 2 Social History: Reviewed -  reports that she has never smoked. She has never used smokeless tobacco.  Physical Assessment:   Vitals:   08/07/19 1028  BP: 119/68  Pulse: 98  Weight: (!) 333 lb (151 kg)  Body mass index is 53.75 kg/m.        Physical Examination:   General appearance: Well appearing, and in no distress  Mental status: Alert, oriented to person, place, and time  Skin: Warm & dry  Cardiovascular: Normal heart rate noted  Respiratory: Normal respiratory effort, no distress  Abdomen: Soft, gravid, nontender  Pelvic: Cervical exam performed  Dilation: 1 Effacement (%): Thick Station: Ballotable  Extremities: Edema: Trace  Fetal Status:   Fundal Height: 40 cm Movement: Present Presentation: Vertex  No results found for this or any previous visit (from the past 24 hour(s)).  Assessment & Plan:  1) Low-risk pregnancy [id number] at 3346w4d with an Estimated Date of Delivery: 09/14/19   2) HSV 2, start acyclovir   Labs/procedures/US today:   Plan:  Continue routine obstetrical care  EFW d/t size > dates   Follow-up: Return in about 2 weeks (around 08/21/2019) for LROB and EFW .  Orders Placed This Encounter  Procedures  . US OB Follow Up  . POC Urinalysis Dipstick OB   Christin Fudge CNM 08/07/2019 10:58 AM

## 2019-08-18 ENCOUNTER — Other Ambulatory Visit: Payer: Self-pay

## 2019-08-18 ENCOUNTER — Encounter (HOSPITAL_COMMUNITY): Payer: Self-pay

## 2019-08-18 ENCOUNTER — Telehealth: Payer: Self-pay | Admitting: *Deleted

## 2019-08-18 ENCOUNTER — Inpatient Hospital Stay (HOSPITAL_COMMUNITY)
Admission: AD | Admit: 2019-08-18 | Discharge: 2019-08-18 | Disposition: A | Discharge status: Home / Self Care | Payer: BC Managed Care – PPO | Attending: Obstetrics and Gynecology | Admitting: Obstetrics and Gynecology

## 2019-08-18 DIAGNOSIS — Z3A36 36 weeks gestation of pregnancy: Secondary | ICD-10-CM | POA: Diagnosis not present

## 2019-08-18 DIAGNOSIS — O4700 False labor before 37 completed weeks of gestation, unspecified trimester: Secondary | ICD-10-CM | POA: Diagnosis present

## 2019-08-18 DIAGNOSIS — O4703 False labor before 37 completed weeks of gestation, third trimester: Secondary | ICD-10-CM | POA: Insufficient documentation

## 2019-08-18 NOTE — Telephone Encounter (Signed)
Was having pain in vaginal area, felt like she was having contractions. Went to [facility]. Pt was 1-1.5cm. Was told she was having Braxton Hicks contractions. Pt is taking Tylenol and Flexeril. Advised to drink plenty of water, take Tylenol and Flexeril as needed. Keep appt for Thursday and take it easy. Pt voiced understanding. [place]

## 2019-08-18 NOTE — Telephone Encounter (Signed)
Left message @ 12:21 pm. JSY 

## 2019-08-18 NOTE — MAU Provider Note (Signed)
Patricia [name] is a [id number] at [redacted]w[redacted]d seen in MAU for labor. RN labor check, not seen by provider.  SVE by RN Dilation: Fingertip Effacement (%): Thick Exam by:: Reed Pandy, RN   NST - FHR: 140 bpm / moderate variability / accels present / decels absent / TOCO: irregular UCs   Plan: 1. False Labor - D/C home with labor precautions - Keep scheduled appt with [facility] on 08/21/2019 for U/S and OB visit  Laury Deep, CNM  08/18/2019 12:17 PM

## 2019-08-18 NOTE — Telephone Encounter (Signed)
Returning Janet's call.  

## 2019-08-18 NOTE — MAU Note (Signed)
Pt presents to MAU with c/o ctx that started today around 0230. She has since then felt ctx every 15-20 min. She denies VB and LOF. +FM

## 2019-08-18 NOTE — Discharge Instructions (Signed)
Braxton Hicks Contractions Contractions of the uterus can occur throughout pregnancy, but they are not always a sign that you are in labor. You may have practice contractions called Braxton Hicks contractions. These false labor contractions are sometimes confused with true labor. What are Braxton Hicks contractions? Braxton Hicks contractions are tightening movements that occur in the muscles of the uterus before labor. Unlike true labor contractions, these contractions do not result in opening (dilation) and thinning of the cervix. Toward the end of pregnancy (32-34 weeks), Braxton Hicks contractions can happen more often and may become stronger. These contractions are sometimes difficult to tell apart from true labor because they can be very uncomfortable. You should not feel embarrassed if you go to the hospital with false labor. Sometimes, the only way to tell if you are in true labor is for your health care provider to look for changes in the cervix. The health care provider will do a physical exam and may monitor your contractions. If you are not in true labor, the exam should show that your cervix is not dilating and your water has not broken. If there are no other health problems associated with your pregnancy, it is completely safe for you to be sent home with false labor. You may continue to have Braxton Hicks contractions until you go into true labor. How to tell the difference between true labor and false labor True labor  Contractions last 30-70 seconds.  Contractions become very regular.  Discomfort is usually felt in the top of the uterus, and it spreads to the lower abdomen and low back.  Contractions do not go away with walking.  Contractions usually become more intense and increase in frequency.  The cervix dilates and gets thinner. False labor  Contractions are usually shorter and not as strong as true labor contractions.  Contractions are usually irregular.  Contractions  are often felt in the front of the lower abdomen and in the groin.  Contractions may go away when you walk around or change positions while lying down.  Contractions get weaker and are shorter-lasting as time goes on.  The cervix usually does not dilate or become thin. Follow these instructions at home:   Take over-the-counter and prescription medicines only as told by your health care provider.  Keep up with your usual exercises and follow other instructions from your health care provider.  Eat and drink lightly if you think you are going into labor.  If Braxton Hicks contractions are making you uncomfortable: ? Change your position from lying down or resting to walking, or change from walking to resting. ? Sit and rest in a tub of warm water. ? Drink enough fluid to keep your urine pale yellow. Dehydration may cause these contractions. ? Do slow and deep breathing several times an hour.  Keep all follow-up prenatal visits as told by your health care provider. This is important. Contact a health care provider if:  You have a fever.  You have continuous pain in your abdomen. Get help right away if:  Your contractions become stronger, more regular, and closer together.  You have fluid leaking or gushing from your vagina.  You pass blood-tinged mucus (bloody show).  You have bleeding from your vagina.  You have low back pain that you never had before.  You feel your baby's head pushing down and causing pelvic pressure.  Your baby is not moving inside you as much as it used to. Summary  Contractions that occur before labor are   called Braxton Hicks contractions, false labor, or practice contractions.  Braxton Hicks contractions are usually shorter, weaker, farther apart, and less regular than true labor contractions. True labor contractions usually become progressively stronger and regular, and they become more frequent.  Manage discomfort from Braxton Hicks contractions  by changing position, resting in a warm bath, drinking plenty of water, or practicing deep breathing. This information is not intended to replace advice given to you by your health care provider. Make sure you discuss any questions you have with your health care provider. Document Released: 03/29/2017 Document Revised: 10/26/2017 Document Reviewed: 03/29/2017 Elsevier Patient Education  2020 [employer]  

## 2019-08-21 ENCOUNTER — Ambulatory Visit (INDEPENDENT_AMBULATORY_CARE_PROVIDER_SITE_OTHER): Payer: BC Managed Care – PPO

## 2019-08-21 ENCOUNTER — Encounter: Payer: Self-pay | Admitting: Obstetrics and Gynecology

## 2019-08-21 ENCOUNTER — Other Ambulatory Visit: Payer: Self-pay

## 2019-08-21 ENCOUNTER — Ambulatory Visit (INDEPENDENT_AMBULATORY_CARE_PROVIDER_SITE_OTHER): Payer: BC Managed Care – PPO | Admitting: Obstetrics and Gynecology

## 2019-08-21 VITALS — BP 116/64 | HR 95 | Wt 331.8 lb

## 2019-08-21 DIAGNOSIS — O26843 Uterine size-date discrepancy, third trimester: Secondary | ICD-10-CM | POA: Diagnosis not present

## 2019-08-21 DIAGNOSIS — O26852 Spotting complicating pregnancy, second trimester: Secondary | ICD-10-CM

## 2019-08-21 DIAGNOSIS — Z3483 Encounter for supervision of other normal pregnancy, third trimester: Secondary | ICD-10-CM | POA: Diagnosis not present

## 2019-08-21 DIAGNOSIS — Z3A36 36 weeks gestation of pregnancy: Secondary | ICD-10-CM | POA: Diagnosis not present

## 2019-08-21 DIAGNOSIS — Z1389 Encounter for screening for other disorder: Secondary | ICD-10-CM

## 2019-08-21 DIAGNOSIS — O0990 Supervision of high risk pregnancy, unspecified, unspecified trimester: Secondary | ICD-10-CM

## 2019-08-21 DIAGNOSIS — Z331 Pregnant state, incidental: Secondary | ICD-10-CM

## 2019-08-21 LAB — POCT URINALYSIS DIPSTICK OB
Blood, UA: NEGATIVE
Glucose, UA: NEGATIVE
Ketones, UA: NEGATIVE
Leukocytes, UA: NEGATIVE
Nitrite, UA: NEGATIVE
POC,PROTEIN,UA: NEGATIVE

## 2019-08-21 NOTE — Progress Notes (Signed)
[place] 35+6 wks,cephalic,anterior placenta gr 1,fhr 150 bpm,afi 14 cm,efw 2848 g 41%

## 2019-08-21 NOTE — Progress Notes (Signed)
Patient ID: Patricia [name], female   DOB: 30-Oct-1991, 28 y.o.   MRN: [id number]   LOW-RISK PREGNANCY VISIT Patient name: Patricia [name] MRN [id number]  Date of birth: Jun 16, 1991 Chief Complaint:   High Risk Gestation (u/s-EFW/ gbs-gc-chl)  History of Present Illness:   Patricia [name] is a 28 y.o. [id number] female at [redacted]w[redacted]d with an Estimated Date of Delivery: 09/14/19 being seen today for ongoing management of a low-risk pregnancy.  Today she reports no complaints. She was admitted to [facility] for false labor and was discharged on 08/18/2019. She has been taking about 1,000 steps/day. Appears deconditioned. This is her fourth pregnancy and she plans for a BTL in the hospital. Her largest baby was 8 lbs.  Contractions: Irregular.  .  Movement: Present. denies leaking of fluid. [name]:   Pertinent items are noted in HPI Denies abnormal vaginal discharge w/ itching/odor/irritation, headaches, visual changes, shortness of breath, chest pain, abdominal pain, severe nausea/vomiting, or problems with urination or bowel movements unless otherwise stated above. Pertinent History Reviewed:  Reviewed past medical,surgical, social, obstetrical and family history.  Reviewed problem list, medications and allergies. Physical Assessment:   Vitals:   08/21/19 0951  BP: 116/64  Pulse: 95  Weight: (!) 331 lb 12.8 oz (150.5 kg)  Body mass index is 53.55 kg/m.        Physical Examination:   General appearance: Well appearing, and in no distress  Mental status: Alert, oriented to person, place, and time  Skin: Warm & dry  Cardiovascular: Normal heart rate noted  Respiratory: Normal respiratory effort, no distress  Abdomen: Soft, gravid, nontender  Pelvic: Cervical exam performed   External Genitalia: no HSV lesions  Cervisx: closed, long        Extremities: Edema: Trace  Fetal Status: Fetal Heart Rate (bpm): 150   Movement: Present    Results for orders placed or performed in visit on  08/21/19 (from the past 24 hour(s))  POC Urinalysis Dipstick OB   Collection Time: 08/21/19  9:49 AM  Result Value Ref Range   Color, UA     Clarity, UA     Glucose, UA Negative Negative   Bilirubin, UA     Ketones, UA neg    Spec Grav, UA     Blood, UA neg    pH, UA     POC,PROTEIN,UA Negative Negative, Trace, Small (1+), Moderate (2+), Large (3+), 4+   Urobilinogen, UA     Nitrite, UA neg    Leukocytes, UA Negative Negative   Appearance     Odor      Assessment & Plan:  1) Low-risk pregnancy [id number] at [redacted]w[redacted]d with an Estimated Date of Delivery: 09/14/19   2) HSV 2, began acyclovir  3) Desires BTL, papers have been signed, May need oupt laparoscopic tubal given her obesity.   Plan:  Continue routine obstetrical care  Meds: No orders of the defined types were placed in this encounter.  Labs/procedures today: GC/CHL, GBS  US 36+4 wks,cephalic,anterior placenta gr 1,fhr 150 bpm,afi 14 cm,efw 2848 g 41%   Reviewed: Term labor symptoms and general obstetric precautions including but not limited to vaginal bleeding, contractions, leaking of fluid and fetal movement were reviewed in detail with the patient.  All questions were answered  Follow-up: Return in about 1 week (around 08/28/2019) for LROB.  Orders Placed This Encounter  Procedures   Strep Gp B NAA   GC/Chlamydia Probe Amp   POC Urinalysis Dipstick OB  By signing my name below, I, De Burrs, attest that this documentation has been prepared under the direction and in the presence of Jonnie Kind, MD. Electronically Signed: De Burrs, Medical Scribe. 08/21/19. 9:59 AM.  I personally performed the services described in this documentation, which was SCRIBED in my presence. The recorded information has been reviewed and considered accurate. It has been edited as necessary during review. Jonnie Kind, MD

## 2019-08-23 LAB — STREP GP B NAA: Strep Gp B NAA: POSITIVE — AB

## 2019-08-26 LAB — GC/CHLAMYDIA PROBE AMP
Chlamydia trachomatis, NAA: NEGATIVE
Neisseria Gonorrhoeae by PCR: NEGATIVE

## 2019-08-28 ENCOUNTER — Telehealth (INDEPENDENT_AMBULATORY_CARE_PROVIDER_SITE_OTHER): Payer: BC Managed Care – PPO | Admitting: Obstetrics & Gynecology

## 2019-08-28 ENCOUNTER — Encounter: Payer: Self-pay | Admitting: Obstetrics & Gynecology

## 2019-08-28 ENCOUNTER — Other Ambulatory Visit: Payer: Self-pay

## 2019-08-28 VITALS — BP 119/63 | HR 90 | Ht 67.0 in | Wt 328.4 lb

## 2019-08-28 DIAGNOSIS — O26852 Spotting complicating pregnancy, second trimester: Secondary | ICD-10-CM

## 2019-08-28 DIAGNOSIS — O0993 Supervision of high risk pregnancy, unspecified, third trimester: Secondary | ICD-10-CM

## 2019-08-28 DIAGNOSIS — O4703 False labor before 37 completed weeks of gestation, third trimester: Secondary | ICD-10-CM

## 2019-08-28 DIAGNOSIS — Z3A37 37 weeks gestation of pregnancy: Secondary | ICD-10-CM

## 2019-08-28 DIAGNOSIS — O26853 Spotting complicating pregnancy, third trimester: Secondary | ICD-10-CM

## 2019-08-28 DIAGNOSIS — O0990 Supervision of high risk pregnancy, unspecified, unspecified trimester: Secondary | ICD-10-CM

## 2019-08-28 DIAGNOSIS — Z3483 Encounter for supervision of other normal pregnancy, third trimester: Secondary | ICD-10-CM

## 2019-08-28 NOTE — Progress Notes (Signed)
Patient ID: Patricia [name], female   DOB: 07-11-1991, 28 y.o.   MRN: [id number]   TELEHEALTH VIRTUAL OBSTETRICS VISIT ENCOUNTER NOTE  I connected with Milana Huntsman on 08/28/19 at  4:10 PM EDT by telephone at home and verified that I am speaking with the correct person using two identifiers.   I discussed the limitations, risks, security and privacy concerns of performing an evaluation and management service by telephone and the availability of in person appointments. I also discussed with the patient that there may be a patient responsible charge related to this service. The patient expressed understanding and agreed to proceed.  Subjective:  Patricia [name] is a 28 y.o. [phone] at [redacted]w[redacted]d being followed for ongoing prenatal care.  She is currently monitored for the following issues for this low-risk pregnancy and has Obesity; HSV-2 seropositive; History of preterm delivery; Postpartum depression; History of gestational diabetes; Supervision of normal pregnancy; Asymptomatic bacteriuria during pregnancy in first trimester; Spotting affecting pregnancy in second trimester; Supervision of high risk pregnancy, antepartum; and False labor before 37 completed weeks of gestation on their problem list.  Patient reports no complaints. Reports fetal movement. Denies any contractions, bleeding or leaking of fluid.   The following portions of the patient's history were reviewed and updated as appropriate: allergies, current medications, past family history, past medical history, past social history, past surgical history and problem list.   Objective:   General:  Alert, oriented and cooperative.   Mental Status: Normal mood and affect perceived. Normal judgment and thought content.  Rest of physical exam deferred due to type of encounter  Assessment and Plan:  Pregnancy: [id number] at [redacted]w[redacted]d 1. Supervision of high risk pregnancy, antepartum     Term labor symptoms and general obstetric precautions  including but not limited to vaginal bleeding, contractions, leaking of fluid and fetal movement were reviewed in detail with the patient.  I discussed the assessment and treatment plan with the patient. The patient was provided an opportunity to ask questions and all were answered. The patient agreed with the plan and demonstrated an understanding of the instructions. The patient was advised to call back or seek an in-person office evaluation/go to MAU at [facility] for any urgent or concerning symptoms. Please refer to After Visit Summary for other counseling recommendations.   I provided 11 minutes of non-face-to-face time during this encounter.  Return in about 1 week (around 09/04/2019) for [place].  No future appointments.  Florian Buff, MD [facility], [place]

## 2019-09-04 ENCOUNTER — Other Ambulatory Visit: Payer: Self-pay

## 2019-09-04 ENCOUNTER — Ambulatory Visit (INDEPENDENT_AMBULATORY_CARE_PROVIDER_SITE_OTHER): Payer: BC Managed Care – PPO | Admitting: Obstetrics and Gynecology

## 2019-09-04 ENCOUNTER — Encounter: Payer: Self-pay | Admitting: Obstetrics and Gynecology

## 2019-09-04 VITALS — BP 101/61 | HR 98 | Wt 334.2 lb

## 2019-09-04 DIAGNOSIS — Z3A38 38 weeks gestation of pregnancy: Secondary | ICD-10-CM

## 2019-09-04 DIAGNOSIS — O0990 Supervision of high risk pregnancy, unspecified, unspecified trimester: Secondary | ICD-10-CM

## 2019-09-04 DIAGNOSIS — O0993 Supervision of high risk pregnancy, unspecified, third trimester: Secondary | ICD-10-CM

## 2019-09-04 DIAGNOSIS — Z1389 Encounter for screening for other disorder: Secondary | ICD-10-CM

## 2019-09-04 DIAGNOSIS — O99820 Streptococcus B carrier state complicating pregnancy: Secondary | ICD-10-CM

## 2019-09-04 DIAGNOSIS — Z331 Pregnant state, incidental: Secondary | ICD-10-CM

## 2019-09-04 DIAGNOSIS — Z3009 Encounter for other general counseling and advice on contraception: Secondary | ICD-10-CM | POA: Insufficient documentation

## 2019-09-04 DIAGNOSIS — R768 Other specified abnormal immunological findings in serum: Secondary | ICD-10-CM

## 2019-09-04 LAB — POCT URINALYSIS DIPSTICK OB
Blood, UA: NEGATIVE
Glucose, UA: NEGATIVE
Ketones, UA: NEGATIVE
Leukocytes, UA: NEGATIVE
Nitrite, UA: NEGATIVE
POC,PROTEIN,UA: NEGATIVE

## 2019-09-04 NOTE — Patient Instructions (Signed)
Third Trimester of Pregnancy The third trimester is from week 28 through week 40 (months 7 through 9). The third trimester is a time when the unborn baby (fetus) is growing rapidly. At the end of the ninth month, the fetus is about 20 inches in length and weighs 6-10 pounds. Body changes during your third trimester Your body will continue to go through many changes during pregnancy. The changes vary from woman to woman. During the third trimester:  Your weight will continue to increase. You can expect to gain 25-35 pounds (11-16 kg) by the end of the pregnancy.  You may begin to get stretch marks on your hips, abdomen, and breasts.  You may urinate more often because the fetus is moving lower into your pelvis and pressing on your bladder.  You may develop or continue to have heartburn. This is caused by increased hormones that slow down muscles in the digestive tract.  You may develop or continue to have constipation because increased hormones slow digestion and cause the muscles that push waste through your intestines to relax.  You may develop hemorrhoids. These are swollen veins (varicose veins) in the rectum that can itch or be painful.  You may develop swollen, bulging veins (varicose veins) in your legs.  You may have increased body aches in the pelvis, back, or thighs. This is due to weight gain and increased hormones that are relaxing your joints.  You may have changes in your hair. These can include thickening of your hair, rapid growth, and changes in texture. Some women also have hair loss during or after pregnancy, or hair that feels dry or thin. Your hair will most likely return to normal after your baby is born.  Your breasts will continue to grow and they will continue to become tender. A yellow fluid (colostrum) may leak from your breasts. This is the first milk you are producing for your baby.  Your belly button may stick out.  You may notice more swelling in your hands,  face, or ankles.  You may have increased tingling or numbness in your hands, arms, and legs. The skin on your belly may also feel numb.  You may feel short of breath because of your expanding uterus.  You may have more problems sleeping. This can be caused by the size of your belly, increased need to urinate, and an increase in your body's metabolism.  You may notice the fetus "dropping," or moving lower in your abdomen (lightening).  You may have increased vaginal discharge.  You may notice your joints feel loose and you may have pain around your pelvic bone. What to expect at prenatal visits You will have prenatal exams every 2 weeks until week 36. Then you will have weekly prenatal exams. During a routine prenatal visit:  You will be weighed to make sure you and the baby are growing normally.  Your blood pressure will be taken.  Your abdomen will be measured to track your baby's growth.  The fetal heartbeat will be listened to.  Any test results from the previous visit will be discussed.  You may have a cervical check near your due date to see if your cervix has softened or thinned (effaced).  You will be tested for Group B streptococcus. This happens between 35 and 37 weeks. Your health care provider may ask you:  What your birth plan is.  How you are feeling.  If you are feeling the baby move.  If you have had any abnormal   symptoms, such as leaking fluid, bleeding, severe headaches, or abdominal cramping.  If you are using any tobacco products, including cigarettes, chewing tobacco, and electronic cigarettes.  If you have any questions. Other tests or screenings that may be performed during your third trimester include:  Blood tests that check for low iron levels (anemia).  Fetal testing to check the health, activity level, and growth of the fetus. Testing is done if you have certain medical conditions or if there are problems during the pregnancy.  Nonstress test  (NST). This test checks the health of your baby to make sure there are no signs of problems, such as the baby not getting enough oxygen. During this test, a belt is placed around your belly. The baby is made to move, and its heart rate is monitored during movement. What is false labor? False labor is a condition in which you feel small, irregular tightenings of the muscles in the womb (contractions) that usually go away with rest, changing position, or drinking water. These are called Braxton Hicks contractions. Contractions may last for hours, days, or even weeks before true labor sets in. If contractions come at regular intervals, become more frequent, increase in intensity, or become painful, you should see your health care provider. What are the signs of labor?  Abdominal cramps.  Regular contractions that start at 10 minutes apart and become stronger and more frequent with time.  Contractions that start on the top of the uterus and spread down to the lower abdomen and back.  Increased pelvic pressure and dull back pain.  A watery or bloody mucus discharge that comes from the vagina.  Leaking of amniotic fluid. This is also known as your "water breaking." It could be a slow trickle or a gush. Let your health care provider know if it has a color or strange odor. If you have any of these signs, call your health care provider right away, even if it is before your due date. Follow these instructions at home: Medicines  Follow your health care provider's instructions regarding medicine use. Specific medicines may be either safe or unsafe to take during pregnancy.  Take a prenatal vitamin that contains at least 600 micrograms (mcg) of folic acid.  If you develop constipation, try taking a stool softener if your health care provider approves. Eating and drinking   Eat a balanced diet that includes fresh fruits and vegetables, whole grains, good sources of protein such as meat, eggs, or tofu,  and low-fat dairy. Your health care provider will help you determine the amount of weight gain that is right for you.  Avoid raw meat and uncooked cheese. These carry germs that can cause birth defects in the baby.  If you have low calcium intake from food, talk to your health care provider about whether you should take a daily calcium supplement.  Eat four or five small meals rather than three large meals a day.  Limit foods that are high in fat and processed sugars, such as fried and sweet foods.  To prevent constipation: ? Drink enough fluid to keep your urine clear or pale yellow. ? Eat foods that are high in fiber, such as fresh fruits and vegetables, whole grains, and beans. Activity  Exercise only as directed by your health care provider. Most women can continue their usual exercise routine during pregnancy. Try to exercise for 30 minutes at least 5 days a week. Stop exercising if you experience uterine contractions.  Avoid heavy lifting.  Do   not exercise in extreme heat or humidity, or at high altitudes.  Wear low-heel, comfortable shoes.  Practice good posture.  You may continue to have sex unless your health care provider tells you otherwise. Relieving pain and discomfort  Take frequent breaks and rest with your legs elevated if you have leg cramps or low back pain.  Take warm sitz baths to soothe any pain or discomfort caused by hemorrhoids. Use hemorrhoid cream if your health care provider approves.  Wear a good support bra to prevent discomfort from breast tenderness.  If you develop varicose veins: ? Wear support pantyhose or compression stockings as told by your healthcare provider. ? Elevate your feet for 15 minutes, 3-4 times a day. Prenatal care  Write down your questions. Take them to your prenatal visits.  Keep all your prenatal visits as told by your health care provider. This is important. Safety  Wear your seat belt at all times when driving.  Make  a list of emergency phone numbers, including numbers for family, friends, the hospital, and police and fire departments. General instructions  Avoid cat litter boxes and soil used by cats. These carry germs that can cause birth defects in the baby. If you have a cat, ask someone to clean the litter box for you.  Do not travel far distances unless it is absolutely necessary and only with the approval of your health care provider.  Do not use hot tubs, steam rooms, or saunas.  Do not drink alcohol.  Do not use any products that contain nicotine or tobacco, such as cigarettes and e-cigarettes. If you need help quitting, ask your health care provider.  Do not use any medicinal herbs or unprescribed drugs. These chemicals affect the formation and growth of the baby.  Do not douche or use tampons or scented sanitary pads.  Do not cross your legs for long periods of time.  To prepare for the arrival of your baby: ? Take prenatal classes to understand, practice, and ask questions about labor and delivery. ? Make a trial run to the hospital. ? Visit the hospital and tour the maternity area. ? Arrange for maternity or paternity leave through employers. ? Arrange for family and friends to take care of pets while you are in the hospital. ? Purchase a rear-facing car seat and make sure you know how to install it in your car. ? Pack your hospital bag. ? Prepare the baby's nursery. Make sure to remove all pillows and stuffed animals from the baby's crib to prevent suffocation.  Visit your dentist if you have not gone during your pregnancy. Use a soft toothbrush to brush your teeth and be gentle when you floss. Contact a health care provider if:  You are unsure if you are in labor or if your water has broken.  You become dizzy.  You have mild pelvic cramps, pelvic pressure, or nagging pain in your abdominal area.  You have lower back pain.  You have persistent nausea, vomiting, or diarrhea.   You have an unusual or bad smelling vaginal discharge.  You have pain when you urinate. Get help right away if:  Your water breaks before 37 weeks.  You have regular contractions less than 5 minutes apart before 37 weeks.  You have a fever.  You are leaking fluid from your vagina.  You have spotting or bleeding from your vagina.  You have severe abdominal pain or cramping.  You have rapid weight loss or weight gain.  You have   shortness of breath with chest pain.  You notice sudden or extreme swelling of your face, hands, ankles, feet, or legs.  Your baby makes fewer than 10 movements in 2 hours.  You have severe headaches that do not go away when you take medicine.  You have vision changes. Summary  The third trimester is from week 28 through week 40, months 7 through 9. The third trimester is a time when the unborn baby (fetus) is growing rapidly.  During the third trimester, your discomfort may increase as you and your baby continue to gain weight. You may have abdominal, leg, and back pain, sleeping problems, and an increased need to urinate.  During the third trimester your breasts will keep growing and they will continue to become tender. A yellow fluid (colostrum) may leak from your breasts. This is the first milk you are producing for your baby.  False labor is a condition in which you feel small, irregular tightenings of the muscles in the womb (contractions) that eventually go away. These are called Braxton Hicks contractions. Contractions may last for hours, days, or even weeks before true labor sets in.  Signs of labor can include: abdominal cramps; regular contractions that start at 10 minutes apart and become stronger and more frequent with time; watery or bloody mucus discharge that comes from the vagina; increased pelvic pressure and dull back pain; and leaking of amniotic fluid. This information is not intended to replace advice given to you by your health  care provider. Make sure you discuss any questions you have with your health care provider. Document Released: 11/07/2001 Document Revised: 03/06/2019 Document Reviewed: 12/19/2016 Elsevier Patient Education  2020 [employer]  

## 2019-09-04 NOTE — Progress Notes (Signed)
Subjective:  Patricia [name] is a 28 y.o. [id number] at [redacted]w[redacted]d being seen today for ongoing prenatal care.  She is currently monitored for the following issues for this low-risk pregnancy and has Obesity; HSV-2 seropositive; History of preterm delivery; Postpartum depression; History of gestational diabetes; Supervision of normal pregnancy; Asymptomatic bacteriuria during pregnancy in first trimester; Spotting affecting pregnancy in second trimester; Supervision of high risk pregnancy, antepartum; False labor before 37 completed weeks of gestation; Unwanted fertility; and GBS (group B Streptococcus carrier), +RV culture, currently pregnant on their problem list.  Patient reports general discomforts of pregnancy.  Contractions: Irregular.  .  Movement: Present. Denies leaking of fluid.   The following portions of the patient's history were reviewed and updated as appropriate: allergies, current medications, past family history, past medical history, past social history, past surgical history and problem list. Problem list updated.  Objective:   Vitals:   09/04/19 1517  BP: 101/61  Pulse: 98  Weight: (!) 334 lb 3.2 oz (151.6 kg)    Fetal Status:     Movement: Present     General:  Alert, oriented and cooperative. Patient is in no acute distress.  Skin: Skin is warm and dry. No rash noted.   Cardiovascular: Normal heart rate noted  Respiratory: Normal respiratory effort, no problems with respiration noted  Abdomen: Soft, gravid, appropriate for gestational age. Pain/Pressure: Present     Pelvic:  Cervical exam performed        Extremities: Normal range of motion.  Edema: Trace  Mental Status: Normal mood and affect. Normal behavior. Normal judgment and thought content.   Urinalysis:      Assessment and Plan:  Pregnancy: [id number] at [redacted]w[redacted]d  1. Supervision of high risk pregnancy, antepartum Stable Labor precautions  2. Pregnant state, incidental  - POC Urinalysis Dipstick OB  3.  Screening for genitourinary condition  - POC Urinalysis Dipstick OB  4. Unwanted fertility BTL papers signed  5. HSV-2 seropositive Continue with supression  6. GBS (group B Streptococcus carrier), +RV culture, currently pregnant Tx while in labor  Term labor symptoms and general obstetric precautions including but not limited to vaginal bleeding, contractions, leaking of fluid and fetal movement were reviewed in detail with the patient. Please refer to After Visit Summary for other counseling recommendations.  Return in about 1 week (around 09/11/2019) for OB visit, face to face.   Chancy Milroy, MD

## 2019-09-08 ENCOUNTER — Telehealth: Payer: Self-pay | Admitting: Obstetrics & Gynecology

## 2019-09-08 MED ORDER — CYCLOBENZAPRINE HCL 10 MG PO TABS: 10.0000 mg | ORAL_TABLET | Freq: Three times a day (TID) | ORAL | 0 refills | Status: DC | PRN

## 2019-09-11 ENCOUNTER — Ambulatory Visit (INDEPENDENT_AMBULATORY_CARE_PROVIDER_SITE_OTHER): Payer: BC Managed Care – PPO | Admitting: Obstetrics & Gynecology

## 2019-09-11 ENCOUNTER — Other Ambulatory Visit: Payer: Self-pay

## 2019-09-11 VITALS — BP 131/80 | HR 94 | Wt 332.0 lb

## 2019-09-11 DIAGNOSIS — Z1389 Encounter for screening for other disorder: Secondary | ICD-10-CM

## 2019-09-11 DIAGNOSIS — Z331 Pregnant state, incidental: Secondary | ICD-10-CM

## 2019-09-11 DIAGNOSIS — Z3A39 39 weeks gestation of pregnancy: Secondary | ICD-10-CM

## 2019-09-11 DIAGNOSIS — Z3483 Encounter for supervision of other normal pregnancy, third trimester: Secondary | ICD-10-CM

## 2019-09-11 LAB — POCT URINALYSIS DIPSTICK OB
Blood, UA: NEGATIVE
Glucose, UA: NEGATIVE
Ketones, UA: NEGATIVE
Leukocytes, UA: NEGATIVE
Nitrite, UA: NEGATIVE
POC,PROTEIN,UA: NEGATIVE

## 2019-09-11 NOTE — Progress Notes (Signed)
   LOW-RISK PREGNANCY VISIT Patient name: Patricia [name] MRN [id number]  Date of birth: [birth year] Chief Complaint:   Routine Prenatal Visit  History of Present Illness:   Patricia [name] is a 28 y.o. [id number] female at [redacted]w[redacted]d with an Estimated Date of Delivery: 09/14/19 being seen today for ongoing management of a low-risk pregnancy.  Today she reports no complaints. Contractions: Irregular. Vag. Bleeding: None.  Movement: Present. denies leaking of fluid. [name]:   Pertinent items are noted in HPI Denies abnormal vaginal discharge w/ itching/odor/irritation, headaches, visual changes, shortness of breath, chest pain, abdominal pain, severe nausea/vomiting, or problems with urination or bowel movements unless otherwise stated above. Pertinent History Reviewed:  Reviewed past medical,surgical, social, obstetrical and family history.  Reviewed problem list, medications and allergies. Physical Assessment:   Vitals:   09/11/19 0905  BP: 131/80  Pulse: 94  Weight: (!) 332 lb (150.6 kg)  Body mass index is 52 kg/m.        Physical Examination:   General appearance: Well appearing, and in no distress  Mental status: Alert, oriented to person, place, and time  Skin: Warm & dry  Cardiovascular: Normal heart rate noted  Respiratory: Normal respiratory effort, no distress  Abdomen: Soft, gravid, nontender  Pelvic: Cervical exam performed  Dilation: 2.5 Effacement (%): Thick Station: Ballotable  Extremities: Edema: Trace  Fetal Status: Fetal Heart Rate (bpm): 138 Fundal Height: 20 cm Movement: Present Presentation: Vertex  Results for orders placed or performed in visit on 09/11/19 (from the past 24 hour(s))  POC Urinalysis Dipstick OB   Collection Time: 09/11/19  9:09 AM  Result Value Ref Range   Color, UA     Clarity, UA     Glucose, UA Negative Negative   Bilirubin, UA     Ketones, UA n    Spec Grav, UA     Blood, UA n    pH, UA     POC,PROTEIN,UA Negative Negative,  Trace, Small (1+), Moderate (2+), Large (3+), 4+   Urobilinogen, UA     Nitrite, UA n    Leukocytes, UA Negative Negative   Appearance     Odor      Assessment & Plan:  1) Low-risk pregnancy [id number] at [redacted]w[redacted]d with an Estimated Date of Delivery: 09/14/19   2) ,    Meds: No orders of the defined types were placed in this encounter.  Labs/procedures today:   Plan:  Continue routine obstetrical care    Reviewed: Term labor symptoms and general obstetric precautions including but not limited to vaginal bleeding, contractions, leaking of fluid and fetal movement were reviewed in detail with the patient.  All questions were answered. Has home bp cuff. Rx faxed to  Check bp weekly, let us know if >140/90.   Follow-up: Return in about 1 week (around 09/18/2019) for NST, LROB.  Orders Placed This Encounter  Procedures  . POC Urinalysis Dipstick OB   Onnie Hatchel H Reinaldo Helt  09/11/2019 9:30 AM

## 2019-09-15 ENCOUNTER — Other Ambulatory Visit: Payer: Self-pay

## 2019-09-15 ENCOUNTER — Inpatient Hospital Stay (HOSPITAL_COMMUNITY): Payer: BC Managed Care – PPO | Admitting: Anesthesiology

## 2019-09-15 ENCOUNTER — Inpatient Hospital Stay (HOSPITAL_COMMUNITY): Payer: BC Managed Care – PPO | Admitting: Certified Registered Nurse Anesthetist

## 2019-09-15 ENCOUNTER — Inpatient Hospital Stay (HOSPITAL_COMMUNITY)
Admission: AD | Admit: 2019-09-15 | Discharge: 2019-09-16 | DRG: 798 | Disposition: A | Discharge status: Home / Self Care | Payer: BC Managed Care – PPO | Attending: Obstetrics & Gynecology | Admitting: Obstetrics & Gynecology

## 2019-09-15 ENCOUNTER — Encounter (HOSPITAL_COMMUNITY): Payer: Self-pay

## 2019-09-15 ENCOUNTER — Encounter (HOSPITAL_COMMUNITY)
Admission: AD | Disposition: A | Discharge status: Home / Self Care | Payer: Self-pay | Source: Home / Self Care | Attending: Obstetrics & Gynecology

## 2019-09-15 DIAGNOSIS — O26893 Other specified pregnancy related conditions, third trimester: Secondary | ICD-10-CM | POA: Diagnosis not present

## 2019-09-15 DIAGNOSIS — Z302 Encounter for sterilization: Secondary | ICD-10-CM

## 2019-09-15 DIAGNOSIS — Z3A40 40 weeks gestation of pregnancy: Secondary | ICD-10-CM | POA: Diagnosis not present

## 2019-09-15 DIAGNOSIS — Z3009 Encounter for other general counseling and advice on contraception: Secondary | ICD-10-CM | POA: Diagnosis present

## 2019-09-15 DIAGNOSIS — R8271 Bacteriuria: Secondary | ICD-10-CM | POA: Diagnosis present

## 2019-09-15 DIAGNOSIS — O26852 Spotting complicating pregnancy, second trimester: Secondary | ICD-10-CM

## 2019-09-15 DIAGNOSIS — Z23 Encounter for immunization: Secondary | ICD-10-CM

## 2019-09-15 DIAGNOSIS — Z20828 Contact with and (suspected) exposure to other viral communicable diseases: Secondary | ICD-10-CM | POA: Diagnosis not present

## 2019-09-15 DIAGNOSIS — O99214 Obesity complicating childbirth: Secondary | ICD-10-CM | POA: Diagnosis present

## 2019-09-15 DIAGNOSIS — R7689 Other specified abnormal immunological findings in serum: Secondary | ICD-10-CM | POA: Diagnosis present

## 2019-09-15 DIAGNOSIS — Z6791 Unspecified blood type, Rh negative: Secondary | ICD-10-CM

## 2019-09-15 DIAGNOSIS — O99824 Streptococcus B carrier state complicating childbirth: Secondary | ICD-10-CM | POA: Diagnosis present

## 2019-09-15 DIAGNOSIS — Z3483 Encounter for supervision of other normal pregnancy, third trimester: Secondary | ICD-10-CM

## 2019-09-15 DIAGNOSIS — O4703 False labor before 37 completed weeks of gestation, third trimester: Secondary | ICD-10-CM

## 2019-09-15 DIAGNOSIS — O99891 Other specified diseases and conditions complicating pregnancy: Secondary | ICD-10-CM | POA: Diagnosis present

## 2019-09-15 DIAGNOSIS — O26899 Other specified pregnancy related conditions, unspecified trimester: Secondary | ICD-10-CM

## 2019-09-15 DIAGNOSIS — E669 Obesity, unspecified: Secondary | ICD-10-CM | POA: Diagnosis present

## 2019-09-15 DIAGNOSIS — Z8751 Personal history of pre-term labor: Secondary | ICD-10-CM

## 2019-09-15 DIAGNOSIS — O99820 Streptococcus B carrier state complicating pregnancy: Secondary | ICD-10-CM

## 2019-09-15 DIAGNOSIS — O480 Post-term pregnancy: Secondary | ICD-10-CM | POA: Diagnosis not present

## 2019-09-15 DIAGNOSIS — Z8632 Personal history of gestational diabetes: Secondary | ICD-10-CM | POA: Diagnosis present

## 2019-09-15 DIAGNOSIS — F53 Postpartum depression: Secondary | ICD-10-CM | POA: Diagnosis present

## 2019-09-15 DIAGNOSIS — Z3490 Encounter for supervision of normal pregnancy, unspecified, unspecified trimester: Secondary | ICD-10-CM

## 2019-09-15 DIAGNOSIS — R768 Other specified abnormal immunological findings in serum: Secondary | ICD-10-CM | POA: Diagnosis present

## 2019-09-15 HISTORY — PX: TUBAL LIGATION: SHX77

## 2019-09-15 LAB — CBC
HCT: 31.3 % — ABNORMAL LOW (ref 36.0–46.0)
Hemoglobin: 9.9 g/dL — ABNORMAL LOW (ref 12.0–15.0)
MCH: 26.9 pg (ref 26.0–34.0)
MCHC: 31.6 g/dL (ref 30.0–36.0)
MCV: 85.1 fL (ref 80.0–100.0)
Platelets: 207 10*3/uL (ref 150–400)
RBC: 3.68 MIL/uL — ABNORMAL LOW (ref 3.87–5.11)
RDW: 13.4 % (ref 11.5–15.5)
WBC: 9.2 10*3/uL (ref 4.0–10.5)
nRBC: 0 % (ref 0.0–0.2)

## 2019-09-15 LAB — RPR: RPR Ser Ql: NONREACTIVE

## 2019-09-15 LAB — SARS CORONAVIRUS 2 BY RT PCR (HOSPITAL ORDER, PERFORMED IN ~~LOC~~ HOSPITAL LAB): SARS Coronavirus 2: NEGATIVE

## 2019-09-15 LAB — TYPE AND SCREEN
ABO/RH(D): A NEG
Antibody Screen: NEGATIVE
Weak D: POSITIVE

## 2019-09-15 SURGERY — LIGATION, FALLOPIAN TUBE, POSTPARTUM
Anesthesia: Epidural

## 2019-09-15 MED ORDER — LIDOCAINE HCL (PF) 1 % IJ SOLN: 30.0000 mL | INTRAMUSCULAR | Status: DC | PRN

## 2019-09-15 MED ORDER — SODIUM CHLORIDE 0.9 % IV SOLN
2.0000 g | Freq: Once | INTRAVENOUS | Status: AC
  Administered 2019-09-15: 2 g via INTRAVENOUS
  Filled 2019-09-15: qty 2000

## 2019-09-15 MED ORDER — FENTANYL CITRATE (PF) 100 MCG/2ML IJ SOLN
INTRAMUSCULAR | Status: AC
  Filled 2019-09-15: qty 2

## 2019-09-15 MED ORDER — IBUPROFEN 600 MG PO TABS
600.0000 mg | ORAL_TABLET | Freq: Four times a day (QID) | ORAL | Status: DC
  Administered 2019-09-16 (×3): 600 mg via ORAL
  Filled 2019-09-15 (×4): qty 1

## 2019-09-15 MED ORDER — ONDANSETRON HCL 4 MG/2ML IJ SOLN: 4.0000 mg | INTRAMUSCULAR | Status: DC | PRN

## 2019-09-15 MED ORDER — SIMETHICONE 80 MG PO CHEW: 80.0000 mg | CHEWABLE_TABLET | ORAL | Status: DC | PRN

## 2019-09-15 MED ORDER — ACETAMINOPHEN 325 MG PO TABS
650.0000 mg | ORAL_TABLET | ORAL | Status: DC | PRN
  Administered 2019-09-15 – 2019-09-16 (×2): 650 mg via ORAL
  Filled 2019-09-15 (×2): qty 2

## 2019-09-15 MED ORDER — METOCLOPRAMIDE HCL 5 MG/ML IJ SOLN: 10.0000 mg | Freq: Once | INTRAMUSCULAR | Status: DC | PRN

## 2019-09-15 MED ORDER — SODIUM BICARBONATE 8.4 % IV SOLN
INTRAVENOUS | Status: DC | PRN
  Administered 2019-09-15 (×3): 5 mL via EPIDURAL

## 2019-09-15 MED ORDER — BUPIVACAINE HCL (PF) 0.25 % IJ SOLN
INTRAMUSCULAR | Status: DC | PRN
  Administered 2019-09-15: 30 mL

## 2019-09-15 MED ORDER — BENZOCAINE-MENTHOL 20-0.5 % EX AERO
1.0000 "application " | INHALATION_SPRAY | CUTANEOUS | Status: DC | PRN
  Filled 2019-09-15: qty 56

## 2019-09-15 MED ORDER — SENNOSIDES-DOCUSATE SODIUM 8.6-50 MG PO TABS
2.0000 | ORAL_TABLET | ORAL | Status: DC
  Administered 2019-09-16: 2 via ORAL
  Filled 2019-09-15: qty 2

## 2019-09-15 MED ORDER — FENTANYL-BUPIVACAINE-NACL 0.5-0.125-0.9 MG/250ML-% EP SOLN: 12.0000 mL/h | EPIDURAL | Status: DC | PRN

## 2019-09-15 MED ORDER — LIDOCAINE-EPINEPHRINE (PF) 2 %-1:200000 IJ SOLN
INTRAMUSCULAR | Status: AC
  Filled 2019-09-15: qty 20

## 2019-09-15 MED ORDER — PENICILLIN G 3 MILLION UNITS IVPB - SIMPLE MED: 3.0000 10*6.[IU] | INTRAVENOUS | Status: DC

## 2019-09-15 MED ORDER — DIBUCAINE (PERIANAL) 1 % EX OINT: 1.0000 "application " | TOPICAL_OINTMENT | CUTANEOUS | Status: DC | PRN

## 2019-09-15 MED ORDER — ZOLPIDEM TARTRATE 5 MG PO TABS: 5.0000 mg | ORAL_TABLET | Freq: Every evening | ORAL | Status: DC | PRN

## 2019-09-15 MED ORDER — PRENATAL MULTIVITAMIN CH
1.0000 | ORAL_TABLET | Freq: Every day | ORAL | Status: DC
  Administered 2019-09-16: 1 via ORAL
  Filled 2019-09-15: qty 1

## 2019-09-15 MED ORDER — EPHEDRINE SULFATE-NACL 50-0.9 MG/10ML-% IV SOSY
PREFILLED_SYRINGE | INTRAVENOUS | Status: DC | PRN
  Administered 2019-09-15: 10 mg via INTRAVENOUS

## 2019-09-15 MED ORDER — OXYTOCIN BOLUS FROM INFUSION: 500.0000 mL | Freq: Once | INTRAVENOUS | Status: DC

## 2019-09-15 MED ORDER — DIPHENHYDRAMINE HCL 25 MG PO CAPS: 25.0000 mg | ORAL_CAPSULE | Freq: Four times a day (QID) | ORAL | Status: DC | PRN

## 2019-09-15 MED ORDER — EPHEDRINE 5 MG/ML INJ
INTRAVENOUS | Status: AC
  Filled 2019-09-15: qty 10

## 2019-09-15 MED ORDER — PHENYLEPHRINE 40 MCG/ML (10ML) SYRINGE FOR IV PUSH (FOR BLOOD PRESSURE SUPPORT)
80.0000 ug | PREFILLED_SYRINGE | INTRAVENOUS | Status: DC | PRN
  Administered 2019-09-15: 80 ug via INTRAVENOUS

## 2019-09-15 MED ORDER — LACTATED RINGERS IV SOLN
INTRAVENOUS | Status: DC
  Administered 2019-09-15 (×4): via INTRAVENOUS

## 2019-09-15 MED ORDER — SODIUM CHLORIDE 0.9 % IV SOLN
5.0000 10*6.[IU] | Freq: Once | INTRAVENOUS | Status: AC
  Administered 2019-09-15: 5 10*6.[IU] via INTRAVENOUS
  Filled 2019-09-15: qty 5

## 2019-09-15 MED ORDER — ONDANSETRON HCL 4 MG PO TABS: 4.0000 mg | ORAL_TABLET | ORAL | Status: DC | PRN

## 2019-09-15 MED ORDER — FENTANYL CITRATE (PF) 100 MCG/2ML IJ SOLN
INTRAMUSCULAR | Status: DC | PRN
  Administered 2019-09-15: 100 ug via EPIDURAL

## 2019-09-15 MED ORDER — DIPHENHYDRAMINE HCL 50 MG/ML IJ SOLN: 12.5000 mg | INTRAMUSCULAR | Status: DC | PRN

## 2019-09-15 MED ORDER — LACTATED RINGERS IV SOLN: 500.0000 mL | INTRAVENOUS | Status: DC | PRN

## 2019-09-15 MED ORDER — FLEET ENEMA 7-19 GM/118ML RE ENEM: 1.0000 | ENEMA | RECTAL | Status: DC | PRN

## 2019-09-15 MED ORDER — FENTANYL CITRATE (PF) 100 MCG/2ML IJ SOLN: 25.0000 ug | INTRAMUSCULAR | Status: DC | PRN

## 2019-09-15 MED ORDER — ONDANSETRON HCL 4 MG/2ML IJ SOLN
4.0000 mg | Freq: Four times a day (QID) | INTRAMUSCULAR | Status: DC | PRN
  Administered 2019-09-15: 4 mg via INTRAVENOUS
  Filled 2019-09-15: qty 2

## 2019-09-15 MED ORDER — PHENYLEPHRINE 40 MCG/ML (10ML) SYRINGE FOR IV PUSH (FOR BLOOD PRESSURE SUPPORT)
80.0000 ug | PREFILLED_SYRINGE | INTRAVENOUS | Status: DC | PRN
  Filled 2019-09-15: qty 10

## 2019-09-15 MED ORDER — OXYCODONE-ACETAMINOPHEN 5-325 MG PO TABS: 2.0000 | ORAL_TABLET | ORAL | Status: DC | PRN

## 2019-09-15 MED ORDER — MEPERIDINE HCL 25 MG/ML IJ SOLN: 6.2500 mg | INTRAMUSCULAR | Status: DC | PRN

## 2019-09-15 MED ORDER — SOD CITRATE-CITRIC ACID 500-334 MG/5ML PO SOLN
30.0000 mL | ORAL | Status: DC | PRN
  Administered 2019-09-15: 30 mL via ORAL
  Filled 2019-09-15: qty 30

## 2019-09-15 MED ORDER — OXYTOCIN 40 UNITS IN NORMAL SALINE INFUSION - SIMPLE MED
1.0000 m[IU]/min | INTRAVENOUS | Status: DC
  Administered 2019-09-15: 2 m[IU]/min via INTRAVENOUS

## 2019-09-15 MED ORDER — LIDOCAINE HCL (PF) 1 % IJ SOLN
INTRAMUSCULAR | Status: DC | PRN
  Administered 2019-09-15 (×2): 5 mL via EPIDURAL

## 2019-09-15 MED ORDER — BUPIVACAINE HCL (PF) 0.25 % IJ SOLN
INTRAMUSCULAR | Status: AC
  Filled 2019-09-15: qty 30

## 2019-09-15 MED ORDER — SODIUM CHLORIDE (PF) 0.9 % IJ SOLN
INTRAMUSCULAR | Status: DC | PRN
  Administered 2019-09-15: 12 mL/h via EPIDURAL

## 2019-09-15 MED ORDER — ACETAMINOPHEN 325 MG PO TABS: 650.0000 mg | ORAL_TABLET | ORAL | Status: DC | PRN

## 2019-09-15 MED ORDER — OXYTOCIN 40 UNITS IN NORMAL SALINE INFUSION - SIMPLE MED
2.5000 [IU]/h | INTRAVENOUS | Status: DC
  Filled 2019-09-15: qty 1000

## 2019-09-15 MED ORDER — WITCH HAZEL-GLYCERIN EX PADS: 1.0000 "application " | MEDICATED_PAD | CUTANEOUS | Status: DC | PRN

## 2019-09-15 MED ORDER — SODIUM CHLORIDE 0.9 % IV SOLN: 5.0000 10*6.[IU] | INTRAVENOUS | Status: DC

## 2019-09-15 MED ORDER — EPHEDRINE 5 MG/ML INJ: 10.0000 mg | INTRAVENOUS | Status: DC | PRN

## 2019-09-15 MED ORDER — TERBUTALINE SULFATE 1 MG/ML IJ SOLN: 0.2500 mg | Freq: Once | INTRAMUSCULAR | Status: DC | PRN

## 2019-09-15 MED ORDER — FENTANYL-BUPIVACAINE-NACL 0.5-0.125-0.9 MG/250ML-% EP SOLN
12.0000 mL/h | EPIDURAL | Status: DC | PRN
  Filled 2019-09-15: qty 250

## 2019-09-15 MED ORDER — COCONUT OIL OIL: 1.0000 "application " | TOPICAL_OIL | Status: DC | PRN

## 2019-09-15 MED ORDER — ONDANSETRON HCL 4 MG/2ML IJ SOLN
INTRAMUSCULAR | Status: AC
  Filled 2019-09-15: qty 2

## 2019-09-15 MED ORDER — OXYCODONE-ACETAMINOPHEN 5-325 MG PO TABS: 1.0000 | ORAL_TABLET | ORAL | Status: DC | PRN

## 2019-09-15 MED ORDER — TETANUS-DIPHTH-ACELL PERTUSSIS 5-2.5-18.5 LF-MCG/0.5 IM SUSP: 0.5000 mL | Freq: Once | INTRAMUSCULAR | Status: DC

## 2019-09-15 MED ORDER — LACTATED RINGERS IV SOLN: 500.0000 mL | Freq: Once | INTRAVENOUS | Status: DC

## 2019-09-15 MED ORDER — LIDOCAINE-EPINEPHRINE 2 %-1:100000 IJ SOLN: INTRAMUSCULAR | Status: DC | PRN

## 2019-09-15 MED ORDER — DEXAMETHASONE SODIUM PHOSPHATE 10 MG/ML IJ SOLN
INTRAMUSCULAR | Status: DC | PRN
  Administered 2019-09-15: 10 mg via INTRAVENOUS

## 2019-09-15 MED ORDER — LACTATED RINGERS IV SOLN: INTRAVENOUS | Status: DC

## 2019-09-15 MED ORDER — LACTATED RINGERS IV SOLN
INTRAVENOUS | Status: DC | PRN
  Administered 2019-09-15: 18:00:00 via INTRAVENOUS

## 2019-09-15 MED ORDER — ONDANSETRON HCL 4 MG/2ML IJ SOLN
INTRAMUSCULAR | Status: DC | PRN
  Administered 2019-09-15: 4 mg via INTRAVENOUS

## 2019-09-15 SURGICAL SUPPLY — 23 items
ADH SKN CLS APL DERMABOND .7 (GAUZE/BANDAGES/DRESSINGS) ×1
CLOTH BEACON ORANGE TIMEOUT ST (SAFETY) ×3 IMPLANT
DERMABOND ADVANCED (GAUZE/BANDAGES/DRESSINGS) ×2
DERMABOND ADVANCED .7 DNX12 (GAUZE/BANDAGES/DRESSINGS) ×1 IMPLANT
DRSG OPSITE POSTOP 3X4 (GAUZE/BANDAGES/DRESSINGS) ×3 IMPLANT
DURAPREP 26ML APPLICATOR (WOUND CARE) ×3 IMPLANT
GLOVE BIOGEL PI IND STRL 7.0 (GLOVE) ×2 IMPLANT
GLOVE BIOGEL PI INDICATOR 7.0 (GLOVE) ×4
GLOVE ECLIPSE 7.0 STRL STRAW (GLOVE) ×6 IMPLANT
GOWN STRL REUS W/TWL LRG LVL3 (GOWN DISPOSABLE) ×6 IMPLANT
HOVERMATT SINGLE USE (MISCELLANEOUS) ×3 IMPLANT
NEEDLE HYPO 22GX1.5 SAFETY (NEEDLE) ×3 IMPLANT
NS IRRIG 1000ML POUR BTL (IV SOLUTION) ×3 IMPLANT
PACK ABDOMINAL MINOR (CUSTOM PROCEDURE TRAY) ×3 IMPLANT
PROTECTOR NERVE ULNAR (MISCELLANEOUS) ×3 IMPLANT
SPONGE LAP 4X18 RFD (DISPOSABLE) IMPLANT
SUT VIC AB 0 CT1 27 (SUTURE) ×3
SUT VIC AB 0 CT1 27XBRD ANBCTR (SUTURE) ×1 IMPLANT
SUT VICRYL 4-0 PS2 18IN ABS (SUTURE) ×3 IMPLANT
SYR CONTROL 10ML LL (SYRINGE) ×3 IMPLANT
TOWEL OR 17X24 6PK STRL BLUE (TOWEL DISPOSABLE) ×6 IMPLANT
TRAY FOLEY CATH SILVER 14FR (SET/KITS/TRAYS/PACK) ×3 IMPLANT
WATER STERILE IRR 1000ML POUR (IV SOLUTION) ×3 IMPLANT

## 2019-09-15 NOTE — Anesthesia Postprocedure Evaluation (Signed)
Anesthesia Post Note  Patient: Patricia [name]  Procedure(s) Performed: POST PARTUM TUBAL LIGATION (N/A )     Patient location during evaluation: PACU Anesthesia Type: Epidural Level of consciousness: awake and alert and oriented Pain management: pain level controlled Vital Signs Assessment: post-procedure vital signs reviewed and stable Respiratory status: spontaneous breathing, nonlabored ventilation and respiratory function stable Cardiovascular status: blood pressure returned to baseline Postop Assessment: epidural receding, no apparent nausea or vomiting, no headache and no backache Anesthetic complications: no    Last Vitals:  Vitals:   09/15/19 1930 09/15/19 1945  BP: 114/63 110/65  Pulse: 73 75  Resp: 17 (!) 21  Temp: 36.8 C 36.9 C  SpO2: 100% 100%    Last Pain:  Vitals:   09/15/19 1945  TempSrc:   PainSc: 4    Pain Goal:                Epidural/Spinal Function Cutaneous sensation: Able to Wiggle Toes (09/15/19 2000), Patient able to flex knees: Yes (09/15/19 2000), Patient able to lift hips off bed: Yes (09/15/19 2000), Back pain beyond tenderness at insertion site: No (09/15/19 2000), Progressively worsening motor and/or sensory loss: No (09/15/19 2000), Bowel and/or bladder incontinence post epidural: No (09/15/19 2000)  Brennan Bailey

## 2019-09-15 NOTE — MAU Note (Signed)
Pt reports contractions that started around 0200, every 2-8 mins. She denies LOF or vaginal bleeding. Reports good fetal movement. Cervix was 2.5cm on Thursday

## 2019-09-15 NOTE — Discharge Summary (Addendum)
Postpartum Discharge Summary     Patient Name: Patricia [name] DOB: 09-Dec-1990 MRN: [id number]  Date of admission: 09/15/2019 Delivering Provider: Gifford Shave   Date of discharge: 09/16/2019  Admitting diagnosis: 83 WKS, CTX Intrauterine pregnancy: [redacted]w[redacted]d    Secondary diagnosis:  Active Problems:   Obesity   Rh negative status during pregnancy   HSV-2 seropositive   History of preterm delivery   Postpartum depression   History of gestational diabetes   Supervision of normal pregnancy   Asymptomatic bacteriuria during pregnancy in first trimester   Unwanted fertility   GBS (group B Streptococcus carrier), +RV culture, currently pregnant   Normal labor   SVD (spontaneous vaginal delivery)  Additional problems: none     Discharge diagnosis: Term Pregnancy Delivered                                                                                                Post partum procedures:postpartum tubal ligation, Rhogam   Augmentation: AROM and Pitocin  Complications: None  Hospital course:  Onset of Labor With Vaginal Delivery     28y.o. yo [id number] 47w1das admitted in Active Labor on 09/15/2019. Patient had an uncomplicated labor course. Presented dilated to 8 cm. She was augmented with AROM and pitocin and progressed to complete with vaginal delivery complicated by retained placenta and cord avulsion. Patient received appropriate ppx for GBS carrier status. She underwent bilaterally tubal ligation which she tolerated well.    Membrane Rupture Time/Date: 2:53 PM ,09/15/2019   Intrapartum Procedures: Episiotomy: None [1]                                         Lacerations:  Labial [10]  Patient had a delivery of a Viable infant. 09/15/2019  Information for the patient's newborn:  MiSylwia, Cuervo0[[id number]]Delivery Method: Vaginal, Spontaneous(Filed from Delivery Summary)     Pateint had an uncomplicated postpartum course.  She is ambulating, tolerating a  regular diet, passing flatus, and urinating well. Patient is discharged home in stable condition on 09/16/19.  Delivery time: 4:09 PM    Magnesium Sulfate received: No BMZ received: No Rhophylac:No MMR:N/A Transfusion:No  Physical exam  Vitals:   09/15/19 2120 09/16/19 0033 09/16/19 0500 09/16/19 1454  BP: (!) 116/59 106/62 (!) 103/51 109/61  Pulse: 82 73 (!) 59 60  Resp: _0 Temp: 98.8 F (37.1 C) 98.3 F (36.8 C) 98.8 F (37.1 C) 98.2 F (36.8 C)  TempSrc: Oral Oral  Oral  SpO2:    100%  Weight:      Height:       General: alert, cooperative and no distress Lochia:normal flow Chest: CTAB Heart: RRR no m/r/g Abdomen: +BS, soft, nontender, dsg dry/intact Uterine Fundus: firm DVT Evaluation: No evidence of DVT seen on physical exam. Extremities: trace edema  Labs: Lab Results  Component Value Date   WBC 13.3 (H) 09/16/2019   HGB 9.6 (L) 09/16/2019   HCT 29.4 (  L) 09/16/2019   MCV 83.8 09/16/2019   PLT 202 09/16/2019   CMP Latest Ref Rng & Units 06/10/2018  Glucose 70 - 99 mg/dL 98  BUN 6 - 20 mg/dL 11  Creatinine 0.44 - 1.00 mg/dL 1.09(H)  Sodium 135 - 145 mmol/L 138  Potassium 3.5 - 5.1 mmol/L 4.1  Chloride 98 - 111 mmol/L 105  CO2 22 - 32 mmol/L 27  Calcium 8.9 - 10.3 mg/dL 8.8(L)  Total Protein 6.5 - 8.1 g/dL -  Total Bilirubin 0.3 - 1.2 mg/dL -  Alkaline Phos 38 - 126 U/L -  AST 15 - 41 U/L -  ALT 14 - 54 U/L -    Discharge instruction: per After Visit Summary and "Baby and Me Booklet".  After visit meds:  Allergies as of 09/16/2019   No Known Allergies     Medication List    STOP taking these medications   ferrous sulfate 325 (65 FE) MG tablet     TAKE these medications   acetaminophen 325 MG tablet Commonly known as: Tylenol Take 2 tablets (650 mg total) by mouth every 4 (four) hours as needed (for pain scale < 4). What changed:   medication strength  how much to take  when to take this  reasons to take this    acyclovir 400 MG tablet Commonly known as: ZOVIRAX Take 1 tablet (400 mg total) by mouth 3 (three) times daily.   Blood Pressure Monitor Misc For regular home bp monitoring during pregnancy   CitraNatal Assure 35-1 & 300 MG tablet One tablet and one capsule daily   cyclobenzaprine 10 MG tablet Commonly known as: FLEXERIL Take 1 tablet (10 mg total) by mouth every 8 (eight) hours as needed for muscle spasms.   oxyCODONE-acetaminophen 5-325 MG tablet Commonly known as: Percocet Take 1 tablet by mouth every 4 (four) hours as needed for up to 3 days for severe pain.       Diet: routine diet  Activity: Advance as tolerated. Pelvic rest for 6 weeks.   Outpatient follow up:4 weeks Follow up Appt: Future Appointments  Date Time Provider [place]  10/20/2019  2:30 PM Cresenzo-Dishmon, Joaquim Lai, CNM [facility]   Follow up Visit:   Please schedule this patient for Postpartum visit in: 6 weeks with the following provider: Any provider For C/S patients schedule nurse incision check in weeks 2 weeks: no Low risk pregnancy complicated by: none Delivery mode:  SVD Anticipated Birth Control:  BTL done PP PP Procedures needed: none  Schedule Integrated BH visit: no  Newborn Data: Live born female  Birth Weight: 3215 APGAR: 72, 9  Newborn Delivery   Birth date/time: 09/15/2019 16:09:00 Delivery type: Vaginal, Spontaneous      Baby Feeding: Breast Disposition:home with mother   09/16/2019 Alexis Frock, MD   I personally saw and evaluated the patient, performing the key elements of the service. I developed and verified the management plan that is described in the resident's/student's note, and I agree with the content with my edits above. VSS, HRR&R, Resp unlabored, Legs neg.  Nigel Berthold, CNM 09/18/2019 11:57 AM

## 2019-09-15 NOTE — Anesthesia Preprocedure Evaluation (Signed)
Anesthesia Evaluation  Patient identified by MRN, date of birth, ID band Patient awake    Reviewed: Allergy & Precautions, H&P , NPO status , Patient's Chart, lab work & pertinent test results  History of Anesthesia Complications Negative for: history of anesthetic complications  Airway Mallampati: II  TM Distance: >3 FB Neck ROM: full    Dental no notable dental hx. (+) Teeth Intact   Pulmonary neg pulmonary ROS,    Pulmonary exam normal breath sounds clear to auscultation       Cardiovascular negative cardio ROS Normal cardiovascular exam Rhythm:regular Rate:Normal     Neuro/Psych negative neurological ROS  negative psych ROS   GI/Hepatic negative GI ROS, Neg liver ROS,   Endo/Other  Morbid obesity  Renal/GU negative Renal ROS  negative genitourinary   Musculoskeletal   Abdominal   Peds  Hematology negative hematology ROS (+)   Anesthesia Other Findings   Reproductive/Obstetrics (+) Pregnancy                             Anesthesia Physical Anesthesia Plan  ASA: III  Anesthesia Plan: Epidural   Post-op Pain Management:    Induction:   PONV Risk Score and Plan:   Airway Management Planned:   Additional Equipment:   Intra-op Plan:   Post-operative Plan:   Informed Consent: I have reviewed the patients History and Physical, chart, labs and discussed the procedure including the risks, benefits and alternatives for the proposed anesthesia with the patient or authorized representative who has indicated his/her understanding and acceptance.       Plan Discussed with:   Anesthesia Plan Comments:         Anesthesia Quick Evaluation  

## 2019-09-15 NOTE — H&P (Addendum)
LABOR AND DELIVERY ADMISSION HISTORY AND PHYSICAL NOTE  Patricia [name] is a 28 y.o. female [phone] with IUP at [redacted]w[redacted]d by 6 wk [place] presenting for active labor.  No major concerns at this time.  Requests information on when she will be able to have her tubal done and if it will be done immediately after delivery.  Patient denies any recent headaches, change in vision, chest pain, shortness of breath, nausea, vomiting, diarrhea, constipation.  Acknowledges edema in lower extremities but not worsening.  She reports positive fetal movement. She denies leakage of fluid or vaginal bleeding.   She plans on breast feeding. She requests BTL for birth control.  Prenatal History/Complications: PNC at [facility] [place]:  @[redacted]w[redacted]d , CWD, normal anatomy, cephalic presentation, anterior placenta, 41%ile, EFW 3220U Pregnancy complications:  - HSV seropositive - Hx of PTD - Hx of PP depression - Hx of GDM - GBS positive  Past Medical History: Past Medical History:  Diagnosis Date  . Anemia   . Bacterial vaginosis   . Chlamydia   . Depression    PP depression after 3rd pregnancy - resolved with therarpy  . Migraine   . Preterm labor   . Preterm labor without delivery in second trimester June 2012   Positive FFN in June  . UTI (lower urinary tract infection)     Past Surgical History: Past Surgical History:  Procedure Laterality Date  . NO PAST SURGERIES      Obstetrical History: OB History    Gravida  5   Para  3   Term  2   Preterm  1   AB  1   Living  3     SAB  1   TAB      Ectopic      Multiple  0   Live Births  3           Social History: Social History   Socioeconomic History  . Marital status: Single    Spouse name: Not on file  . Number of children: 3  . Years of education: Not on file  . Highest education level: Not on file  Occupational History  . Not on file  Social Needs  . Financial resource strain: Not on file  . Food insecurity    Worry:  Not on file    Inability: Not on file  . Transportation needs    Medical: Not on file    Non-medical: Not on file  Tobacco Use  . Smoking status: Never Smoker  . Smokeless tobacco: Never Used  Substance and Sexual Activity  . Alcohol use: Not Currently    Comment: occasionally  . Drug use: No  . Sexual activity: Not Currently    Birth control/protection: None  Lifestyle  . Physical activity    Days per week: Not on file    Minutes per session: Not on file  . Stress: Not on file  Relationships  . Social Herbalist on phone: Not on file    Gets together: Not on file    Attends religious service: Not on file    Active member of club or organization: Not on file    Attends meetings of clubs or organizations: Not on file    Relationship status: Not on file  Other Topics Concern  . Not on file  Social History Narrative   Patricia [name] lives in [place] with her two sons (4 and 2yo). Her grandmother helps take care of  her children when she is at work. She was working at [employer] part-time during her pregnancy and plans to return to work 2 weeks post-partum. FOB lives close by and plans to be involved in Patricia [name]'s life.    Family History: Family History  Problem Relation Age of Onset  . Diabetes Other        great granmother  . Diabetes Paternal Grandfather   . Hypertension Paternal Grandfather   . Cancer Paternal Grandfather        prostate  . Miscarriages / Stillbirths Maternal Grandmother     Allergies: No Known Allergies  Medications Prior to Admission  Medication Sig Dispense Refill Last Dose  . acetaminophen (TYLENOL) 500 MG tablet Take 500 mg by mouth every 6 (six) hours as needed for mild pain.   Past Week at Unknown time  . acyclovir (ZOVIRAX) 400 MG tablet Take 1 tablet (400 mg total) by mouth 3 (three) times daily. 90 tablet 2 09/15/2019 at Unknown time  . cyclobenzaprine (FLEXERIL) 10 MG tablet Take 1 tablet (10 mg total) by mouth every 8 (eight) hours as  needed for muscle spasms. 20 tablet 0 09/13/2019  . ferrous sulfate 325 (65 FE) MG tablet Take 1 tablet (325 mg total) by mouth 2 (two) times daily with a meal. 60 tablet 3 09/15/2019 at Unknown time  . Prenat w/o A-FeCbGl-[id number]-FA-DHA (CITRANATAL ASSURE) 35-1 & 300 MG tablet One tablet and one capsule daily 60 tablet 11 09/15/2019 at Unknown time  . Blood Pressure Monitor MISC For regular home bp monitoring during pregnancy 1 each 0      [name]  All systems reviewed and negative except as stated in HPI  Physical Exam Blood pressure 105/68, pulse 96, temperature 98.6 F (37 C), temperature source Oral, resp. rate 16, height 5\' 7"  (1.702 m), weight (!) 149.7 kg, last menstrual period 12/08/2018, SpO2 98 %. General appearance: alert, oriented, NAD Lungs: normal respiratory effort Heart: regular rate Abdomen: soft, non-tender; gravid, leopold vertex  Extremities: No calf swelling or tenderness Presentation: cephalic by RN exam Fetal monitoringBaseline: 130 bpm, Variability: Good {> 6 bpm), Accelerations: Reactive and Decelerations: Absent Uterine activity: poor tracing Dilation: 8 Effacement (%): 90 Station: -2 Exam by:: J.Cox, RN  Prenatal labs: ABO, Rh: --/--/A NEG (10/19 0648)--> all priors A Negative, received rhogam this pregnancy Antibody: NEG (10/19 0648) Rubella: 9.47 (03/18 1043) RPR: Non Reactive (07/20 0858)  HBsAg: Negative (03/18 1043)  HIV: Non Reactive (07/20 0858)  GC/Chlamydia: neg/neg 9/24  GBS: --Lottie Dawson/Positive (09/24 1129)  2-hr GTT: normal 06/16/2019 Genetic screening:  normal Anatomy US: normal  Prenatal Transfer Tool  Maternal Diabetes: No Genetic Screening: Normal Maternal Ultrasounds/Referrals: Normal Fetal Ultrasounds or other Referrals:  None Maternal Substance Abuse:  No Significant Maternal Medications:  None Significant Maternal Lab Results: Group B Strep positive and Rh negative  Results for orders placed or performed during the hospital  encounter of 09/15/19 (from the past 24 hour(s))  SARS Coronavirus 2 by RT PCR (hospital order, [facility]) Nasopharyngeal Nasopharyngeal Swab   Collection Time: 09/15/19  6:25 AM   Specimen: Nasopharyngeal Swab  Result Value Ref Range   SARS Coronavirus 2 NEGATIVE NEGATIVE  CBC   Collection Time: 09/15/19  6:25 AM  Result Value Ref Range   WBC 9.2 4.0 - 10.5 K/uL   RBC 3.68 (L) 3.87 - 5.11 MIL/uL   Hemoglobin 9.9 (L) 12.0 - 15.0 g/dL   HCT 16.131.3 (L) 09.636.0 - 04.546.0 %  MCV 85.1 80.0 - 100.0 fL   MCH 26.9 26.0 - 34.0 pg   MCHC 31.6 30.0 - 36.0 g/dL   RDW 14.4 31.5 - 40.0 %   Platelets 207 150 - 400 K/uL   nRBC 0.0 0.0 - 0.2 %  Type and screen [facility]   Collection Time: 09/15/19  6:48 AM  Result Value Ref Range   ABO/RH(D) A NEG    Antibody Screen NEG    Sample Expiration 09/18/2019,2359    Weak D POS    Blood Bank Correction      Patient's Rh test results may represent a weak D or partial D. Performed at [facility], [address] [place], [place] [address]     Patient Active Problem List   Diagnosis Date Noted  . Normal labor 09/15/2019  . Unwanted fertility 09/04/2019  . GBS (group B Streptococcus carrier), +RV culture, currently pregnant 09/04/2019  . False labor before 37 completed weeks of gestation 08/18/2019  . Supervision of high risk pregnancy, antepartum 08/07/2019  . Spotting affecting pregnancy in second trimester 05/19/2019  . Asymptomatic bacteriuria during pregnancy in first trimester 02/14/2019  . Supervision of normal pregnancy 02/12/2019  . History of preterm delivery 11/08/2015  . 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 History of gestational diabetes 11/08/2015  . 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 Rh negative status during pregnancy 02/23/2015  . Obesity 04/01/2013    Assessment: Patricia [name] is a 28 y.o. [id number] at [redacted]w[redacted]d here for spontaneous labor.   #Labor: in active labor, expectant  management, hx of rapid labor #Pain: IV pain meds PRN, epidural upon maternal request #FWB: Cat I #GBS/ID:  Positive, start ampicillin and switch to penicillin #COVID: swab negative  #MOF: Breast #MOC: BTL, papers signed 06/16/2019 #Circ:  N/a, girl "Chloe"  #Rh negative: strange testing history, with most recent testing 02/12/2019 showing A+ blood type but all priors A-, has received rhogam this pregnancy. Blood bank has corrected previous lab entry. Will assess for need for rhogam post partum.    Derrel Nip 09/15/2019, 11:24 AM  Midwife attestation: I have seen and examined this patient; I agree with above documentation in the resident's note.   PE: Gen: calm comfortable, NAD Resp: normal effort and rate Abd: gravid  ROS, labs, PMH reviewed  Assessment/Plan: Patricia [name] is a 28 y.o. [phone] here for labor Admit to LD Labor: active FWB: Cat I GBS pos Anticipate labor progression and SVD  Donette Larry, CNM  09/15/2019, 1:30 PM

## 2019-09-15 NOTE — Progress Notes (Signed)
Labor Progress Note JURNEI LATINI is a 28 y.o. [phone] at [redacted]w[redacted]d presented for labor  S:  Comfortable with epidural. No c/o.   O:  BP 113/64   Pulse 79   Temp (P) 98.6 F (37 C) (Oral)   Resp 16   Ht 5\' 7"  (1.702 m)   Wt (!) 149.7 kg   LMP 12/08/2018 (Exact Date)   SpO2 98%   BMI 51.69 kg/m  EFM: baseline 125 bpm/ mod variability/ + accels/ no decels  Toco: indeterminable SVE: Dilation: 7.5 Effacement (%): 90 Cervical Position: Posterior Station: -2 Presentation: Vertex Exam by:: Henok Heacock, CNM  A/P: 28 y.o. [id number] [redacted]w[redacted]d  1. Labor: active 2. FWB: Cat I 3. Pain: epidural 4. GBS pos >PCN Continue expectant mngt, switch to PCN. Anticipate SVD.  Julianne Handler, CNM 10:52 AM

## 2019-09-15 NOTE — Anesthesia Procedure Notes (Signed)
Epidural Patient location during procedure: OB  Staffing Anesthesiologist: Jaceion Aday, MD Performed: anesthesiologist   Preanesthetic Checklist Completed: patient identified, site marked, surgical consent, pre-op evaluation, timeout performed, IV checked, risks and benefits discussed and monitors and equipment checked  Epidural Patient position: sitting Prep: DuraPrep Patient monitoring: heart rate, continuous pulse ox and blood pressure Approach: midline Location: L3-L4 Injection technique: LOR saline  Needle:  Needle type: Tuohy  Needle gauge: 17 G Needle length: 9 cm and 9 Needle insertion depth: 7 cm Catheter type: closed end flexible Catheter size: 20 Guage Catheter at skin depth: 11 cm Test dose: negative  Assessment Events: blood not aspirated, injection not painful, no injection resistance, negative IV test and no paresthesia  Additional Notes Patient identified. Risks/Benefits/Options discussed with patient including but not limited to bleeding, infection, nerve damage, paralysis, failed block, incomplete pain control, headache, blood pressure changes, nausea, vomiting, reactions to medication both or allergic, itching and postpartum back pain. Confirmed with bedside nurse the patient's most recent platelet count. Confirmed with patient that they are not currently taking any anticoagulation, have any bleeding history or any family history of bleeding disorders. Patient expressed understanding and wished to proceed. All questions were answered. Sterile technique was used throughout the entire procedure. Please see nursing notes for vital signs. Test dose was given through epidural needle and negative prior to continuing to dose epidural or start infusion. Warning signs of high block given to the patient including shortness of breath, tingling/numbness in hands, complete motor block, or any concerning symptoms with instructions to call for help. Patient was given  instructions on fall risk and not to get out of bed. All questions and concerns addressed with instructions to call with any issues.     

## 2019-09-15 NOTE — Transfer of Care (Signed)
Immediate Anesthesia Transfer of Care Note  Patient: Patricia [name]  Procedure(s) Performed: POST PARTUM TUBAL LIGATION (N/A )  Patient Location: PACU  Anesthesia Type:Epidural  Level of Consciousness: awake, alert  and oriented  Airway & Oxygen Therapy: Patient Spontanous Breathing  Post-op Assessment: Report given to RN and Post -op Vital signs reviewed and stable  Post vital signs: Reviewed and stable  Last Vitals:  Vitals Value Taken Time  BP 111/47 09/15/19 1854  Temp    Pulse 74 09/15/19 1858  Resp 21 09/15/19 1858  SpO2 99 % 09/15/19 1858  Vitals shown include unvalidated device data.  Last Pain:  Vitals:   09/15/19 1717  TempSrc: Oral  PainSc:          Complications: No apparent anesthesia complications

## 2019-09-15 NOTE — Anesthesia Preprocedure Evaluation (Signed)
Anesthesia Evaluation  Patient identified by MRN, date of birth, ID band Patient awake    Reviewed: Allergy & Precautions, H&P , NPO status , Patient's Chart, lab work & pertinent test results  History of Anesthesia Complications Negative for: history of anesthetic complications  Airway Mallampati: II  TM Distance: >3 FB Neck ROM: full    Dental no notable dental hx. (+) Teeth Intact   Pulmonary neg pulmonary ROS,    Pulmonary exam normal breath sounds clear to auscultation       Cardiovascular negative cardio ROS Normal cardiovascular exam Rhythm:regular Rate:Normal     Neuro/Psych negative neurological ROS  negative psych ROS   GI/Hepatic negative GI ROS, Neg liver ROS,   Endo/Other  Morbid obesity  Renal/GU negative Renal ROS  negative genitourinary   Musculoskeletal   Abdominal   Peds  Hematology negative hematology ROS (+)   Anesthesia Other Findings   Reproductive/Obstetrics (+) Pregnancy                             Anesthesia Physical  Anesthesia Plan  ASA: III  Anesthesia Plan: Epidural   Post-op Pain Management:    Induction:   PONV Risk Score and Plan: 2 and Treatment may vary due to age or medical condition  Airway Management Planned: Natural Airway  Additional Equipment:   Intra-op Plan:   Post-operative Plan:   Informed Consent: I have reviewed the patients History and Physical, chart, labs and discussed the procedure including the risks, benefits and alternatives for the proposed anesthesia with the patient or authorized representative who has indicated his/her understanding and acceptance.     Dental advisory given  Plan Discussed with:   Anesthesia Plan Comments:         Anesthesia Quick Evaluation

## 2019-09-15 NOTE — Op Note (Signed)
  Preoperative diagnosis:  Multiparous female who desires permanent sterilization  Postoperative diagnosis:  Same as above  Procedure:  Postpartum partum bilateral tubal ligation using modified Pomeroy technique  Surgeon:  Florian Buff  Asst: Barrington Ellison, MD  Anesthesia: Epidural  Findings:  Patient had a normal postpartum uterus tubes and ovaries.  Description of operation:  Patient was taken to the operating room where she had her epidural dosed.  She was then placed in the supine position.  She was then prepped and draped in the usual sterile fashion and a Foley catheter was placed in the bladder after the spinal was dosed.  A semilunar incision was made just below the umbilicus and taken down sharply to the fascia which was incised.  The peritoneum was then entered manually.  The right fallopian tube was identified including the fimbriated end.  The right fallopian tube was grasped and a 2-1/2 cm segment was removed using the modified Pomeroy technique.  There was good hemostasis.  Attention was then turned to the left fallopian tube which was identified including the fimbriated end.  A 2-1/2 cm segment in the distal isthmic and ampullary region portion of the tube was removed again using a modified Pomeroy technique.  There was good hemostasis.    The subcutaneous tissue fascia and peritoneum were closed using 0 vicryl in a running fashion.  The subcutaneous tissue was reapproximated with interrupted 3-0 Monocryl sutures.  The skin was closed using 4-0 Vicryl on a Keith needle in a subcuticular fashion.  Liquiban was placed for additional wound integrity and also to serve as a bandage.  The patient was taken to the recovery room in good stable condition.  All counts were correct.  Blood loss was minimal.    Chauncey Mann, MD 09/15/2019 6:46 PM

## 2019-09-15 NOTE — Progress Notes (Signed)
LABOR PROGRESS NOTE  Patricia [name] is a 28 y.o. [phone] at [redacted]w[redacted]d  admitted for SOL  Subjective: Resting comfortably in the room.  Reports she is hungry and ready to have a baby.  Objective: BP (!) 106/50   Pulse 87   Temp 98 F (36.7 C) (Oral)   Resp 16   Ht 5\' 7"  (1.702 m)   Wt (!) 149.7 kg   LMP 12/08/2018 (Exact Date)   SpO2 98%   BMI 51.69 kg/m  or  Vitals:   09/15/19 1302 09/15/19 1332 09/15/19 1402 09/15/19 1432  BP: 111/68 (!) 88/51 (!) 99/47 (!) 106/50  Pulse: 83 99 83 87  Resp:      Temp:      TempSrc:      SpO2:      Weight:      Height:       Dilation: 9 Effacement (%): 100 Cervical Position: Posterior Station: -1 Presentation: Vertex Exam by:: Dr. Inez Pilgrim FHT: baseline rate 140, moderate varibility, + acel, no decel Toco: 2-4 min   Labs: Lab Results  Component Value Date   WBC 9.2 09/15/2019   HGB 9.9 (L) 09/15/2019   HCT 31.3 (L) 09/15/2019   MCV 85.1 09/15/2019   PLT 207 09/15/2019    Patient Active Problem List   Diagnosis Date Noted  . Normal labor 09/15/2019  . Unwanted fertility 09/04/2019  . GBS (group B Streptococcus carrier), +RV culture, currently pregnant 09/04/2019  . False labor before 37 completed weeks of gestation 08/18/2019  . Supervision of high risk pregnancy, antepartum 08/07/2019  . Spotting affecting pregnancy in second trimester 05/19/2019  . Asymptomatic bacteriuria during pregnancy in first trimester 02/14/2019  . Supervision of normal pregnancy 02/12/2019  . History of preterm delivery 11/08/2015  . 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 History of gestational diabetes 11/08/2015  . 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 Rh negative status during pregnancy 02/23/2015  . Obesity 04/01/2013    Assessment / Plan: 28 y.o. [id number] at [redacted]w[redacted]d here for SOL  Labor: Progressing well. AROM on this check.  We will continue to monitor. Fetal Wellbeing: Category 1, reassuring Pain Control: Epidural in place Anticipated MOD:  Vaginal  Gifford Shave, MD  PGY-1, [facility]  09/15/2019, 2:59 PM

## 2019-09-16 LAB — CBC
HCT: 29.4 % — ABNORMAL LOW (ref 36.0–46.0)
Hemoglobin: 9.6 g/dL — ABNORMAL LOW (ref 12.0–15.0)
MCH: 27.4 pg (ref 26.0–34.0)
MCHC: 32.7 g/dL (ref 30.0–36.0)
MCV: 83.8 fL (ref 80.0–100.0)
Platelets: 202 10*3/uL (ref 150–400)
RBC: 3.51 MIL/uL — ABNORMAL LOW (ref 3.87–5.11)
RDW: 13.7 % (ref 11.5–15.5)
WBC: 13.3 10*3/uL — ABNORMAL HIGH (ref 4.0–10.5)
nRBC: 0 % (ref 0.0–0.2)

## 2019-09-16 LAB — KLEIHAUER-BETKE STAIN
# Vials RhIg: 1
Fetal Cells %: 0 %
Quantitation Fetal Hemoglobin: 0 mL

## 2019-09-16 MED ORDER — OXYCODONE-ACETAMINOPHEN 5-325 MG PO TABS: 1.0000 | ORAL_TABLET | ORAL | 0 refills | Status: AC | PRN

## 2019-09-16 MED ORDER — ACETAMINOPHEN 325 MG PO TABS: 650.0000 mg | ORAL_TABLET | ORAL | 0 refills | Status: DC | PRN

## 2019-09-16 MED ORDER — OXYCODONE HCL 5 MG PO TABS
5.0000 mg | ORAL_TABLET | ORAL | Status: DC | PRN
  Administered 2019-09-16: 5 mg via ORAL
  Filled 2019-09-16: qty 1

## 2019-09-16 MED ORDER — OXYCODONE HCL 5 MG PO TABS
10.0000 mg | ORAL_TABLET | ORAL | Status: DC | PRN
  Administered 2019-09-16: 10 mg via ORAL
  Filled 2019-09-16: qty 2

## 2019-09-16 MED ORDER — INFLUENZA VAC SPLIT QUAD 0.5 ML IM SUSY
0.5000 mL | PREFILLED_SYRINGE | INTRAMUSCULAR | Status: AC
  Administered 2019-09-16: 0.5 mL via INTRAMUSCULAR
  Filled 2019-09-16: qty 0.5

## 2019-09-16 MED ORDER — RHO D IMMUNE GLOBULIN 1500 UNIT/2ML IJ SOSY
300.0000 ug | PREFILLED_SYRINGE | Freq: Once | INTRAMUSCULAR | Status: AC
  Administered 2019-09-16: 300 ug via INTRAMUSCULAR
  Filled 2019-09-16: qty 2

## 2019-09-16 NOTE — Progress Notes (Signed)
CSW received consult for history of PPD. CSW met with MOB to offer support and complete assessment.    MOB resting in bed holding infant to chest, when CSW entered the room. MOB welcoming of CSW visit and was pleasant throughout interaction. CSW introduced self and explained reason for consult to which MOB expressed understanding. CSW inquired about MOB's mental health history and MOB acknowledged experiencing PPD after her last pregnancy. MOB reported symptoms of crying a lot and blaming herself and stated symptoms lasted for a few months. MOB shared she participated in counseling at that time and voiced interest in getting reestablished with a counselor. CSW provided MOB with a list of counseling resources in [facility] for MOB to follow up on. MOB shared with CSW that this has been her worst pregnancy due to life stressors going on during her pregnancy. MOB overall seemed to be in good-spirits and reported feeling pretty good now. MOB did acknowledge score of 10 on her Flavia Shipper but again attributed it to the life stressors going on during her pregnancy. MOB stated she and FOB feel very well-bonded to infant and are happy she is here. CSW provided education regarding the baby blues period vs. perinatal mood disorders, discussed treatment and gave resources for mental health follow up if concerns arise. CSW recommends self-evaluation during the postpartum time period using the New Mom Checklist from Postpartum Progress and encouraged MOB to contact a medical professional if symptoms are noted at any time. MOB did not appear to be displaying any acute mental health symptoms and denied any current SI, HI or DV. MOB reported feeling well-supported by her mother and grandmother.   MOB confirmed having all essential items for infant once discharged and reported infant would be sleeping in a bassinet once home. CSW provided review of Sudden Infant Death Syndrome (SIDS) precautions and safe sleeping habits.     CSW identifies no further need for intervention and no barriers to discharge at this time.  Elijio Miles, Elsie  [facility] [employer] [phone]

## 2019-09-16 NOTE — Lactation Note (Signed)
This note was copied from a baby's chart. Lactation Consultation Note Baby 37 hrs old. Mom states she is going to exclusively formula feed. Mom states its hard on her to BF. Mom has inverted nipples. States she prefers to formula feed. Mom states she BF her 1st child 2 weeks, her 2nd child 3 days and her 61rd child now 27 yrs old for 1 1/2 yrs off and on. ? Mom states she isn't going to BF this baby. Encouraged mom if she changes her mind to call for assistance if needed. Lactation brochure left at bedside.  Patient Name: Patricia [name] Today's Date: 09/16/2019 Reason for consult: Initial assessment   Maternal Data Does the patient have breastfeeding experience prior to this delivery?: Yes  Feeding    LATCH Score                   Interventions    Lactation Tools Discussed/Used WIC Program: Yes   Consult Status Consult Status: Complete Date: 09/16/19    Theodoro Kalata 09/16/2019, 1:55 AM

## 2019-09-16 NOTE — Progress Notes (Signed)
Post Partum Day 1 Subjective: no complaints, up ad lib, voiding and tolerating PO, small lochia, plans to bottle feed, bilateral tubal ligation done yesterday  Objective: Blood pressure (!) 103/51, pulse (!) 59, temperature 98.8 F (37.1 C), resp. rate 16, height 5\' 7"  (1.702 m), weight (!) 149.7 kg, last menstrual period 12/08/2018, SpO2 100 %, unknown if currently breastfeeding.  Physical Exam:  General: alert, cooperative and no distress Lochia:normal flow Chest: CTAB Heart: RRR no m/r/g Abdomen: +BS, soft, nontender, dsg dry/intact Uterine Fundus: firm DVT Evaluation: No evidence of DVT seen on physical exam. Extremities: trace edema  Recent Labs    09/15/19 0625  HGB 9.9*  HCT 31.3*    Assessment/Plan: Plan for discharge tomorrow   LOS: 1 day   Christin Fudge 09/16/2019, 7:27 AM

## 2019-09-16 NOTE — Anesthesia Postprocedure Evaluation (Signed)
Anesthesia Post Note  Patient: Patricia [name]  Procedure(s) Performed: AN AD HOC LABOR EPIDURAL     Patient location during evaluation: Mother Baby Anesthesia Type: Epidural Level of consciousness: awake Pain management: satisfactory to patient Vital Signs Assessment: post-procedure vital signs reviewed and stable Respiratory status: spontaneous breathing Cardiovascular status: stable Anesthetic complications: no    Last Vitals:  Vitals:   09/16/19 0033 09/16/19 0500  BP: 106/62 (!) 103/51  Pulse: 73 (!) 59  Resp: 20 16  Temp: 36.8 C 37.1 C  SpO2:      Last Pain:  Vitals:   09/16/19 0725  TempSrc:   PainSc: 4    Pain Goal:                   [employer]

## 2019-09-17 ENCOUNTER — Ambulatory Visit (INDEPENDENT_AMBULATORY_CARE_PROVIDER_SITE_OTHER): Payer: BC Managed Care – PPO | Admitting: Obstetrics and Gynecology

## 2019-09-17 ENCOUNTER — Encounter (HOSPITAL_COMMUNITY): Payer: Self-pay | Admitting: Obstetrics & Gynecology

## 2019-09-17 ENCOUNTER — Other Ambulatory Visit: Payer: Self-pay

## 2019-09-17 VITALS — BP 97/60 | HR 70 | Ht 67.0 in | Wt 327.4 lb

## 2019-09-17 DIAGNOSIS — Z5189 Encounter for other specified aftercare: Secondary | ICD-10-CM | POA: Insufficient documentation

## 2019-09-17 DIAGNOSIS — Z9889 Other specified postprocedural states: Secondary | ICD-10-CM

## 2019-09-17 LAB — RH IG WORKUP (INCLUDES ABO/RH)
ABO/RH(D): A NEG
Gestational Age(Wks): 40
Unit division: 0

## 2019-09-17 LAB — SURGICAL PATHOLOGY

## 2019-09-17 NOTE — Progress Notes (Signed)
Patient ID: Patricia [name], female   DOB: February 18, 1991, 28 y.o.   MRN: [id number]  Subjective:  Patricia [name] is a 28 y.o.  W/I female now 27 days status post postpartum tubal ligation.   When mother took off bandages she said it looked as if incision was open  [name] Negative except.  No fever no drainage   Diet:   Regular regular   Bowel movements : normal.  Pain is controlled with current analgesics. Medications being used: ibuprofen (OTC).  Objective:  BP 97/60 (BP Location: Right Arm, Patient Position: Sitting, Cuff Size: Large)    Pulse 70    Ht 5\' 7"  (1.702 m)    Wt (!) 327 lb 6.4 oz (148.5 kg)    LMP 12/08/2018 (Exact Date)    Breastfeeding No    BMI 51.28 kg/m  General:Well developed, well nourished.  No acute distress. Abdomen: Bowel sounds normal, soft, non-tender.  The subumbilical transverse 2 cm incision is separated superficially to a depth of approximately 4 mm.  It does not appear erythematous and and the surrounding tissues appear healthy.  The glue separated along the incision line. I suspect it came off as there was lots of difficulty getting her large 4 inch bandage off.  I suspect that the choice of this particularly sticky Band-Aid placed too much traction on the glue and separated it along the incision line. Pelvic Exam: Not done  Incision(s):  Superficial separation of skin edges, no drainage, no hernia, no swelling, no dehiscence the wound is addressed by using benzoin to prep the skin above and below and using for 1/4 inch halves of Steri-Strips to reapproximate the skin edges.  2 standard Band-Aids are placed at the lateral edges of the incision in an up-and-down position to reduce traction and tension on the incision line and still allow visualization of the incision.  Patient is encouraged to avoid showering for 48 hours, to leave the Steri-Strips in place, and to be careful when replacement of any future Band-Aids  Assessment:  Post-Op 3 days s/p  postpartum tubal ligation   Superficial separation of skin edges   Plan:  1. Edges sealed again with super glue 2. . current medications.neosporin 3. Activity restrictions: no bending, stooping, or squatting 4. return to work: not applicable. 5. Follow up in 1 Ibuprofen  By signing my name below, I, Samul Dada, attest that this documentation has been prepared under the direction and in the presence of Jonnie Kind, MD. Electronically Signed: [place]. 09/17/19. 11:57 AM.  >jfs I personally performed the services described in this documentation, which was SCRIBED in my presence. The recorded information has been reviewed and considered accurate. It has been edited as necessary during review. Jonnie Kind, MD

## 2019-09-18 ENCOUNTER — Other Ambulatory Visit: Payer: BC Managed Care – PPO | Admitting: Obstetrics and Gynecology

## 2019-09-29 ENCOUNTER — Telehealth: Payer: Self-pay | Admitting: *Deleted

## 2019-09-29 NOTE — Telephone Encounter (Signed)
Anderson Malta RN postpartum nurse called to inform us that patient scored 21/30 on edinburgh. She has a history of pp depression and is not currently on meds. She is caring for herself and the baby. No thoughts of self harm.

## 2019-09-29 NOTE — Telephone Encounter (Signed)
I called Patricia [name] and got her an appointment tomorrow with Knute Neu to discuss pp depression. Pt agreeable.

## 2019-09-30 ENCOUNTER — Other Ambulatory Visit: Payer: Self-pay | Admitting: *Deleted

## 2019-09-30 ENCOUNTER — Telehealth (INDEPENDENT_AMBULATORY_CARE_PROVIDER_SITE_OTHER): Payer: BC Managed Care – PPO | Admitting: Women's Health

## 2019-09-30 ENCOUNTER — Encounter: Payer: Self-pay | Admitting: Women's Health

## 2019-09-30 VITALS — BP 118/67

## 2019-09-30 DIAGNOSIS — F53 Postpartum depression: Secondary | ICD-10-CM

## 2019-09-30 DIAGNOSIS — Z20822 Contact with and (suspected) exposure to covid-19: Secondary | ICD-10-CM

## 2019-09-30 DIAGNOSIS — O99345 Other mental disorders complicating the puerperium: Secondary | ICD-10-CM

## 2019-09-30 NOTE — Progress Notes (Signed)
   TELEHEALTH VIRTUAL GYN VISIT ENCOUNTER NOTE Patient name: Patricia [name] MRN [id number]  Date of birth: May 05, 1991  I connected with patient on 09/30/19 at  2:10 PM EST by MyChart video  and verified that I am speaking with the correct person using two identifiers.  Due to COVID-19 recommendations, pt is not currently in the office.    I discussed the limitations, risks, security and privacy concerns of performing an evaluation and management service by telephone and the availability of in person appointments. I also discussed with the patient that there may be a patient responsible charge related to this service. The patient expressed understanding and agreed to proceed.   Chief Complaint:   Depression  History of Present Illness:   Patricia [name] is a 28 y.o. [phone] African American female 2wks s/p SVB and postpartum bTL being evaluated today for postpartum depression. H/O same w/ 1 other child. Denies SI/HI/II. EPDS 23. Does not want meds. Wants therapy at [facility] in [place]. Bottlefeeding. Wants to go back to work light-duty this week, thinks it will help. Works at [employer]. BTL incision healing well.      Patient's last menstrual period was 12/08/2018 (exact date). The current method of family planning is tubal ligation.  [name]:   Pertinent items are noted in HPI Denies fever/chills, dizziness, headaches, visual disturbances, fatigue, shortness of breath, chest pain, abdominal pain, vomiting, abnormal vaginal discharge/itching/odor/irritation, problems with periods, bowel movements, urination, or intercourse unless otherwise stated above.  Pertinent History Reviewed:  Reviewed past medical,surgical, social, obstetrical and family history.  Reviewed problem list, medications and allergies. Physical Assessment:   Vitals:   09/30/19 1425  BP: 118/67  There is no height or weight on file to calculate BMI.       Physical Examination:   General:  Alert, oriented and  cooperative.   Mental Status: Normal mood and affect perceived. Normal judgment and thought content.  Physical exam deferred due to nature of the encounter Abd: incision healing well  No results found for this or any previous visit (from the past 24 hour(s)).  Assessment & Plan:  1) PPD> 2wks s/p SVB, referral to [facility] ordered, pt declined meds. To let us know if decides for meds/doesn't hear from referral   Meds: No orders of the defined types were placed in this encounter.   Orders Placed This Encounter  Procedures  . Ambulatory referral to [facility]    I discussed the assessment and treatment plan with the patient. The patient was provided an opportunity to ask questions and all were answered. The patient agreed with the plan and demonstrated an understanding of the instructions.   The patient was advised to call back or seek an in-person evaluation/go to the ED if the symptoms worsen or if the condition fails to improve as anticipated.  I provided 15 minutes of non-face-to-face time during this encounter.   Return for As scheduled 11/23 for PPV.  [place], [facility] 09/30/2019 2:51 PM

## 2019-10-01 LAB — NOVEL CORONAVIRUS, NAA: SARS-CoV-2, NAA: NOT DETECTED

## 2019-10-20 ENCOUNTER — Encounter: Payer: Self-pay | Admitting: *Deleted

## 2019-10-20 ENCOUNTER — Telehealth (INDEPENDENT_AMBULATORY_CARE_PROVIDER_SITE_OTHER): Payer: BC Managed Care – PPO | Admitting: Advanced Practice Midwife

## 2019-10-20 ENCOUNTER — Encounter: Payer: Self-pay | Admitting: Advanced Practice Midwife

## 2019-10-20 ENCOUNTER — Other Ambulatory Visit: Payer: Self-pay

## 2019-10-20 NOTE — Progress Notes (Signed)
TELEHEALTH VIRTUAL POSTPARTUM VISIT ENCOUNTER NOTE  I connected with@ on 10/20/19 at  2:30 PM EST by telephone at home and verified that I am speaking with the correct person using two identifiers.   I discussed the limitations, risks, security and privacy concerns of performing an evaluation and management service by telephone and the availability of in person appointments. I also discussed with the patient that there may be a patient responsible charge related to this service. The patient expressed understanding and agreed to proceed.  Appointment Date: 10/20/2019  OBGYN Clinic: [facility]  Chief Complaint:  Postpartum Visit  History of Present Illness: KINZLEE SELVY is a 28 y.o. African-American [id number] (No LMP recorded. (Menstrual status: Other).), seen for the above chief complaint. Her past medical history is significant for depression.   She is s/p normal spontaneous vaginal delivery on 09/15/19 at 40.1 weeks; she was discharged to home on PPD#2. BTL on DOD. Pregnancy complicated by intrapartum GHTN, resolved after birth.Pecola Leisure is doing well.  Complains of PPD. She did a video visit with Genella Rife on 11/3 for PPD, wanted Referal to [facility] (no particular reason she chose them, fine w/being referred somewhere else) and did not want medications. She had PPD after her last child. 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 Has good support system in place.   Vaginal bleeding or discharge: No  Mode of feeding infant: Bottle Intercourse: No  Contraception: bilateral tubal ligation PP depression s/s: Yes .  Any bowel or bladder issues: No  Pap smear: no abnormalities (date: 3/20)  [name]: Positive for depression.  She denies SI/HI. Her 12 point review of systems is negative or as noted in the History of Present Illness. BPs normal at home  since delivery.  Patient Active Problem List   Diagnosis Date Noted  . History of preterm delivery 11/08/2015  . 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 History of gestational diabetes 11/08/2015  . 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 Obesity 04/01/2013    Medications Azriel D. Gladden had no medications administered during this visit. Current Outpatient Medications  Medication Sig Dispense Refill  . acyclovir (ZOVIRAX) 400 MG tablet Take 1 tablet (400 mg total) by mouth 3 (three) times daily. 90 tablet 2  . Prenat w/o A-[id number]-[id number]-DHA (CITRANATAL ASSURE) 35-1 & 300 MG tablet One tablet and one capsule daily 60 tablet 11   No current facility-administered medications for this visit.     Allergies Patient has no known allergies.  Physical Exam:  General:  Alert, oriented and cooperative.   Mental Status: Depressed mood and normal affect perceived. Normal judgment and thought content.  Rest of physical exam deferred due to type of encounter  PP Depression Screening:   Edinburgh Postnatal Depression Scale - 10/20/19 1403      Edinburgh Postnatal Depression Scale:  In the Past 7 Days   I have been able to laugh and see the funny side of things.  1    I have looked forward with enjoyment to things.  1    I have blamed myself unnecessarily when things went wrong.  3    I have been anxious or worried for no good reason.  2    I have felt scared or panicky for no good reason.  2    Things have been getting on top  of me.  3    I have been so unhappy that I have had difficulty sleeping.  2    I have felt sad or miserable.  3    I have been so unhappy that I have been crying.  3    The thought of harming myself has occurred to me.  0    Edinburgh Postnatal Depression Scale Total  20       Assessment:Patient is a 28 y.o. [id number] who is 4 weeks postpartum from a normal spontaneous vaginal delivery. Doing well other than PPD.  Plan: PP visit, normal PPD:  Has not heard from referral  K. Booker sent to [facility], so referral will be sent to [facility].    Pt's number is [phone]  I discussed the assessment and treatment plan with the patient. The patient was provided an opportunity to ask questions and all were answered. The patient agreed with the plan and demonstrated an understanding of the instructions.   The patient was advised to call back or seek an in-person evaluation/go to the ED for any concerning postpartum symptoms.  I provided 20 minutes of non-face-to-face time during this encounter.   Christin Fudge, [place] for [employer], [place]

## 2019-10-21 ENCOUNTER — Encounter: Payer: Self-pay | Admitting: Advanced Practice Midwife

## 2019-10-22 ENCOUNTER — Telehealth: Payer: Self-pay | Admitting: Adult Health

## 2019-10-22 MED ORDER — ESCITALOPRAM OXALATE 10 MG PO TABS: 10.0000 mg | ORAL_TABLET | Freq: Every day | ORAL | 2 refills | Status: DC

## 2019-10-22 MED ORDER — HYDROXYZINE HCL 10 MG PO TABS: 10.0000 mg | ORAL_TABLET | Freq: Three times a day (TID) | ORAL | 2 refills | Status: DC | PRN

## 2019-10-22 NOTE — Telephone Encounter (Signed)
Pt is having depression and blaming her self for stuff and crying, has gone back to work part time. Pt is not breastfeeding, had SVD 09/15/19.Denies having SI or HI. Rx lexapro 10 mg take 1 daily, vistaril 10 mg 1 every 8 hours pan anxiety.follow up with me in 2 weeks  If has any thoughts of hurting self go to ER.

## 2019-11-05 ENCOUNTER — Other Ambulatory Visit: Payer: Self-pay

## 2019-11-05 ENCOUNTER — Encounter: Payer: Self-pay | Admitting: Adult Health

## 2019-11-05 ENCOUNTER — Ambulatory Visit (INDEPENDENT_AMBULATORY_CARE_PROVIDER_SITE_OTHER): Payer: BC Managed Care – PPO | Admitting: Adult Health

## 2019-11-05 VITALS — BP 106/67 | HR 73 | Ht 67.0 in | Wt 326.0 lb

## 2019-11-05 DIAGNOSIS — F53 Postpartum depression: Secondary | ICD-10-CM

## 2019-11-05 DIAGNOSIS — O99345 Other mental disorders complicating the puerperium: Secondary | ICD-10-CM | POA: Diagnosis not present

## 2019-11-05 MED ORDER — ESCITALOPRAM OXALATE 10 MG PO TABS: 10.0000 mg | ORAL_TABLET | Freq: Every day | ORAL | 6 refills | Status: DC

## 2019-11-05 MED ORDER — HYDROXYZINE HCL 10 MG PO TABS: 10.0000 mg | ORAL_TABLET | Freq: Three times a day (TID) | ORAL | 6 refills | Status: DC | PRN

## 2019-11-05 NOTE — Progress Notes (Signed)
  Subjective:     Patient ID: Patricia [name], female   DOB: 01/02/1991, 28 y.o.   MRN: [id number]  HPI Patricia [name] is a 28 year old black female, [id number] back in follow up on starting lexapro and doing much better, not as angry and is smiling today.  [name] Feels much better, not as angry Reviewed past medical,surgical, social and family history. Reviewed medications and allergies.     Objective:   Physical Exam BP 106/67 (BP Location: Left Arm, Patient Position: Sitting, Cuff Size: Large)   Pulse 73   Ht 5\' 7"  (1.702 m)   Wt (!) 326 lb (147.9 kg)   LMP 10/30/2019   BMI 51.06 kg/m   Skin warm and dry.  Lungs: clear to ausculation bilaterally. Cardiovascular: regular rate and rhythm. PHQ 9 score is 6, denies any SI, is on lexapro,    Assessment:     1. Postpartum depression       Plan:     Continue lexapro and vistaril(as needed)    Meds ordered this encounter  Medications  . escitalopram (LEXAPRO) 10 MG tablet    Sig: Take 1 tablet (10 mg total) by mouth daily.    Dispense:  30 tablet    Refill:  6    Order Specific Question:   Supervising Provider    Answer:   Elonda Husky, LUTHER H [[id number]]  . hydrOXYzine (ATARAX/VISTARIL) 10 MG tablet    Sig: Take 1 tablet (10 mg total) by mouth 3 (three) times daily as needed.    Dispense:  30 tablet    Refill:  6    Order Specific Question:   Supervising Provider    Answer:   Tania Ade H [[id number]]  Follow up in 3 months or sooner if needed

## 2020-02-03 ENCOUNTER — Ambulatory Visit: Payer: BC Managed Care – PPO | Admitting: Adult Health

## 2020-03-06 ENCOUNTER — Other Ambulatory Visit: Payer: Self-pay

## 2020-03-06 ENCOUNTER — Ambulatory Visit: Payer: BC Managed Care – PPO | Attending: Internal Medicine

## 2020-03-06 DIAGNOSIS — Z23 Encounter for immunization: Secondary | ICD-10-CM

## 2020-03-06 NOTE — Progress Notes (Signed)
   Covid-19 Vaccination Clinic  Name:  TANGALA WIEGERT    MRN: [id number] DOB: 1991/08/12  03/06/2020  Ms. Mezquita was observed post Covid-19 immunization for 15 minutes without incident. She was provided with Vaccine Information Sheet and instruction to access the V-Safe system.   Ms. Damman was instructed to call 911 with any severe reactions post vaccine: Marland Kitchen Difficulty breathing  . Swelling of face and throat  . A fast heartbeat  . A bad rash all over body  . Dizziness and weakness   Immunizations Administered    Name Date Dose VIS Date Route   Pfizer COVID-19 Vaccine 03/06/2020  8:37 AM 0.3 mL 11/07/2019 Intramuscular   Manufacturer: [employer], [employer]   Lot: [id number]   NDC: [address]-9

## 2020-03-31 ENCOUNTER — Ambulatory Visit: Payer: BC Managed Care – PPO | Attending: Internal Medicine

## 2020-03-31 ENCOUNTER — Other Ambulatory Visit: Payer: Self-pay

## 2020-03-31 DIAGNOSIS — Z23 Encounter for immunization: Secondary | ICD-10-CM

## 2020-03-31 NOTE — Progress Notes (Signed)
   Covid-19 Vaccination Clinic  Name:  Patricia [name]    MRN: [id number] DOB: May 16, 1991  03/31/2020  [name] was observed post Covid-19 immunization for 15 minutes without incident. She was provided with Vaccine Information Sheet and instruction to access the V-Safe system.   [name] was instructed to call 911 with any severe reactions post vaccine: Marland Kitchen Difficulty breathing  . Swelling of face and throat  . A fast heartbeat  . A bad rash all over body  . Dizziness and weakness   Immunizations Administered    Name Date Dose VIS Date Route   Pfizer COVID-19 Vaccine 03/31/2020  9:24 AM 0.3 mL 01/21/2019 Intramuscular   Manufacturer: [employer], [employer]   Lot: [id number]   NDC: [address]-2

## 2020-04-02 ENCOUNTER — Other Ambulatory Visit: Payer: Self-pay

## 2020-04-02 ENCOUNTER — Ambulatory Visit
Admission: EM | Admit: 2020-04-02 | Discharge: 2020-04-02 | Disposition: A | Discharge status: Home / Self Care | Payer: BC Managed Care – PPO | Attending: Emergency Medicine | Admitting: Emergency Medicine

## 2020-04-02 DIAGNOSIS — J302 Other seasonal allergic rhinitis: Secondary | ICD-10-CM

## 2020-04-02 MED ORDER — FLUTICASONE PROPIONATE 50 MCG/ACT NA SUSP: 1.0000 | Freq: Every day | NASAL | 0 refills | Status: DC

## 2020-04-02 MED ORDER — PREDNISONE 10 MG PO TABS: 20.0000 mg | ORAL_TABLET | Freq: Every day | ORAL | 0 refills | Status: DC

## 2020-04-02 MED ORDER — CETIRIZINE HCL 10 MG PO TABS: 10.0000 mg | ORAL_TABLET | Freq: Every day | ORAL | 0 refills | Status: DC

## 2020-04-02 NOTE — Discharge Instructions (Signed)
Rest push fluids Return or follow up with PCP in 24 hours to be reevaluated and to ensure your symptoms are improving Flonase, Zyrtec, and prednisone were prescribed Return sooner or go to the ED if you have any new or worsening symptoms such as difficulty breathing, shortness of breath, chest pain, nausea, vomiting, throat tightness or swelling, tongue swelling or tingling, worsening lip or facial swelling, abdominal pain, changes in bowel or bladder habits, no improvement despite medications, etc..Marland Kitchen

## 2020-04-02 NOTE — ED Provider Notes (Signed)
[place]   [id number] 04/02/20 Arrival Time: 1320  Cc: Allergic reaction  SUBJECTIVE:  Patricia [name] is a 29 y.o. female who presents with sinus drainage and rhinorrhea for the past few days.  Reports he has tried OTC nasal spray without relief.  Denies precipitating event, exposure, known allergy or trigger. Denies changes in medication or starting a new medication.  Report previous symptoms in the past.   Denies fever, chills, nausea, vomiting, erythema, redness, swollen glands, oral manifestations such as throat swelling/ tingling, mouth swelling/ tingling, tongue swelling/tingling, dyspnea, SOB, chest pain, abdominal pain, changes in bowel or bladder function.     ROS: As per HPI.  All other pertinent ROS negative.     Past Medical History:  Diagnosis Date  . Anemia   . Bacterial vaginosis   . Chlamydia   . Depression    PP depression after 3rd pregnancy - resolved with therarpy  . Migraine   . Preterm labor   . Preterm labor without delivery in second trimester June 2012   Positive FFN in June  . UTI (lower urinary tract infection)    Past Surgical History:  Procedure Laterality Date  . NO PAST SURGERIES    . TUBAL LIGATION N/A 09/15/2019   Procedure: POST PARTUM TUBAL LIGATION;  Surgeon: Florian Buff, MD;  Location: [facility];  Service: Gynecology;  Laterality: N/A;   No Known Allergies No current facility-administered medications on file prior to encounter.   Current Outpatient Medications on File Prior to Encounter  Medication Sig Dispense Refill  . acyclovir (ZOVIRAX) 400 MG tablet Take 1 tablet (400 mg total) by mouth 3 (three) times daily. 90 tablet 2  . escitalopram (LEXAPRO) 10 MG tablet Take 1 tablet (10 mg total) by mouth daily. 30 tablet 6  . hydrOXYzine (ATARAX/VISTARIL) 10 MG tablet Take 1 tablet (10 mg total) by mouth 3 (three) times daily as needed. 30 tablet 6  . Prenat w/o A-FeCbGl-DSS-FA-DHA (CITRANATAL ASSURE) 35-1 & 300 MG tablet One  tablet and one capsule daily 60 tablet 11    Social History   Socioeconomic History  . Marital status: Single    Spouse name: Not on file  . Number of children: 4  . Years of education: Not on file  . Highest education level: Not on file  Occupational History  . Not on file  Tobacco Use  . Smoking status: Never Smoker  . Smokeless tobacco: Never Used  Substance and Sexual Activity  . Alcohol use: Not Currently    Comment: occasionally  . Drug use: No  . Sexual activity: Not Currently    Birth control/protection: Surgical    Comment: tubal ligation  Other Topics Concern  . Not on file  Social History Narrative   Patricia [name] lives in [place] with her two sons (4 and 2yo). Her grandmother helps take care of her children when she is at work. She was working at [employer] part-time during her pregnancy and plans to return to work 2 weeks post-partum. FOB lives close by and plans to be involved in [name]'s life.   Social Determinants of Health   Financial Resource Strain:   . Difficulty of Paying Living Expenses:   Food Insecurity:   . Worried About Charity fundraiser in the Last Year:   . Arboriculturist in the Last Year:   Transportation Needs:   . Film/video editor (Medical):   Marland Kitchen Lack of Transportation (Non-Medical):   Physical Activity:   .  Days of Exercise per Week:   . Minutes of Exercise per Session:   Stress:   . Feeling of Stress :   Social Connections:   . Frequency of Communication with Friends and Family:   . Frequency of Social Gatherings with Friends and Family:   . Attends Religious Services:   . Active Member of Clubs or Organizations:   . Attends Banker Meetings:   Marland Kitchen Marital Status:   Intimate Partner Violence:   . Fear of Current or Ex-Partner:   . Emotionally Abused:   Marland Kitchen Physically Abused:   . Sexually Abused:    Family History  Problem Relation Age of Onset  . Diabetes Other        great granmother  . Diabetes Paternal  Grandfather   . Hypertension Paternal Grandfather   . Cancer Paternal Grandfather        prostate  . Miscarriages / Stillbirths Maternal Grandmother      OBJECTIVE:  Vitals:   04/02/20 1333  BP: 124/88  Pulse: 89  Resp: 16  Temp: 98.8 F (37.1 C)  TempSrc: Oral  SpO2: 98%     General appearance: Alert, speaking in full sentences without difficulty HEENT:NCAT; no obvious facial swelling; Ears: EACs clear, TMs pearly gray; Eyes: PERRL.  EOM grossly intact. Nose: nares patent without rhinorrhea; Throat: tonsils nonerythematous or enlarged, uvula midline Neck: supple without LAD Lungs: clear to auscultation bilaterally without adventitious breath sounds; normal respiratory effort; no labored respirations Heart: regular rate and rhythm.  Radial pulses 2+ symmetrical bilaterally; cap refill < 2 seconds Abdomen: soft, nondistended, normal active bowel sounds; nontender to palpation; no guarding  Skin: warm and dry Psychological: alert and cooperative; normal mood and affect  ASSESSMENT & PLAN:  1. Seasonal allergies     Meds ordered this encounter  Medications  . fluticasone (FLONASE) 50 MCG/ACT nasal spray    Sig: Place 1 spray into both nostrils daily for 14 days.    Dispense:  16 g    Refill:  0  . cetirizine (ZYRTEC ALLERGY) 10 MG tablet    Sig: Take 1 tablet (10 mg total) by mouth daily.    Dispense:  30 tablet    Refill:  0  . predniSONE (DELTASONE) 10 MG tablet    Sig: Take 2 tablets (20 mg total) by mouth daily.    Dispense:  15 tablet    Refill:  0    No orders of the defined types were placed in this encounter.    Discharge instruction Rest push fluids Return or follow up with PCP in 24 hours to be reevaluated and to ensure your symptoms are improving Flonase, Zyrtec, and prednisone were prescribed Return sooner or go to the ED if you have any new or worsening symptoms such as difficulty breathing, shortness of breath, chest pain, nausea, vomiting, throat  tightness or swelling, tongue swelling or tingling, worsening lip or facial swelling, abdominal pain, changes in bowel or bladder habits, no improvement despite medications, etc...   Reviewed expectations re: course of current medical issues. Questions answered. Outlined signs and symptoms indicating need for more acute intervention. Patient verbalized understanding. After Visit Summary given.          Durward Parcel, FNP 04/02/20 1348

## 2020-04-02 NOTE — ED Triage Notes (Signed)
Provider triage  

## 2020-06-14 DIAGNOSIS — Z299 Encounter for prophylactic measures, unspecified: Secondary | ICD-10-CM | POA: Diagnosis not present

## 2020-06-14 DIAGNOSIS — F53 Postpartum depression: Secondary | ICD-10-CM | POA: Diagnosis not present

## 2020-06-14 DIAGNOSIS — Z6841 Body Mass Index (BMI) 40.0 and over, adult: Secondary | ICD-10-CM | POA: Diagnosis not present

## 2020-06-14 DIAGNOSIS — O99345 Other mental disorders complicating the puerperium: Secondary | ICD-10-CM | POA: Diagnosis not present

## 2020-06-14 DIAGNOSIS — Z789 Other specified health status: Secondary | ICD-10-CM | POA: Diagnosis not present

## 2020-06-14 DIAGNOSIS — R635 Abnormal weight gain: Secondary | ICD-10-CM | POA: Diagnosis not present

## 2020-10-21 ENCOUNTER — Other Ambulatory Visit: Payer: Self-pay

## 2020-10-21 ENCOUNTER — Emergency Department (HOSPITAL_COMMUNITY): Payer: 59

## 2020-10-21 ENCOUNTER — Encounter (HOSPITAL_COMMUNITY): Payer: Self-pay

## 2020-10-21 ENCOUNTER — Observation Stay (HOSPITAL_COMMUNITY)
Admission: EM | Admit: 2020-10-21 | Discharge: 2020-10-23 | Disposition: A | Discharge status: Home / Self Care | Payer: 59 | Attending: Internal Medicine | Admitting: Internal Medicine

## 2020-10-21 DIAGNOSIS — R109 Unspecified abdominal pain: Secondary | ICD-10-CM | POA: Insufficient documentation

## 2020-10-21 DIAGNOSIS — Z79899 Other long term (current) drug therapy: Secondary | ICD-10-CM | POA: Diagnosis not present

## 2020-10-21 DIAGNOSIS — K819 Cholecystitis, unspecified: Principal | ICD-10-CM | POA: Insufficient documentation

## 2020-10-21 DIAGNOSIS — R0789 Other chest pain: Secondary | ICD-10-CM

## 2020-10-21 DIAGNOSIS — R748 Abnormal levels of other serum enzymes: Secondary | ICD-10-CM

## 2020-10-21 DIAGNOSIS — Z20822 Contact with and (suspected) exposure to covid-19: Secondary | ICD-10-CM | POA: Insufficient documentation

## 2020-10-21 DIAGNOSIS — R7401 Elevation of levels of liver transaminase levels: Secondary | ICD-10-CM

## 2020-10-21 DIAGNOSIS — E669 Obesity, unspecified: Secondary | ICD-10-CM | POA: Diagnosis present

## 2020-10-21 DIAGNOSIS — K8050 Calculus of bile duct without cholangitis or cholecystitis without obstruction: Secondary | ICD-10-CM

## 2020-10-21 DIAGNOSIS — Z6841 Body Mass Index (BMI) 40.0 and over, adult: Secondary | ICD-10-CM | POA: Diagnosis not present

## 2020-10-21 DIAGNOSIS — K8051 Calculus of bile duct without cholangitis or cholecystitis with obstruction: Secondary | ICD-10-CM

## 2020-10-21 DIAGNOSIS — Z8659 Personal history of other mental and behavioral disorders: Secondary | ICD-10-CM

## 2020-10-21 DIAGNOSIS — R17 Unspecified jaundice: Secondary | ICD-10-CM

## 2020-10-21 DIAGNOSIS — Z23 Encounter for immunization: Secondary | ICD-10-CM | POA: Diagnosis not present

## 2020-10-21 DIAGNOSIS — K8000 Calculus of gallbladder with acute cholecystitis without obstruction: Secondary | ICD-10-CM

## 2020-10-21 DIAGNOSIS — R112 Nausea with vomiting, unspecified: Secondary | ICD-10-CM

## 2020-10-21 DIAGNOSIS — K839 Disease of biliary tract, unspecified: Secondary | ICD-10-CM

## 2020-10-21 DIAGNOSIS — R1110 Vomiting, unspecified: Secondary | ICD-10-CM | POA: Diagnosis not present

## 2020-10-21 HISTORY — DX: Anxiety disorder, unspecified: F41.9

## 2020-10-21 LAB — TROPONIN I (HIGH SENSITIVITY): Troponin I (High Sensitivity): 2 ng/L (ref <18)

## 2020-10-21 LAB — BASIC METABOLIC PANEL
Anion gap: 6 (ref 5–15)
BUN: 8 mg/dL (ref 6–20)
CO2: 24 mmol/L (ref 22–32)
Calcium: 8.7 mg/dL — ABNORMAL LOW (ref 8.9–10.3)
Chloride: 105 mmol/L (ref 98–111)
Creatinine, Ser: 0.89 mg/dL (ref 0.44–1.00)
GFR, Estimated: >60 mL/min (ref >60)
Glucose, Bld: 107 mg/dL — ABNORMAL HIGH (ref 70–99)
Potassium: 3.6 mmol/L (ref 3.5–5.1)
Sodium: 135 mmol/L (ref 135–145)

## 2020-10-21 LAB — HEPATIC FUNCTION PANEL
ALT: 339 U/L — ABNORMAL HIGH (ref 0–44)
AST: 330 U/L — ABNORMAL HIGH (ref 15–41)
Albumin: 3.8 g/dL (ref 3.5–5.0)
Alkaline Phosphatase: 101 U/L (ref 38–126)
Bilirubin, Direct: 1.8 mg/dL — ABNORMAL HIGH (ref 0.0–0.2)
Indirect Bilirubin: 1.3 mg/dL — ABNORMAL HIGH (ref 0.3–0.9)
Total Bilirubin: 3.1 mg/dL — ABNORMAL HIGH (ref 0.3–1.2)
Total Protein: 7.3 g/dL (ref 6.5–8.1)

## 2020-10-21 LAB — CBC
HCT: 36.9 % (ref 36.0–46.0)
Hemoglobin: 11.7 g/dL — ABNORMAL LOW (ref 12.0–15.0)
MCH: 26.7 pg (ref 26.0–34.0)
MCHC: 31.7 g/dL (ref 30.0–36.0)
MCV: 84.1 fL (ref 80.0–100.0)
Platelets: 227 10*3/uL (ref 150–400)
RBC: 4.39 MIL/uL (ref 3.87–5.11)
RDW: 13.2 % (ref 11.5–15.5)
WBC: 4.9 10*3/uL (ref 4.0–10.5)
nRBC: 0 % (ref 0.0–0.2)

## 2020-10-21 LAB — HCG, QUANTITATIVE, PREGNANCY: hCG, Beta Chain, Quant, S: <1 m[IU]/mL (ref <5)

## 2020-10-21 LAB — LIPASE, BLOOD: Lipase: 27 U/L (ref 11–51)

## 2020-10-21 MED ORDER — IOHEXOL 300 MG/ML  SOLN
100.0000 mL | Freq: Once | INTRAMUSCULAR | Status: AC | PRN
  Administered 2020-10-21: 100 mL via INTRAVENOUS

## 2020-10-21 MED ORDER — ONDANSETRON HCL 4 MG/2ML IJ SOLN
4.0000 mg | Freq: Once | INTRAMUSCULAR | Status: AC
  Administered 2020-10-21: 4 mg via INTRAVENOUS
  Filled 2020-10-21: qty 2

## 2020-10-21 MED ORDER — MORPHINE SULFATE (PF) 4 MG/ML IV SOLN
4.0000 mg | Freq: Once | INTRAVENOUS | Status: AC
  Administered 2020-10-21: 4 mg via INTRAVENOUS
  Filled 2020-10-21: qty 1

## 2020-10-21 NOTE — ED Triage Notes (Signed)
CP/Abd pain since this am. Called 911 for the same but didn't come to the hospital. They checked her out and said everything looked fine.  Has been off depression and anxiety meds "for a while because I was doing good." states the pain came back and woke her up today.  +n/v and cannot keep anything down.

## 2020-10-21 NOTE — ED Provider Notes (Signed)
[facility] EMERGENCY DEPARTMENT Provider Note   CSN: [id number] Arrival date & time: 10/21/20  2003     History Chief Complaint  Patient presents with  . Chest Pain    Patricia [name] is a 29 y.o. female with a history of anemia, anxiety and depression, presenting for evaluation of chest pain, nausea, vomiting and now generalized abdominal pain.  She woke around 3 am this morning with sharp, stabbing constant pain in her midsternal region that radiates to her right lateral chest.  She called 911 who did an ekg and checked her vitals signs and were normal, pt stating they felt it was a panic attack.  She denies history of similar but does endorse has been off her anxiety medicines for "awhile'.  Her pain persists today and has now developed nausea and vomiting, at least 5 episodes of vomiting today with attempts at oral intact and now has generalized abdominal pain as well. She denies diarrhea or constipation.  Denies fevers or chills. Also denies sob, denies back pain, denies increased pain with deep inspiration, but feels worse with movement.  She has had no treatment prior to arrival..    HPI     Past Medical History:  Diagnosis Date  . Anemia   . Anxiety   . Bacterial vaginosis   . Chlamydia   . Depression    PP depression after 3rd pregnancy - resolved with therarpy  . Migraine   . Preterm labor   . Preterm labor without delivery in second trimester June 2012   Positive FFN in June  . UTI (lower urinary tract infection)     Patient Active Problem List   Diagnosis Date Noted  . History of preterm delivery 11/08/2015  . 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 History of gestational diabetes 11/08/2015  . 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 Obesity 04/01/2013    Past Surgical History:  Procedure Laterality Date  . NO PAST SURGERIES    . TUBAL LIGATION N/A 09/15/2019   Procedure: POST PARTUM TUBAL LIGATION;  Surgeon: Lazaro Arms, MD;  Location: [facility];  Service:  Gynecology;  Laterality: N/A;     OB History    Gravida  5   Para  4   Term  3   Preterm  1   AB  1   Living  4     SAB  1   TAB      Ectopic      Multiple  0   Live Births  4           Family History  Problem Relation Age of Onset  . Diabetes Other        great granmother  . Diabetes Paternal Grandfather   . Hypertension Paternal Grandfather   . Cancer Paternal Grandfather        prostate  . Miscarriages / Stillbirths Maternal Grandmother     Social History   Tobacco Use  . Smoking status: Never Smoker  . Smokeless tobacco: Never Used  Vaping Use  . Vaping Use: Never used  Substance Use Topics  . Alcohol use: Not Currently    Comment: occasionally  . Drug use: No    Home Medications Prior to Admission medications   Medication Sig Start Date End Date Taking? Authorizing Provider  acyclovir (ZOVIRAX) 400 MG tablet Take 1 tablet (400 mg total) by mouth 3 (three) times daily. 08/07/19   Cresenzo-Dishmon, Scarlette Calico, CNM  cetirizine (ZYRTEC ALLERGY) 10 MG tablet Take 1  tablet (10 mg total) by mouth daily. 04/02/20   Avegno, Zachery Dakins, FNP  escitalopram (LEXAPRO) 10 MG tablet Take 1 tablet (10 mg total) by mouth daily. 11/05/19 11/04/20  Adline Potter, NP  fluticasone (FLONASE) 50 MCG/ACT nasal spray Place 1 spray into both nostrils daily for 14 days. 04/02/20 04/16/20  Avegno, Zachery Dakins, FNP  hydrOXYzine (ATARAX/VISTARIL) 10 MG tablet Take 1 tablet (10 mg total) by mouth 3 (three) times daily as needed. 11/05/19   Adline Potter, NP  predniSONE (DELTASONE) 10 MG tablet Take 2 tablets (20 mg total) by mouth daily. 04/02/20   Avegno, Zachery Dakins, FNP  Prenat w/o A-FeCbGl-DSS-FA-DHA (CITRANATAL ASSURE) 35-1 & 300 MG tablet One tablet and one capsule daily 02/12/19   Cheral Marker, CNM    Allergies    Patient has no known allergies.  [name]   [name]  Constitutional: Negative for chills and fever.  HENT: Negative.   Eyes:  Negative.   Respiratory: Negative for chest tightness and shortness of breath.   Cardiovascular: Positive for chest pain. Negative for palpitations and leg swelling.  Gastrointestinal: Positive for abdominal pain, nausea and vomiting.  Genitourinary: Negative.   Musculoskeletal: Negative for arthralgias, joint swelling and neck pain.  Skin: Negative.  Negative for rash and wound.  Neurological: Negative.   Psychiatric/Behavioral: Negative.   All other systems reviewed and are negative.   Physical Exam Updated Vital Signs BP 119/74   Pulse (!) 57   Temp 98.4 F (36.9 C) (Oral)   Resp 18   Ht 5\' 7"  (1.702 m)   Wt (!) 155.6 kg   LMP 09/27/2020   SpO2 100%   BMI 53.72 kg/m   Physical Exam Vitals and nursing note reviewed.  Constitutional:      Appearance: She is well-developed.  HENT:     Head: Normocephalic and atraumatic.  Eyes:     Conjunctiva/sclera: Conjunctivae normal.  Cardiovascular:     Rate and Rhythm: Normal rate and regular rhythm.     Heart sounds: Normal heart sounds.  Pulmonary:     Effort: Pulmonary effort is normal.     Breath sounds: Normal breath sounds. No wheezing.  Abdominal:     General: Abdomen is protuberant. Bowel sounds are normal.     Palpations: Abdomen is soft.     Tenderness: There is abdominal tenderness in the right upper quadrant and epigastric area. There is no guarding. Positive signs include Murphy's sign.  Musculoskeletal:        General: Normal range of motion.     Cervical back: Normal range of motion.  Skin:    General: Skin is warm and dry.  Neurological:     Mental Status: She is alert.     ED Results / Procedures / Treatments   Labs (all labs ordered are listed, but only abnormal results are displayed) Labs Reviewed  BASIC METABOLIC PANEL - Abnormal; Notable for the following components:      Result Value   Glucose, Bld 107 (*)    Calcium 8.7 (*)    All other components within normal limits  CBC - Abnormal; Notable  for the following components:   Hemoglobin 11.7 (*)    All other components within normal limits  HEPATIC FUNCTION PANEL - Abnormal; Notable for the following components:   AST 330 (*)    ALT 339 (*)    Total Bilirubin 3.1 (*)    Bilirubin, Direct 1.8 (*)    Indirect  Bilirubin 1.3 (*)    All other components within normal limits  HCG, QUANTITATIVE, PREGNANCY  LIPASE, BLOOD  TROPONIN I (HIGH SENSITIVITY)    EKG EKG Interpretation  Date/Time:  Thursday October 21 2020 20:11:54 EST Ventricular Rate:  67 PR Interval:  176 QRS Duration: 72 QT Interval:  408 QTC Calculation: 431 R Axis:   23 Text Interpretation: Sinus rhythm with marked sinus arrhythmia Otherwise normal ECG No STEMI Confirmed by Alvester Chou [id number]) on 10/21/2020 10:37:09 PM   Radiology DG Chest 2 View  Result Date: 10/21/2020 CLINICAL DATA:  Chest and abdominal pain EXAM: CHEST - 2 VIEW COMPARISON:  06/10/2018 FINDINGS: The heart size and mediastinal contours are within normal limits. Both lungs are clear. The visualized skeletal structures are unremarkable. IMPRESSION: No active cardiopulmonary disease. Electronically Signed   By: Jasmine Pang M.D.   On: 10/21/2020 21:12    Procedures Procedures (including critical care time)  Medications Ordered in ED Medications  morphine 4 MG/ML injection 4 mg (4 mg Intravenous Given 10/21/20 2328)  ondansetron (ZOFRAN) injection 4 mg (4 mg Intravenous Given 10/21/20 2326)  iohexol (OMNIPAQUE) 300 MG/ML solution 100 mL (100 mLs Intravenous Contrast Given 10/21/20 2346)    ED Course  I have reviewed the triage vital signs and the nursing notes.  Pertinent labs & imaging results that were available during my care of the patient were reviewed by me and considered in my medical decision making (see chart for details).    MDM Rules/Calculators/A&P                          Pt with right sided chest pain, now with n/v and upper abdominal pain.  Suspicion for biliary  colic/cholecystitis.  LFT's elevated,  CT scan pending.  Discussed with Dr. Lynelle Doctor who assumes care.  Final Clinical Impression(s) / ED Diagnoses Final diagnoses:  None    Rx / DC Orders ED Discharge Orders    None       Victoriano Lain 10/22/20 0001    Terald Sleeper, MD 10/22/20 1012

## 2020-10-21 NOTE — ED Notes (Signed)
Entered room and introduced self to patient and family at the bedside. Pt appears to be resting in bed with no obvious signs of distress noted. Bed is locked in the lowest position, side rails x2, call bell within reach. Pt educated on hourly rounding and call light use, in agreement at this time.  Lab to bedside.  Cardiac monitor in place, vital signs cycling q30 minutes. Will continue to monitor.

## 2020-10-22 ENCOUNTER — Inpatient Hospital Stay (HOSPITAL_COMMUNITY): Payer: 59 | Admitting: Anesthesiology

## 2020-10-22 ENCOUNTER — Inpatient Hospital Stay (HOSPITAL_COMMUNITY): Payer: 59

## 2020-10-22 ENCOUNTER — Encounter (HOSPITAL_COMMUNITY)
Admission: EM | Disposition: A | Discharge status: Home / Self Care | Payer: Self-pay | Source: Home / Self Care | Attending: Emergency Medicine

## 2020-10-22 DIAGNOSIS — K810 Acute cholecystitis: Secondary | ICD-10-CM | POA: Diagnosis not present

## 2020-10-22 DIAGNOSIS — R17 Unspecified jaundice: Secondary | ICD-10-CM | POA: Diagnosis not present

## 2020-10-22 DIAGNOSIS — K8051 Calculus of bile duct without cholangitis or cholecystitis with obstruction: Secondary | ICD-10-CM | POA: Insufficient documentation

## 2020-10-22 DIAGNOSIS — R748 Abnormal levels of other serum enzymes: Secondary | ICD-10-CM

## 2020-10-22 DIAGNOSIS — K839 Disease of biliary tract, unspecified: Secondary | ICD-10-CM | POA: Insufficient documentation

## 2020-10-22 DIAGNOSIS — Z79899 Other long term (current) drug therapy: Secondary | ICD-10-CM | POA: Diagnosis not present

## 2020-10-22 DIAGNOSIS — K8000 Calculus of gallbladder with acute cholecystitis without obstruction: Secondary | ICD-10-CM | POA: Diagnosis not present

## 2020-10-22 DIAGNOSIS — K8020 Calculus of gallbladder without cholecystitis without obstruction: Secondary | ICD-10-CM | POA: Diagnosis not present

## 2020-10-22 DIAGNOSIS — Z23 Encounter for immunization: Secondary | ICD-10-CM | POA: Diagnosis not present

## 2020-10-22 DIAGNOSIS — R112 Nausea with vomiting, unspecified: Secondary | ICD-10-CM

## 2020-10-22 DIAGNOSIS — R0789 Other chest pain: Secondary | ICD-10-CM

## 2020-10-22 DIAGNOSIS — R109 Unspecified abdominal pain: Secondary | ICD-10-CM | POA: Diagnosis present

## 2020-10-22 DIAGNOSIS — R1011 Right upper quadrant pain: Secondary | ICD-10-CM | POA: Diagnosis not present

## 2020-10-22 DIAGNOSIS — Z8659 Personal history of other mental and behavioral disorders: Secondary | ICD-10-CM

## 2020-10-22 DIAGNOSIS — R7401 Elevation of levels of liver transaminase levels: Secondary | ICD-10-CM | POA: Insufficient documentation

## 2020-10-22 DIAGNOSIS — K760 Fatty (change of) liver, not elsewhere classified: Secondary | ICD-10-CM | POA: Diagnosis not present

## 2020-10-22 DIAGNOSIS — K819 Cholecystitis, unspecified: Secondary | ICD-10-CM | POA: Diagnosis not present

## 2020-10-22 DIAGNOSIS — K8050 Calculus of bile duct without cholangitis or cholecystitis without obstruction: Secondary | ICD-10-CM | POA: Diagnosis not present

## 2020-10-22 DIAGNOSIS — Z6841 Body Mass Index (BMI) 40.0 and over, adult: Secondary | ICD-10-CM | POA: Diagnosis not present

## 2020-10-22 DIAGNOSIS — Z20822 Contact with and (suspected) exposure to covid-19: Secondary | ICD-10-CM | POA: Diagnosis not present

## 2020-10-22 LAB — CBC
HCT: 36.3 % (ref 36.0–46.0)
HCT: 36.4 % (ref 36.0–46.0)
Hemoglobin: 11.7 g/dL — ABNORMAL LOW (ref 12.0–15.0)
Hemoglobin: 11.6 g/dL — ABNORMAL LOW (ref 12.0–15.0)
MCH: 27.1 pg (ref 26.0–34.0)
MCH: 26.9 pg (ref 26.0–34.0)
MCHC: 32.2 g/dL (ref 30.0–36.0)
MCHC: 31.9 g/dL (ref 30.0–36.0)
MCV: 84 fL (ref 80.0–100.0)
MCV: 84.5 fL (ref 80.0–100.0)
Platelets: 222 10*3/uL (ref 150–400)
Platelets: 213 10*3/uL (ref 150–400)
RBC: 4.32 MIL/uL (ref 3.87–5.11)
RBC: 4.31 MIL/uL (ref 3.87–5.11)
RDW: 13.2 % (ref 11.5–15.5)
RDW: 13.3 % (ref 11.5–15.5)
WBC: 4.4 10*3/uL (ref 4.0–10.5)
WBC: 3.8 10*3/uL — ABNORMAL LOW (ref 4.0–10.5)
nRBC: 0 % (ref 0.0–0.2)
nRBC: 0 % (ref 0.0–0.2)

## 2020-10-22 LAB — TROPONIN I (HIGH SENSITIVITY)
Troponin I (High Sensitivity): 3 ng/L (ref <18)
Troponin I (High Sensitivity): 2 ng/L (ref <18)

## 2020-10-22 LAB — RESP PANEL BY RT-PCR (FLU A&B, COVID) ARPGX2
Influenza A by PCR: NEGATIVE
Influenza B by PCR: NEGATIVE
SARS Coronavirus 2 by RT PCR: NEGATIVE

## 2020-10-22 LAB — COMPREHENSIVE METABOLIC PANEL
ALT: 367 U/L — ABNORMAL HIGH (ref 0–44)
AST: 305 U/L — ABNORMAL HIGH (ref 15–41)
Albumin: 3.6 g/dL (ref 3.5–5.0)
Alkaline Phosphatase: 110 U/L (ref 38–126)
Anion gap: 7 (ref 5–15)
BUN: 7 mg/dL (ref 6–20)
CO2: 24 mmol/L (ref 22–32)
Calcium: 8.6 mg/dL — ABNORMAL LOW (ref 8.9–10.3)
Chloride: 105 mmol/L (ref 98–111)
Creatinine, Ser: 0.82 mg/dL (ref 0.44–1.00)
GFR, Estimated: >60 mL/min (ref >60)
Glucose, Bld: 99 mg/dL (ref 70–99)
Potassium: 3.6 mmol/L (ref 3.5–5.1)
Sodium: 136 mmol/L (ref 135–145)
Total Bilirubin: 3.9 mg/dL — ABNORMAL HIGH (ref 0.3–1.2)
Total Protein: 7.1 g/dL (ref 6.5–8.1)

## 2020-10-22 LAB — PHOSPHORUS: Phosphorus: 3.7 mg/dL (ref 2.5–4.6)

## 2020-10-22 LAB — PROTIME-INR
INR: 1.1 (ref 0.8–1.2)
Prothrombin Time: 13.3 seconds (ref 11.4–15.2)

## 2020-10-22 LAB — MAGNESIUM: Magnesium: 2.1 mg/dL (ref 1.7–2.4)

## 2020-10-22 LAB — APTT: aPTT: 28 seconds (ref 24–36)

## 2020-10-22 LAB — HIV ANTIBODY (ROUTINE TESTING W REFLEX): HIV Screen 4th Generation wRfx: NONREACTIVE

## 2020-10-22 SURGERY — LAPAROSCOPIC CHOLECYSTECTOMY
Anesthesia: General | Site: Abdomen

## 2020-10-22 MED ORDER — DEXAMETHASONE SODIUM PHOSPHATE 10 MG/ML IJ SOLN
INTRAMUSCULAR | Status: DC | PRN
  Administered 2020-10-22: 10 mg via INTRAVENOUS

## 2020-10-22 MED ORDER — ORAL CARE MOUTH RINSE: 15.0000 mL | Freq: Once | OROMUCOSAL | Status: DC

## 2020-10-22 MED ORDER — PANTOPRAZOLE SODIUM 40 MG IV SOLR
40.0000 mg | INTRAVENOUS | Status: DC
  Administered 2020-10-22 – 2020-10-23 (×2): 40 mg via INTRAVENOUS
  Filled 2020-10-22 (×2): qty 40

## 2020-10-22 MED ORDER — ONDANSETRON HCL 4 MG/2ML IJ SOLN: 4.0000 mg | Freq: Four times a day (QID) | INTRAMUSCULAR | Status: DC | PRN

## 2020-10-22 MED ORDER — INFLUENZA VAC SPLIT QUAD 0.5 ML IM SUSY
0.5000 mL | PREFILLED_SYRINGE | INTRAMUSCULAR | Status: AC
  Administered 2020-10-23: 0.5 mL via INTRAMUSCULAR
  Filled 2020-10-22: qty 0.5

## 2020-10-22 MED ORDER — CHLORHEXIDINE GLUCONATE CLOTH 2 % EX PADS
6.0000 | MEDICATED_PAD | Freq: Once | CUTANEOUS | Status: DC
  Administered 2020-10-22: 6 via TOPICAL

## 2020-10-22 MED ORDER — MEPERIDINE HCL 50 MG/ML IJ SOLN: 6.2500 mg | INTRAMUSCULAR | Status: DC | PRN

## 2020-10-22 MED ORDER — MIDAZOLAM HCL 5 MG/5ML IJ SOLN
INTRAMUSCULAR | Status: DC | PRN
  Administered 2020-10-22 (×2): 2 mg via INTRAVENOUS

## 2020-10-22 MED ORDER — PHENYLEPHRINE 40 MCG/ML (10ML) SYRINGE FOR IV PUSH (FOR BLOOD PRESSURE SUPPORT)
PREFILLED_SYRINGE | INTRAVENOUS | Status: DC | PRN
  Administered 2020-10-22 (×3): 200 ug via INTRAVENOUS

## 2020-10-22 MED ORDER — FENTANYL CITRATE (PF) 250 MCG/5ML IJ SOLN
INTRAMUSCULAR | Status: DC | PRN
Start: 2020-10-22 — End: 2020-10-22
  Administered 2020-10-22 (×5): 50 ug via INTRAVENOUS

## 2020-10-22 MED ORDER — SODIUM CHLORIDE 0.9 % IV SOLN
INTRAVENOUS | Status: AC
  Filled 2020-10-22: qty 2

## 2020-10-22 MED ORDER — METOCLOPRAMIDE HCL 5 MG/ML IJ SOLN
INTRAMUSCULAR | Status: DC | PRN
  Administered 2020-10-22: 10 mg via INTRAVENOUS

## 2020-10-22 MED ORDER — HEPARIN SODIUM (PORCINE) 5000 UNIT/ML IJ SOLN
5000.0000 [IU] | Freq: Three times a day (TID) | INTRAMUSCULAR | Status: DC
  Administered 2020-10-22 – 2020-10-23 (×4): 5000 [IU] via SUBCUTANEOUS
  Filled 2020-10-22 (×4): qty 1

## 2020-10-22 MED ORDER — DEXAMETHASONE SODIUM PHOSPHATE 10 MG/ML IJ SOLN
INTRAMUSCULAR | Status: AC
  Filled 2020-10-22: qty 1

## 2020-10-22 MED ORDER — ONDANSETRON HCL 4 MG/2ML IJ SOLN
INTRAMUSCULAR | Status: AC
  Filled 2020-10-22: qty 2

## 2020-10-22 MED ORDER — PIPERACILLIN-TAZOBACTAM 3.375 G IVPB 30 MIN
3.3750 g | Freq: Once | INTRAVENOUS | Status: DC
  Administered 2020-10-22: 3.375 g via INTRAVENOUS
  Filled 2020-10-22 (×2): qty 50

## 2020-10-22 MED ORDER — MIDAZOLAM HCL 2 MG/2ML IJ SOLN
INTRAMUSCULAR | Status: AC
  Filled 2020-10-22: qty 2

## 2020-10-22 MED ORDER — GLYCOPYRROLATE PF 0.2 MG/ML IJ SOSY
PREFILLED_SYRINGE | INTRAMUSCULAR | Status: DC | PRN
  Administered 2020-10-22: .2 mg via INTRAVENOUS

## 2020-10-22 MED ORDER — SUCCINYLCHOLINE CHLORIDE 200 MG/10ML IV SOSY
PREFILLED_SYRINGE | INTRAVENOUS | Status: AC
  Filled 2020-10-22: qty 10

## 2020-10-22 MED ORDER — SUGAMMADEX SODIUM 200 MG/2ML IV SOLN
INTRAVENOUS | Status: DC | PRN
  Administered 2020-10-22: 312 mg via INTRAVENOUS

## 2020-10-22 MED ORDER — PROPOFOL 10 MG/ML IV BOLUS
INTRAVENOUS | Status: AC
  Filled 2020-10-22: qty 40

## 2020-10-22 MED ORDER — LACTATED RINGERS IV SOLN: Freq: Once | INTRAVENOUS | Status: AC

## 2020-10-22 MED ORDER — ONDANSETRON HCL 4 MG/2ML IJ SOLN
INTRAMUSCULAR | Status: DC | PRN
  Administered 2020-10-22: 4 mg via INTRAVENOUS

## 2020-10-22 MED ORDER — SODIUM CHLORIDE 0.9 % IV SOLN
Freq: Once | INTRAVENOUS | Status: AC
  Administered 2020-10-22: 1000 mL via INTRAVENOUS

## 2020-10-22 MED ORDER — KETOROLAC TROMETHAMINE 30 MG/ML IJ SOLN
INTRAMUSCULAR | Status: DC | PRN
  Administered 2020-10-22: 30 mg via INTRAVENOUS

## 2020-10-22 MED ORDER — HYDROMORPHONE HCL 1 MG/ML IJ SOLN: 0.2500 mg | INTRAMUSCULAR | Status: DC | PRN

## 2020-10-22 MED ORDER — MORPHINE SULFATE (PF) 2 MG/ML IV SOLN
2.0000 mg | INTRAVENOUS | Status: DC | PRN
  Administered 2020-10-22: 2 mg via INTRAVENOUS
  Filled 2020-10-22: qty 1

## 2020-10-22 MED ORDER — SODIUM CHLORIDE 0.9 % IR SOLN
Status: DC | PRN
  Administered 2020-10-22: 1000 mL

## 2020-10-22 MED ORDER — SUCCINYLCHOLINE CHLORIDE 20 MG/ML IJ SOLN
INTRAMUSCULAR | Status: DC | PRN
  Administered 2020-10-22: 200 mg via INTRAVENOUS

## 2020-10-22 MED ORDER — HYDROMORPHONE HCL 1 MG/ML IJ SOLN
0.5000 mg | INTRAMUSCULAR | Status: DC | PRN
  Administered 2020-10-22 – 2020-10-23 (×4): 0.5 mg via INTRAVENOUS
  Filled 2020-10-22 (×4): qty 0.5

## 2020-10-22 MED ORDER — POTASSIUM CHLORIDE IN NACL 20-0.9 MEQ/L-% IV SOLN
INTRAVENOUS | Status: AC
  Filled 2020-10-22: qty 1000

## 2020-10-22 MED ORDER — BUPIVACAINE HCL (PF) 0.5 % IJ SOLN
INTRAMUSCULAR | Status: AC
  Filled 2020-10-22: qty 30

## 2020-10-22 MED ORDER — HEMOSTATIC AGENTS (NO CHARGE) OPTIME
TOPICAL | Status: DC | PRN
  Administered 2020-10-22: 1 via TOPICAL

## 2020-10-22 MED ORDER — CHLORHEXIDINE GLUCONATE CLOTH 2 % EX PADS: 6.0000 | MEDICATED_PAD | Freq: Once | CUTANEOUS | Status: DC

## 2020-10-22 MED ORDER — ROCURONIUM BROMIDE 10 MG/ML (PF) SYRINGE
PREFILLED_SYRINGE | INTRAVENOUS | Status: DC | PRN
  Administered 2020-10-22: 50 mg via INTRAVENOUS

## 2020-10-22 MED ORDER — OXYCODONE HCL 5 MG PO TABS
5.0000 mg | ORAL_TABLET | ORAL | Status: DC | PRN
  Administered 2020-10-22 – 2020-10-23 (×4): 5 mg via ORAL
  Filled 2020-10-22 (×4): qty 1

## 2020-10-22 MED ORDER — CHLORHEXIDINE GLUCONATE 0.12 % MT SOLN: 15.0000 mL | Freq: Once | OROMUCOSAL | Status: DC

## 2020-10-22 MED ORDER — METOCLOPRAMIDE HCL 5 MG/ML IJ SOLN
INTRAMUSCULAR | Status: AC
  Filled 2020-10-22: qty 2

## 2020-10-22 MED ORDER — PROPOFOL 10 MG/ML IV BOLUS
INTRAVENOUS | Status: DC | PRN
  Administered 2020-10-22: 250 mg via INTRAVENOUS

## 2020-10-22 MED ORDER — FENTANYL CITRATE (PF) 250 MCG/5ML IJ SOLN
INTRAMUSCULAR | Status: AC
  Filled 2020-10-22: qty 5

## 2020-10-22 MED ORDER — LIDOCAINE HCL (PF) 2 % IJ SOLN
INTRAMUSCULAR | Status: AC
  Filled 2020-10-22: qty 5

## 2020-10-22 MED ORDER — BUPIVACAINE HCL (PF) 0.5 % IJ SOLN
INTRAMUSCULAR | Status: DC | PRN
  Administered 2020-10-22: 10 mL

## 2020-10-22 MED ORDER — SODIUM CHLORIDE 0.9 % IV SOLN
2.0000 g | INTRAVENOUS | Status: AC
  Administered 2020-10-22: 2 g via INTRAVENOUS
  Filled 2020-10-22: qty 2

## 2020-10-22 MED ORDER — ROCURONIUM BROMIDE 10 MG/ML (PF) SYRINGE
PREFILLED_SYRINGE | INTRAVENOUS | Status: AC
  Filled 2020-10-22: qty 10

## 2020-10-22 MED ORDER — PROMETHAZINE HCL 25 MG/ML IJ SOLN: 6.2500 mg | INTRAMUSCULAR | Status: DC | PRN

## 2020-10-22 MED ORDER — LIDOCAINE HCL (CARDIAC) PF 100 MG/5ML IV SOSY
PREFILLED_SYRINGE | INTRAVENOUS | Status: DC | PRN
  Administered 2020-10-22: 100 mg via INTRATRACHEAL

## 2020-10-22 MED ORDER — EPHEDRINE SULFATE-NACL 50-0.9 MG/10ML-% IV SOSY
PREFILLED_SYRINGE | INTRAVENOUS | Status: DC | PRN
  Administered 2020-10-22: 20 mg via INTRAVENOUS

## 2020-10-22 MED ORDER — PIPERACILLIN-TAZOBACTAM 3.375 G IVPB
3.3750 g | Freq: Three times a day (TID) | INTRAVENOUS | Status: DC
  Administered 2020-10-22: 3.375 g via INTRAVENOUS

## 2020-10-22 MED ORDER — GADOBUTROL 1 MMOL/ML IV SOLN
10.0000 mL | Freq: Once | INTRAVENOUS | Status: AC | PRN
  Administered 2020-10-22: 10 mL via INTRAVENOUS

## 2020-10-22 SURGICAL SUPPLY — 46 items
ADH SKN CLS APL DERMABOND .7 (GAUZE/BANDAGES/DRESSINGS) ×1
APL PRP STRL LF DISP 70% ISPRP (MISCELLANEOUS) ×1
APPLIER CLIP ROT 10 11.4 M/L (STAPLE) ×3
APR CLP MED LRG 11.4X10 (STAPLE) ×1
BAG RETRIEVAL 10 (BASKET) ×1
BAG RETRIEVAL 10MM (BASKET) ×1
BLADE SURG 15 STRL LF DISP TIS (BLADE) ×1 IMPLANT
BLADE SURG 15 STRL SS (BLADE) ×3
CHLORAPREP W/TINT 26 (MISCELLANEOUS) ×3 IMPLANT
CLIP APPLIE ROT 10 11.4 M/L (STAPLE) ×1 IMPLANT
CLOTH BEACON ORANGE TIMEOUT ST (SAFETY) ×3 IMPLANT
COVER LIGHT HANDLE STERIS (MISCELLANEOUS) ×6 IMPLANT
COVER WAND RF STERILE (DRAPES) ×3 IMPLANT
DECANTER SPIKE VIAL GLASS SM (MISCELLANEOUS) ×3 IMPLANT
DERMABOND ADVANCED (GAUZE/BANDAGES/DRESSINGS) ×2
DERMABOND ADVANCED .7 DNX12 (GAUZE/BANDAGES/DRESSINGS) ×1 IMPLANT
ELECT REM PT RETURN 9FT ADLT (ELECTROSURGICAL) ×3
ELECTRODE REM PT RTRN 9FT ADLT (ELECTROSURGICAL) ×1 IMPLANT
GLOVE BIO SURGEON STRL SZ 6.5 (GLOVE) ×2 IMPLANT
GLOVE BIO SURGEONS STRL SZ 6.5 (GLOVE) ×1
GLOVE BIOGEL PI IND STRL 6.5 (GLOVE) ×1 IMPLANT
GLOVE BIOGEL PI IND STRL 7.0 (GLOVE) ×3 IMPLANT
GLOVE BIOGEL PI INDICATOR 6.5 (GLOVE) ×2
GLOVE BIOGEL PI INDICATOR 7.0 (GLOVE) ×6
GOWN STRL REUS W/TWL LRG LVL3 (GOWN DISPOSABLE) ×9 IMPLANT
HEMOSTAT SNOW SURGICEL 2X4 (HEMOSTASIS) ×3 IMPLANT
INST SET LAPROSCOPIC AP (KITS) ×3 IMPLANT
KIT TURNOVER KIT A (KITS) ×3 IMPLANT
MANIFOLD NEPTUNE II (INSTRUMENTS) ×3 IMPLANT
NEEDLE INSUFFLATION 14GA 120MM (NEEDLE) ×3 IMPLANT
NS IRRIG 1000ML POUR BTL (IV SOLUTION) ×3 IMPLANT
PACK LAP CHOLE LZT030E (CUSTOM PROCEDURE TRAY) ×3 IMPLANT
PAD ARMBOARD 7.5X6 YLW CONV (MISCELLANEOUS) ×3 IMPLANT
SET BASIN LINEN APH (SET/KITS/TRAYS/PACK) ×3 IMPLANT
SET TUBE SMOKE EVAC HIGH FLOW (TUBING) ×3 IMPLANT
SLEEVE ENDOPATH XCEL 5M (ENDOMECHANICALS) ×3 IMPLANT
SUT MNCRL AB 4-0 PS2 18 (SUTURE) ×6 IMPLANT
SUT VICRYL 0 UR6 27IN ABS (SUTURE) ×3 IMPLANT
SYS BAG RETRIEVAL 10MM (BASKET) ×1
SYSTEM BAG RETRIEVAL 10MM (BASKET) ×1 IMPLANT
TROCAR ENDO BLADELESS 11MM (ENDOMECHANICALS) ×3 IMPLANT
TROCAR XCEL NON-BLD 5MMX100MML (ENDOMECHANICALS) ×3 IMPLANT
TROCAR XCEL UNIV SLVE 11M 100M (ENDOMECHANICALS) ×3 IMPLANT
TUBE CONNECTING 12'X1/4 (SUCTIONS) ×1
TUBE CONNECTING 12X1/4 (SUCTIONS) ×2 IMPLANT
WARMER LAPAROSCOPE (MISCELLANEOUS) ×3 IMPLANT

## 2020-10-22 NOTE — Anesthesia Procedure Notes (Signed)
Procedure Name: Intubation Date/Time: 10/22/2020 2:05 PM Performed by: Denese Killings, MD Pre-anesthesia Checklist: Patient identified, Emergency Drugs available, Suction available and Patient being monitored Patient Re-evaluated:Patient Re-evaluated prior to induction Oxygen Delivery Method: Circle system utilized Preoxygenation: Pre-oxygenation with 100% oxygen Induction Type: IV induction and Rapid sequence Laryngoscope Size: Mac and 4 Grade View: Grade II Tube type: Oral Tube size: 7.5 mm Number of attempts: 1 Airway Equipment and Method: Stylet Placement Confirmation: ETT inserted through vocal cords under direct vision,  positive ETCO2 and breath sounds checked- equal and bilateral Tube secured with: Tape Dental Injury: Teeth and Oropharynx as per pre-operative assessment

## 2020-10-22 NOTE — H&P (Signed)
History and Physical  Patricia [name] SWN:[id number] DOB: 23-Apr-1991 DOA: 10/21/2020  Referring physician: Victoriano Lain PCP: [facility]  Patient coming from: Home  Chief Complaint: Chest pain and abdominal pain  HPI: Patricia [name] is a 29 y.o. female obese with medical history significant for anxiety and depression who presents to the emergency department due to chest pain which started around 3 AM on 11/25 with sharp, stabbing and constant pain in midsternal region with radiation to right side of the chest, chest pain was reproducible.  She activated EMS, on arrival of EMS, EKG and vital signs done were normal.  She was told that symptoms was possibly related to panic attack especially since she has not been taking her anxiety medication in a while.  Patient was not taken to the ED at that time and was asked to reactivate EMS if the pain recurs.  Shortly after this, patient started to note abdominal pain in the right upper quadrant and this was associated with nausea and several episodes of nonbloody, nonbilious vomiting with inability to tolerate any oral intake including water.  Patient denies ever having this type of pain before, she denies fever, chills, diarrhea, constipation or having any sick contact.  ED Course: In the emergency department, she was hemodynamically stable, though BP was soft.  Work-up in the ED showed normocytic anemia, normal BMP, elevated liver enzymes and elevated total bilirubin.  Lipase was 27. CT abdomen and pelvis showed mild to moderate intra and extrahepatic bile duct dilatation, no obstructing stone or mass is identified, probable cholelithiasis was suspected.  No evidence of bowel obstruction or inflammation was noted. Chest x-ray showed no active cardiopulmonary disease. She was treated with IV morphine 4 Mg x1, IV Zofran was given. ED physician consulted with Dr. Jena Gauss (gastroenterology) who suggested that patient could be admitted  here at [facility] and to obtain MRCP later today. Hospitalist was asked to admit patient for further evaluation and management.  [name]: Constitutional: Negative for chills and fever.  HENT: Negative for ear pain and sore throat.   Eyes: Negative for pain and visual disturbance.  Respiratory: Negative for cough, chest tightness and shortness of breath.   Cardiovascular: Positive for chest pain and negative for palpitations.  Gastrointestinal: Positive for for abdominal pain, nausea and vomiting.  Endocrine: Negative for polyphagia and polyuria.  Genitourinary: Negative for decreased urine volume, dysuria, enuresis Musculoskeletal: Negative for arthralgias and back pain.  Skin: Negative for color change and rash.  Allergic/Immunologic: Negative for immunocompromised state.  Neurological: Negative for tremors, syncope, speech difficulty, weakness, light-headedness and headaches.  Hematological: Does not bruise/bleed easily.  All other systems reviewed and are negative    Past Medical History:  Diagnosis Date  . Anemia   . Anxiety   . Bacterial vaginosis   . Chlamydia   . Depression    PP depression after 3rd pregnancy - resolved with therarpy  . Migraine   . Preterm labor   . Preterm labor without delivery in second trimester June 2012   Positive FFN in June  . UTI (lower urinary tract infection)    Past Surgical History:  Procedure Laterality Date  . NO PAST SURGERIES    . TUBAL LIGATION N/A 09/15/2019   Procedure: POST PARTUM TUBAL LIGATION;  Surgeon: Lazaro Arms, MD;  Location: [facility] LD ORS;  Service: Gynecology;  Laterality: N/A;    Social History:  reports that she has never smoked. She has never used smokeless tobacco.  She reports previous alcohol use. She reports that she does not use drugs.   No Known Allergies  Family History  Problem Relation Age of Onset  . Diabetes Other        great granmother  . Diabetes Paternal Grandfather   . Hypertension  Paternal Grandfather   . Cancer Paternal Grandfather        prostate  . Miscarriages / Stillbirths Maternal Grandmother      Prior to Admission medications   Medication Sig Start Date End Date Taking? Authorizing Provider  acyclovir (ZOVIRAX) 400 MG tablet Take 1 tablet (400 mg total) by mouth 3 (three) times daily. 08/07/19   Cresenzo-Dishmon, Scarlette Calico, CNM  cetirizine (ZYRTEC ALLERGY) 10 MG tablet Take 1 tablet (10 mg total) by mouth daily. 04/02/20   Avegno, Zachery Dakins, FNP  escitalopram (LEXAPRO) 10 MG tablet Take 1 tablet (10 mg total) by mouth daily. 11/05/19 11/04/20  Adline Potter, NP  fluticasone (FLONASE) 50 MCG/ACT nasal spray Place 1 spray into both nostrils daily for 14 days. 04/02/20 04/16/20  Avegno, Zachery Dakins, FNP  hydrOXYzine (ATARAX/VISTARIL) 10 MG tablet Take 1 tablet (10 mg total) by mouth 3 (three) times daily as needed. 11/05/19   Adline Potter, NP  predniSONE (DELTASONE) 10 MG tablet Take 2 tablets (20 mg total) by mouth daily. 04/02/20   Avegno, Zachery Dakins, FNP  Prenat w/o A-[id number]-[id number]-DHA (CITRANATAL ASSURE) 35-1 & 300 MG tablet One tablet and one capsule daily 02/12/19   Cheral Marker, CNM    Physical Exam: BP 102/61   Pulse 71   Temp 98.4 F (36.9 C) (Oral)   Resp 15   Ht 5\' 7"  (1.702 m)   Wt (!) 155.6 kg   LMP 09/27/2020   SpO2 95%   BMI 53.72 kg/m   . General: 28 y.o. year-old female well developed well nourished in no acute distress.  Alert and oriented x3. 26 HEENT: NCAT, EOMI . Neck: Supple, trachea medial . Cardiovascular: Regular rate and rhythm with no rubs or gallops.  No thyromegaly or JVD noted.  No lower extremity edema. 2/4 pulses in all 4 extremities. Marland Kitchen Respiratory: Clear to auscultation with no wheezes or rales. Good inspiratory effort. . Abdomen: Soft tender to palpation of RUQ and epigastric area.  Positive Murphy sign.  Normal bowel sounds x4 quadrants. . Muskuloskeletal: No cyanosis, clubbing or edema noted bilaterally . Neuro:  CN II-XII intact, strength, sensation, reflexes . Skin: No ulcerative lesions noted or rashes . Psychiatry: Judgement and insight appear normal. Mood is appropriate for condition and setting          Labs on Admission:  Basic Metabolic Panel: Recent Labs  Lab 10/21/20 2150 10/22/20 0353  NA 135 136  K 3.6 3.6  CL 105 105  CO2 24 24  GLUCOSE 107* 99  BUN 8 7  CREATININE 0.89 0.82  CALCIUM 8.7* 8.6*  MG  --  2.1  PHOS  --  3.7   Liver Function Tests: Recent Labs  Lab 10/21/20 2150 10/22/20 0353  AST 330* 305*  ALT 339* 367*  ALKPHOS 101 110  BILITOT 3.1* 3.9*  PROT 7.3 7.1  ALBUMIN 3.8 3.6   Recent Labs  Lab 10/21/20 2150  LIPASE 27   No results for input(s): AMMONIA in the last 168 hours. CBC: Recent Labs  Lab 10/21/20 2150 10/22/20 0353  WBC 4.9 4.4  HGB 11.7* 11.7*  HCT 36.9 36.3  MCV 84.1 84.0  PLT 227 222   Cardiac  Enzymes: No results for input(s): CKTOTAL, CKMB, CKMBINDEX, TROPONINI in the last 168 hours.  BNP (last 3 results) No results for input(s): BNP in the last 8760 hours.  ProBNP (last 3 results) No results for input(s): PROBNP in the last 8760 hours.  CBG: No results for input(s): GLUCAP in the last 168 hours.  Radiological Exams on Admission: DG Chest 2 View  Result Date: 10/21/2020 CLINICAL DATA:  Chest and abdominal pain EXAM: CHEST - 2 VIEW COMPARISON:  06/10/2018 FINDINGS: The heart size and mediastinal contours are within normal limits. Both lungs are clear. The visualized skeletal structures are unremarkable. IMPRESSION: No active cardiopulmonary disease. Electronically Signed   By: Jasmine PangKim  Fujinaga M.D.   On: 10/21/2020 21:12   CT ABDOMEN PELVIS W CONTRAST  Result Date: 10/22/2020 CLINICAL DATA:  Abdominal pain. Suspected biliary obstruction. Right-sided abdominal pain with nausea and vomiting. EXAM: CT ABDOMEN AND PELVIS WITH CONTRAST TECHNIQUE: Multidetector CT imaging of the abdomen and pelvis was performed using the  standard protocol following bolus administration of intravenous contrast. CONTRAST:  100mL OMNIPAQUE IOHEXOL 300 MG/ML  SOLN COMPARISON:  CT chest 06/10/2018.  CT abdomen and pelvis 06/03/2012 FINDINGS: Lower chest: The lung bases are clear. Hepatobiliary: Mild to moderate intra and extrahepatic bile duct dilatation. No obstructing stone or mass is identified. Suggest MRCP to exclude an occult lesion. Suggestion of vague attenuation change in the gallbladder neck probably representing gallstones. No gallbladder wall thickening or infiltration. No focal liver lesions. Pancreas: Unremarkable. No pancreatic ductal dilatation or surrounding inflammatory changes. Spleen: Normal in size without focal abnormality. Adrenals/Urinary Tract: Adrenal glands are unremarkable. Kidneys are normal, without renal calculi, focal lesion, or hydronephrosis. Bladder is unremarkable. Stomach/Bowel: Stomach is within normal limits. Appendix appears normal. No evidence of bowel wall thickening, distention, or inflammatory changes. Vascular/Lymphatic: No significant vascular findings are present. No enlarged abdominal or pelvic lymph nodes. Reproductive: Uterus and bilateral adnexa are unremarkable. Other: No free air or free fluid in the abdomen. Several periumbilical hernias containing fat. Musculoskeletal: No acute or significant osseous findings. IMPRESSION: 1. Mild to moderate intra and extrahepatic bile duct dilatation. No obstructing stone or mass is identified. Probable cholelithiasis. Differential diagnosis may include non radiopaque common duct stones, occult mass, or inflammatory process/stricture. Suggest MRCP to exclude an occult lesion. 2. No evidence of bowel obstruction or inflammation. Normal appendix. 3. Several periumbilical hernias containing fat. Electronically Signed   By: Burman NievesWilliam  Stevens M.D.   On: 10/22/2020 00:11    EKG: I independently viewed the EKG done and my findings are as followed: Sinus rhythm with sinus  arrhythmia at a rate of 67 bpm  Assessment/Plan Present on Admission: . Abdominal pain . Obesity  Principal Problem:   Abdominal pain Active Problems:   Obesity   Nausea & vomiting   Atypical chest pain   History of anxiety   Biliary colic   Elevated liver enzymes   High total bilirubin   Nausea, vomiting and abdominal pain secondary to biliary colic, rule out acute cholelithiasis CT abdomen and pelvis showed mild to moderate intra and extrahepatic bile duct dilatation, no obstructing stone or mass is identified, probable cholelithiasis was suspected Continue IV NS at 75 mLs/Hr Continue IV morphine 2 mg q.4h p.r.n. for moderate to severe pain Continue IV Protonix 40 mg daily Continue IV Zofran p.r.n. Patient will be placed n.p.o. at this time MRCP will be done in the morning Gastroenterology will be consulted (of note, ED physician already consulted with Dr. Jena Gaussourk who recommended MRCP  as indicated above)  Elevated liver enzymes and bilirubin secondary to above AST 330, ALT 339, ALP 101, total bilirubin 3.1 Continue management as per above  Atypical chest pain Patient chest pain was reproducible and it may be pleural in nature Troponin x1 was negative.  Obesity (BMI 53.70) Patient will be counseled on diet and lifestyle modification  History of anxiety Patient states that she was no longer taking any medication for anxiety Continue to monitor patient for anxiety and treat accordingly   DVT prophylaxis: Heparin subcu  Code Status: Full code  Family Communication: None at bedside  Disposition Plan:  Patient is from:                        home Anticipated DC to:                   SNF or family members home Anticipated DC date:               2-3 days Anticipated DC barriers:          Patient is unable to be discharged at this time due to abdominal pain nausea and vomiting with poor oral intake tolerance and pending MRCP as well as gastroenterology consult with  recommendation.  Consults called: Gastroenterology  Admission status: Inpatient  Frankey Shown MD [facility]  10/22/2020, 5:53 AM

## 2020-10-22 NOTE — Anesthesia Postprocedure Evaluation (Signed)
Anesthesia Post Note  Patient: Patricia [name]  Procedure(s) Performed: LAPAROSCOPIC CHOLECYSTECTOMY (N/A Abdomen)  Patient location during evaluation: PACU Anesthesia Type: General Level of consciousness: awake, awake and alert, oriented and sedated Pain management: pain level controlled Vital Signs Assessment: post-procedure vital signs reviewed and stable Respiratory status: spontaneous breathing, respiratory function stable and patient connected to nasal cannula oxygen Cardiovascular status: blood pressure returned to baseline Postop Assessment: no apparent nausea or vomiting Anesthetic complications: no   No complications documented.   Last Vitals:  Vitals:   10/22/20 1530 10/22/20 1545  BP: (!) 102/49 112/62  Pulse: 97 91  Resp: 18 14  Temp:    SpO2: 100% 97%    Last Pain:  Vitals:   10/22/20 1545  TempSrc:   PainSc: Asleep                 Jashanti Clinkscale C Lakeishia Truluck

## 2020-10-22 NOTE — Discharge Instructions (Signed)
Discharge Laparoscopic Surgery Instructions:  Common Complaints: Right shoulder pain is common after laparoscopic surgery. This is secondary to the gas used in the surgery being trapped under the diaphragm.  Walk to help your body absorb the gas. This will improve in a few days. Pain at the port sites are common, especially the larger port sites. This will improve with time.  Some nausea is common and poor appetite. The main goal is to stay hydrated the first few days after surgery.   Diet/ Activity: Diet as tolerated. You may not have an appetite, but it is important to stay hydrated. Drink 64 ounces of water a day. Your appetite will return with time.  Shower per your regular routine daily.  Do not take hot showers. Take warm showers that are less than 10 minutes. Rest and listen to your body, but do not remain in bed all day.  Walk everyday for at least 15-20 minutes. Deep cough and move around every 1-2 hours in the first few days after surgery.  Do not lift > 10 lbs, perform excessive bending, pushing, pulling, squatting for 1-2 weeks after surgery.  Do not pick at the dermabond glue on your incision sites.  This glue film will remain in place for 1-2 weeks and will start to peel off.  Do not place lotions or balms on your incision unless instructed to specifically by Dr. Gracia Saggese.   Pain Expectations and Narcotics: -After surgery you will have pain associated with your incisions and this is normal. The pain is muscular and nerve pain, and will get better with time. -You are encouraged and expected to take non narcotic medications like tylenol and ibuprofen (when able) to treat pain as multiple modalities can aid with pain treatment. -Narcotics are only used when pain is severe or there is breakthrough pain. -You are not expected to have a pain score of 0 after surgery, as we cannot prevent pain. A pain score of 3-4 that allows you to be functional, move, walk, and tolerate some activity is  the goal. The pain will continue to improve over the days after surgery and is dependent on your surgery. -Due to [place] law, we are only able to give a certain amount of pain medication to treat post operative pain, and we only give additional narcotics on a patient by patient basis.  -For most laparoscopic surgery, studies have shown that the majority of patients only need 10-15 narcotic pills, and for open surgeries most patients only need 15-20.   -Having appropriate expectations of pain and knowledge of pain management with non narcotics is important as we do not want anyone to become addicted to narcotic pain medication.  -Using ice packs in the first 48 hours and heating pads after 48 hours, wearing an abdominal binder (when recommended), and using over the counter medications are all ways to help with pain management.   -Simple acts like meditation and mindfulness practices after surgery can also help with pain control and research has proven the benefit of these practices.  Medication: Take tylenol and ibuprofen as needed for pain control, alternating every 4-6 hours.  Example:  Tylenol 1000mg @ 6am, 12noon, 6pm, 12midnight (Do not exceed 4000mg of tylenol a day). Ibuprofen 800mg @ 9am, 3pm, 9pm, 3am (Do not exceed 3600mg of ibuprofen a day).  Take Roxicodone for breakthrough pain every 4 hours.  Take Colace for constipation related to narcotic pain medication. If you do not have a bowel movement in 2 days, take Miralax   over the counter.  Drink plenty of water to also prevent constipation.   Contact Information: If you have questions or concerns, please call our office, [phone], Monday- Thursday 8AM-5PM and Friday 8AM-12Noon.  If it is after hours or on the weekend, please call [facility] Main Number, [phone] or [facility] Main Number, [phone],  and ask to speak to the surgeon on call for Dr. Henreitta Leber at [facility].    Laparoscopic Cholecystectomy, Care After This sheet gives  you information about how to care for yourself after your procedure. Your doctor may also give you more specific instructions. If you have problems or questions, contact your doctor. Follow these instructions at home: Care for cuts from surgery (incisions)   Follow instructions from your doctor about how to take care of your cuts from surgery. Make sure you: ? Wash your hands with soap and water before you change your bandage (dressing). If you cannot use soap and water, use hand sanitizer. ? Change your bandage as told by your doctor. ? Leave stitches (sutures), skin glue, or skin tape (adhesive) strips in place. They may need to stay in place for 2 weeks or longer. If tape strips get loose and curl up, you may trim the loose edges. Do not remove tape strips completely unless your doctor says it is okay.  Do not take baths, swim, or use a hot tub until your doctor says it is okay.   You may shower.  Check your surgical cut area every day for signs of infection. Check for: ? More redness, swelling, or pain. ? More fluid or blood. ? Warmth. ? Pus or a bad smell. Activity  Do not drive or use heavy machinery while taking prescription pain medicine.  Do not lift anything that is heavier than 10 lb (4.5 kg) until your doctor says it is okay.  Do not play contact sports until your doctor says it is okay.  Do not drive for 24 hours if you were given a medicine to help you relax (sedative).  Rest as needed. Do not return to work or school until your doctor says it is okay. General instructions  Take over-the-counter and prescription medicines only as told by your doctor.  To prevent or treat constipation while you are taking prescription pain medicine, your doctor may recommend that you: ? Drink enough fluid to keep your pee (urine) clear or pale yellow. ? Take over-the-counter or prescription medicines. ? Eat foods that are high in fiber, such as fresh fruits and vegetables, whole  grains, and beans. ? Limit foods that are high in fat and processed sugars, such as fried and sweet foods. Contact a doctor if:  You develop a rash.  You have more redness, swelling, or pain around your surgical cuts.  You have more fluid or blood coming from your surgical cuts.  Your surgical cuts feel warm to the touch.  You have pus or a bad smell coming from your surgical cuts.  You have a fever.  One or more of your surgical cuts breaks open. Get help right away if:  You have trouble breathing.  You have chest pain.  You have pain that is getting worse in your shoulders.  You faint or feel dizzy when you stand.  You have very bad pain in your belly (abdomen).  You are sick to your stomach (nauseous) for more than one day.  You have throwing up (vomiting) that lasts for more than one day.  You have leg pain.  This information is not intended to replace advice given to you by your health care provider. Make sure you discuss any questions you have with your health care provider. Document Revised: 10/26/2017 Document Reviewed: 05/01/2016 Elsevier Patient Education  2020 [employer].

## 2020-10-22 NOTE — Anesthesia Preprocedure Evaluation (Addendum)
Anesthesia Evaluation  Patient identified by MRN, date of birth, ID band Patient awake    Reviewed: Allergy & Precautions, NPO status , Patient's Chart, lab work & pertinent test results  History of Anesthesia Complications Negative for: history of anesthetic complications  Airway Mallampati: III  TM Distance: >3 FB Neck ROM: Full    Dental  (+) Dental Advisory Given, Teeth Intact   Pulmonary    Pulmonary exam normal breath sounds clear to auscultation       Cardiovascular Exercise Tolerance: Good Normal cardiovascular exam Rhythm:Regular Rate:Normal     Neuro/Psych  Headaches, PSYCHIATRIC DISORDERS Anxiety Depression    GI/Hepatic negative GI ROS, Acute cholecystitis , high LFTs   Endo/Other  Morbid obesity  Renal/GU negative Renal ROS     Musculoskeletal   Abdominal   Peds  Hematology  (+) anemia ,   Anesthesia Other Findings   Reproductive/Obstetrics                            Anesthesia Physical Anesthesia Plan  ASA: III and emergent  Anesthesia Plan: General   Post-op Pain Management:    Induction: Intravenous  PONV Risk Score and Plan: 4 or greater and Ondansetron, Dexamethasone, Midazolam and Metaclopromide  Airway Management Planned: Oral ETT  Additional Equipment:   Intra-op Plan:   Post-operative Plan: Extubation in OR  Informed Consent: I have reviewed the patients History and Physical, chart, labs and discussed the procedure including the risks, benefits and alternatives for the proposed anesthesia with the patient or authorized representative who has indicated his/her understanding and acceptance.     Dental advisory given  Plan Discussed with: Surgeon  Anesthesia Plan Comments:        Anesthesia Quick Evaluation

## 2020-10-22 NOTE — Consult Note (Signed)
[facility] Consult  Reason for Consult: Acute cholecystitis  Referring Physician:  Dr. Arbutus Leas   HPI: Patricia [name] is a 29 y.o. female with acute cholecystitis found on [place] and MRI. She came in with upper abdominal/ chest pain that radiated to her right side and was worked up in the ED. EKG and troponin were negative. She was evaluated and found to have abnormal LFTs. She had CT that showed concern for biliary dilation and [place] also demonstrated this in addition to cholecystitis. MRCP was done this AM and it was negative for choledocholithiasis and positive for cholecystitis. She has had some improvement in her RUQ pain since her antibiotics were started. She had a history of phentermine use but says she has not used since September when she had a drug test come back for work with abnormalities related to the medicine.    She has had some associated nausea and vomiting.  She has not had further vomiting since being in the hospital.   Past Medical History:  Diagnosis Date  . Anemia   . Anxiety   . Bacterial vaginosis   . Chlamydia   . Depression    PP depression after 3rd pregnancy - resolved with therarpy  . Migraine   . Preterm labor   . Preterm labor without delivery in second trimester June 2012   Positive FFN in June  . UTI (lower urinary tract infection)     Past Surgical History:  Procedure Laterality Date  . NO PAST SURGERIES    . TUBAL LIGATION N/A 09/15/2019   Procedure: POST PARTUM TUBAL LIGATION;  Surgeon: Lazaro Arms, MD;  Location: [facility] LD ORS;  Service: Gynecology;  Laterality: N/A;    Family History  Problem Relation Age of Onset  . Diabetes Other        great granmother  . Diabetes Paternal Grandfather   . Hypertension Paternal Grandfather   . Cancer Paternal Grandfather        prostate  . Miscarriages / Stillbirths Maternal Grandmother     Social History   Tobacco Use  . Smoking status: Never Smoker  . Smokeless tobacco: Never Used   Vaping Use  . Vaping Use: Never used  Substance Use Topics  . Alcohol use: Not Currently    Comment: occasionally  . Drug use: No    Medications:  I have reviewed the patient's current medications. Prior to Admission:  Medications Prior to Admission  Medication Sig Dispense Refill Last Dose  . phentermine (ADIPEX-P) 37.5 MG tablet Take 18.75 mg by mouth 2 (two) times daily.   Past Month at Unknown time  . Prenat w/o A-[id number]-[id number]-DHA (CITRANATAL ASSURE) 35-1 & 300 MG tablet One tablet and one capsule daily (Patient taking differently: Take 1 tablet by mouth daily. ) 60 tablet 11 Past Week at Unknown time  . sertraline (ZOLOFT) 50 MG tablet Take 50 mg by mouth daily.   Past Week at Unknown time  . acyclovir (ZOVIRAX) 400 MG tablet Take 1 tablet (400 mg total) by mouth 3 (three) times daily. 90 tablet 2   . cetirizine (ZYRTEC ALLERGY) 10 MG tablet Take 1 tablet (10 mg total) by mouth daily. (Patient not taking: Reported on 10/22/2020) 30 tablet 0 Not Taking at Unknown time  . escitalopram (LEXAPRO) 10 MG tablet Take 1 tablet (10 mg total) by mouth daily. (Patient not taking: Reported on 10/22/2020) 30 tablet 6 Not Taking at Unknown time  . fluticasone (FLONASE) 50 MCG/ACT nasal spray Place 1  spray into both nostrils daily for 14 days. 16 g 0   . hydrOXYzine (ATARAX/VISTARIL) 10 MG tablet Take 1 tablet (10 mg total) by mouth 3 (three) times daily as needed. (Patient not taking: Reported on 10/22/2020) 30 tablet 6 Not Taking at Unknown time  . predniSONE (DELTASONE) 10 MG tablet Take 2 tablets (20 mg total) by mouth daily. (Patient not taking: Reported on 10/22/2020) 15 tablet 0 Completed Course at Unknown time   Scheduled: . Chlorhexidine Gluconate Cloth  6 each Topical Once   And  . Chlorhexidine Gluconate Cloth  6 each Topical Once  . heparin  5,000 Units Subcutaneous Q8H  . pantoprazole (PROTONIX) IV  40 mg Intravenous Q24H   Continuous: . 0.9 % NaCl with KCl 20 mEq / L 75 mL/hr at  10/22/20 1022  . [START ON 10/23/2020] cefoTEtan (CEFOTAN) IV    . piperacillin-tazobactam (ZOSYN)  IV    . piperacillin-tazobactam     ZOX:WRUEAVWUPRN:morphine injection, ondansetron (ZOFRAN) IV  No Known Allergies   ROS:  A comprehensive review of systems was negative except for: Gastrointestinal: positive for abdominal pain, nausea and vomiting  Blood pressure (!) 93/48, pulse 70, temperature 98.1 F (36.7 C), temperature source Oral, resp. rate 18, height 5\' 7"  (1.702 m), weight (!) 155.6 kg, last menstrual period 09/27/2020, SpO2 98 %, not currently breastfeeding. Physical Exam Vitals reviewed.  Constitutional:      Appearance: She is obese.  HENT:     Head: Normocephalic.  Cardiovascular:     Rate and Rhythm: Normal rate and regular rhythm.  Pulmonary:     Effort: Pulmonary effort is normal.  Abdominal:     Palpations: Abdomen is soft.     Tenderness: There is abdominal tenderness in the right upper quadrant. There is no guarding or rebound.     Comments: Minor keloid of infraumbilical incision  Musculoskeletal:        General: Normal range of motion.     Cervical back: Normal range of motion.  Skin:    General: Skin is warm.  Neurological:     General: No focal deficit present.     Mental Status: She is alert and oriented to person, place, and time.  Psychiatric:        Mood and Affect: Mood is anxious.        Behavior: Behavior normal.     Results: Results for orders placed or performed during the hospital encounter of 10/21/20 (from the past 48 hour(s))  Basic metabolic panel     Status: Abnormal   Collection Time: 10/21/20  9:50 PM  Result Value Ref Range   Sodium 135 135 - 145 mmol/L   Potassium 3.6 3.5 - 5.1 mmol/L   Chloride 105 98 - 111 mmol/L   CO2 24 22 - 32 mmol/L   Glucose, Bld 107 (H) 70 - 99 mg/dL    Comment: Glucose reference range applies only to samples taken after fasting for at least 8 hours.   BUN 8 6 - 20 mg/dL   Creatinine, Ser [phone] 0.44 - 1.00  mg/dL   Calcium 8.7 (L) 8.9 - 10.3 mg/dL   GFR, Estimated >19>60 >14>60 mL/min    Comment: (NOTE) Calculated using the CKD-EPI Creatinine Equation (2021)    Anion gap 6 5 - 15    Comment: Performed at [facility], [address] [place], [place] [id number]  CBC     Status: Abnormal   Collection Time: 10/21/20  9:50 PM  Result Value Ref  Range   WBC 4.9 4.0 - 10.5 K/uL   RBC 4.39 3.87 - 5.11 MIL/uL   Hemoglobin 11.7 (L) 12.0 - 15.0 g/dL   HCT 40.9 36 - 46 %   MCV 84.1 80.0 - 100.0 fL   MCH 26.7 26.0 - 34.0 pg   MCHC 31.7 30.0 - 36.0 g/dL   RDW 81.1 91.4 - 78.2 %   Platelets 227 150 - 400 K/uL   nRBC 0.0 0.0 - 0.2 %    Comment: Performed at [facility], [address] [place], [place] [address]  Troponin I (High Sensitivity)     Status: None   Collection Time: 10/21/20  9:50 PM  Result Value Ref Range   Troponin I (High Sensitivity) 2 <18 ng/L    Comment: (NOTE) Elevated high sensitivity troponin I (hsTnI) values and significant  changes across serial measurements may suggest ACS but many other  chronic and acute conditions are known to elevate hsTnI results.  Refer to the "Links" section for chest pain algorithms and additional  guidance. Performed at [facility], [address], [place], [place] [address]   hCG, quantitative, pregnancy     Status: None   Collection Time: 10/21/20  9:50 PM  Result Value Ref Range   hCG, Beta Chain, Quant, S <1 <5 mIU/mL    Comment:          GEST. AGE      CONC.  (mIU/mL)   <=1 WEEK        5 - 50     2 WEEKS       50 - 500     3 WEEKS       100 - 10,000     4 WEEKS     1,000 - 30,000     5 WEEKS     3,500 - 115,000   6-8 WEEKS     12,000 - 270,000    12 WEEKS     15,000 - 220,000        FEMALE AND NON-PREGNANT FEMALE:     LESS THAN 5 mIU/mL Performed at [facility], [address], [place], [place] [address]   Hepatic function panel     Status: Abnormal   Collection Time: 10/21/20  9:50 PM  Result Value Ref Range   Total Protein 7.3 6.5 -  8.1 g/dL   Albumin 3.8 3.5 - 5.0 g/dL   AST 629 (H) 15 - 41 U/L   ALT 339 (H) 0 - 44 U/L   Alkaline Phosphatase 101 38 - 126 U/L   Total Bilirubin 3.1 (H) 0.3 - 1.2 mg/dL   Bilirubin, Direct 1.8 (H) 0.0 - 0.2 mg/dL   Indirect Bilirubin 1.3 (H) 0.3 - 0.9 mg/dL    Comment: Performed at [facility], [address] [place], [place] [address]  Lipase, blood     Status: None   Collection Time: 10/21/20  9:50 PM  Result Value Ref Range   Lipase 27 11 - 51 U/L    Comment: Performed at [facility], [address], [place], [place] [address]  Resp Panel by RT-PCR (Flu A&B, Covid) Nasopharyngeal Swab     Status: None   Collection Time: 10/22/20  1:20 AM   Specimen: Nasopharyngeal Swab; Nasopharyngeal(NP) swabs in vial transport medium  Result Value Ref Range   SARS Coronavirus 2 by RT PCR NEGATIVE NEGATIVE    Comment: (NOTE) SARS-CoV-2 target nucleic acids are NOT DETECTED.  The SARS-CoV-2 RNA is generally detectable in  upper respiratory specimens during the acute phase of infection. The lowest concentration of SARS-CoV-2 viral copies this assay can detect is 138 copies/mL. A negative result does not preclude SARS-Cov-2 infection and should not be used as the sole basis for treatment or other patient management decisions. A negative result may occur with  improper specimen collection/handling, submission of specimen other than nasopharyngeal swab, presence of viral mutation(s) within the areas targeted by this assay, and inadequate number of viral copies(<138 copies/mL). A negative result must be combined with clinical observations, patient history, and epidemiological information. The expected result is Negative.  Fact Sheet for Patients:  BloggerCourse.com  Fact Sheet for Healthcare Providers:  SeriousBroker.it  This test is no t yet approved or cleared by the [place] FDA and  has been authorized for detection and/or diagnosis  of SARS-CoV-2 by FDA under an Emergency Use Authorization (EUA). This EUA will remain  in effect (meaning this test can be used) for the duration of the COVID-19 declaration under Section 564(b)(1) of the Act, 21 U.S.C.section 360bbb-3(b)(1), unless the authorization is terminated  or revoked sooner.       Influenza A by PCR NEGATIVE NEGATIVE   Influenza B by PCR NEGATIVE NEGATIVE    Comment: (NOTE) The Xpert Xpress SARS-CoV-2/FLU/RSV plus assay is intended as an aid in the diagnosis of influenza from Nasopharyngeal swab specimens and should not be used as a sole basis for treatment. Nasal washings and aspirates are unacceptable for Xpert Xpress SARS-CoV-2/FLU/RSV testing.  Fact Sheet for Patients: BloggerCourse.com  Fact Sheet for Healthcare Providers: SeriousBroker.it  This test is not yet approved or cleared by the [place] FDA and has been authorized for detection and/or diagnosis of SARS-CoV-2 by FDA under an Emergency Use Authorization (EUA). This EUA will remain in effect (meaning this test can be used) for the duration of the COVID-19 declaration under Section 564(b)(1) of the Act, 21 U.S.C. section 360bbb-3(b)(1), unless the authorization is terminated or revoked.  Performed at [facility], [address] [place], [place] [address]   Comprehensive metabolic panel     Status: Abnormal   Collection Time: 10/22/20  3:53 AM  Result Value Ref Range   Sodium 136 135 - 145 mmol/L   Potassium 3.6 3.5 - 5.1 mmol/L   Chloride 105 98 - 111 mmol/L   CO2 24 22 - 32 mmol/L   Glucose, Bld 99 70 - 99 mg/dL    Comment: Glucose reference range applies only to samples taken after fasting for at least 8 hours.   BUN 7 6 - 20 mg/dL   Creatinine, Ser 1.91 0.44 - 1.00 mg/dL   Calcium 8.6 (L) 8.9 - 10.3 mg/dL   Total Protein 7.1 6.5 - 8.1 g/dL   Albumin 3.6 3.5 - 5.0 g/dL   AST 478 (H) 15 - 41 U/L   ALT 367 (H) 0 - 44 U/L    Alkaline Phosphatase 110 38 - 126 U/L   Total Bilirubin 3.9 (H) 0.3 - 1.2 mg/dL   GFR, Estimated >29 >56 mL/min    Comment: (NOTE) Calculated using the CKD-EPI Creatinine Equation (2021)    Anion gap 7 5 - 15    Comment: Performed at [facility], [address] [place], [place] [address]  CBC     Status: Abnormal   Collection Time: 10/22/20  3:53 AM  Result Value Ref Range   WBC 4.4 4.0 - 10.5 K/uL   RBC 4.32 3.87 - 5.11 MIL/uL   Hemoglobin 11.7 (L) 12.0 -  15.0 g/dL   HCT 01.7 36 - 46 %   MCV 84.0 80.0 - 100.0 fL   MCH 27.1 26.0 - 34.0 pg   MCHC 32.2 30.0 - 36.0 g/dL   RDW 51.0 25.8 - 52.7 %   Platelets 222 150 - 400 K/uL   nRBC 0.0 0.0 - 0.2 %    Comment: Performed at [facility], [address] [place], [place] [address]  Protime-INR     Status: None   Collection Time: 10/22/20  3:53 AM  Result Value Ref Range   Prothrombin Time 13.3 11.4 - 15.2 seconds   INR 1.1 0.8 - 1.2    Comment: (NOTE) INR goal varies based on device and disease states. Performed at [facility], [address] [place], [place] [address]   APTT     Status: None   Collection Time: 10/22/20  3:53 AM  Result Value Ref Range   aPTT 28 24 - 36 seconds    Comment: Performed at [facility], [address], [place], [place] [address]  Magnesium     Status: None   Collection Time: 10/22/20  3:53 AM  Result Value Ref Range   Magnesium 2.1 1.7 - 2.4 mg/dL    Comment: Performed at [facility], [address], [place], [place] [address]  Phosphorus     Status: None   Collection Time: 10/22/20  3:53 AM  Result Value Ref Range   Phosphorus 3.7 2.5 - 4.6 mg/dL    Comment: Performed at [facility], [address] [place], [place] [address]  CBC     Status: Abnormal   Collection Time: 10/22/20  9:36 AM  Result Value Ref Range   WBC 3.8 (L) 4.0 - 10.5 K/uL   RBC 4.31 3.87 - 5.11 MIL/uL   Hemoglobin 11.6 (L) 12.0 - 15.0 g/dL   HCT 09.3 36 - 46 %   MCV 84.5 80.0 - 100.0 fL   MCH 26.9 26.0 - 34.0 pg   MCHC  31.9 30.0 - 36.0 g/dL   RDW 26.7 12.4 - 58.0 %   Platelets 213 150 - 400 K/uL   nRBC 0.0 0.0 - 0.2 %    Comment: Performed at [facility], [address] [place], [place] [address]   Reviewed imaging- thickened gallbladder and stones, no choledocholithiasis on MRI  DG Chest 2 View  Result Date: 10/21/2020 CLINICAL DATA:  Chest and abdominal pain EXAM: CHEST - 2 VIEW COMPARISON:  06/10/2018 FINDINGS: The heart size and mediastinal contours are within normal limits. Both lungs are clear. The visualized skeletal structures are unremarkable. IMPRESSION: No active cardiopulmonary disease. Electronically Signed   By: Jasmine Pang M.D.   On: 10/21/2020 21:12   CT ABDOMEN PELVIS W CONTRAST  Result Date: 10/22/2020 CLINICAL DATA:  Abdominal pain. Suspected biliary obstruction. Right-sided abdominal pain with nausea and vomiting. EXAM: CT ABDOMEN AND PELVIS WITH CONTRAST TECHNIQUE: Multidetector CT imaging of the abdomen and pelvis was performed using the standard protocol following bolus administration of intravenous contrast. CONTRAST:  OMNIPAQUE IOHEXOL 300 MG/ML  SOLN COMPARISON:  CT chest 06/10/2018.  CT abdomen and pelvis 06/03/2012 FINDINGS: Lower chest: The lung bases are clear. Hepatobiliary: Mild to moderate intra and extrahepatic bile duct dilatation. No obstructing stone or mass is identified. Suggest MRCP to exclude an occult lesion. Suggestion of vague attenuation change in the gallbladder neck probably representing gallstones. No gallbladder wall thickening or infiltration. No focal liver lesions. Pancreas: Unremarkable. No pancreatic ductal dilatation or surrounding inflammatory  changes. Spleen: Normal in size without focal abnormality. Adrenals/Urinary Tract: Adrenal glands are unremarkable. Kidneys are normal, without renal calculi, focal lesion, or hydronephrosis. Bladder is unremarkable. Stomach/Bowel: Stomach is within normal limits. Appendix appears normal. No evidence of bowel  wall thickening, distention, or inflammatory changes. Vascular/Lymphatic: No significant vascular findings are present. No enlarged abdominal or pelvic lymph nodes. Reproductive: Uterus and bilateral adnexa are unremarkable. Other: No free air or free fluid in the abdomen. Several periumbilical hernias containing fat. Musculoskeletal: No acute or significant osseous findings. IMPRESSION: 1. Mild to moderate intra and extrahepatic bile duct dilatation. No obstructing stone or mass is identified. Probable cholelithiasis. Differential diagnosis may include non radiopaque common duct stones, occult mass, or inflammatory process/stricture. Suggest MRCP to exclude an occult lesion. 2. No evidence of bowel obstruction or inflammation. Normal appendix. 3. Several periumbilical hernias containing fat. Electronically Signed   By: Burman Nieves M.D.   On: 10/22/2020 00:11   MR 3D Recon At Scanner  Result Date: 10/22/2020 CLINICAL DATA:  Abdominal pain.  Evaluate for biliary obstruction. EXAM: MRI ABDOMEN WITHOUT AND WITH CONTRAST (INCLUDING MRCP) TECHNIQUE: Multiplanar multisequence MR imaging of the abdomen was performed both before and after the administration of intravenous contrast. Heavily T2-weighted images of the biliary and pancreatic ducts were obtained, and three-dimensional MRCP images were rendered by post processing. CONTRAST:  10mL GADAVIST GADOBUTROL 1 MMOL/ML IV SOLN COMPARISON:  Ultrasound 10/22/2020 FINDINGS: Lower chest: No acute findings. Hepatobiliary: Mild diffuse hepatic steatosis. Within the limitations of unenhanced technique there is no focal liver abnormality. The gallbladder wall appears mildly thickened measuring up to 4 mm. Stone within the proximal gallbladder measures 1.5 cm. No biliary ductal dilatation. The common bile duct measures 5 mm in diameter. No signs of choledocholithiasis. Pancreas: No mass, inflammatory changes, or other parenchymal abnormality identified. Spleen:  Within  normal limits in size and appearance. Adrenals/Urinary Tract: Normal appearance of the adrenal glands. The kidneys are unremarkable. Stomach/Bowel: Visualized portions within the abdomen are unremarkable. Vascular/Lymphatic: No pathologically enlarged lymph nodes identified. No abdominal aortic aneurysm demonstrated. Other:  No free fluid or fluid collections. Musculoskeletal: No suspicious bone lesions identified. IMPRESSION: 1. Gallstones with gallbladder wall thickening. Findings consistent with acute cholecystitis. 2. No biliary ductal dilatation or signs of choledocholithiasis. 3. Mild diffuse hepatic steatosis. Electronically Signed   By: Signa Kell M.D.   On: 10/22/2020 10:06   MR ABDOMEN MRCP W WO CONTAST  Result Date: 10/22/2020 CLINICAL DATA:  Abdominal pain.  Evaluate for biliary obstruction. EXAM: MRI ABDOMEN WITHOUT AND WITH CONTRAST (INCLUDING MRCP) TECHNIQUE: Multiplanar multisequence MR imaging of the abdomen was performed both before and after the administration of intravenous contrast. Heavily T2-weighted images of the biliary and pancreatic ducts were obtained, and three-dimensional MRCP images were rendered by post processing. CONTRAST:  10mL GADAVIST GADOBUTROL 1 MMOL/ML IV SOLN COMPARISON:  Ultrasound 10/22/2020 FINDINGS: Lower chest: No acute findings. Hepatobiliary: Mild diffuse hepatic steatosis. Within the limitations of unenhanced technique there is no focal liver abnormality. The gallbladder wall appears mildly thickened measuring up to 4 mm. Stone within the proximal gallbladder measures 1.5 cm. No biliary ductal dilatation. The common bile duct measures 5 mm in diameter. No signs of choledocholithiasis. Pancreas: No mass, inflammatory changes, or other parenchymal abnormality identified. Spleen:  Within normal limits in size and appearance. Adrenals/Urinary Tract: Normal appearance of the adrenal glands. The kidneys are unremarkable. Stomach/Bowel: Visualized portions within  the abdomen are unremarkable. Vascular/Lymphatic: No pathologically enlarged lymph nodes identified. No abdominal aortic  aneurysm demonstrated. Other:  No free fluid or fluid collections. Musculoskeletal: No suspicious bone lesions identified. IMPRESSION: 1. Gallstones with gallbladder wall thickening. Findings consistent with acute cholecystitis. 2. No biliary ductal dilatation or signs of choledocholithiasis. 3. Mild diffuse hepatic steatosis. Electronically Signed   By: Signa Kell M.D.   On: 10/22/2020 10:06   US Abdomen Limited RUQ (LIVER/GB)  Result Date: 10/22/2020 CLINICAL DATA:  29 year old female with right upper quadrant pain. Nausea vomiting today. Abnormal LFTs. EXAM: ULTRASOUND ABDOMEN LIMITED RIGHT UPPER QUADRANT COMPARISON:  CT Abdomen and Pelvis 2346 hours yesterday. FINDINGS: Gallbladder: Shadowing gallstones (image 4) individually estimated at up to 15 mm diameter. Partially contracted gallbladder, with wall thickening up to 5 mm. No pericholecystic fluid, but positive sonographic Murphy sign elicited. Common bile duct: Diameter: 7 mm, mildly dilated. No filling defect identified within the visible duct (images 17 through 19). Liver: Liver echogenicity within normal limits. Central intrahepatic biliary ductal dilatation better demonstrated by CT. No discrete liver lesion. Portal vein is patent on color Doppler imaging with normal direction of blood flow towards the liver. Other: Negative visible right kidney. IMPRESSION: 1. Gallstones, gallbladder wall thickening, biliary ductal dilatation (as seen by CT) and positive sonographic Murphy sign. 2. This constellation is suspicious for Choledocholithiasis with possible superimposed Acute Cholecystitis. Electronically Signed   By: Odessa Fleming M.D.   On: 10/22/2020 08:24     Assessment & Plan:  SUKAINA TOOTHAKER is a 29 y.o. female with some depression and history of phentermine use but has not been on it since September. She comes in with acute  cholecystitis confirmed on imaging. She is nervous about getting her gallbladder removed. She has had a tubal ligation in the past.   -PLAN: I counseled the patient about the indication, risks and benefits of laparoscopic cholecystectomy.  She understands there is a very small chance for bleeding, infection, injury to normal structures (including common bile duct), conversion to open surgery, persistent symptoms, evolution of postcholecystectomy diarrhea, need for secondary interventions, anesthesia reaction, cardiopulmonary issues and other risks not specifically detailed here. I described the expected recovery, the plan for follow-up and the restrictions during the recovery phase.  All questions were answered.  COVID negative.  Discussed seriousness of phentermine use and why we have to know if she has been taking it. She re-confirms no uses since September.    All questions were answered to the satisfaction of the patient.  Discussed with Dr. Arbutus Leas and Dr. Pilar Plate.   Lucretia Roers 10/22/2020, 12:42 PM

## 2020-10-22 NOTE — Progress Notes (Signed)
Patient screaming crying in pain at this time. No relief with both PRN pain meds.  MD notified for pain management. Pt given warm compress to help with discomfort. No relief.   Will continue to monitor.

## 2020-10-22 NOTE — Progress Notes (Signed)
Patient complained of chest pain and constant abdominal pain. New orders for dilaudid administered. EKG ordered. Will continue to monitor.

## 2020-10-22 NOTE — Consult Note (Addendum)
Maylon Peppers, M.D. Gastroenterology & Hepatology                                           Patient Name: Patricia [name] Account #: @FLAACCTNO @   MRN: [id number] Admission Date: 10/21/2020 Date of Evaluation:  10/22/2020 Time of Evaluation: 10:38 AM   Referring Physician: Dr. Bernadette Hoit  Chief Complaint: Abdominal pain  HPI:  This is a 29 y.o. female with history of anxiety, depression and obesity, who came to the hospital for evaluation of new onset abdominal pain.  The patient states that she has been presented right upper quadrant abdominal pain since yesterday.  She reported that 2 days ago she started presenting right parasternal chest pain which made her concerned but she was seen by a provider.told her this was related to her anxiety.  However, she noticed new onset pain in her right upper quadrant described as a constant stabbing pain that worsened during the day.  This was followed by nausea and multiple episodes of bilious vomiting with poor oral tolerance.  Due to this, the patient decided to come to the hospital for further evaluation.  The patient states that she has never had similar symptoms in the past.  She denies having any fever or chills, melena, hematemesis, diarrhea, jaundice, pruritus.  Patient has been on phentermine for weight loss but has not lost a consistent amount recently.  She reported that she has not been taking it recently.  In the ED, she was HD stable and afebrile. Labs were remarkable for presence of elevated aminotransferases with AST 330, ALT 339, total bilirubin 3.1, alkaline phosphatase 101, normal renal function electrolytes, lipase was normal 27, CBC had presence of normal white count of 4.9 mild anemia 11.7 MCV 84, platelets 227.Initial abdominal CT showed presence of mild to moderate intra and extrahepatic ductal dilation but no stone or mass were identified at that time.  Subsequent liver ultrasound showed presence of a 7 mm CBD without any  filling defect, presence of positive Murphy sign with wall thickening in the gallbladder up to 5 mm and multiple gallstones which was consistent with cholecystitis.  Finally, the patient underwent an MRCP today that showed gallstones with gallbladder thickening consistent with cholecystitis but no presence of choledocholithiasis or ductal dilation (5 mm).  I personally reviewed all these images.  FHx: neg for any gastrointestinal/liver disease, paternal side prostate cancer, mother thyroid cancer Social: neg smoking, alcohol or illicit drug use Surgical: tubal ligation  Past Medical History: SEE CHRONIC ISSSUES: Past Medical History:  Diagnosis Date  . Anemia   . Anxiety   . Bacterial vaginosis   . Chlamydia   . Depression    PP depression after 3rd pregnancy - resolved with therarpy  . Migraine   . Preterm labor   . Preterm labor without delivery in second trimester June 2012   Positive FFN in June  . UTI (lower urinary tract infection)    Past Surgical History:  Past Surgical History:  Procedure Laterality Date  . NO PAST SURGERIES    . TUBAL LIGATION N/A 09/15/2019   Procedure: POST PARTUM TUBAL LIGATION;  Surgeon: Florian Buff, MD;  Location: [facility];  Service: Gynecology;  Laterality: N/A;   Family History:  Family History  Problem Relation Age of Onset  . Diabetes Other        great granmother  .  Diabetes Paternal Grandfather   . Hypertension Paternal Grandfather   . Cancer Paternal Grandfather        prostate  . Miscarriages / Stillbirths Maternal Grandmother    Social History:  Social History   Tobacco Use  . Smoking status: Never Smoker  . Smokeless tobacco: Never Used  Vaping Use  . Vaping Use: Never used  Substance Use Topics  . Alcohol use: Not Currently    Comment: occasionally  . Drug use: No    Home Medications:  Prior to Admission medications   Medication Sig Start Date End Date Taking? Authorizing Provider  phentermine (ADIPEX-P) 37.5 MG  tablet Take 18.75 mg by mouth 2 (two) times daily. 08/03/20  Yes [provider]  Prenat w/o A-FeCbGl-DSS-FA-DHA (CITRANATAL ASSURE) 35-1 & 300 MG tablet One tablet and one capsule daily Patient taking differently: Take 1 tablet by mouth daily.  02/12/19  Yes Roma Schanz, CNM  sertraline (ZOLOFT) 50 MG tablet Take 50 mg by mouth daily. 06/14/20  Yes [provider]  acyclovir (ZOVIRAX) 400 MG tablet Take 1 tablet (400 mg total) by mouth 3 (three) times daily. 08/07/19   Cresenzo-Dishmon, Joaquim Lai, CNM  cetirizine (ZYRTEC ALLERGY) 10 MG tablet Take 1 tablet (10 mg total) by mouth daily. Patient not taking: Reported on 10/22/2020 04/02/20   Emerson Monte, FNP  escitalopram (LEXAPRO) 10 MG tablet Take 1 tablet (10 mg total) by mouth daily. Patient not taking: Reported on 10/22/2020 11/05/19 11/04/20  Estill Dooms, NP  fluticasone [facility]) 50 MCG/ACT nasal spray Place 1 spray into both nostrils daily for 14 days. 04/02/20 04/16/20  Avegno, Darrelyn Hillock, FNP  hydrOXYzine (ATARAX/VISTARIL) 10 MG tablet Take 1 tablet (10 mg total) by mouth 3 (three) times daily as needed. Patient not taking: Reported on 10/22/2020 11/05/19   Estill Dooms, NP           Inpatient Medications:  Current Facility-Administered Medications:  .  0.9 % NaCl with KCl 20 mEq/ L  infusion, , Intravenous, Continuous, Tat, David, MD, Last Rate: 75 mL/hr at 10/22/20 1022, New Bag at 10/22/20 1022 .  heparin injection 5,000 Units, 5,000 Units, Subcutaneous, Q8H, Adefeso, Oladapo, DO, 5,000 Units at 10/22/20 0618 .  morphine 2 MG/ML injection 2 mg, 2 mg, Intravenous, Q4H PRN, Adefeso, Oladapo, DO .  ondansetron (ZOFRAN) injection 4 mg, 4 mg, Intravenous, Q6H PRN, Adefeso, Oladapo, DO .  pantoprazole (PROTONIX) injection 40 mg, 40 mg, Intravenous, Q24H, Adefeso, Oladapo, DO, 40 mg at 10/22/20 0092  Current Outpatient Medications:  .  phentermine (ADIPEX-P) 37.5 MG tablet, Take 18.75 mg by mouth 2 (two)  times daily., Disp: , Rfl:  .  Prenat w/o A-FeCbGl-DSS-FA-DHA (CITRANATAL ASSURE) 35-1 & 300 MG tablet, One tablet and one capsule daily (Patient taking differently: Take 1 tablet by mouth daily. ), Disp: 60 tablet, Rfl: 11 .  sertraline (ZOLOFT) 50 MG tablet, Take 50 mg by mouth daily., Disp: , Rfl:  .  acyclovir (ZOVIRAX) 400 MG tablet, Take 1 tablet (400 mg total) by mouth 3 (three) times daily., Disp: 90 tablet, Rfl: 2 .  cetirizine (ZYRTEC ALLERGY) 10 MG tablet, Take 1 tablet (10 mg total) by mouth daily. (Patient not taking: Reported on 10/22/2020), Disp: 30 tablet, Rfl: 0 .  escitalopram (LEXAPRO) 10 MG tablet, Take 1 tablet (10 mg total) by mouth daily. (Patient not taking: Reported on 10/22/2020), Disp: 30 tablet, Rfl: 6 .  fluticasone (FLONASE) 50 MCG/ACT nasal spray, Place 1 spray into both nostrils  daily for 14 days., Disp: 16 g, Rfl: 0 .  hydrOXYzine (ATARAX/VISTARIL) 10 MG tablet, Take 1 tablet (10 mg total) by mouth 3 (three) times daily as needed. (Patient not taking: Reported on 10/22/2020), Disp: 30 tablet, Rfl: 6 .  predniSONE (DELTASONE) 10 MG tablet, Take 2 tablets (20 mg total) by mouth daily. (Patient not taking: Reported on 10/22/2020), Disp: 15 tablet, Rfl: 0 Allergies: Patient has no known allergies.  Complete [name]: GENERAL: negative for malaise, night sweats HEENT: No changes in hearing or vision, no nose bleeds or other nasal problems. NECK: Negative for lumps, goiter, pain and significant neck swelling RESPIRATORY: Negative for cough, wheezing CARDIOVASCULAR: Negative for chest pain, leg swelling, palpitations, orthopnea GI: SEE HPI MUSCULOSKELETAL: Negative for joint pain or swelling, back pain, and muscle pain. SKIN: Negative for lesions, rash PSYCH: Negative for sleep disturbance, mood disorder and recent psychosocial stressors. HEMATOLOGY Negative for prolonged bleeding, bruising easily, and swollen nodes. ENDOCRINE: Negative for cold or heat  intolerance, polyuria, polydipsia and goiter. NEURO: negative for tremor, gait imbalance, syncope and seizures. The remainder of the review of systems is noncontributory.  Physical Exam: BP (!) 105/48   Pulse (!) 57   Temp 98.1 F (36.7 C) (Oral)   Resp 16   Ht 5' 7"  (1.702 m)   Wt (!) 155.6 kg   LMP 09/27/2020   SpO2 98%   BMI 53.72 kg/m  GENERAL: The patient is AO x3, in no acute distress. Obese. HEENT: Head is normocephalic and atraumatic. EOMI are intact. Mouth is well hydrated and without lesions. NECK: Supple. No masses LUNGS: Clear to auscultation. No presence of rhonchi/wheezing/rales. Adequate chest expansion HEART: RRR, normal s1 and s2. ABDOMEN: severely tender to palpation in RUQ and R flank, no guarding, no peritoneal signs, and nondistended. BS +. No masses. EXTREMITIES: Without any cyanosis, clubbing, rash, lesions or edema. NEUROLOGIC: AOx3, no focal motor deficit. SKIN: no jaundice, no rashes  Laboratory Data CBC:     Component Value Date/Time   WBC 3.8 (L) 10/22/2020 0936   RBC 4.31 10/22/2020 0936   HGB 11.6 (L) 10/22/2020 0936   HGB 10.1 (L) 06/16/2019 0858   HCT 36.4 10/22/2020 0936   HCT 31.1 (L) 06/16/2019 0858   PLT 213 10/22/2020 0936   PLT 198 06/16/2019 0858   MCV 84.5 10/22/2020 0936   MCV 83 06/16/2019 0858   MCH 26.9 10/22/2020 0936   MCHC 31.9 10/22/2020 0936   RDW 13.3 10/22/2020 0936   RDW 13.3 06/16/2019 0858   LYMPHSABS 1.7 02/12/2019 1043   MONOABS 0.4 06/10/2018 0623   EOSABS 0.1 02/12/2019 1043   BASOSABS 0.0 02/12/2019 1043   COAG:  Lab Results  Component Value Date   INR 1.1 10/22/2020    BMP:  BMP Latest Ref Rng & Units 10/22/2020 10/21/2020 06/10/2018  Glucose 70 - 99 mg/dL 99 107(H) 98  BUN 6 - 20 mg/dL 7 8 11   Creatinine 0.44 - 1.00 mg/dL 0.82 0.89 1.09(H)  Sodium 135 - 145 mmol/L 136 135 138  Potassium 3.5 - 5.1 mmol/L 3.6 3.6 4.1  Chloride 98 - 111 mmol/L 105 105 105  CO2 22 - 32 mmol/L 24 24 27   Calcium 8.9 -  10.3 mg/dL 8.6(L) 8.7(L) 8.8(L)    HEPATIC:  Hepatic Function Latest Ref Rng & Units 10/22/2020 10/21/2020 09/19/2016  Total Protein 6.5 - 8.1 g/dL 7.1 7.3 7.8  Albumin 3.5 - 5.0 g/dL 3.6 3.8 4.2  AST 15 - 41 U/L 305(H) 330(H)  23  ALT 0 - 44 U/L 367(H) 339(H) 21  Alk Phosphatase 38 - 126 U/L 110 101 45  Total Bilirubin 0.3 - 1.2 mg/dL 3.9(H) 3.1(H) 0.6  Bilirubin, Direct 0.0 - 0.2 mg/dL - 1.8(H) -    CARDIAC: No results found for: CKTOTAL, CKMB, CKMBINDEX, TROPONINI   Imaging: I personally reviewed and interpreted the available imaging.  Assessment & Plan: 29 y.o. female with history of anxiety, depression and obesity, who came to the hospital for evaluation of new onset abdominal pain.  The patient has clinical and radiological findings consistent with acute cholecystitis.  She was found to have elevated aminotransferases initially with presence of some intra and extrahepatic ductal dilation and CT scan.  However, her MRCP was negative for any ductal dilation or stones.  It is likely that she passed a stone which led to the elevated liver enzymes.  At this point, no further intervention is warranted from the gastrointestinal perspective, she will need to be seen by general surgery for cholecystectomy.  If there is still concern for choledocholithiasis, an intraoperative cholangiogram can be performed to further evaluate this.  For now, she will need to continue with antibiotic coverage with Zosyn.  # Acute cholecystitis # Elevated LFTs - General surgery consultation - Consider IOC if concern for elevated aminotransferases - No need for endoscopic evaluation at this point - Continue Zosyn - GI service will sign-off, please call us back if you have any more questions.  Harvel Quale, MD Gastroenterology and Hepatology [facility]   Note: Occasional unusual wording and randomly placed punctuation marks may result from the use of speech  recognition technology to transcribe this document

## 2020-10-22 NOTE — Progress Notes (Signed)
PROGRESS NOTE  Patricia [name] NIO:[id number] DOB: 1991-03-25 DOA: 10/21/2020 PCP: [facility] [place]  Brief History:  29 year old female with a history of anxiety/depression/obesity presents now with acute onset of right upper quadrant and epigastric abdominal pain.  The patient work-up in the early morning of 10/21/2020 around 5 AM with chest pain.  EMS was activated.  She felt that the chest pain may have been anxiety related and subsequently it subsided.  After evaluation, EMS left the house.  Later in the morning around 11 AM, the patient developed right upper quadrant and epigastric abdominal pain that was stabbing in nature and constant.  She also had some associated nausea and vomiting x2 episodes after drinking and eating.  She denies any fevers, chills, shortness of breath, hemoptysis, hematemesis, diarrhea, hematochezia, melena.  She denies any new medications.  She reactivated EMS, the patient was brought to the hospital for further evaluation. In the emergency department, the patient was afebrile hemodynamically stable with oxygen saturation 99% room air.  BMP was unremarkable.  AST 305, ALT 367, alk phosphatase 110, total bilirubin 3.9, lipase 27.  WBC 4.9, hemoglobin 11.7, platelets 227,000.  EKG shows sinus rhythm with nonspecific T wave inversions.  CT abdomen with pelvis showed mild to moderate intrahepatic and extrahepatic biliary ductal dilatation.  Right upper quadrant ultrasound showed cholelithiasis with gallbladder wall thickening and biliary ductal dilatation.  GI and general surgery were consulted to assist with management.  Assessment/Plan: Cholecystitis -Concern for choledocholithiasis -Start IV Zosyn -Follow-up MRCP -GI consult -General surgery consult -Judicious pain control -Continue IV fluids  Atypical chest pain -Resolved -Personally reviewed EKG--sinus rhythm, nonspecific T wave change -Personally reviewed chest x-ray--no infiltrates  or edema  Morbid obesity -BMI 53.72 -Lifestyle modification  Depression/anxiety -Restart sertraline 50 mg daily once able to tolerate p.o.   Total time spent 35 minutes.  Greater than 50% spent face to face counseling and coordinating care.    Status is: Inpatient  Remains inpatient appropriate because:Unsafe d/c plan and IV treatments appropriate due to intensity of illness or inability to take PO   Dispo: The patient is from: Home              Anticipated d/c is to: Home              Anticipated d/c date is: 2 days              Patient currently is not medically stable to d/c.        Family Communication:   No Family at bedside  Consultants:  GI, general surgery  Code Status:  FULL   DVT Prophylaxis:  [place] Heparin    Procedures: As Listed in Progress Note Above  Antibiotics: None       Subjective: Patient complains of epigastric and RUQ abd pain.  Denies cp, sob, n/v/d, hematochezia, melena, f/c  Objective: Vitals:   10/22/20 0600 10/22/20 0647 10/22/20 0700 10/22/20 0830  BP: 104/66  (!) 101/59 (!) 107/51  Pulse: 73  60 67  Resp: 15  15 (!) 9  Temp:  98.1 F (36.7 C)    TempSrc:  Oral    SpO2: 95%  98% 100%  Weight:      Height:       No intake or output data in the 24 hours ending 10/22/20 0859 Weight change:  Exam:   General:  Pt is alert, follows commands appropriately, not in acute distress  HEENT: No icterus, No thrush, No neck mass, [place]/AT  Cardiovascular: RRR, S1/S2, no rubs, no gallops  Respiratory: diminished BS, bibasilar crackles. No wheeze  Abdomen: Soft/+BS, RUQ, epigastric tender, non distended, no guarding  Extremities: No edema, No lymphangitis, No petechiae, No rashes, no synovitis   Data Reviewed: I have personally reviewed following labs and imaging studies Basic Metabolic Panel: Recent Labs  Lab 10/21/20 2150 10/22/20 0353  NA 135 136  K 3.6 3.6  CL 105 105  CO2 24 24  GLUCOSE 107* 99  BUN 8 7    CREATININE 0.89 0.82  CALCIUM 8.7* 8.6*  MG  --  2.1  PHOS  --  3.7   Liver Function Tests: Recent Labs  Lab 10/21/20 2150 10/22/20 0353  AST 330* 305*  ALT 339* 367*  ALKPHOS 101 110  BILITOT 3.1* 3.9*  PROT 7.3 7.1  ALBUMIN 3.8 3.6   Recent Labs  Lab 10/21/20 2150  LIPASE 27   No results for input(s): AMMONIA in the last 168 hours. Coagulation Profile: Recent Labs  Lab 10/22/20 0353  INR 1.1   CBC: Recent Labs  Lab 10/21/20 2150 10/22/20 0353  WBC 4.9 4.4  HGB 11.7* 11.7*  HCT 36.9 36.3  MCV 84.1 84.0  PLT 227 222   Cardiac Enzymes: No results for input(s): CKTOTAL, CKMB, CKMBINDEX, TROPONINI in the last 168 hours. BNP: Invalid input(s): POCBNP CBG: No results for input(s): GLUCAP in the last 168 hours. HbA1C: No results for input(s): HGBA1C in the last 72 hours. Urine analysis:    Component Value Date/Time   COLORURINE YELLOW 01/18/2018 1427   APPEARANCEUR Clear 02/12/2019 1043   LABSPEC 1.023 01/18/2018 1427   PHURINE 5.0 01/18/2018 1427   GLUCOSEU Negative 09/11/2019 0909   HGBUR NEGATIVE 01/18/2018 1427   BILIRUBINUR Negative 02/12/2019 1043   KETONESUR 5 (A) 01/18/2018 1427   PROTEINUR Negative 02/12/2019 1043   PROTEINUR NEGATIVE 01/18/2018 1427   UROBILINOGEN 0.2 09/10/2015 2308   NITRITE n 09/11/2019 0909   NITRITE Negative 02/12/2019 1043   NITRITE NEGATIVE 01/18/2018 1427   LEUKOCYTESUR Negative 09/11/2019 0909   LEUKOCYTESUR Trace (A) 02/12/2019 1043   Sepsis Labs: @LABRCNTIP (procalcitonin:4,lacticidven:4) ) Recent Results (from the past 240 hour(s))  Resp Panel by RT-PCR (Flu A&B, Covid) Nasopharyngeal Swab     Status: None   Collection Time: 10/22/20  1:20 AM   Specimen: Nasopharyngeal Swab; Nasopharyngeal(NP) swabs in vial transport medium  Result Value Ref Range Status   SARS Coronavirus 2 by RT PCR NEGATIVE NEGATIVE Final    Comment: (NOTE) SARS-CoV-2 target nucleic acids are NOT DETECTED.  The SARS-CoV-2 RNA is  generally detectable in upper respiratory specimens during the acute phase of infection. The lowest concentration of SARS-CoV-2 viral copies this assay can detect is 138 copies/mL. A negative result does not preclude SARS-Cov-2 infection and should not be used as the sole basis for treatment or other patient management decisions. A negative result may occur with  improper specimen collection/handling, submission of specimen other than nasopharyngeal swab, presence of viral mutation(s) within the areas targeted by this assay, and inadequate number of viral copies(<138 copies/mL). A negative result must be combined with clinical observations, patient history, and epidemiological information. The expected result is Negative.  Fact Sheet for Patients:  EntrepreneurPulse.com.au  Fact Sheet for Healthcare Providers:  IncredibleEmployment.be  This test is no t yet approved or cleared by the [place] FDA and  has been authorized for detection and/or diagnosis of SARS-CoV-2 by FDA under an  Emergency Use Authorization (EUA). This EUA will remain  in effect (meaning this test can be used) for the duration of the COVID-19 declaration under Section 564(b)(1) of the Act, 21 U.S.C.section 360bbb-3(b)(1), unless the authorization is terminated  or revoked sooner.       Influenza A by PCR NEGATIVE NEGATIVE Final   Influenza B by PCR NEGATIVE NEGATIVE Final    Comment: (NOTE) The Xpert Xpress SARS-CoV-2/FLU/RSV plus assay is intended as an aid in the diagnosis of influenza from Nasopharyngeal swab specimens and should not be used as a sole basis for treatment. Nasal washings and aspirates are unacceptable for Xpert Xpress SARS-CoV-2/FLU/RSV testing.  Fact Sheet for Patients: EntrepreneurPulse.com.au  Fact Sheet for Healthcare Providers: IncredibleEmployment.be  This test is not yet approved or cleared by the [place] FDA and has been authorized for detection and/or diagnosis of SARS-CoV-2 by FDA under an Emergency Use Authorization (EUA). This EUA will remain in effect (meaning this test can be used) for the duration of the COVID-19 declaration under Section 564(b)(1) of the Act, 21 U.S.C. section 360bbb-3(b)(1), unless the authorization is terminated or revoked.  Performed at [facility], [address] [place], [place] [address]      Scheduled Meds: . heparin  5,000 Units Subcutaneous Q8H  . pantoprazole (PROTONIX) IV  40 mg Intravenous Q24H   Continuous Infusions:  Procedures/Studies: DG Chest 2 View  Result Date: 10/21/2020 CLINICAL DATA:  Chest and abdominal pain EXAM: CHEST - 2 VIEW COMPARISON:  06/10/2018 FINDINGS: The heart size and mediastinal contours are within normal limits. Both lungs are clear. The visualized skeletal structures are unremarkable. IMPRESSION: No active cardiopulmonary disease. Electronically Signed   By: Donavan Foil M.D.   On: 10/21/2020 21:12   CT ABDOMEN PELVIS W CONTRAST  Result Date: 10/22/2020 CLINICAL DATA:  Abdominal pain. Suspected biliary obstruction. Right-sided abdominal pain with nausea and vomiting. EXAM: CT ABDOMEN AND PELVIS WITH CONTRAST TECHNIQUE: Multidetector CT imaging of the abdomen and pelvis was performed using the standard protocol following bolus administration of intravenous contrast. CONTRAST:  124m OMNIPAQUE IOHEXOL 300 MG/ML  SOLN COMPARISON:  CT chest 06/10/2018.  CT abdomen and pelvis 06/03/2012 FINDINGS: Lower chest: The lung bases are clear. Hepatobiliary: Mild to moderate intra and extrahepatic bile duct dilatation. No obstructing stone or mass is identified. Suggest MRCP to exclude an occult lesion. Suggestion of vague attenuation change in the gallbladder neck probably representing gallstones. No gallbladder wall thickening or infiltration. No focal liver lesions. Pancreas: Unremarkable. No pancreatic ductal dilatation or  surrounding inflammatory changes. Spleen: Normal in size without focal abnormality. Adrenals/Urinary Tract: Adrenal glands are unremarkable. Kidneys are normal, without renal calculi, focal lesion, or hydronephrosis. Bladder is unremarkable. Stomach/Bowel: Stomach is within normal limits. Appendix appears normal. No evidence of bowel wall thickening, distention, or inflammatory changes. Vascular/Lymphatic: No significant vascular findings are present. No enlarged abdominal or pelvic lymph nodes. Reproductive: Uterus and bilateral adnexa are unremarkable. Other: No free air or free fluid in the abdomen. Several periumbilical hernias containing fat. Musculoskeletal: No acute or significant osseous findings. IMPRESSION: 1. Mild to moderate intra and extrahepatic bile duct dilatation. No obstructing stone or mass is identified. Probable cholelithiasis. Differential diagnosis may include non radiopaque common duct stones, occult mass, or inflammatory process/stricture. Suggest MRCP to exclude an occult lesion. 2. No evidence of bowel obstruction or inflammation. Normal appendix. 3. Several periumbilical hernias containing fat. Electronically Signed   By: WLucienne CapersM.D.   On: 10/22/2020 00:11   [place]domen  Limited RUQ (LIVER/GB)  Result Date: 10/22/2020 CLINICAL DATA:  29 year old female with right upper quadrant pain. Nausea vomiting today. Abnormal LFTs. EXAM: ULTRASOUND ABDOMEN LIMITED RIGHT UPPER QUADRANT COMPARISON:  CT Abdomen and Pelvis 2346 hours yesterday. FINDINGS: Gallbladder: Shadowing gallstones (image 4) individually estimated at up to 15 mm diameter. Partially contracted gallbladder, with wall thickening up to 5 mm. No pericholecystic fluid, but positive sonographic Murphy sign elicited. Common bile duct: Diameter: 7 mm, mildly dilated. No filling defect identified within the visible duct (images 17 through 19). Liver: Liver echogenicity within normal limits. Central intrahepatic biliary ductal  dilatation better demonstrated by CT. No discrete liver lesion. Portal vein is patent on color Doppler imaging with normal direction of blood flow towards the liver. Other: Negative visible right kidney. IMPRESSION: 1. Gallstones, gallbladder wall thickening, biliary ductal dilatation (as seen by CT) and positive sonographic Murphy sign. 2. This constellation is suspicious for Choledocholithiasis with possible superimposed Acute Cholecystitis. Electronically Signed   By: Genevie Ann M.D.   On: 10/22/2020 08:24    Orson Eva, DO  [facility]  If 7PM-7AM, please contact night-coverage [email or web address] Password [email or web address] 10/22/2020, 8:59 AM   LOS: 0 days

## 2020-10-22 NOTE — Op Note (Signed)
Operative Note   Preoperative Diagnosis: Acute cholecystitis    Postoperative Diagnosis: Same   Procedure(s) Performed: Laparoscopic cholecystectomy   Surgeon: Lillia Abed C. Henreitta Leber, MD   Assistants: No qualified resident was available   Anesthesia: General endotracheal   Anesthesiologist: Molli Barrows, MD    Specimens: Gallbladder    Estimated Blood Loss: Minimal    Blood Replacement: None    Complications: None    Operative Findings: Distended gallbladder with edema consistent with cholecystitis    Procedure: The patient was taken to the operating room and placed supine. General endotracheal anesthesia was induced. Intravenous antibiotics were administered per protocol. An orogastric tube positioned to decompress the stomach. The abdomen was prepared and draped in the usual sterile fashion.    A supraumbilical incision was made and a Veress technique was utilized to achieve pneumoperitoneum to 15 mmHg with carbon dioxide. A 11 mm optiview port was placed through the supraumbilical region, and a 10 mm 0-degree operative laparoscope was introduced. The area underlying the trocar and Veress needle were inspected and without evidence of injury.  Remaining trocars were placed under direct vision. Two 5 mm ports were placed in the right abdomen, between the anterior axillary and midclavicular line.  A final 11 mm port was placed through the mid-epigastrium, near the falciform ligament. She has an omental band in the infraumbilical region that was taken down with cautery.    The gallbladder fundus was elevated cephalad and the infundibulum was retracted to the patient's right. The gallbladder/cystic duct junction was skeletonized. The cystic artery noted in the triangle of Calot and was also skeletonized.  We then continued liberal medial and lateral dissection until the critical view of safety was achieved.    The cystic duct and cystic artery were triply clipped and divided. The  gallbladder was then dissected from the liver bed with electrocautery. The specimen was placed in an Endopouch and was retrieved through the epigastric site.   Final inspection revealed acceptable hemostasis. Surgical SNOW was placed in the gallbladder bed.  Trocars were removed and pneumoperitoneum was released. The epigastric and umbilical port sites were smaller than my finger. Skin incisions were closed with 4-0 Monocryl subcuticular sutures and Dermabond. The patient was awakened from anesthesia and extubated without complication.    Algis Greenhouse, MD [facility] [name] [place], [place] [address] [phone] (office)

## 2020-10-22 NOTE — Transfer of Care (Signed)
Immediate Anesthesia Transfer of Care Note  Patient: Patricia [name]  Procedure(s) Performed: LAPAROSCOPIC CHOLECYSTECTOMY (N/A Abdomen)  Patient Location: PACU  Anesthesia Type:General  Level of Consciousness: awake, alert , oriented and sedated  Airway & Oxygen Therapy: Patient Spontanous Breathing and Patient connected to nasal cannula oxygen  Post-op Assessment: Report given to RN and Post -op Vital signs reviewed and stable  Post vital signs: Reviewed and stable  Last Vitals:  Vitals Value Taken Time  BP 97/55 10/22/20 1515  Temp 98.4   Pulse 102 10/22/20 1520  Resp 19 10/22/20 1520  SpO2 100 % 10/22/20 1520  Vitals shown include unvalidated device data.  Last Pain:  Vitals:   10/22/20 1301  TempSrc: Oral  PainSc:          Complications: No complications documented.

## 2020-10-22 NOTE — ED Provider Notes (Signed)
Patient left at change of shift to get results of her CT scan.  Patient presented with right-sided chest and then abdominal pain with elevation of her LFTs.  I have checked her for radiology and MRI is going to be here on the 26th.  I will discuss with gastroenterology.  1:15 AM Dr. Jena Gauss, gastroenterology, we discussed patient.  He states that there will be no ERCP capability over the weekend however he feels like she can be admitted at any [facility] and get the MRCP later today.  I will discuss with the hospitalist.  01:45 AM Dr Thomes Dinning, hospitalist will admit  I discussed test results with patient and she understands the need to be admitted.  CT ABDOMEN PELVIS W CONTRAST  Result Date: 10/22/2020 CLINICAL DATA:  Abdominal pain. Suspected biliary obstruction. Right-sided abdominal pain with nausea and vomiting. EXAM: CT ABDOMEN AND PELVIS WITH CONTRAST TECHNIQUE: Multidetector CT imaging of the abdomen and pelvis was performed using the standard protocol following bolus administration of intravenous contrast. CONTRAST:  OMNIPAQUE IOHEXOL 300 MG/ML  SOLN COMPARISON:  CT chest 06/10/2018.  CT abdomen and pelvis 06/03/2012 FINDINGS: Lower chest: The lung bases are clear. Hepatobiliary: Mild to moderate intra and extrahepatic bile duct dilatation. No obstructing stone or mass is identified. Suggest MRCP to exclude an occult lesion. Suggestion of vague attenuation change in the gallbladder neck probably representing gallstones. No gallbladder wall thickening or infiltration. No focal liver lesions. Pancreas: Unremarkable. No pancreatic ductal dilatation or surrounding inflammatory changes. Spleen: Normal in size without focal abnormality. Adrenals/Urinary Tract: Adrenal glands are unremarkable. Kidneys are normal, without renal calculi, focal lesion, or hydronephrosis. Bladder is unremarkable. Stomach/Bowel: Stomach is within normal limits. Appendix appears normal. No evidence of bowel wall thickening,  distention, or inflammatory changes. Vascular/Lymphatic: No significant vascular findings are present. No enlarged abdominal or pelvic lymph nodes. Reproductive: Uterus and bilateral adnexa are unremarkable. Other: No free air or free fluid in the abdomen. Several periumbilical hernias containing fat. Musculoskeletal: No acute or significant osseous findings. IMPRESSION: 1. Mild to moderate intra and extrahepatic bile duct dilatation. No obstructing stone or mass is identified. Probable cholelithiasis. Differential diagnosis may include non radiopaque common duct stones, occult mass, or inflammatory process/stricture. Suggest MRCP to exclude an occult lesion. 2. No evidence of bowel obstruction or inflammation. Normal appendix. 3. Several periumbilical hernias containing fat. Electronically Signed   By: Burman Nieves M.D.   On: 10/22/2020 00:11   Diagnoses that have been ruled out:  None  Diagnoses that are still under consideration:  None  Final diagnoses:  Calculus of bile duct without cholecystitis with obstruction   Plan admission  Devoria Albe, MD, Concha Pyo, MD 10/22/20 [phone]

## 2020-10-22 NOTE — Progress Notes (Signed)
[facility]  Surgery  Completed. Ok to have diet as tolerated. PRN for pain. Added roxicodone. Talked to Grandma, mother and significant other to notify surgery completed.  Can d/c later today versus tomorrow pending pain control and tolerating diet. Will do post op phone call 12/9.     Algis Greenhouse, MD [facility] [name] [place], [place] [address] [phone] (office)

## 2020-10-23 DIAGNOSIS — Z6841 Body Mass Index (BMI) 40.0 and over, adult: Secondary | ICD-10-CM | POA: Diagnosis not present

## 2020-10-23 DIAGNOSIS — J189 Pneumonia, unspecified organism: Secondary | ICD-10-CM | POA: Diagnosis not present

## 2020-10-23 DIAGNOSIS — K8000 Calculus of gallbladder with acute cholecystitis without obstruction: Secondary | ICD-10-CM | POA: Diagnosis not present

## 2020-10-23 DIAGNOSIS — R0789 Other chest pain: Secondary | ICD-10-CM | POA: Diagnosis not present

## 2020-10-23 DIAGNOSIS — A419 Sepsis, unspecified organism: Secondary | ICD-10-CM | POA: Diagnosis not present

## 2020-10-23 DIAGNOSIS — R7401 Elevation of levels of liver transaminase levels: Secondary | ICD-10-CM | POA: Diagnosis not present

## 2020-10-23 DIAGNOSIS — K839 Disease of biliary tract, unspecified: Secondary | ICD-10-CM | POA: Diagnosis not present

## 2020-10-23 LAB — COMPREHENSIVE METABOLIC PANEL
ALT: 372 U/L — ABNORMAL HIGH (ref 0–44)
AST: 204 U/L — ABNORMAL HIGH (ref 15–41)
Albumin: 3.3 g/dL — ABNORMAL LOW (ref 3.5–5.0)
Alkaline Phosphatase: 116 U/L (ref 38–126)
Anion gap: 8 (ref 5–15)
BUN: 8 mg/dL (ref 6–20)
CO2: 23 mmol/L (ref 22–32)
Calcium: 8.3 mg/dL — ABNORMAL LOW (ref 8.9–10.3)
Chloride: 103 mmol/L (ref 98–111)
Creatinine, Ser: 0.79 mg/dL (ref 0.44–1.00)
GFR, Estimated: >60 mL/min (ref >60)
Glucose, Bld: 118 mg/dL — ABNORMAL HIGH (ref 70–99)
Potassium: 3.6 mmol/L (ref 3.5–5.1)
Sodium: 134 mmol/L — ABNORMAL LOW (ref 135–145)
Total Bilirubin: 3.5 mg/dL — ABNORMAL HIGH (ref 0.3–1.2)
Total Protein: 6.7 g/dL (ref 6.5–8.1)

## 2020-10-23 MED ORDER — OXYCODONE HCL 5 MG PO TABS: 5.0000 mg | ORAL_TABLET | ORAL | 0 refills | Status: DC | PRN

## 2020-10-23 NOTE — Discharge Summary (Signed)
Physician Discharge Summary  Patricia [name] ZOX:[id number] DOB: 04-Dec-1990 DOA: 10/21/2020  PCP: Sandria Manly [place] date: 81/19/1478 Discharge date: 10/23/2020  Admitted From: Home Disposition:  Home   Recommendations for Outpatient Follow-up:  1. Follow up with PCP in 1-2 weeks 2. Please obtain BMP/CBC in one week     Discharge Condition: Stable CODE STATUS: FULL Diet recommendation: soft diet   Brief/Interim Summary: 29 year old female with a history of anxiety/depression/obesity presents now with acute onset of right upper quadrant and epigastric abdominal pain.  The patient work-up in the early morning of 10/21/2020 around 5 AM with chest pain.  EMS was activated.  She felt that the chest pain may have been anxiety related and subsequently it subsided.  After evaluation, EMS left the house.  Later in the morning around 11 AM, the patient developed right upper quadrant and epigastric abdominal pain that was stabbing in nature and constant.  She also had some associated nausea and vomiting x2 episodes after drinking and eating.  She denies any fevers, chills, shortness of breath, hemoptysis, hematemesis, diarrhea, hematochezia, melena.  She denies any new medications.  She reactivated EMS, the patient was brought to the hospital for further evaluation. In the emergency department, the patient was afebrile hemodynamically stable with oxygen saturation 99% room air.  BMP was unremarkable.  AST 305, ALT 367, alk phosphatase 110, total bilirubin 3.9, lipase 27.  WBC 4.9, hemoglobin 11.7, platelets 227,000.  EKG shows sinus rhythm with nonspecific T wave inversions.  CT abdomen with pelvis showed mild to moderate intrahepatic and extrahepatic biliary ductal dilatation.  Right upper quadrant ultrasound showed cholelithiasis with gallbladder wall thickening and biliary ductal dilatation.  GI and general surgery were consulted to assist with management.   Discharge  Diagnoses:  Cholecystitis -Concern for choledocholithiasis initially -11/26 RUQ US--Gallstones, gallbladder wall thickening, biliary ductal dilatation (as seen by CT) and positive sonographic Murphy sign -Started IV Zosyn>>d/c after cholecystectomy -Follow-up MRCP--no choledocholithiasis -GI consult appreciated -General surgery consult-->took patient to OR for lap chole on 10/22/20 -diet advanced which patient tolerated -Judicious pain control -Continue IV fluids  Atypical chest pain -Resolved -Personally reviewed EKG--sinus rhythm, nonspecific T wave change -Personally reviewed chest x-ray--no infiltrates or edema -troponins neg  Morbid obesity -BMI 53.72 -Lifestyle modification  Depression/anxiety -Restart sertraline 50 mg daily    Discharge Instructions   Allergies as of 10/23/2020   No Known Allergies     Medication List    STOP taking these medications   cetirizine 10 MG tablet Commonly known as: ZyrTEC Allergy   escitalopram 10 MG tablet Commonly known as: Lexapro   hydrOXYzine 10 MG tablet Commonly known as: ATARAX/VISTARIL   predniSONE 10 MG tablet Commonly known as: DELTASONE     TAKE these medications   acyclovir 400 MG tablet Commonly known as: ZOVIRAX Take 1 tablet (400 mg total) by mouth 3 (three) times daily.   CitraNatal Assure 35-1 & 300 MG tablet One tablet and one capsule daily What changed:   how much to take  how to take this  when to take this  additional instructions   fluticasone 50 MCG/ACT nasal spray Commonly known as: FLONASE Place 1 spray into both nostrils daily for 14 days.   oxyCODONE 5 MG immediate release tablet Commonly known as: Oxy IR/ROXICODONE Take 1 tablet (5 mg total) by mouth every 4 (four) hours as needed for moderate pain.   phentermine 37.5 MG tablet Commonly known as: ADIPEX-P Take 18.75 mg by mouth 2 (  two) times daily.   sertraline 50 MG tablet Commonly known as: ZOLOFT Take 50 mg by  mouth daily.       Follow-up Information    Virl Cagey, MD On 11/04/2020.   Specialty: General Surgery Why: post op phone call; if you need to be seen in person call the office  Contact information: [address] [place] [facility] [address] [phone]              No Known Allergies  Consultations:  GI  General surgery   Procedures/Studies: DG Chest 2 View  Result Date: 10/21/2020 CLINICAL DATA:  Chest and abdominal pain EXAM: CHEST - 2 VIEW COMPARISON:  06/10/2018 FINDINGS: The heart size and mediastinal contours are within normal limits. Both lungs are clear. The visualized skeletal structures are unremarkable. IMPRESSION: No active cardiopulmonary disease. Electronically Signed   By: Donavan Foil M.D.   On: 10/21/2020 21:12   CT ABDOMEN PELVIS W CONTRAST  Result Date: 10/22/2020 CLINICAL DATA:  Abdominal pain. Suspected biliary obstruction. Right-sided abdominal pain with nausea and vomiting. EXAM: CT ABDOMEN AND PELVIS WITH CONTRAST TECHNIQUE: Multidetector CT imaging of the abdomen and pelvis was performed using the standard protocol following bolus administration of intravenous contrast. CONTRAST:  167m OMNIPAQUE IOHEXOL 300 MG/ML  SOLN COMPARISON:  CT chest 06/10/2018.  CT abdomen and pelvis 06/03/2012 FINDINGS: Lower chest: The lung bases are clear. Hepatobiliary: Mild to moderate intra and extrahepatic bile duct dilatation. No obstructing stone or mass is identified. Suggest MRCP to exclude an occult lesion. Suggestion of vague attenuation change in the gallbladder neck probably representing gallstones. No gallbladder wall thickening or infiltration. No focal liver lesions. Pancreas: Unremarkable. No pancreatic ductal dilatation or surrounding inflammatory changes. Spleen: Normal in size without focal abnormality. Adrenals/Urinary Tract: Adrenal glands are unremarkable. Kidneys are normal, without renal calculi, focal lesion, or hydronephrosis. Bladder is  unremarkable. Stomach/Bowel: Stomach is within normal limits. Appendix appears normal. No evidence of bowel wall thickening, distention, or inflammatory changes. Vascular/Lymphatic: No significant vascular findings are present. No enlarged abdominal or pelvic lymph nodes. Reproductive: Uterus and bilateral adnexa are unremarkable. Other: No free air or free fluid in the abdomen. Several periumbilical hernias containing fat. Musculoskeletal: No acute or significant osseous findings. IMPRESSION: 1. Mild to moderate intra and extrahepatic bile duct dilatation. No obstructing stone or mass is identified. Probable cholelithiasis. Differential diagnosis may include non radiopaque common duct stones, occult mass, or inflammatory process/stricture. Suggest MRCP to exclude an occult lesion. 2. No evidence of bowel obstruction or inflammation. Normal appendix. 3. Several periumbilical hernias containing fat. Electronically Signed   By: WLucienne CapersM.D.   On: 10/22/2020 00:11   MR 3D Recon At Scanner  Result Date: 10/22/2020 CLINICAL DATA:  Abdominal pain.  Evaluate for biliary obstruction. EXAM: MRI ABDOMEN WITHOUT AND WITH CONTRAST (INCLUDING MRCP) TECHNIQUE: Multiplanar multisequence MR imaging of the abdomen was performed both before and after the administration of intravenous contrast. Heavily T2-weighted images of the biliary and pancreatic ducts were obtained, and three-dimensional MRCP images were rendered by post processing. CONTRAST:  169mGADAVIST GADOBUTROL 1 MMOL/ML IV SOLN COMPARISON:  Ultrasound 10/22/2020 FINDINGS: Lower chest: No acute findings. Hepatobiliary: Mild diffuse hepatic steatosis. Within the limitations of unenhanced technique there is no focal liver abnormality. The gallbladder wall appears mildly thickened measuring up to 4 mm. Stone within the proximal gallbladder measures 1.5 cm. No biliary ductal dilatation. The common bile duct measures 5 mm in diameter. No signs of  choledocholithiasis. Pancreas: No mass,  inflammatory changes, or other parenchymal abnormality identified. Spleen:  Within normal limits in size and appearance. Adrenals/Urinary Tract: Normal appearance of the adrenal glands. The kidneys are unremarkable. Stomach/Bowel: Visualized portions within the abdomen are unremarkable. Vascular/Lymphatic: No pathologically enlarged lymph nodes identified. No abdominal aortic aneurysm demonstrated. Other:  No free fluid or fluid collections. Musculoskeletal: No suspicious bone lesions identified. IMPRESSION: 1. Gallstones with gallbladder wall thickening. Findings consistent with acute cholecystitis. 2. No biliary ductal dilatation or signs of choledocholithiasis. 3. Mild diffuse hepatic steatosis. Electronically Signed   By: Kerby Moors M.D.   On: 10/22/2020 10:06   MR ABDOMEN MRCP W WO CONTAST  Result Date: 10/22/2020 CLINICAL DATA:  Abdominal pain.  Evaluate for biliary obstruction. EXAM: MRI ABDOMEN WITHOUT AND WITH CONTRAST (INCLUDING MRCP) TECHNIQUE: Multiplanar multisequence MR imaging of the abdomen was performed both before and after the administration of intravenous contrast. Heavily T2-weighted images of the biliary and pancreatic ducts were obtained, and three-dimensional MRCP images were rendered by post processing. CONTRAST:  6m GADAVIST GADOBUTROL 1 MMOL/ML IV SOLN COMPARISON:  Ultrasound 10/22/2020 FINDINGS: Lower chest: No acute findings. Hepatobiliary: Mild diffuse hepatic steatosis. Within the limitations of unenhanced technique there is no focal liver abnormality. The gallbladder wall appears mildly thickened measuring up to 4 mm. Stone within the proximal gallbladder measures 1.5 cm. No biliary ductal dilatation. The common bile duct measures 5 mm in diameter. No signs of choledocholithiasis. Pancreas: No mass, inflammatory changes, or other parenchymal abnormality identified. Spleen:  Within normal limits in size and appearance. Adrenals/Urinary  Tract: Normal appearance of the adrenal glands. The kidneys are unremarkable. Stomach/Bowel: Visualized portions within the abdomen are unremarkable. Vascular/Lymphatic: No pathologically enlarged lymph nodes identified. No abdominal aortic aneurysm demonstrated. Other:  No free fluid or fluid collections. Musculoskeletal: No suspicious bone lesions identified. IMPRESSION: 1. Gallstones with gallbladder wall thickening. Findings consistent with acute cholecystitis. 2. No biliary ductal dilatation or signs of choledocholithiasis. 3. Mild diffuse hepatic steatosis. Electronically Signed   By: TKerby MoorsM.D.   On: 10/22/2020 10:06   UKoreaAbdomen Limited RUQ (LIVER/GB)  Result Date: 10/22/2020 CLINICAL DATA:  29year old female with right upper quadrant pain. Nausea vomiting today. Abnormal LFTs. EXAM: ULTRASOUND ABDOMEN LIMITED RIGHT UPPER QUADRANT COMPARISON:  CT Abdomen and Pelvis 2346 hours yesterday. FINDINGS: Gallbladder: Shadowing gallstones (image 4) individually estimated at up to 15 mm diameter. Partially contracted gallbladder, with wall thickening up to 5 mm. No pericholecystic fluid, but positive sonographic Murphy sign elicited. Common bile duct: Diameter: 7 mm, mildly dilated. No filling defect identified within the visible duct (images 17 through 19). Liver: Liver echogenicity within normal limits. Central intrahepatic biliary ductal dilatation better demonstrated by CT. No discrete liver lesion. Portal vein is patent on color Doppler imaging with normal direction of blood flow towards the liver. Other: Negative visible right kidney. IMPRESSION: 1. Gallstones, gallbladder wall thickening, biliary ductal dilatation (as seen by CT) and positive sonographic Murphy sign. 2. This constellation is suspicious for Choledocholithiasis with possible superimposed Acute Cholecystitis. Electronically Signed   By: HGenevie AnnM.D.   On: 10/22/2020 08:24         Discharge Exam: Vitals:   10/23/20 0037  10/23/20 0455  BP: (!) 103/54 (!) 106/58  Pulse: 78 71  Resp: 18 17  Temp: 98.3 F (36.8 C) 98.4 F (36.9 C)  SpO2: 95% 95%   Vitals:   10/22/20 1828 10/22/20 2045 10/23/20 0037 10/23/20 0455  BP: (!) 117/53 115/65 (!) 103/54 (!) 106/58  Pulse: (Marland Kitchen  59 (!) 57 78 71  Resp:  _0 Temp: 97.9 F (36.6 C) 98 F (36.7 C) 98.3 F (36.8 C) 98.4 F (36.9 C)  TempSrc: Oral Oral    SpO2:  97% 95% 95%  Weight:      Height:        General: Pt is alert, awake, not in acute distress Cardiovascular: RRR, S1/S2 +, no rubs, no gallops Respiratory: CTA bilaterally, no wheezing, no rhonchi Abdominal: Soft, NT, ND, bowel sounds + Extremities: no edema, no cyanosis   The results of significant diagnostics from this hospitalization (including imaging, microbiology, ancillary and laboratory) are listed below for reference.    Significant Diagnostic Studies: DG Chest 2 View  Result Date: 10/21/2020 CLINICAL DATA:  Chest and abdominal pain EXAM: CHEST - 2 VIEW COMPARISON:  06/10/2018 FINDINGS: The heart size and mediastinal contours are within normal limits. Both lungs are clear. The visualized skeletal structures are unremarkable. IMPRESSION: No active cardiopulmonary disease. Electronically Signed   By: Donavan Foil M.D.   On: 10/21/2020 21:12   CT ABDOMEN PELVIS W CONTRAST  Result Date: 10/22/2020 CLINICAL DATA:  Abdominal pain. Suspected biliary obstruction. Right-sided abdominal pain with nausea and vomiting. EXAM: CT ABDOMEN AND PELVIS WITH CONTRAST TECHNIQUE: Multidetector CT imaging of the abdomen and pelvis was performed using the standard protocol following bolus administration of intravenous contrast. CONTRAST:  124m OMNIPAQUE IOHEXOL 300 MG/ML  SOLN COMPARISON:  CT chest 06/10/2018.  CT abdomen and pelvis 06/03/2012 FINDINGS: Lower chest: The lung bases are clear. Hepatobiliary: Mild to moderate intra and extrahepatic bile duct dilatation. No obstructing stone or mass is  identified. Suggest MRCP to exclude an occult lesion. Suggestion of vague attenuation change in the gallbladder neck probably representing gallstones. No gallbladder wall thickening or infiltration. No focal liver lesions. Pancreas: Unremarkable. No pancreatic ductal dilatation or surrounding inflammatory changes. Spleen: Normal in size without focal abnormality. Adrenals/Urinary Tract: Adrenal glands are unremarkable. Kidneys are normal, without renal calculi, focal lesion, or hydronephrosis. Bladder is unremarkable. Stomach/Bowel: Stomach is within normal limits. Appendix appears normal. No evidence of bowel wall thickening, distention, or inflammatory changes. Vascular/Lymphatic: No significant vascular findings are present. No enlarged abdominal or pelvic lymph nodes. Reproductive: Uterus and bilateral adnexa are unremarkable. Other: No free air or free fluid in the abdomen. Several periumbilical hernias containing fat. Musculoskeletal: No acute or significant osseous findings. IMPRESSION: 1. Mild to moderate intra and extrahepatic bile duct dilatation. No obstructing stone or mass is identified. Probable cholelithiasis. Differential diagnosis may include non radiopaque common duct stones, occult mass, or inflammatory process/stricture. Suggest MRCP to exclude an occult lesion. 2. No evidence of bowel obstruction or inflammation. Normal appendix. 3. Several periumbilical hernias containing fat. Electronically Signed   By: WLucienne CapersM.D.   On: 10/22/2020 00:11   MR 3D Recon At Scanner  Result Date: 10/22/2020 CLINICAL DATA:  Abdominal pain.  Evaluate for biliary obstruction. EXAM: MRI ABDOMEN WITHOUT AND WITH CONTRAST (INCLUDING MRCP) TECHNIQUE: Multiplanar multisequence MR imaging of the abdomen was performed both before and after the administration of intravenous contrast. Heavily T2-weighted images of the biliary and pancreatic ducts were obtained, and three-dimensional MRCP images were rendered by  post processing. CONTRAST:  163mGADAVIST GADOBUTROL 1 MMOL/ML IV SOLN COMPARISON:  Ultrasound 10/22/2020 FINDINGS: Lower chest: No acute findings. Hepatobiliary: Mild diffuse hepatic steatosis. Within the limitations of unenhanced technique there is no focal liver abnormality. The gallbladder wall appears mildly thickened measuring up to 4 mm. Stone within the  proximal gallbladder measures 1.5 cm. No biliary ductal dilatation. The common bile duct measures 5 mm in diameter. No signs of choledocholithiasis. Pancreas: No mass, inflammatory changes, or other parenchymal abnormality identified. Spleen:  Within normal limits in size and appearance. Adrenals/Urinary Tract: Normal appearance of the adrenal glands. The kidneys are unremarkable. Stomach/Bowel: Visualized portions within the abdomen are unremarkable. Vascular/Lymphatic: No pathologically enlarged lymph nodes identified. No abdominal aortic aneurysm demonstrated. Other:  No free fluid or fluid collections. Musculoskeletal: No suspicious bone lesions identified. IMPRESSION: 1. Gallstones with gallbladder wall thickening. Findings consistent with acute cholecystitis. 2. No biliary ductal dilatation or signs of choledocholithiasis. 3. Mild diffuse hepatic steatosis. Electronically Signed   By: Kerby Moors M.D.   On: 10/22/2020 10:06   MR ABDOMEN MRCP W WO CONTAST  Result Date: 10/22/2020 CLINICAL DATA:  Abdominal pain.  Evaluate for biliary obstruction. EXAM: MRI ABDOMEN WITHOUT AND WITH CONTRAST (INCLUDING MRCP) TECHNIQUE: Multiplanar multisequence MR imaging of the abdomen was performed both before and after the administration of intravenous contrast. Heavily T2-weighted images of the biliary and pancreatic ducts were obtained, and three-dimensional MRCP images were rendered by post processing. CONTRAST:  56m GADAVIST GADOBUTROL 1 MMOL/ML IV SOLN COMPARISON:  Ultrasound 10/22/2020 FINDINGS: Lower chest: No acute findings. Hepatobiliary: Mild diffuse  hepatic steatosis. Within the limitations of unenhanced technique there is no focal liver abnormality. The gallbladder wall appears mildly thickened measuring up to 4 mm. Stone within the proximal gallbladder measures 1.5 cm. No biliary ductal dilatation. The common bile duct measures 5 mm in diameter. No signs of choledocholithiasis. Pancreas: No mass, inflammatory changes, or other parenchymal abnormality identified. Spleen:  Within normal limits in size and appearance. Adrenals/Urinary Tract: Normal appearance of the adrenal glands. The kidneys are unremarkable. Stomach/Bowel: Visualized portions within the abdomen are unremarkable. Vascular/Lymphatic: No pathologically enlarged lymph nodes identified. No abdominal aortic aneurysm demonstrated. Other:  No free fluid or fluid collections. Musculoskeletal: No suspicious bone lesions identified. IMPRESSION: 1. Gallstones with gallbladder wall thickening. Findings consistent with acute cholecystitis. 2. No biliary ductal dilatation or signs of choledocholithiasis. 3. Mild diffuse hepatic steatosis. Electronically Signed   By: TKerby MoorsM.D.   On: 10/22/2020 10:06   UKoreaAbdomen Limited RUQ (LIVER/GB)  Result Date: 10/22/2020 CLINICAL DATA:  29year old female with right upper quadrant pain. Nausea vomiting today. Abnormal LFTs. EXAM: ULTRASOUND ABDOMEN LIMITED RIGHT UPPER QUADRANT COMPARISON:  CT Abdomen and Pelvis 2346 hours yesterday. FINDINGS: Gallbladder: Shadowing gallstones (image 4) individually estimated at up to 15 mm diameter. Partially contracted gallbladder, with wall thickening up to 5 mm. No pericholecystic fluid, but positive sonographic Murphy sign elicited. Common bile duct: Diameter: 7 mm, mildly dilated. No filling defect identified within the visible duct (images 17 through 19). Liver: Liver echogenicity within normal limits. Central intrahepatic biliary ductal dilatation better demonstrated by CT. No discrete liver lesion. Portal vein is  patent on color Doppler imaging with normal direction of blood flow towards the liver. Other: Negative visible right kidney. IMPRESSION: 1. Gallstones, gallbladder wall thickening, biliary ductal dilatation (as seen by CT) and positive sonographic Murphy sign. 2. This constellation is suspicious for Choledocholithiasis with possible superimposed Acute Cholecystitis. Electronically Signed   By: HGenevie AnnM.D.   On: 10/22/2020 08:24     Microbiology: Recent Results (from the past 240 hour(s))  Resp Panel by RT-PCR (Flu A&B, Covid) Nasopharyngeal Swab     Status: None   Collection Time: 10/22/20  1:20 AM   Specimen: Nasopharyngeal Swab; Nasopharyngeal(NP) swabs in  vial transport medium  Result Value Ref Range Status   SARS Coronavirus 2 by RT PCR NEGATIVE NEGATIVE Final    Comment: (NOTE) SARS-CoV-2 target nucleic acids are NOT DETECTED.  The SARS-CoV-2 RNA is generally detectable in upper respiratory specimens during the acute phase of infection. The lowest concentration of SARS-CoV-2 viral copies this assay can detect is 138 copies/mL. A negative result does not preclude SARS-Cov-2 infection and should not be used as the sole basis for treatment or other patient management decisions. A negative result may occur with  improper specimen collection/handling, submission of specimen other than nasopharyngeal swab, presence of viral mutation(s) within the areas targeted by this assay, and inadequate number of viral copies(<138 copies/mL). A negative result must be combined with clinical observations, patient history, and epidemiological information. The expected result is Negative.  Fact Sheet for Patients:  EntrepreneurPulse.com.au  Fact Sheet for Healthcare Providers:  IncredibleEmployment.be  This test is no t yet approved or cleared by the [place] FDA and  has been authorized for detection and/or diagnosis of SARS-CoV-2 by FDA under an Emergency  Use Authorization (EUA). This EUA will remain  in effect (meaning this test can be used) for the duration of the COVID-19 declaration under Section 564(b)(1) of the Act, 21 U.S.C.section 360bbb-3(b)(1), unless the authorization is terminated  or revoked sooner.       Influenza A by PCR NEGATIVE NEGATIVE Final   Influenza B by PCR NEGATIVE NEGATIVE Final    Comment: (NOTE) The Xpert Xpress SARS-CoV-2/FLU/RSV plus assay is intended as an aid in the diagnosis of influenza from Nasopharyngeal swab specimens and should not be used as a sole basis for treatment. Nasal washings and aspirates are unacceptable for Xpert Xpress SARS-CoV-2/FLU/RSV testing.  Fact Sheet for Patients: EntrepreneurPulse.com.au  Fact Sheet for Healthcare Providers: IncredibleEmployment.be  This test is not yet approved or cleared by the [place] FDA and has been authorized for detection and/or diagnosis of SARS-CoV-2 by FDA under an Emergency Use Authorization (EUA). This EUA will remain in effect (meaning this test can be used) for the duration of the COVID-19 declaration under Section 564(b)(1) of the Act, 21 U.S.C. section 360bbb-3(b)(1), unless the authorization is terminated or revoked.  Performed at [facility], [address], [place], [place] [address]      Labs: Basic Metabolic Panel: Recent Labs  Lab 10/21/20 2150 10/22/20 0353  NA 135 136  K 3.6 3.6  CL 105 105  CO2 24 24  GLUCOSE 107* 99  BUN 8 7  CREATININE 0.89 0.82  CALCIUM 8.7* 8.6*  MG  --  2.1  PHOS  --  3.7   Liver Function Tests: Recent Labs  Lab 10/21/20 2150 10/22/20 0353  AST 330* 305*  ALT 339* 367*  ALKPHOS 101 110  BILITOT 3.1* 3.9*  PROT 7.3 7.1  ALBUMIN 3.8 3.6   Recent Labs  Lab 10/21/20 2150  LIPASE 27   No results for input(s): AMMONIA in the last 168 hours. CBC: Recent Labs  Lab 10/21/20 2150 10/22/20 0353 10/22/20 0936  WBC 4.9 4.4 3.8*  HGB 11.7*  11.7* 11.6*  HCT 36.9 36.3 36.4  MCV 84.1 84.0 84.5  PLT 227 222 213   Cardiac Enzymes: No results for input(s): CKTOTAL, CKMB, CKMBINDEX, TROPONINI in the last 168 hours. BNP: Invalid input(s): POCBNP CBG: No results for input(s): GLUCAP in the last 168 hours.  Time coordinating discharge:  36 minutes  Signed:  Orson Eva, DO [facility] Pager: [phone] 10/23/2020, 10:09  AM   

## 2020-10-23 NOTE — Plan of Care (Signed)

## 2020-10-23 NOTE — Progress Notes (Signed)
[facility]  Doing better this AM. LFTs trending down. Feeling sore and having shoulder pain as expected with laparoscopic surgery.  Encouraged hydration. Tylenol and ibuprofen for pain and PRN roxicodone.  Will do post op phone call in 2 weeks, office will call patient with time.  Algis Greenhouse, MD [facility] [name] [place], [place] [address] [phone] (office)

## 2020-10-25 ENCOUNTER — Encounter (HOSPITAL_COMMUNITY): Payer: Self-pay | Admitting: General Surgery

## 2020-10-25 ENCOUNTER — Inpatient Hospital Stay (HOSPITAL_COMMUNITY)
Admission: EM | Admit: 2020-10-25 | Discharge: 2020-10-30 | DRG: 871 | Disposition: A | Discharge status: Home / Self Care | Payer: 59 | Attending: Family Medicine | Admitting: Family Medicine

## 2020-10-25 ENCOUNTER — Other Ambulatory Visit: Payer: Self-pay

## 2020-10-25 ENCOUNTER — Emergency Department (HOSPITAL_COMMUNITY): Payer: 59

## 2020-10-25 ENCOUNTER — Telehealth: Payer: Self-pay

## 2020-10-25 DIAGNOSIS — Z8042 Family history of malignant neoplasm of prostate: Secondary | ICD-10-CM

## 2020-10-25 DIAGNOSIS — R0902 Hypoxemia: Secondary | ICD-10-CM

## 2020-10-25 DIAGNOSIS — A419 Sepsis, unspecified organism: Principal | ICD-10-CM | POA: Diagnosis present

## 2020-10-25 DIAGNOSIS — K5900 Constipation, unspecified: Secondary | ICD-10-CM | POA: Diagnosis not present

## 2020-10-25 DIAGNOSIS — R109 Unspecified abdominal pain: Secondary | ICD-10-CM

## 2020-10-25 DIAGNOSIS — K859 Acute pancreatitis without necrosis or infection, unspecified: Secondary | ICD-10-CM

## 2020-10-25 DIAGNOSIS — R0689 Other abnormalities of breathing: Secondary | ICD-10-CM

## 2020-10-25 DIAGNOSIS — J189 Pneumonia, unspecified organism: Secondary | ICD-10-CM

## 2020-10-25 DIAGNOSIS — Z20822 Contact with and (suspected) exposure to covid-19: Secondary | ICD-10-CM | POA: Diagnosis present

## 2020-10-25 DIAGNOSIS — E876 Hypokalemia: Secondary | ICD-10-CM | POA: Diagnosis present

## 2020-10-25 DIAGNOSIS — K760 Fatty (change of) liver, not elsewhere classified: Secondary | ICD-10-CM | POA: Diagnosis present

## 2020-10-25 DIAGNOSIS — R0789 Other chest pain: Secondary | ICD-10-CM

## 2020-10-25 DIAGNOSIS — Z79899 Other long term (current) drug therapy: Secondary | ICD-10-CM

## 2020-10-25 DIAGNOSIS — Z833 Family history of diabetes mellitus: Secondary | ICD-10-CM

## 2020-10-25 DIAGNOSIS — K851 Biliary acute pancreatitis without necrosis or infection: Secondary | ICD-10-CM | POA: Diagnosis present

## 2020-10-25 DIAGNOSIS — Z23 Encounter for immunization: Secondary | ICD-10-CM

## 2020-10-25 DIAGNOSIS — Z8249 Family history of ischemic heart disease and other diseases of the circulatory system: Secondary | ICD-10-CM

## 2020-10-25 DIAGNOSIS — E8809 Other disorders of plasma-protein metabolism, not elsewhere classified: Secondary | ICD-10-CM | POA: Diagnosis present

## 2020-10-25 DIAGNOSIS — E871 Hypo-osmolality and hyponatremia: Secondary | ICD-10-CM | POA: Diagnosis present

## 2020-10-25 DIAGNOSIS — R17 Unspecified jaundice: Secondary | ICD-10-CM | POA: Diagnosis present

## 2020-10-25 DIAGNOSIS — R0600 Dyspnea, unspecified: Secondary | ICD-10-CM

## 2020-10-25 DIAGNOSIS — Z6841 Body Mass Index (BMI) 40.0 and over, adult: Secondary | ICD-10-CM

## 2020-10-25 DIAGNOSIS — E669 Obesity, unspecified: Secondary | ICD-10-CM | POA: Diagnosis present

## 2020-10-25 MED ORDER — MORPHINE SULFATE (PF) 4 MG/ML IV SOLN
4.0000 mg | Freq: Once | INTRAVENOUS | Status: AC
  Administered 2020-10-26: 4 mg via INTRAVENOUS
  Filled 2020-10-25: qty 1

## 2020-10-25 MED ORDER — ONDANSETRON HCL 4 MG/2ML IJ SOLN
4.0000 mg | Freq: Once | INTRAMUSCULAR | Status: AC
  Administered 2020-10-26: 4 mg via INTRAVENOUS
  Filled 2020-10-25: qty 2

## 2020-10-25 NOTE — ED Triage Notes (Signed)
Pt to er, pt states that she recently had her gallbladder removed, states that she is here for some chest pain that started last night, states that she also has some sob.  States that the pain started when she was sitting up last night.  States that she took some oxycodone without relief.

## 2020-10-25 NOTE — Telephone Encounter (Signed)
Pt returned your call.  

## 2020-10-25 NOTE — Telephone Encounter (Signed)
Transition Care Management Unsuccessful Follow-up Telephone Call  Date of discharge and from where:  10/23/2020 [name]  Attempts:  1st Attempt  Reason for unsuccessful TCM follow-up call:  Left voice message

## 2020-10-26 ENCOUNTER — Emergency Department (HOSPITAL_COMMUNITY): Payer: 59

## 2020-10-26 ENCOUNTER — Observation Stay (HOSPITAL_COMMUNITY): Payer: 59

## 2020-10-26 DIAGNOSIS — K851 Biliary acute pancreatitis without necrosis or infection: Secondary | ICD-10-CM | POA: Diagnosis not present

## 2020-10-26 DIAGNOSIS — R1010 Upper abdominal pain, unspecified: Secondary | ICD-10-CM | POA: Diagnosis not present

## 2020-10-26 DIAGNOSIS — R17 Unspecified jaundice: Secondary | ICD-10-CM | POA: Diagnosis not present

## 2020-10-26 DIAGNOSIS — Z79899 Other long term (current) drug therapy: Secondary | ICD-10-CM | POA: Diagnosis not present

## 2020-10-26 DIAGNOSIS — J9600 Acute respiratory failure, unspecified whether with hypoxia or hypercapnia: Secondary | ICD-10-CM | POA: Diagnosis not present

## 2020-10-26 DIAGNOSIS — Z6841 Body Mass Index (BMI) 40.0 and over, adult: Secondary | ICD-10-CM | POA: Diagnosis not present

## 2020-10-26 DIAGNOSIS — E8809 Other disorders of plasma-protein metabolism, not elsewhere classified: Secondary | ICD-10-CM | POA: Diagnosis not present

## 2020-10-26 DIAGNOSIS — A419 Sepsis, unspecified organism: Secondary | ICD-10-CM | POA: Diagnosis not present

## 2020-10-26 DIAGNOSIS — R0789 Other chest pain: Secondary | ICD-10-CM

## 2020-10-26 DIAGNOSIS — K5900 Constipation, unspecified: Secondary | ICD-10-CM | POA: Diagnosis not present

## 2020-10-26 DIAGNOSIS — Z23 Encounter for immunization: Secondary | ICD-10-CM | POA: Diagnosis not present

## 2020-10-26 DIAGNOSIS — R6520 Severe sepsis without septic shock: Secondary | ICD-10-CM | POA: Diagnosis not present

## 2020-10-26 DIAGNOSIS — K760 Fatty (change of) liver, not elsewhere classified: Secondary | ICD-10-CM | POA: Diagnosis not present

## 2020-10-26 DIAGNOSIS — R109 Unspecified abdominal pain: Secondary | ICD-10-CM

## 2020-10-26 DIAGNOSIS — E876 Hypokalemia: Secondary | ICD-10-CM | POA: Diagnosis not present

## 2020-10-26 DIAGNOSIS — J189 Pneumonia, unspecified organism: Secondary | ICD-10-CM | POA: Diagnosis present

## 2020-10-26 DIAGNOSIS — Z8249 Family history of ischemic heart disease and other diseases of the circulatory system: Secondary | ICD-10-CM | POA: Diagnosis not present

## 2020-10-26 DIAGNOSIS — Z20822 Contact with and (suspected) exposure to covid-19: Secondary | ICD-10-CM | POA: Diagnosis not present

## 2020-10-26 DIAGNOSIS — K859 Acute pancreatitis without necrosis or infection, unspecified: Secondary | ICD-10-CM | POA: Diagnosis not present

## 2020-10-26 DIAGNOSIS — E871 Hypo-osmolality and hyponatremia: Secondary | ICD-10-CM | POA: Diagnosis not present

## 2020-10-26 DIAGNOSIS — Z833 Family history of diabetes mellitus: Secondary | ICD-10-CM | POA: Diagnosis not present

## 2020-10-26 DIAGNOSIS — Z8042 Family history of malignant neoplasm of prostate: Secondary | ICD-10-CM | POA: Diagnosis not present

## 2020-10-26 DIAGNOSIS — R0902 Hypoxemia: Secondary | ICD-10-CM | POA: Diagnosis not present

## 2020-10-26 LAB — CBC
HCT: 38.9 % (ref 36.0–46.0)
Hemoglobin: 12.4 g/dL (ref 12.0–15.0)
MCH: 26.9 pg (ref 26.0–34.0)
MCHC: 31.9 g/dL (ref 30.0–36.0)
MCV: 84.4 fL (ref 80.0–100.0)
Platelets: 280 10*3/uL (ref 150–400)
RBC: 4.61 MIL/uL (ref 3.87–5.11)
RDW: 13.7 % (ref 11.5–15.5)
WBC: 18.9 10*3/uL — ABNORMAL HIGH (ref 4.0–10.5)
nRBC: 0 % (ref 0.0–0.2)

## 2020-10-26 LAB — COMPREHENSIVE METABOLIC PANEL
ALT: 143 U/L — ABNORMAL HIGH (ref 0–44)
AST: 27 U/L (ref 15–41)
Albumin: 3.4 g/dL — ABNORMAL LOW (ref 3.5–5.0)
Alkaline Phosphatase: 124 U/L (ref 38–126)
Anion gap: 12 (ref 5–15)
BUN: 6 mg/dL (ref 6–20)
CO2: 24 mmol/L (ref 22–32)
Calcium: 8.3 mg/dL — ABNORMAL LOW (ref 8.9–10.3)
Chloride: 95 mmol/L — ABNORMAL LOW (ref 98–111)
Creatinine, Ser: 0.97 mg/dL (ref 0.44–1.00)
GFR, Estimated: >60 mL/min (ref >60)
Glucose, Bld: 104 mg/dL — ABNORMAL HIGH (ref 70–99)
Potassium: 2.9 mmol/L — ABNORMAL LOW (ref 3.5–5.1)
Sodium: 131 mmol/L — ABNORMAL LOW (ref 135–145)
Total Bilirubin: 2.7 mg/dL — ABNORMAL HIGH (ref 0.3–1.2)
Total Protein: 7.6 g/dL (ref 6.5–8.1)

## 2020-10-26 LAB — TROPONIN I (HIGH SENSITIVITY)
Troponin I (High Sensitivity): 5 ng/L (ref <18)
Troponin I (High Sensitivity): 4 ng/L (ref <18)

## 2020-10-26 LAB — LACTIC ACID, PLASMA
Lactic Acid, Venous: 1.2 mmol/L (ref 0.5–1.9)
Lactic Acid, Venous: 1.4 mmol/L (ref 0.5–1.9)

## 2020-10-26 LAB — PROCALCITONIN: Procalcitonin: 0.42 ng/mL

## 2020-10-26 LAB — CREATININE, SERUM
Creatinine, Ser: 0.79 mg/dL (ref 0.44–1.00)
GFR, Estimated: >60 mL/min (ref >60)

## 2020-10-26 LAB — RESP PANEL BY RT-PCR (FLU A&B, COVID) ARPGX2
Influenza A by PCR: NEGATIVE
Influenza B by PCR: NEGATIVE
SARS Coronavirus 2 by RT PCR: NEGATIVE

## 2020-10-26 LAB — LIPASE, BLOOD: Lipase: 86 U/L — ABNORMAL HIGH (ref 11–51)

## 2020-10-26 LAB — SURGICAL PATHOLOGY

## 2020-10-26 LAB — MAGNESIUM: Magnesium: 2.2 mg/dL (ref 1.7–2.4)

## 2020-10-26 LAB — PHOSPHORUS: Phosphorus: 2.1 mg/dL — ABNORMAL LOW (ref 2.5–4.6)

## 2020-10-26 LAB — HCG, QUANTITATIVE, PREGNANCY: hCG, Beta Chain, Quant, S: <1 m[IU]/mL (ref <5)

## 2020-10-26 MED ORDER — MORPHINE SULFATE (PF) 2 MG/ML IV SOLN
2.0000 mg | INTRAVENOUS | Status: DC | PRN
  Administered 2020-10-26 – 2020-10-30 (×19): 2 mg via INTRAVENOUS
  Filled 2020-10-26 (×19): qty 1

## 2020-10-26 MED ORDER — ENOXAPARIN SODIUM 40 MG/0.4ML ~~LOC~~ SOLN
40.0000 mg | SUBCUTANEOUS | Status: DC
  Administered 2020-10-26 – 2020-10-27 (×2): 40 mg via SUBCUTANEOUS
  Filled 2020-10-26 (×3): qty 0.4

## 2020-10-26 MED ORDER — LACTATED RINGERS IV SOLN: INTRAVENOUS | Status: DC

## 2020-10-26 MED ORDER — IOHEXOL 350 MG/ML SOLN
100.0000 mL | Freq: Once | INTRAVENOUS | Status: AC | PRN
  Administered 2020-10-26: 100 mL via INTRAVENOUS

## 2020-10-26 MED ORDER — DM-GUAIFENESIN ER 30-600 MG PO TB12
1.0000 | ORAL_TABLET | Freq: Two times a day (BID) | ORAL | Status: DC
  Administered 2020-10-26 – 2020-10-30 (×9): 1 via ORAL
  Filled 2020-10-26 (×9): qty 1

## 2020-10-26 MED ORDER — LIDOCAINE 5 % EX PTCH: 1.0000 | MEDICATED_PATCH | Freq: Once | CUTANEOUS | Status: DC

## 2020-10-26 MED ORDER — SODIUM CHLORIDE 0.9 % IV SOLN: 500.0000 mg | INTRAVENOUS | Status: DC

## 2020-10-26 MED ORDER — SODIUM CHLORIDE 0.9 % IV SOLN
500.0000 mg | Freq: Once | INTRAVENOUS | Status: AC
  Administered 2020-10-26: 500 mg via INTRAVENOUS
  Filled 2020-10-26: qty 500

## 2020-10-26 MED ORDER — SODIUM CHLORIDE 0.9 % IV SOLN: 2.0000 g | INTRAVENOUS | Status: DC

## 2020-10-26 MED ORDER — SODIUM CHLORIDE 0.9 % IV SOLN
1.0000 g | Freq: Once | INTRAVENOUS | Status: AC
  Administered 2020-10-26: 1 g via INTRAVENOUS
  Filled 2020-10-26: qty 10

## 2020-10-26 MED ORDER — ACETAMINOPHEN 325 MG PO TABS
650.0000 mg | ORAL_TABLET | Freq: Four times a day (QID) | ORAL | Status: DC | PRN
  Administered 2020-10-27 – 2020-10-29 (×3): 650 mg via ORAL
  Filled 2020-10-26 (×3): qty 2

## 2020-10-26 MED ORDER — PIPERACILLIN-TAZOBACTAM 3.375 G IVPB
3.3750 g | Freq: Three times a day (TID) | INTRAVENOUS | Status: DC
  Administered 2020-10-26 – 2020-10-30 (×12): 3.375 g via INTRAVENOUS
  Filled 2020-10-26 (×12): qty 50

## 2020-10-26 NOTE — H&P (Signed)
History and Physical  Patricia [name] XIP:[id number] DOB: Jun 12, 1991 DOA: 10/25/2020  Referring physician: Devoria Albe, MD PCP: Golden Pop, FNP  Patient coming from: Home  Chief Complaint: Chest pain  HPI: Patricia [name] is a 29 y.o. female with medical history significant for anxiety and depression who presents to the emergency department due to chest pain which started on Sunday (11/28).  Chest pain was reproducible, it was midsternal and radiates to both sides of chest.  Pain was rated 7/10 on pain scale, it was constant and worsens with cough, deep breath.  No alleviating factors was known.  She also complained of cough with occasional production of clear mucus.  Symptoms was associated with occasional weights on the forehead. Patient was recently admitted from 11/25-11/27 due to abdominal pain secondary to cholecystitis S/P cholecystectomy.  She did complain of same atypical chest pain during last admission, chest pain resolved, EKG done at that time showed sinus rhythm with nonspecific T wave changes and chest x-ray at that time showed no infiltrates or edema. She denies nausea, vomiting, diarrhea or any known sick contacts.  ED Course:  In the emergency department, she was tachycardic and tachypneic.  Work-up in the ED showed leukocytosis, hyponatremia, hypokalemia, hypoalbuminemia, ALT 143, lipase 86, T bili 2.7.  Lactic acid was negative.  Troponin was negative. CT angiography of chest with contrast showed no PE, but showed small bilateral pleural effusions with lower lobe consolidation left greater than the right.  CT abdomen and pelvis with contrast showed changes consistent with pericarditis without evidence of pancreatic necrosis noted. Chest x-ray showed low lung volumes with bilateral lower lobe and right middle lobe atelectasis, superimposed infection cannot be excluded. Patient was empirically started on IV ceftriaxone and azithromycin due to presumed CAP POA.  IV  morphine Zofran were given. Hospitalist was asked to admit patient for further evaluation and management.  [name]:  Constitutional: Negative for chills and fever.  HENT: Negative for ear pain and sore throat.   Eyes: Negative for pain and visual disturbance.  Respiratory: Positive for cough and shortness of breath.   Cardiovascular: Negative for chest pain and palpitations.  Gastrointestinal: Positive for abdominal pain (S/P surgery).  Negative for vomiting.  Endocrine: Negative for polyphagia and polyuria.  Genitourinary: Negative for decreased urine volume, dysuria, enuresis Musculoskeletal: Positive for reproducible chest pain.  Negative for arthralgias and back pain.  Skin: Negative for color change and rash.  Allergic/Immunologic: Negative for immunocompromised state.  Neurological: Negative for tremors, syncope, speech difficulty, weakness, light-headedness and headaches.  Hematological: Does not bruise/bleed easily.  All other systems reviewed and are negative   Past Medical History:  Diagnosis Date  . Anemia   . Anxiety   . Bacterial vaginosis   . Chlamydia   . Depression    PP depression after 3rd pregnancy - resolved with therarpy  . Migraine   . Preterm labor   . Preterm labor without delivery in second trimester June 2012   Positive FFN in June  . UTI (lower urinary tract infection)    Past Surgical History:  Procedure Laterality Date  . CHOLECYSTECTOMY N/A 10/22/2020   Procedure: LAPAROSCOPIC CHOLECYSTECTOMY;  Surgeon: Lucretia Roers, MD;  Location: [facility];  Service: General;  Laterality: N/A;  . NO PAST SURGERIES    . TUBAL LIGATION N/A 09/15/2019   Procedure: POST PARTUM TUBAL LIGATION;  Surgeon: Lazaro Arms, MD;  Location: [facility];  Service: Gynecology;  Laterality: N/A;  Social History:  reports that she has never smoked. She has never used smokeless tobacco. She reports previous alcohol use. She reports that she does not use  drugs.   No Known Allergies  Family History  Problem Relation Age of Onset  . Diabetes Other        great granmother  . Diabetes Paternal Grandfather   . Hypertension Paternal Grandfather   . Cancer Paternal Grandfather        prostate  . Miscarriages / Stillbirths Maternal Grandmother      Prior to Admission medications   Medication Sig Start Date End Date Taking? Authorizing Provider  acyclovir (ZOVIRAX) 400 MG tablet Take 1 tablet (400 mg total) by mouth 3 (three) times daily. 08/07/19   Cresenzo-Dishmon, Scarlette Calico, CNM  fluticasone (FLONASE) 50 MCG/ACT nasal spray Place 1 spray into both nostrils daily for 14 days. 04/02/20 04/16/20  Avegno, Zachery Dakins, FNP  oxyCODONE (OXY IR/ROXICODONE) 5 MG immediate release tablet Take 1 tablet (5 mg total) by mouth every 4 (four) hours as needed for moderate pain. 10/23/20   Catarina Hartshorn, MD  phentermine (ADIPEX-P) 37.5 MG tablet Take 18.75 mg by mouth 2 (two) times daily. 08/03/20   [provider]  Prenat w/o A-[id number] (CITRANATAL ASSURE) 35-1 & 300 MG tablet One tablet and one capsule daily Patient taking differently: Take 1 tablet by mouth daily.  02/12/19   Cheral Marker, CNM  sertraline (ZOLOFT) 50 MG tablet Take 50 mg by mouth daily. 06/14/20   [provider]    Physical Exam: BP (!) 112/96   Pulse (!) 114   Temp 99.4 F (37.4 C) (Oral)   Resp (!) 22   Ht  (1.702 m)   Wt (!) 156.9 kg   LMP 09/27/2020 Comment: tubal  SpO2 96%   BMI 54.19 kg/m   . General: 29 y.o. year-old female well developed well nourished in no acute distress.  Alert and oriented x3. Marland Kitchen HEENT: NCAT, EOMI . Neck: Supple, cranial medial . Cardiovascular: Tachycardia regular rate and rhythm with no rubs or gallops.  No thyromegaly or JVD noted.  No lower extremity edema. 2/4 pulses in all 4 extremities. Marland Kitchen Respiratory: Tachypnea.  Clear to auscultation with no wheezes or rales.  . Abdomen: Soft, diffuse tenderness to palpation,   normal bowel sounds x4 quadrants.  Surgical incision sites clean and dry. . Muskuloskeletal: No cyanosis, clubbing or edema noted bilaterally . Neuro: CN II-XII intact, strength, sensation, reflexes . Skin: No ulcerative lesions noted or rashes . Psychiatry: Judgement and insight appear normal. Mood is appropriate for condition and setting          Labs on Admission:  Basic Metabolic Panel: Recent Labs  Lab 10/21/20 2150 10/22/20 0353 10/23/20 0812 10/25/20 2353  NA 135 136 134* 131*  K 3.6 3.6 3.6 2.9*  CL 105 105 103 95*  CO2 GLUCOSE 107* 99 118* 104*  BUN CREATININE 0.89 0.82 0.79 0.97  CALCIUM 8.7* 8.6* 8.3* 8.3*  MG  --  2.1  --   --   PHOS  --  3.7  --   --    Liver Function Tests: Recent Labs  Lab 10/21/20 2150 10/22/20 0353 10/23/20 0812 10/25/20 2353  AST 330* 305* 204* 27  ALT 339* 367* 372* 143*  ALKPHOS 101 110 116 124  BILITOT 3.1* 3.9* 3.5* 2.7*  PROT 7.3 7.1 6.7 7.6  ALBUMIN 3.8 3.6 3.3* 3.4*  Recent Labs  Lab 10/21/20 2150 10/26/20 0215  LIPASE 27 86*   No results for input(s): AMMONIA in the last 168 hours. CBC: Recent Labs  Lab 10/21/20 2150 10/22/20 0353 10/22/20 0936 10/25/20 2353  WBC 4.9 4.4 3.8* 18.9*  HGB 11.7* 11.7* 11.6* 12.4  HCT 36.9 36.3 36.4 38.9  MCV 84.1 84.0 84.5 84.4  PLT 227 222 213 280   Cardiac Enzymes: No results for input(s): CKTOTAL, CKMB, CKMBINDEX, TROPONINI in the last 168 hours.  BNP (last 3 results) No results for input(s): BNP in the last 8760 hours.  ProBNP (last 3 results) No results for input(s): PROBNP in the last 8760 hours.  CBG: No results for input(s): GLUCAP in the last 168 hours.  Radiological Exams on Admission: DG Chest 2 View  Result Date: 10/25/2020 CLINICAL DATA:  Chest pain status post gallbladder surgery on Friday EXAM: CHEST - 2 VIEW COMPARISON:  Chest radiograph October 21, 2020. FINDINGS: The heart size and mediastinal contours are within normal  limits. Low lung volumes with hypoventilatory change in the dependent lungs. No visible pneumothorax or significant pleural effusion. Cholecystectomy clips. IMPRESSION: Low lung volumes with bilateral lower lobe and right middle lobe atelectasis, superimposed infection cannot be exclude. Electronically Signed   By: Maudry Mayhew MD   On: 10/25/2020 17:37   CT Angio Chest PE W/Cm &/Or Wo Cm  Result Date: 10/26/2020 CLINICAL DATA:  History of prior cholecystectomy 4 days ago with abdominal pain and distension with shortness of breath EXAM: CT ANGIOGRAPHY CHEST CT ABDOMEN AND PELVIS WITH CONTRAST TECHNIQUE: Multidetector CT imaging of the chest was performed using the standard protocol during bolus administration of intravenous contrast. Multiplanar CT image reconstructions and MIPs were obtained to evaluate the vascular anatomy. Multidetector CT imaging of the abdomen and pelvis was performed using the standard protocol during bolus administration of intravenous contrast. CONTRAST:  OMNIPAQUE IOHEXOL 350 MG/ML SOLN COMPARISON:  Chest x-ray from the previous day, CT 10/21/2020. FINDINGS: CTA CHEST FINDINGS Cardiovascular: Thoracic aorta and its branches are within normal limits. No aneurysmal dilatation or dissection is noted. No significant cardiomegaly is seen. Significant motion artifact is noted limiting evaluation of the pulmonary artery although no large central pulmonary embolus is seen. Mediastinum/Nodes: Thoracic inlet is within normal limits. No sizable hilar or mediastinal adenopathy is seen. The esophagus as visualized is within normal limits. Lungs/Pleura: Small bilateral pleural effusions are noted with bilateral lower lobe consolidation left greater than right. No sizable parenchymal nodule is seen. Musculoskeletal: No chest wall abnormality. No acute or significant osseous findings. Review of the MIP images confirms the above findings. CT ABDOMEN and PELVIS FINDINGS Hepatobiliary: Changes  consistent with recent cholecystectomy. Minimal air in fluid are noted in the gallbladder fossa which may simply be related to the prior surgery. Possibility biliary leak could not be totally excluded although no significant free fluid is noted within the abdomen. Pancreas: Considerable peripancreatic inflammatory changes are noted without evidence of pancreatic necrosis. These changes suggest acute pancreatitis. Correlation with lipase values is recommended. Spleen: Normal in size without focal abnormality. Adrenals/Urinary Tract: Adrenal glands are within normal limits. Kidneys demonstrate a normal enhancement pattern. No renal calculi or obstructive changes are seen. The bladder is well distended. Stomach/Bowel: The appendix is within normal limits. No obstructive or inflammatory changes of the colon are seen. Small bowel is within normal limits. Stomach demonstrates some mild wall thickening likely reactive to the adjacent pancreas changes. Vascular/Lymphatic: No significant vascular findings are present. No enlarged abdominal  or pelvic lymph nodes. Reproductive: Uterus and bilateral adnexa are unremarkable. Other: No abdominal wall hernia or abnormality. No abdominopelvic ascites. Postsurgical changes are noted consistent with the recent laparoscopic cholecystectomy. Small fat containing periumbilical hernia is noted. This is stable from the prior exam. Musculoskeletal: No acute or significant osseous findings. Review of the MIP images confirms the above findings. IMPRESSION: CTA of the chest: No pulmonary emboli are noted. Small bilateral pleural effusions with lower lobe consolidation left greater than right. CT of the abdomen and pelvis: Changes consistent with the prior cholecystectomy with a small amount of air in fluid in the gallbladder fossa likely related to the postoperative state. No significant intra-abdominal fluid to suggest bile leak is noted at this time. Changes consistent with acute  pancreatitis without evidence of pancreatic necrosis. Correlation with lipase levels is recommended. Electronically Signed   By: Alcide CleverMark  Lukens M.D.   On: 10/26/2020 03:29   CT Abdomen Pelvis W Contrast  Result Date: 10/26/2020 CLINICAL DATA:  History of prior cholecystectomy 4 days ago with abdominal pain and distension with shortness of breath EXAM: CT ANGIOGRAPHY CHEST CT ABDOMEN AND PELVIS WITH CONTRAST TECHNIQUE: Multidetector CT imaging of the chest was performed using the standard protocol during bolus administration of intravenous contrast. Multiplanar CT image reconstructions and MIPs were obtained to evaluate the vascular anatomy. Multidetector CT imaging of the abdomen and pelvis was performed using the standard protocol during bolus administration of intravenous contrast. CONTRAST:  100mL OMNIPAQUE IOHEXOL 350 MG/ML SOLN COMPARISON:  Chest x-ray from the previous day, CT 10/21/2020. FINDINGS: CTA CHEST FINDINGS Cardiovascular: Thoracic aorta and its branches are within normal limits. No aneurysmal dilatation or dissection is noted. No significant cardiomegaly is seen. Significant motion artifact is noted limiting evaluation of the pulmonary artery although no large central pulmonary embolus is seen. Mediastinum/Nodes: Thoracic inlet is within normal limits. No sizable hilar or mediastinal adenopathy is seen. The esophagus as visualized is within normal limits. Lungs/Pleura: Small bilateral pleural effusions are noted with bilateral lower lobe consolidation left greater than right. No sizable parenchymal nodule is seen. Musculoskeletal: No chest wall abnormality. No acute or significant osseous findings. Review of the MIP images confirms the above findings. CT ABDOMEN and PELVIS FINDINGS Hepatobiliary: Changes consistent with recent cholecystectomy. Minimal air in fluid are noted in the gallbladder fossa which may simply be related to the prior surgery. Possibility biliary leak could not be totally  excluded although no significant free fluid is noted within the abdomen. Pancreas: Considerable peripancreatic inflammatory changes are noted without evidence of pancreatic necrosis. These changes suggest acute pancreatitis. Correlation with lipase values is recommended. Spleen: Normal in size without focal abnormality. Adrenals/Urinary Tract: Adrenal glands are within normal limits. Kidneys demonstrate a normal enhancement pattern. No renal calculi or obstructive changes are seen. The bladder is well distended. Stomach/Bowel: The appendix is within normal limits. No obstructive or inflammatory changes of the colon are seen. Small bowel is within normal limits. Stomach demonstrates some mild wall thickening likely reactive to the adjacent pancreas changes. Vascular/Lymphatic: No significant vascular findings are present. No enlarged abdominal or pelvic lymph nodes. Reproductive: Uterus and bilateral adnexa are unremarkable. Other: No abdominal wall hernia or abnormality. No abdominopelvic ascites. Postsurgical changes are noted consistent with the recent laparoscopic cholecystectomy. Small fat containing periumbilical hernia is noted. This is stable from the prior exam. Musculoskeletal: No acute or significant osseous findings. Review of the MIP images confirms the above findings. IMPRESSION: CTA of the chest: No pulmonary emboli are noted.  Small bilateral pleural effusions with lower lobe consolidation left greater than right. CT of the abdomen and pelvis: Changes consistent with the prior cholecystectomy with a small amount of air in fluid in the gallbladder fossa likely related to the postoperative state. No significant intra-abdominal fluid to suggest bile leak is noted at this time. Changes consistent with acute pancreatitis without evidence of pancreatic necrosis. Correlation with lipase levels is recommended. Electronically Signed   By: Alcide Clever M.D.   On: 10/26/2020 03:29    EKG: I independently  viewed the EKG done and my findings are as followed: Sinus tachycardia at rate of 121 bpm with nonspecific ST and T wave abnormalities  Assessment/Plan Present on Admission: . Pneumonia . Obesity . Abdominal pain . Atypical chest pain . High total bilirubin  Active Problems:   Obesity   Abdominal pain   Atypical chest pain   High total bilirubin   Morbid obesity with BMI of 50.0-59.9, adult (HCC)   Pneumonia  Atypical chest pain in the setting of presumed CAP POA Chest x-ray showed low lung volumes with bilateral lower lobe and right middle lobe atelectasis, superimposed infection cannot be excluded Patient was started on IV ceftriaxone and azithromycin, we shall continue with same at this time with plan to de-escalate based on procalcitonin, blood culture, sputum culture, urine Legionella and strep pneumo Continue Mucinex, incentive spirometry, flutter valve Continue IV morphine 2 mg every 4 hours as needed for moderate/severe pain Apply lidocaine patch  Abdominal pain possibly secondary to recent abdominal surgery This is possibly related to pain from postsurgical incision site Continue IV morphine 2 mg every 4 hours as needed Continue incentive telemetry and flutter valve  Elevated lipase level CT abdomen pelvis suggestive of acute pancreatitis, however, abdominal pain not typical of pancreatitis Lipase level was only 86 Continue management as described above for abdominal pain  Elevated liver enzymes and bilirubin secondary to above ALT 143, total bili 2.7, this has improved from prior labs during last admission (11/26) Continue to monitor liver enzymes  Morbid obesity (BMI 54.19) Patient will be counseled on diet and lifestyle modification   DVT prophylaxis: Lovenox  Code Status: Full code  Family Communication: None at bedside  Disposition Plan:  Patient is from:                        home Anticipated DC to:                   SNF or family members  home Anticipated DC date:               2-3 days Anticipated DC barriers:           Patient is unstable to be discharged at this time due to presumed pneumonia POA requiring IV antibiotics at this time  Consults called: None  Admission status: Inpatient    Frankey Shown MD [facility]  10/26/2020, 6:35 AM

## 2020-10-26 NOTE — ED Notes (Signed)
ED TO INPATIENT HANDOFF REPORT  ED Nurse Name and Phone #: Babette Relic [phone]  S Name/Age/Gender Patricia [name] 29 y.o. female Room/Bed: APA05/APA05  Code Status   Code Status: Full Code  Home/SNF/Other Home Patient oriented to: self, place, time and situation Is this baseline? Yes   Triage Complete: Triage complete  Chief Complaint Pneumonia [J18.9]  Triage Note Pt to er, pt states that she recently had her gallbladder removed, states that she is here for some chest pain that started last night, states that she also has some sob.  States that the pain started when she was sitting up last night.  States that she took some oxycodone without relief.      Allergies No Known Allergies  Level of Care/Admitting Diagnosis ED Disposition    ED Disposition Condition Comment   Admit  Hospital Area: [facility] [[id number]]  Level of Care: Med-Surg [16]  Covid Evaluation: Asymptomatic Screening Protocol (No Symptoms)  Diagnosis: Pneumonia [227785]  Admitting Physician: Frankey Shown [[id number]]  Attending Physician: Narda Bonds [phone]  Estimated length of stay: past midnight tomorrow  Certification:: I certify this patient will need inpatient services for at least 2 midnights       B Medical/Surgery History Past Medical History:  Diagnosis Date  . Anemia   . Anxiety   . Bacterial vaginosis   . Chlamydia   . Depression    PP depression after 3rd pregnancy - resolved with therarpy  . Migraine   . Preterm labor   . Preterm labor without delivery in second trimester June 2012   Positive FFN in June  . UTI (lower urinary tract infection)    Past Surgical History:  Procedure Laterality Date  . CHOLECYSTECTOMY N/A 10/22/2020   Procedure: LAPAROSCOPIC CHOLECYSTECTOMY;  Surgeon: Lucretia Roers, MD;  Location: [facility] ORS;  Service: General;  Laterality: N/A;  . NO PAST SURGERIES    . TUBAL LIGATION N/A 09/15/2019   Procedure: POST PARTUM TUBAL LIGATION;   Surgeon: Lazaro Arms, MD;  Location: [facility] LD ORS;  Service: Gynecology;  Laterality: N/A;     A IV Location/Drains/Wounds Patient Lines/Drains/Airways Status    Active Line/Drains/Airways    Name Placement date Placement time Site Days   Peripheral IV 10/26/20 Right Antecubital 10/26/20  0019  Antecubital  less than 1   Incision (Closed) 10/22/20 Abdomen 10/22/20  1431   4   Incision - 4 Ports Abdomen Umbilicus;Other (Comment) Right;Lateral;Lower Right;Mid;Upper Mid;Upper 10/22/20  1425   4          Intake/Output Last 24 hours No intake or output data in the 24 hours ending 10/26/20 1443  Labs/Imaging Results for orders placed or performed during the hospital encounter of 10/25/20 (from the past 48 hour(s))  CBC     Status: Abnormal   Collection Time: 10/25/20 11:53 PM  Result Value Ref Range   WBC 18.9 (H) 4.0 - 10.5 K/uL   RBC 4.61 3.87 - 5.11 MIL/uL   Hemoglobin 12.4 12.0 - 15.0 g/dL   HCT 78.2 36 - 46 %   MCV 84.4 80.0 - 100.0 fL   MCH 26.9 26.0 - 34.0 pg   MCHC 31.9 30.0 - 36.0 g/dL   RDW 95.6 21.3 - 08.6 %   Platelets 280 150 - 400 K/uL   nRBC 0.0 0.0 - 0.2 %    Comment: Performed at [facility], [address], [place], [place] [address]  Troponin I (High Sensitivity)     Status: None  Collection Time: 10/25/20 11:53 PM  Result Value Ref Range   Troponin I (High Sensitivity) 5 <18 ng/L    Comment: (NOTE) Elevated high sensitivity troponin I (hsTnI) values and significant  changes across serial measurements may suggest ACS but many other  chronic and acute conditions are known to elevate hsTnI results.  Refer to the "Links" section for chest pain algorithms and additional  guidance. Performed at [facility], [address] [place], [place] [address]   Comprehensive metabolic panel     Status: Abnormal   Collection Time: 10/25/20 11:53 PM  Result Value Ref Range   Sodium 131 (L) 135 - 145 mmol/L   Potassium 2.9 (L) 3.5 - 5.1 mmol/L   Chloride 95 (L) 98 -  111 mmol/L   CO2 24 22 - 32 mmol/L   Glucose, Bld 104 (H) 70 - 99 mg/dL    Comment: Glucose reference range applies only to samples taken after fasting for at least 8 hours.   BUN 6 6 - 20 mg/dL   Creatinine, Ser 2.42 0.44 - 1.00 mg/dL   Calcium 8.3 (L) 8.9 - 10.3 mg/dL   Total Protein 7.6 6.5 - 8.1 g/dL   Albumin 3.4 (L) 3.5 - 5.0 g/dL   AST 27 15 - 41 U/L   ALT 143 (H) 0 - 44 U/L   Alkaline Phosphatase 124 38 - 126 U/L   Total Bilirubin 2.7 (H) 0.3 - 1.2 mg/dL   GFR, Estimated >68 >34 mL/min    Comment: (NOTE) Calculated using the CKD-EPI Creatinine Equation (2021)    Anion gap 12 5 - 15    Comment: Performed at [facility], [address] [place], [place] [address]  Lactic acid, plasma     Status: None   Collection Time: 10/25/20 11:53 PM  Result Value Ref Range   Lactic Acid, Venous 1.4 0.5 - 1.9 mmol/L    Comment: Performed at [facility], [address], [place], [place] [address]  hCG, quantitative, pregnancy     Status: None   Collection Time: 10/25/20 11:53 PM  Result Value Ref Range   hCG, Beta Chain, Quant, S <1 <5 mIU/mL    Comment:          GEST. AGE      CONC.  (mIU/mL)   <=1 WEEK        5 - 50     2 WEEKS       50 - 500     3 WEEKS       100 - 10,000     4 WEEKS     1,000 - 30,000     5 WEEKS     3,500 - 115,000   6-8 WEEKS     12,000 - 270,000    12 WEEKS     15,000 - 220,000        FEMALE AND NON-PREGNANT FEMALE:     LESS THAN 5 mIU/mL Performed at [facility], [address] [place], [place] [address]   Troponin I (High Sensitivity)     Status: None   Collection Time: 10/26/20  2:15 AM  Result Value Ref Range   Troponin I (High Sensitivity) 4 <18 ng/L    Comment: (NOTE) Elevated high sensitivity troponin I (hsTnI) values and significant  changes across serial measurements may suggest ACS but many other  chronic and acute conditions are known to elevate hsTnI results.  Refer to the "Links" section for chest pain algorithms and additional  guidance. Performed at [facility], [address], [place], [place] [address]   Lactic acid, plasma     Status: None   Collection Time: 10/26/20  2:15 AM  Result Value Ref Range   Lactic Acid, Venous 1.2 0.5 - 1.9 mmol/L    Comment: Performed at [facility], [address], [place], [place] [address]  Lipase, blood     Status: Abnormal   Collection Time: 10/26/20  2:15 AM  Result Value Ref Range   Lipase 86 (H) 11 - 51 U/L    Comment: Performed at [facility], [address], [place], [place] [address]  Resp Panel by RT-PCR (Flu A&B, Covid) Nasopharyngeal Swab     Status: None   Collection Time: 10/26/20  4:15 AM   Specimen: Nasopharyngeal Swab; Nasopharyngeal(NP) swabs in vial transport medium  Result Value Ref Range   SARS Coronavirus 2 by RT PCR NEGATIVE NEGATIVE    Comment: (NOTE) SARS-CoV-2 target nucleic acids are NOT DETECTED.  The SARS-CoV-2 RNA is generally detectable in upper respiratory specimens during the acute phase of infection. The lowest concentration of SARS-CoV-2 viral copies this assay can detect is 138 copies/mL. A negative result does not preclude SARS-Cov-2 infection and should not be used as the sole basis for treatment or other patient management decisions. A negative result may occur with  improper specimen collection/handling, submission of specimen other than nasopharyngeal swab, presence of viral mutation(s) within the areas targeted by this assay, and inadequate number of viral copies(<138 copies/mL). A negative result must be combined with clinical observations, patient history, and epidemiological information. The expected result is Negative.  Fact Sheet for Patients:  BloggerCourse.com  Fact Sheet for Healthcare Providers:  SeriousBroker.it  This test is no t yet approved or cleared by the [place] FDA and  has been authorized for detection and/or diagnosis of SARS-CoV-2 by FDA under an  Emergency Use Authorization (EUA). This EUA will remain  in effect (meaning this test can be used) for the duration of the COVID-19 declaration under Section 564(b)(1) of the Act, 21 U.S.C.section 360bbb-3(b)(1), unless the authorization is terminated  or revoked sooner.       Influenza A by PCR NEGATIVE NEGATIVE   Influenza B by PCR NEGATIVE NEGATIVE    Comment: (NOTE) The Xpert Xpress SARS-CoV-2/FLU/RSV plus assay is intended as an aid in the diagnosis of influenza from Nasopharyngeal swab specimens and should not be used as a sole basis for treatment. Nasal washings and aspirates are unacceptable for Xpert Xpress SARS-CoV-2/FLU/RSV testing.  Fact Sheet for Patients: BloggerCourse.com  Fact Sheet for Healthcare Providers: SeriousBroker.it  This test is not yet approved or cleared by the [place] FDA and has been authorized for detection and/or diagnosis of SARS-CoV-2 by FDA under an Emergency Use Authorization (EUA). This EUA will remain in effect (meaning this test can be used) for the duration of the COVID-19 declaration under Section 564(b)(1) of the Act, 21 U.S.C. section 360bbb-3(b)(1), unless the authorization is terminated or revoked.  Performed at [facility], [address], [place], [place] [address]   Culture, blood (routine x 2) Call MD if unable to obtain prior to antibiotics being given     Status: None (Preliminary result)   Collection Time: 10/26/20  6:47 AM   Specimen: BLOOD  Result Value Ref Range   Specimen Description BLOOD RIGHT ANTECUBITAL    Special Requests      BOTTLES DRAWN AEROBIC AND ANAEROBIC Blood Culture results may not be optimal due to  an inadequate volume of blood received in culture bottles   Culture      NO GROWTH < 12 HOURS Performed at [facility], [address] [place], [place] [address]    Report Status PENDING   Culture, blood (routine x 2) Call MD if unable to obtain prior  to antibiotics being given     Status: None (Preliminary result)   Collection Time: 10/26/20  6:47 AM   Specimen: BLOOD  Result Value Ref Range   Specimen Description BLOOD LEFT ANTECUBITAL    Special Requests      BOTTLES DRAWN AEROBIC AND ANAEROBIC Blood Culture adequate volume   Culture      NO GROWTH < 12 HOURS Performed at [facility], [address] [place], [place] [address]    Report Status PENDING   Creatinine, serum     Status: None   Collection Time: 10/26/20  6:47 AM  Result Value Ref Range   Creatinine, Ser 0.79 0.44 - 1.00 mg/dL   GFR, Estimated >13>60 >08>60 mL/min    Comment: (NOTE) Calculated using the CKD-EPI Creatinine Equation (2021) Performed at [facility], [address] [place], [place] [address]   Magnesium     Status: None   Collection Time: 10/26/20  6:47 AM  Result Value Ref Range   Magnesium 2.2 1.7 - 2.4 mg/dL    Comment: Performed at [facility], [address] [place], [place] [address]  Phosphorus     Status: Abnormal   Collection Time: 10/26/20  6:47 AM  Result Value Ref Range   Phosphorus 2.1 (L) 2.5 - 4.6 mg/dL    Comment: Performed at [facility], [address] [place], [place] [address]  Procalcitonin - Baseline     Status: None   Collection Time: 10/26/20  6:47 AM  Result Value Ref Range   Procalcitonin 0.42 ng/mL    Comment:        Interpretation: PCT (Procalcitonin) <= 0.5 ng/mL: Systemic infection (sepsis) is not likely. Local bacterial infection is possible. (NOTE)       Sepsis PCT Algorithm           Lower Respiratory Tract                                      Infection PCT Algorithm    ----------------------------     ----------------------------         PCT < 0.25 ng/mL                PCT < 0.10 ng/mL          Strongly encourage             Strongly discourage   discontinuation of antibiotics    initiation of antibiotics    ----------------------------     -----------------------------       PCT 0.25 - 0.50 ng/mL             PCT 0.10 - 0.25 ng/mL               OR       >80% decrease in PCT            Discourage initiation of  antibiotics      Encourage discontinuation           of antibiotics    ----------------------------     -----------------------------         PCT >= 0.50 ng/mL              PCT 0.26 - 0.50 ng/mL               AND        <80% decrease in PCT             Encourage initiation of                                             antibiotics       Encourage continuation           of antibiotics    ----------------------------     -----------------------------        PCT >= 0.50 ng/mL                  PCT > 0.50 ng/mL               AND         increase in PCT                  Strongly encourage                                      initiation of antibiotics    Strongly encourage escalation           of antibiotics                                     -----------------------------                                           PCT <= 0.25 ng/mL                                                 OR                                        > 80% decrease in PCT                                      Discontinue / Do not initiate                                             antibiotics  Performed at [facility], [address], [place], [place] [address]    DG Chest 2 View  Result Date: 10/25/2020 CLINICAL DATA:  Chest pain  status post gallbladder surgery on Friday EXAM: CHEST - 2 VIEW COMPARISON:  Chest radiograph October 21, 2020. FINDINGS: The heart size and mediastinal contours are within normal limits. Low lung volumes with hypoventilatory change in the dependent lungs. No visible pneumothorax or significant pleural effusion. Cholecystectomy clips. IMPRESSION: Low lung volumes with bilateral lower lobe and right middle lobe atelectasis, superimposed infection cannot be exclude. Electronically Signed   By: Maudry Mayhew MD   On: 10/25/2020 17:37   CT Angio Chest  PE W/Cm &/Or Wo Cm  Result Date: 10/26/2020 CLINICAL DATA:  History of prior cholecystectomy 4 days ago with abdominal pain and distension with shortness of breath EXAM: CT ANGIOGRAPHY CHEST CT ABDOMEN AND PELVIS WITH CONTRAST TECHNIQUE: Multidetector CT imaging of the chest was performed using the standard protocol during bolus administration of intravenous contrast. Multiplanar CT image reconstructions and MIPs were obtained to evaluate the vascular anatomy. Multidetector CT imaging of the abdomen and pelvis was performed using the standard protocol during bolus administration of intravenous contrast. CONTRAST:  OMNIPAQUE IOHEXOL 350 MG/ML SOLN COMPARISON:  Chest x-ray from the previous day, CT 10/21/2020. FINDINGS: CTA CHEST FINDINGS Cardiovascular: Thoracic aorta and its branches are within normal limits. No aneurysmal dilatation or dissection is noted. No significant cardiomegaly is seen. Significant motion artifact is noted limiting evaluation of the pulmonary artery although no large central pulmonary embolus is seen. Mediastinum/Nodes: Thoracic inlet is within normal limits. No sizable hilar or mediastinal adenopathy is seen. The esophagus as visualized is within normal limits. Lungs/Pleura: Small bilateral pleural effusions are noted with bilateral lower lobe consolidation left greater than right. No sizable parenchymal nodule is seen. Musculoskeletal: No chest wall abnormality. No acute or significant osseous findings. Review of the MIP images confirms the above findings. CT ABDOMEN and PELVIS FINDINGS Hepatobiliary: Changes consistent with recent cholecystectomy. Minimal air in fluid are noted in the gallbladder fossa which may simply be related to the prior surgery. Possibility biliary leak could not be totally excluded although no significant free fluid is noted within the abdomen. Pancreas: Considerable peripancreatic inflammatory changes are noted without evidence of pancreatic necrosis.  These changes suggest acute pancreatitis. Correlation with lipase values is recommended. Spleen: Normal in size without focal abnormality. Adrenals/Urinary Tract: Adrenal glands are within normal limits. Kidneys demonstrate a normal enhancement pattern. No renal calculi or obstructive changes are seen. The bladder is well distended. Stomach/Bowel: The appendix is within normal limits. No obstructive or inflammatory changes of the colon are seen. Small bowel is within normal limits. Stomach demonstrates some mild wall thickening likely reactive to the adjacent pancreas changes. Vascular/Lymphatic: No significant vascular findings are present. No enlarged abdominal or pelvic lymph nodes. Reproductive: Uterus and bilateral adnexa are unremarkable. Other: No abdominal wall hernia or abnormality. No abdominopelvic ascites. Postsurgical changes are noted consistent with the recent laparoscopic cholecystectomy. Small fat containing periumbilical hernia is noted. This is stable from the prior exam. Musculoskeletal: No acute or significant osseous findings. Review of the MIP images confirms the above findings. IMPRESSION: CTA of the chest: No pulmonary emboli are noted. Small bilateral pleural effusions with lower lobe consolidation left greater than right. CT of the abdomen and pelvis: Changes consistent with the prior cholecystectomy with a small amount of air in fluid in the gallbladder fossa likely related to the postoperative state. No significant intra-abdominal fluid to suggest bile leak is noted at this time. Changes consistent with acute pancreatitis without evidence of pancreatic necrosis. Correlation with lipase levels is recommended. Electronically Signed  By: Alcide Clever M.D.   On: 10/26/2020 03:29   CT Abdomen Pelvis W Contrast  Result Date: 10/26/2020 CLINICAL DATA:  History of prior cholecystectomy 4 days ago with abdominal pain and distension with shortness of breath EXAM: CT ANGIOGRAPHY CHEST CT  ABDOMEN AND PELVIS WITH CONTRAST TECHNIQUE: Multidetector CT imaging of the chest was performed using the standard protocol during bolus administration of intravenous contrast. Multiplanar CT image reconstructions and MIPs were obtained to evaluate the vascular anatomy. Multidetector CT imaging of the abdomen and pelvis was performed using the standard protocol during bolus administration of intravenous contrast. CONTRAST:  OMNIPAQUE IOHEXOL 350 MG/ML SOLN COMPARISON:  Chest x-ray from the previous day, CT 10/21/2020. FINDINGS: CTA CHEST FINDINGS Cardiovascular: Thoracic aorta and its branches are within normal limits. No aneurysmal dilatation or dissection is noted. No significant cardiomegaly is seen. Significant motion artifact is noted limiting evaluation of the pulmonary artery although no large central pulmonary embolus is seen. Mediastinum/Nodes: Thoracic inlet is within normal limits. No sizable hilar or mediastinal adenopathy is seen. The esophagus as visualized is within normal limits. Lungs/Pleura: Small bilateral pleural effusions are noted with bilateral lower lobe consolidation left greater than right. No sizable parenchymal nodule is seen. Musculoskeletal: No chest wall abnormality. No acute or significant osseous findings. Review of the MIP images confirms the above findings. CT ABDOMEN and PELVIS FINDINGS Hepatobiliary: Changes consistent with recent cholecystectomy. Minimal air in fluid are noted in the gallbladder fossa which may simply be related to the prior surgery. Possibility biliary leak could not be totally excluded although no significant free fluid is noted within the abdomen. Pancreas: Considerable peripancreatic inflammatory changes are noted without evidence of pancreatic necrosis. These changes suggest acute pancreatitis. Correlation with lipase values is recommended. Spleen: Normal in size without focal abnormality. Adrenals/Urinary Tract: Adrenal glands are within normal  limits. Kidneys demonstrate a normal enhancement pattern. No renal calculi or obstructive changes are seen. The bladder is well distended. Stomach/Bowel: The appendix is within normal limits. No obstructive or inflammatory changes of the colon are seen. Small bowel is within normal limits. Stomach demonstrates some mild wall thickening likely reactive to the adjacent pancreas changes. Vascular/Lymphatic: No significant vascular findings are present. No enlarged abdominal or pelvic lymph nodes. Reproductive: Uterus and bilateral adnexa are unremarkable. Other: No abdominal wall hernia or abnormality. No abdominopelvic ascites. Postsurgical changes are noted consistent with the recent laparoscopic cholecystectomy. Small fat containing periumbilical hernia is noted. This is stable from the prior exam. Musculoskeletal: No acute or significant osseous findings. Review of the MIP images confirms the above findings. IMPRESSION: CTA of the chest: No pulmonary emboli are noted. Small bilateral pleural effusions with lower lobe consolidation left greater than right. CT of the abdomen and pelvis: Changes consistent with the prior cholecystectomy with a small amount of air in fluid in the gallbladder fossa likely related to the postoperative state. No significant intra-abdominal fluid to suggest bile leak is noted at this time. Changes consistent with acute pancreatitis without evidence of pancreatic necrosis. Correlation with lipase levels is recommended. Electronically Signed   By: Alcide Clever M.D.   On: 10/26/2020 03:29   US Abdomen Limited RUQ (LIVER/GB)  Result Date: 10/26/2020 CLINICAL DATA:  Abdominal pain. Four days status post cholecystectomy. EXAM: ULTRASOUND ABDOMEN LIMITED RIGHT UPPER QUADRANT COMPARISON:  None. FINDINGS: Gallbladder: Surgically absent. A complex postop fluid collection is seen within the gallbladder fossa which measures approximately 7.9 x 3.2 cm. Common bile duct: Diameter: 7 mm, within  normal limits post cholecystectomy. Liver: No focal lesion identified. Within normal limits in parenchymal echogenicity. Portal vein is patent on color Doppler imaging with normal direction of blood flow towards the liver. Other: None. IMPRESSION: 7.9 x 3.2 cm complex postop fluid collection within the gallbladder fossa. No evidence of biliary ductal dilatation. Electronically Signed   By: Danae Orleans M.D.   On: 10/26/2020 12:51    Pending Labs Unresulted Labs (From admission, onward)          Start     Ordered   11/02/20 0500  Creatinine, serum  (enoxaparin (LOVENOX)    CrCl >/= 30 ml/min)  Weekly,   R     Comments: while on enoxaparin therapy    10/26/20 0443   10/27/20 0500  Comprehensive metabolic panel  Tomorrow morning,   R        10/26/20 0443   10/27/20 0500  CBC  Tomorrow morning,   R        10/26/20 0443   10/27/20 0500  Protime-INR  Tomorrow morning,   R        10/26/20 0443   10/27/20 0500  APTT  Tomorrow morning,   R        10/26/20 0443   10/27/20 0500  Lipase, blood  Tomorrow morning,   R        10/26/20 0700   10/26/20 0444  Culture, sputum-assessment  (COPD / Pneumonia )  Once,   STAT        10/26/20 0443   10/26/20 0444  Legionella Pneumophila Serogp 1 Ur Ag  (COPD / Pneumonia )  Once,   STAT        10/26/20 0443   10/26/20 0444  Strep pneumoniae urinary antigen  (COPD / Pneumonia )  Once,   STAT        10/26/20 0443          Vitals/Pain Today's Vitals   10/26/20 1208 10/26/20 1230 10/26/20 1300 10/26/20 1420  BP:  122/81 123/79 125/87  Pulse:  (!) 110 (!) 109 (!) 112  Resp:  (!) 21 19 (!) 22  Temp:      TempSrc:      SpO2:  98% 98% 98%  Weight:      Height:      PainSc: Asleep       Isolation Precautions Droplet precaution  Medications Medications  cefTRIAXone (ROCEPHIN) 2 g in sodium chloride 0.9 % 100 mL IVPB (has no administration in time range)  azithromycin (ZITHROMAX) 500 mg in sodium chloride 0.9 % 250 mL IVPB (has no administration in  time range)  enoxaparin (LOVENOX) injection 40 mg (40 mg Subcutaneous Given 10/26/20 1127)  morphine 2 MG/ML injection 2 mg (2 mg Intravenous Given 10/26/20 1127)  dextromethorphan-guaiFENesin (MUCINEX DM) 30-600 MG per 12 hr tablet 1 tablet (1 tablet Oral Given 10/26/20 1127)  lidocaine (LIDODERM) 5 % 1 patch (0 patches Transdermal Hold 10/26/20 1059)  lactated ringers infusion ( Intravenous New Bag/Given 10/26/20 1131)  morphine 4 MG/ML injection 4 mg (4 mg Intravenous Given 10/26/20 0021)  ondansetron (ZOFRAN) injection 4 mg (4 mg Intravenous Given 10/26/20 0020)  iohexol (OMNIPAQUE) 350 MG/ML injection 100 mL (100 mLs Intravenous Contrast Given 10/26/20 0308)  cefTRIAXone (ROCEPHIN) 1 g in sodium chloride 0.9 % 100 mL IVPB (0 g Intravenous Stopped 10/26/20 0504)  azithromycin (ZITHROMAX) 500 mg in sodium chloride 0.9 % 250 mL IVPB (0 mg Intravenous Stopped 10/26/20 0642)    Mobility walks Low  fall risk   Focused Assessments    R Recommendations: See Admitting Provider Note  Report given to:   Additional Notes:

## 2020-10-26 NOTE — Progress Notes (Signed)
Reviewed [place] ordered from this morning. Complex fluid noted which seems concerning for bile leak. Called on-call general surgery who recommends HIDA scan and transition to Zosyn IV. General surgery will see the patient in the morning and consult gastroenterology for management of likely bile leak. HIDA scan ordered. Zosyn IV ordered. Tylenol added for fever as needed.   Jacquelin Hawking, MD [facility] 10/26/2020, 6:48 PM

## 2020-10-26 NOTE — Progress Notes (Signed)
   10/26/20 1840  Assess: MEWS Score  Temp 100 F (37.8 C)  BP 124/75  Pulse Rate (!) 115  Resp 19  SpO2 97 %  O2 Device Room Air  Assess: MEWS Score  MEWS Temp 0  MEWS Systolic 0  MEWS Pulse 2  MEWS RR 0  MEWS LOC 0  MEWS Score 2  MEWS Score Color Yellow  Assess: if the MEWS score is Yellow or Red  Were vital signs taken at a resting state? Yes  MEWS guidelines implemented *See Row Information* No, previously yellow, continue vital signs every 4 hours

## 2020-10-26 NOTE — Progress Notes (Signed)
   Patient seen and examined at bedside, patient admitted after midnight, please see earlier detailed admission note by Frankey Shown, DO. Briefly, patient presented secondary to chest pain with evidence of pneumonia on imaging. Question of associated pancreatitis without significantly elevated lipase.  Subjective: Chest and abdominal pain. States she does not handle pain well.  BP (!) 127/92   Pulse (!) 112   Temp 99.4 F (37.4 C) (Oral)   Resp (!) 22   Ht 5\' 7"  (1.702 m)   Wt (!) 156.9 kg   LMP 09/27/2020 Comment: tubal  SpO2 98%   BMI 54.19 kg/m   General exam: Appears calm and uncomfortable Respiratory system: Clear to auscultation. Respiratory effort normal. Cardiovascular system: S1 & S2 heard, RRR. No murmurs, rubs, gallops or clicks. Gastrointestinal system: Abdomen is nondistended, soft and nontender. No organomegaly or masses felt. Normal bowel sounds heard. Central nervous system: Alert and oriented. No focal neurological deficits. Musculoskeletal: No edema. No calf tenderness. Reproducible chest pain over ribs and sternum Skin: No cyanosis. No rashes Psychiatry: Judgement and insight appear normal. Mood & affect appropriate.   Brief assessment/Plan:  Community acquired pneumonia Elevated WBC, chest pain, consolidation on CT chest. Started empirically on Ceftriaxone and azithromycin. Blood and sputum cultures ordered. -Continue Ceftriaxone and azithromycin -CBC in AM to trend leukocytosis -Procalcitonin pending  Chest pain Abdominal pain No PE on CTA chest. Atypical and possibly secondary to above. Patient also has abdominal pain as well. Chest pain is reproducible. Patient with recent laparoscopic cholecystectomy and evidence of acute pancreatitis on CT imaging as mentioned below -Continue morphine -RUQ ultrasound to rule out bile leak and/or ductal dilation  Abnormal pancreas CT imaging suggests acute pancreatitis. Pain and lipase not consistent with  pancreatitis. Patient with significant pain but also states she does not handle pain well. -Make NPO -Lipase in AM -Morphine prn -IV fluids  Elevated LFTs/bilirubin In setting of infection. No biliary dilation and normal ALP. Patient has a history of cholecystectomy. -CMP in AM  Obesity  Body mass index is 54.19 kg/m.  Family communication: None at bedside DVT prophylaxis: Lovenox Disposition: Anticipate discharge home in several days pending continued workup for pain/treatment of possible pneumonia with transition to oral antibiotics.  13/11/2019, MD [facility] 10/26/2020, 6:54 AM

## 2020-10-26 NOTE — ED Provider Notes (Signed)
[facility] EMERGENCY DEPARTMENT Provider Note   CSN: [id number] Arrival date & time: 10/25/20  1653   Time seen 11:45 PM  History Chief Complaint  Patient presents with  . Chest Pain    Patricia [name] is a 29 y.o. female.  HPI Patient was seen in the ED on November 25 with chest and abdominal pain and was found to have cholecystitis.  She was admitted and had laparoscopic cholecystectomy done by Dr. Henreitta Leber on November 26.  She reports she was discharged home from the hospital on the 27th.  She states she had chest pain prior to be admitted and it has continued and has not gotten better.  She also reports last night/early this morning she started getting short of breath.  She states she started coughing yesterday with some clear mucus.  She is unaware fever but states she is having hot flashes and cold chills.  She has nausea but no vomiting or diarrhea.  She has mild sore throat without rhinorrhea.  She describes her chest pain is being in the center of her chest and sharp and constant.  Deep breathing and coughing make it worse.  PCP Golden Pop, FNP Surgery Dr Henreitta Leber    Past Medical History:  Diagnosis Date  . Anemia   . Anxiety   . Bacterial vaginosis   . Chlamydia   . Depression    PP depression after 3rd pregnancy - resolved with therarpy  . Migraine   . Preterm labor   . Preterm labor without delivery in second trimester June 2012   Positive FFN in June  . UTI (lower urinary tract infection)     Patient Active Problem List   Diagnosis Date Noted  . Pneumonia 10/26/2020  . Abdominal pain 10/22/2020  . Nausea & vomiting 10/22/2020  . Atypical chest pain 10/22/2020  . History of anxiety 10/22/2020  . Biliary colic 10/22/2020  . Elevated liver enzymes 10/22/2020  . High total bilirubin 10/22/2020  . Morbid obesity with BMI of 50.0-59.9, adult (HCC) 10/22/2020  . Bile duct abnormality   . 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  Transaminasemia   . Calculus of gallbladder with acute cholecystitis without obstruction   . History of preterm delivery 11/08/2015  . 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 History of gestational diabetes 11/08/2015  . 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 Obesity 04/01/2013    Past Surgical History:  Procedure Laterality Date  . CHOLECYSTECTOMY N/A 10/22/2020   Procedure: LAPAROSCOPIC CHOLECYSTECTOMY;  Surgeon: Lucretia Roers, MD;  Location: [facility] ORS;  Service: General;  Laterality: N/A;  . NO PAST SURGERIES    . TUBAL LIGATION N/A 09/15/2019   Procedure: POST PARTUM TUBAL LIGATION;  Surgeon: Lazaro Arms, MD;  Location: [facility] LD ORS;  Service: Gynecology;  Laterality: N/A;     OB History    Gravida  5   Para  4   Term  3   Preterm  1   AB  1   Living  4     SAB  1   TAB      Ectopic      Multiple  0   Live Births  4           Family History  Problem Relation Age of Onset  . Diabetes Other        great granmother  . Diabetes Paternal Grandfather   . Hypertension Paternal Grandfather   . Cancer Paternal Grandfather  prostate  . Miscarriages / Stillbirths Maternal Grandmother     Social History   Tobacco Use  . Smoking status: Never Smoker  . Smokeless tobacco: Never Used  Vaping Use  . Vaping Use: Never used  Substance Use Topics  . Alcohol use: Not Currently  . Drug use: No    Home Medications Prior to Admission medications   Medication Sig Start Date End Date Taking? Authorizing Provider  acyclovir (ZOVIRAX) 400 MG tablet Take 1 tablet (400 mg total) by mouth 3 (three) times daily. 08/07/19   Cresenzo-Dishmon, Scarlette CalicoFrances, CNM  fluticasone (FLONASE) 50 MCG/ACT nasal spray Place 1 spray into both nostrils daily for 14 days. 04/02/20 04/16/20  Avegno, Zachery DakinsKomlanvi S, FNP  oxyCODONE (OXY IR/ROXICODONE) 5 MG immediate release tablet Take 1 tablet (5 mg total) by mouth every 4 (four) hours as needed for moderate pain. 10/23/20   Catarina Hartshornat, David, MD  phentermine  (ADIPEX-P) 37.5 MG tablet Take 18.75 mg by mouth 2 (two) times daily. 08/03/20   [provider]  Prenat w/o A-[id number] (CITRANATAL ASSURE) 35-1 & 300 MG tablet One tablet and one capsule daily Patient taking differently: Take 1 tablet by mouth daily.  02/12/19   Cheral MarkerBooker, Kimberly R, CNM  sertraline (ZOLOFT) 50 MG tablet Take 50 mg by mouth daily. 06/14/20   [provider]    Allergies    Patient has no known allergies.  [name]   [name]  All other systems reviewed and are negative.   Physical Exam Updated Vital Signs BP 125/80   Pulse (!) 112   Temp 99.4 F (37.4 C) (Oral)   Resp (!) 23   Ht 5\' 7"  (1.702 m)   Wt (!) 156.9 kg   LMP 09/27/2020 Comment: tubal  SpO2 95%   BMI 54.19 kg/m   Physical Exam Constitutional:      General: She is in acute distress.     Appearance: She is obese.  HENT:     Head: Normocephalic.     Nose: Nose normal.  Eyes:     Extraocular Movements: Extraocular movements intact.     Conjunctiva/sclera: Conjunctivae normal.     Pupils: Pupils are equal, round, and reactive to light.  Cardiovascular:     Rate and Rhythm: Regular rhythm. Tachycardia present.     Pulses: Normal pulses.     Heart sounds: Normal heart sounds. No murmur heard.   Pulmonary:     Effort: Tachypnea present.     Breath sounds: Decreased breath sounds present. No wheezing, rhonchi or rales.     Comments: Patient appears tachypneic and gets short of breath with talking Abdominal:     General: There is distension.     Palpations: Abdomen is soft.     Tenderness: There is abdominal tenderness.     Comments: Patient has diffuse abdominal tenderness even to light touch.  Her surgical incision sites look good without infection.  Musculoskeletal:        General: Normal range of motion.     Cervical back: Normal range of motion.  Skin:    General: Skin is warm and dry.  Neurological:     General: No focal deficit present.      Mental Status: She is alert and oriented to person, place, and time.     Cranial Nerves: No cranial nerve deficit.  Psychiatric:        Mood and Affect: Mood normal.        Behavior: Behavior normal.  Thought Content: Thought content normal.     ED Results / Procedures / Treatments   Labs (all labs ordered are listed, but only abnormal results are displayed) Results for orders placed or performed during the hospital encounter of 10/25/20  CBC  Result Value Ref Range   WBC 18.9 (H) 4.0 - 10.5 K/uL   RBC 4.61 3.87 - 5.11 MIL/uL   Hemoglobin 12.4 12.0 - 15.0 g/dL   HCT 99.8 36 - 46 %   MCV 84.4 80.0 - 100.0 fL   MCH 26.9 26.0 - 34.0 pg   MCHC 31.9 30.0 - 36.0 g/dL   RDW 33.8 25.0 - 53.9 %   Platelets 280 150 - 400 K/uL   nRBC 0.0 0.0 - 0.2 %  Comprehensive metabolic panel  Result Value Ref Range   Sodium 131 (L) 135 - 145 mmol/L   Potassium 2.9 (L) 3.5 - 5.1 mmol/L   Chloride 95 (L) 98 - 111 mmol/L   CO2 24 22 - 32 mmol/L   Glucose, Bld 104 (H) 70 - 99 mg/dL   BUN 6 6 - 20 mg/dL   Creatinine, Ser 7.67 0.44 - 1.00 mg/dL   Calcium 8.3 (L) 8.9 - 10.3 mg/dL   Total Protein 7.6 6.5 - 8.1 g/dL   Albumin 3.4 (L) 3.5 - 5.0 g/dL   AST 27 15 - 41 U/L   ALT 143 (H) 0 - 44 U/L   Alkaline Phosphatase 124 38 - 126 U/L   Total Bilirubin 2.7 (H) 0.3 - 1.2 mg/dL   GFR, Estimated >34 >19 mL/min   Anion gap 12 5 - 15  Lactic acid, plasma  Result Value Ref Range   Lactic Acid, Venous 1.4 0.5 - 1.9 mmol/L  Lactic acid, plasma  Result Value Ref Range   Lactic Acid, Venous 1.2 0.5 - 1.9 mmol/L  hCG, quantitative, pregnancy  Result Value Ref Range   hCG, Beta Chain, Quant, S <1 <5 mIU/mL  Lipase, blood  Result Value Ref Range   Lipase 86 (H) 11 - 51 U/L  Troponin I (High Sensitivity)  Result Value Ref Range   Troponin I (High Sensitivity) 5 <18 ng/L  Troponin I (High Sensitivity)  Result Value Ref Range   Troponin I (High Sensitivity) 4 <18 ng/L   Laboratory interpretation  all normal except new leukocytosis, hypokalemia, mild elevation of lipase, normal delta troponin     EKG EKG Interpretation  Date/Time:  Monday October 25 2020 17:00:46 EST Ventricular Rate:  121 PR Interval:  136 QRS Duration: 72 QT Interval:  320 QTC Calculation: 454 R Axis:   25 Text Interpretation: Sinus tachycardia Nonspecific ST and T wave abnormality Since last tracing rate faster 22 Oct 2020 Confirmed by Devoria Albe ([id number]) on 10/25/2020 11:33:46 PM   Radiology DG Chest 2 View  Result Date: 10/25/2020 CLINICAL DATA:  Chest pain status post gallbladder surgery on Friday EXAM: CHEST - 2 VIEW COMPARISON:  Chest radiograph October 21, 2020. FINDINGS: The heart size and mediastinal contours are within normal limits. Low lung volumes with hypoventilatory change in the dependent lungs. No visible pneumothorax or significant pleural effusion. Cholecystectomy clips. IMPRESSION: Low lung volumes with bilateral lower lobe and right middle lobe atelectasis, superimposed infection cannot be exclude. Electronically Signed   By: Maudry Mayhew MD   On: 10/25/2020 17:37   CT Angio Chest PE W/Cm &/Or Wo Cm  CT Abdomen Pelvis W Contrast  Result Date: 10/26/2020 CLINICAL DATA:  History of prior cholecystectomy 4 days ago  with abdominal pain and distension with shortness of breath EXAM: CT ANGIOGRAPHY CHEST CT ABDOMEN AND PELVIS WITH CONTRAST TECHNIQUE: Multidetector CT imaging of the chest was performed using the standard protocol during bolus administration of intravenous contrast. Multiplanar CT image reconstructions and MIPs were obtained to evaluate the vascular anatomy. Multidetector CT imaging of the abdomen and pelvis was performed using the standard protocol during bolus administration of intravenous contrast. CONTRAST:  OMNIPAQUE IOHEXOL 350 MG/ML SOLN COMPARISON:  Chest x-ray from the previous day, CT 10/21/2020. FINDINGS: CTA CHEST FINDINGS Cardiovascular: Thoracic aorta and its  branches are within normal limits. No aneurysmal dilatation or dissection is noted. No significant cardiomegaly is seen. Significant motion artifact is noted limiting evaluation of the pulmonary artery although no large central pulmonary embolus is seen. Mediastinum/Nodes: Thoracic inlet is within normal limits. No sizable hilar or mediastinal adenopathy is seen. The esophagus as visualized is within normal limits. Lungs/Pleura: Small bilateral pleural effusions are noted with bilateral lower lobe consolidation left greater than right. No sizable parenchymal nodule is seen. Musculoskeletal: No chest wall abnormality. No acute or significant osseous findings. Review of the MIP images confirms the above findings. CT ABDOMEN and PELVIS FINDINGS Hepatobiliary: Changes consistent with recent cholecystectomy. Minimal air in fluid are noted in the gallbladder fossa which may simply be related to the prior surgery. Possibility biliary leak could not be totally excluded although no significant free fluid is noted within the abdomen. Pancreas: Considerable peripancreatic inflammatory changes are noted without evidence of pancreatic necrosis. These changes suggest acute pancreatitis. Correlation with lipase values is recommended. Spleen: Normal in size without focal abnormality. Adrenals/Urinary Tract: Adrenal glands are within normal limits. Kidneys demonstrate a normal enhancement pattern. No renal calculi or obstructive changes are seen. The bladder is well distended. Stomach/Bowel: The appendix is within normal limits. No obstructive or inflammatory changes of the colon are seen. Small bowel is within normal limits. Stomach demonstrates some mild wall thickening likely reactive to the adjacent pancreas changes. Vascular/Lymphatic: No significant vascular findings are present. No enlarged abdominal or pelvic lymph nodes. Reproductive: Uterus and bilateral adnexa are unremarkable. Other: No abdominal wall hernia or  abnormality. No abdominopelvic ascites. Postsurgical changes are noted consistent with the recent laparoscopic cholecystectomy. Small fat containing periumbilical hernia is noted. This is stable from the prior exam. Musculoskeletal: No acute or significant osseous findings. Review of the MIP images confirms the above findings. IMPRESSION: CTA of the chest: No pulmonary emboli are noted. Small bilateral pleural effusions with lower lobe consolidation left greater than right. CT of the abdomen and pelvis: Changes consistent with the prior cholecystectomy with a small amount of air in fluid in the gallbladder fossa likely related to the postoperative state. No significant intra-abdominal fluid to suggest bile leak is noted at this time. Changes consistent with acute pancreatitis without evidence of pancreatic necrosis. Correlation with lipase levels is recommended. Electronically Signed   By: Alcide Clever M.D.   On: 10/26/2020 03:29     DG Chest 2 View  Result Date: 10/21/2020 CLINICAL DATA:  Chest and abdominal pain  IMPRESSION: No active cardiopulmonary disease. Electronically Signed   By: Jasmine Pang M.D.   On: 10/21/2020 21:12   CT ABDOMEN PELVIS W CONTRAST  Result Date: 10/22/2020 CLINICAL DATA:  Abdominal pain. Suspected biliary obstruction. Right-sided abdominal pain with nausea and vomiting.Musculoskeletal: No acute or significant osseous findings. IMPRESSION: 1. Mild to moderate intra and extrahepatic bile duct dilatation. No obstructing stone or mass is identified. Probable cholelithiasis. Differential  diagnosis may include non radiopaque common duct stones, occult mass, or inflammatory process/stricture. Suggest MRCP to exclude an occult lesion. 2. No evidence of bowel obstruction or inflammation. Normal appendix. 3. Several periumbilical hernias containing fat. Electronically Signed   By: Burman Nieves M.D.   On: 10/22/2020 00:11   MR 3D Recon At Scanner  Result Date:  10/22/2020 CLINICAL DATA:  Abdominal pain.  Evaluate for biliary obstruction. IMPRESSION: 1. Gallstones with gallbladder wall thickening. Findings consistent with acute cholecystitis. 2. No biliary ductal dilatation or signs of choledocholithiasis. 3. Mild diffuse hepatic steatosis. Electronically Signed   By: Signa Kell M.D.   On: 10/22/2020 10:06   MR ABDOMEN MRCP W WO CONTAST  Result Date: 10/22/2020 CLINICAL DATA:  Abdominal pain.  Evaluate for biliary obstruction.IMPRESSION: 1. Gallstones with gallbladder wall thickening. Findings consistent with acute cholecystitis. 2. No biliary ductal dilatation or signs of choledocholithiasis. 3. Mild diffuse hepatic steatosis. Electronically Signed   By: Signa Kell M.D.   On: 10/22/2020 10:06   US Abdomen Limited RUQ (LIVER/GB)  Result Date: 10/22/2020 CLINICAL DATA:  29 year old female with right upper quadrant pain. Nausea vomiting today. Abnormal LFTs.IMPRESSION: 1. Gallstones, gallbladder wall thickening, biliary ductal dilatation (as seen by CT) and positive sonographic Murphy sign. 2. This constellation is suspicious for Choledocholithiasis with possible superimposed Acute Cholecystitis. Electronically Signed   By: Odessa Fleming M.D.   On: 10/22/2020 08:24     Procedures Procedures (including critical care time)  Medications Ordered in ED Medications  cefTRIAXone (ROCEPHIN) 1 g in sodium chloride 0.9 % 100 mL IVPB (has no administration in time range)  azithromycin (ZITHROMAX) 500 mg in sodium chloride 0.9 % 250 mL IVPB (has no administration in time range)  morphine 4 MG/ML injection 4 mg (4 mg Intravenous Given 10/26/20 0021)  ondansetron (ZOFRAN) injection 4 mg (4 mg Intravenous Given 10/26/20 0020)  iohexol (OMNIPAQUE) 350 MG/ML injection 100 mL (100 mLs Intravenous Contrast Given 10/26/20 0308)    ED Course  I have reviewed the triage vital signs and the nursing notes.  Pertinent labs & imaging results that were available during  my care of the patient were reviewed by me and considered in my medical decision making (see chart for details).    MDM Rules/Calculators/A&P                         Patient appears tachypneic at rest.  Her pulse ox was 91% however during our interview it dropped down to 88%.  She also had a significant tachycardia on the monitor which is new.  Concern was for pneumonia/PE or abscess underneath the diaphragm.  CTA of the chest with CT abdomen/pelvis was ordered.  She was given IV pain and nausea medication.  She was placed on oxygen.  After reviewing her CT results lipase was added to her blood work, and she was started on community-acquired pneumonia antibiotics.  4:20 AM Dr. Thomes Dinning, hospitalist will admit  I have informed patient of her test results and need for admission and she is agreeable.  Final Clinical Impression(s) / ED Diagnoses Final diagnoses:  Atypical chest pain  Community acquired pneumonia, unspecified laterality  Hypoxia  Acute pancreatitis without infection or necrosis, unspecified pancreatitis type    Rx / DC Orders  Plan admission  Devoria Albe, MD, Concha Pyo, MD 10/26/20 [phone]

## 2020-10-26 NOTE — Telephone Encounter (Signed)
Patient has been admitted.

## 2020-10-26 NOTE — Plan of Care (Signed)

## 2020-10-26 NOTE — Progress Notes (Signed)
Pharmacy Antibiotic Note  Patricia [name] is a 29 y.o. female admitted on 10/25/2020 with intraabdominal infection and bile leak.  Pharmacy has been consulted for zosyn dosing.  Plan: Zosyn 3.375gm IV q8h (EI) Monitor for clinical course,  cultures and length of therapy  Height: 5\' 7"  (170.2 cm) Weight: (!) 156.9 kg (346 lb) IBW/kg (Calculated) : 61.6  Temp (24hrs), Avg:100 F (37.8 C), Min:100 F (37.8 C), Max:100 F (37.8 C)  Recent Labs  Lab 10/21/20 2150 10/22/20 0353 10/22/20 0936 10/23/20 0812 10/25/20 2353 10/26/20 0215 10/26/20 0647  WBC 4.9 4.4 3.8*  --  18.9*  --   --   CREATININE 0.89 0.82  --  0.79 0.97  --  0.79  LATICACIDVEN  --   --   --   --  1.4 1.2  --     Estimated Creatinine Clearance: 164.8 mL/min (by C-G formula based on SCr of 0.79 mg/dL).    No Known Allergies  Antimicrobials this admission: 11/30 Zosyn >>   Deanta Mincey A. 12/30, PharmD, BCPS, FNKF Clinical Pharmacist [place] Please utilize Amion for appropriate phone number to reach the unit pharmacist [facility])     10/26/2020 6:50 PM

## 2020-10-27 ENCOUNTER — Encounter (HOSPITAL_COMMUNITY): Payer: Self-pay | Admitting: Internal Medicine

## 2020-10-27 ENCOUNTER — Inpatient Hospital Stay (HOSPITAL_COMMUNITY): Payer: 59

## 2020-10-27 DIAGNOSIS — J189 Pneumonia, unspecified organism: Secondary | ICD-10-CM | POA: Diagnosis not present

## 2020-10-27 DIAGNOSIS — A419 Sepsis, unspecified organism: Secondary | ICD-10-CM | POA: Diagnosis not present

## 2020-10-27 DIAGNOSIS — J9600 Acute respiratory failure, unspecified whether with hypoxia or hypercapnia: Secondary | ICD-10-CM | POA: Diagnosis not present

## 2020-10-27 DIAGNOSIS — R1010 Upper abdominal pain, unspecified: Secondary | ICD-10-CM | POA: Diagnosis not present

## 2020-10-27 DIAGNOSIS — K859 Acute pancreatitis without necrosis or infection, unspecified: Secondary | ICD-10-CM | POA: Diagnosis present

## 2020-10-27 DIAGNOSIS — R6520 Severe sepsis without septic shock: Secondary | ICD-10-CM | POA: Diagnosis not present

## 2020-10-27 DIAGNOSIS — Z6841 Body Mass Index (BMI) 40.0 and over, adult: Secondary | ICD-10-CM | POA: Diagnosis not present

## 2020-10-27 DIAGNOSIS — R109 Unspecified abdominal pain: Secondary | ICD-10-CM | POA: Diagnosis not present

## 2020-10-27 LAB — COMPREHENSIVE METABOLIC PANEL
ALT: 75 U/L — ABNORMAL HIGH (ref 0–44)
AST: 17 U/L (ref 15–41)
Albumin: 2.7 g/dL — ABNORMAL LOW (ref 3.5–5.0)
Alkaline Phosphatase: 97 U/L (ref 38–126)
Anion gap: 9 (ref 5–15)
BUN: 7 mg/dL (ref 6–20)
CO2: 28 mmol/L (ref 22–32)
Calcium: 8.1 mg/dL — ABNORMAL LOW (ref 8.9–10.3)
Chloride: 97 mmol/L — ABNORMAL LOW (ref 98–111)
Creatinine, Ser: 0.78 mg/dL (ref 0.44–1.00)
GFR, Estimated: >60 mL/min (ref >60)
Glucose, Bld: 110 mg/dL — ABNORMAL HIGH (ref 70–99)
Potassium: 2.8 mmol/L — ABNORMAL LOW (ref 3.5–5.1)
Sodium: 134 mmol/L — ABNORMAL LOW (ref 135–145)
Total Bilirubin: 1.4 mg/dL — ABNORMAL HIGH (ref 0.3–1.2)
Total Protein: 6.5 g/dL (ref 6.5–8.1)

## 2020-10-27 LAB — CBC
HCT: 32.8 % — ABNORMAL LOW (ref 36.0–46.0)
Hemoglobin: 10.5 g/dL — ABNORMAL LOW (ref 12.0–15.0)
MCH: 26.9 pg (ref 26.0–34.0)
MCHC: 32 g/dL (ref 30.0–36.0)
MCV: 83.9 fL (ref 80.0–100.0)
Platelets: 237 10*3/uL (ref 150–400)
RBC: 3.91 MIL/uL (ref 3.87–5.11)
RDW: 13.7 % (ref 11.5–15.5)
WBC: 12.5 10*3/uL — ABNORMAL HIGH (ref 4.0–10.5)
nRBC: 0 % (ref 0.0–0.2)

## 2020-10-27 LAB — GLUCOSE, CAPILLARY
Glucose-Capillary: 74 mg/dL (ref 70–99)
Glucose-Capillary: 118 mg/dL — ABNORMAL HIGH (ref 70–99)

## 2020-10-27 LAB — LIPASE, BLOOD: Lipase: 95 U/L — ABNORMAL HIGH (ref 11–51)

## 2020-10-27 LAB — PROTIME-INR
INR: 1.1 (ref 0.8–1.2)
Prothrombin Time: 13.8 seconds (ref 11.4–15.2)

## 2020-10-27 LAB — MAGNESIUM: Magnesium: 2.5 mg/dL — ABNORMAL HIGH (ref 1.7–2.4)

## 2020-10-27 LAB — APTT: aPTT: 35 seconds (ref 24–36)

## 2020-10-27 MED ORDER — ENOXAPARIN SODIUM 80 MG/0.8ML ~~LOC~~ SOLN
80.0000 mg | SUBCUTANEOUS | Status: DC
  Administered 2020-10-28 – 2020-10-30 (×3): 80 mg via SUBCUTANEOUS
  Filled 2020-10-27 (×3): qty 0.8

## 2020-10-27 MED ORDER — PANTOPRAZOLE SODIUM 40 MG IV SOLR
40.0000 mg | INTRAVENOUS | Status: DC
  Administered 2020-10-27 – 2020-10-28 (×2): 40 mg via INTRAVENOUS
  Filled 2020-10-27 (×2): qty 40

## 2020-10-27 MED ORDER — POTASSIUM CHLORIDE CRYS ER 20 MEQ PO TBCR
40.0000 meq | EXTENDED_RELEASE_TABLET | ORAL | Status: AC
  Administered 2020-10-27 (×2): 40 meq via ORAL
  Filled 2020-10-27 (×2): qty 2

## 2020-10-27 MED ORDER — KCL IN DEXTROSE-NACL 20-5-0.45 MEQ/L-%-% IV SOLN: INTRAVENOUS | Status: AC

## 2020-10-27 MED ORDER — TECHNETIUM TC 99M MEBROFENIN IV KIT
5.0000 | PACK | Freq: Once | INTRAVENOUS | Status: AC | PRN
  Administered 2020-10-27: 5.5 via INTRAVENOUS

## 2020-10-27 NOTE — Progress Notes (Signed)
   10/27/20 1725  Assess: MEWS Score  Temp (!) 102.2 F (39 C) (MD notified)  BP 112/66  Pulse Rate (!) 118  Resp 16  Level of Consciousness Alert  SpO2 94 %  O2 Device Room Air  Assess: MEWS Score  MEWS Temp 2  MEWS Systolic 0  MEWS Pulse 2  MEWS RR 0  MEWS LOC 0  MEWS Score 4  MEWS Score Color Red  Assess: if the MEWS score is Yellow or Red  Were vital signs taken at a resting state? Yes  Focused Assessment No change from prior assessment  Early Detection of Sepsis Score *See Row Information* Low  MEWS guidelines implemented *See Row Information* No, previously yellow, continue vital signs every 4 hours  Treat  MEWS Interventions Administered scheduled meds/treatments;Administered prn meds/treatments;Other (Comment) (notifed MD)  Pain Scale 0-10  Pain Score 6  Pain Type Acute pain  Pain Location Abdomen  Patients Stated Pain Goal 5  Pain Intervention(s) Emotional support  Take Vital Signs  Increase Vital Sign Frequency  Red: Q 1hr X 4 then Q 4hr X 4, if remains red, continue Q 4hrs  Escalate  MEWS: Escalate Red: discuss with charge nurse/RN and provider, consider discussing with RRT  Notify: Charge Nurse/RN  Name of Charge Nurse/RN Notified Tonna Boehringer RN  Date Charge Nurse/RN Notified 10/27/20  Time Charge Nurse/RN Notified 1737  Notify: Provider  Provider Name/Title [facility]  Date Provider Notified 10/27/20  Time Provider Notified 1735  Notification Type  (secure chat)  Notification Reason Change in status  Response See new orders  Date of Provider Response 10/27/20  Time of Provider Response 1735

## 2020-10-27 NOTE — Plan of Care (Signed)

## 2020-10-27 NOTE — Progress Notes (Signed)
Patient Demographics:    Patricia [name], is a 29 y.o. female, DOB - May 23, 1991, [id number]  Admit date - 10/25/2020   Admitting Physician Frankey Shown, DO  Outpatient Primary MD for the patient is Golden Pop, FNP  LOS - 1   Chief Complaint  Patient presents with  . Chest Pain        Subjective:    [name] today has no fevers, no emesis,  No chest pain,   -Abdominal pain and nausea persist  Assessment  & Plan :    Principal Problem:   Acute Gallstone Pancreatitis Active Problems:   Morbid obesity with BMI of 50.0-59.9, adult (HCC)   Abdominal pain   Atypical chest pain   High total bilirubin   Pneumonia  Brief Summary- 29 year old with past medical history relevant for morbid obesity, depression/anxiety and cholelithiasis with recent lap chole on 10/23/2020  -Readmitted on 10/25/2020 with work-up so far suggestive of acute gallstone pancreatitis  A/p 1) acute gallstone pancreatitis--patient had cholelithiasis recently had lap chole on 10/23/2020 -Abdominal ultrasound, CT abdomen and pelvis as well as HIDA scan noted -No evidence of biliary leak or postop abscess -Discussed with general surgeon Dr. Henreitta Leber -Medical management for acute otitis with n.p.o. status, IV fluids and PPI with as needed antiemetics and pain medications  2)Morbid Obesity-this complicates overall care and input decreases mortality morbidity -after resolution of acute pancreatitis patient will be advised to adhere to low calorie diet, portion control and increase physical activity discussed with patient -Body mass index is 54.19 kg/m.  3)PNA in a postop patient--concern will be for possible aspiration pneumonia--no hypoxia, continue IV Zosyn, anticipate discharge on p.o. Augmentin possibly  Disposition/Need for in-Hospital Stay- patient unable to be discharged at this time due to --- acute  pancreatitis requiring n.p.o. status IV fluids and IV antiemetics and IV morphine sulfate for pain control, pneumonia requiring IV antibiotics  Status is: Inpatient  Remains inpatient appropriate because:acute pancreatitis requiring n.p.o. status IV fluids and IV antiemetics and IV morphine sulfate for pain control, pneumonia requiring IV antibiotics   Disposition: The patient is from: Home              Anticipated d/c is to: Home              Anticipated d/c date is: 1 day              Patient currently is not medically stable to d/c. Barriers: Not Clinically Stable- acute pancreatitis requiring n.p.o. status IV fluids and IV antiemetics and IV morphine sulfate for pain control, pneumonia requiring IV antibiotics   Code Status : full   Family Communication:    NA (patient is alert, awake and coherent)  Consults  :  gen surgery  DVT Prophylaxis  :  Lovenox -  - SCDs *   Lab Results  Component Value Date   PLT 237 10/27/2020    Inpatient Medications  Scheduled Meds: . dextromethorphan-guaiFENesin  1 tablet Oral BID  . [START ON 10/28/2020] enoxaparin (LOVENOX) injection  80 mg Subcutaneous Q24H  . lidocaine  1 patch Transdermal Once  . pantoprazole (PROTONIX) IV  40 mg Intravenous Q24H  . potassium chloride  40 mEq Oral Q3H   Continuous Infusions: .  dextrose 5 % and 0.45 % NaCl with KCl 20 mEq/L 125 mL/hr at 10/27/20 1323  . piperacillin-tazobactam (ZOSYN)  IV 3.375 g (10/27/20 1109)   PRN Meds:.acetaminophen, morphine injection    Anti-infectives (From admission, onward)   Start     Dose/Rate Route Frequency Ordered Stop   10/27/20 0015  cefTRIAXone (ROCEPHIN) 2 g in sodium chloride 0.9 % 100 mL IVPB  Status:  Discontinued        2 g 200 mL/hr over 30 Minutes Intravenous Every 24 hours 10/26/20 0443 10/26/20 1842   10/27/20 0015  azithromycin (ZITHROMAX) 500 mg in sodium chloride 0.9 % 250 mL IVPB  Status:  Discontinued        500 mg 250 mL/hr over 60 Minutes  Intravenous Every 24 hours 10/26/20 0443 10/26/20 1842   10/26/20 1945  piperacillin-tazobactam (ZOSYN) IVPB 3.375 g        3.375 g 12.5 mL/hr over 240 Minutes Intravenous Every 8 hours 10/26/20 1853     10/26/20 0400  cefTRIAXone (ROCEPHIN) 1 g in sodium chloride 0.9 % 100 mL IVPB        1 g 200 mL/hr over 30 Minutes Intravenous  Once 10/26/20 0357 10/26/20 0504   10/26/20 0400  azithromycin (ZITHROMAX) 500 mg in sodium chloride 0.9 % 250 mL IVPB        500 mg 250 mL/hr over 60 Minutes Intravenous  Once 10/26/20 0357 10/26/20 0642        Objective:   Vitals:   10/26/20 2202 10/26/20 2249 10/27/20 0440 10/27/20 1438  BP: 135/79 120/66 130/79 (!) 135/94  Pulse: (!) 122 (!) 108 (!) 102 (!) 118  Resp: (!) 21 20 20 19   Temp: 99.7 F (37.6 C)  99.4 F (37.4 C) 98.8 F (37.1 C)  TempSrc:   Oral Oral  SpO2: 95%  93% 98%  Weight:      Height:        Wt Readings from Last 3 Encounters:  10/25/20 (!) 156.9 kg  10/22/20 (!) 156 kg  11/05/19 (!) 147.9 kg     Intake/Output Summary (Last 24 hours) at 10/27/2020 1709 Last data filed at 10/27/2020 1500 Gross per 24 hour  Intake 1606.56 ml  Output --  Net 1606.56 ml     Physical Exam  Gen:- Awake Alert, morbidly obese HEENT:- [place].AT, No sclera icterus Neck-Supple Neck,No JVD,.  Lungs-  CTAB , fair symmetrical air movement CV- S1, S2 normal, regular  Abd-  +ve B.Sounds, Abd Soft, epigastric and  upper quadrant area tenderness without rebound or guarding, increased truncal adiposity  extremity/Skin:- No  edema, pedal pulses present  Psych-affect is appropriate, oriented x3 Neuro-no new focal deficits, no tremors   Data Review:   Micro Results Recent Results (from the past 240 hour(s))  Resp Panel by RT-PCR (Flu A&B, Covid) Nasopharyngeal Swab     Status: None   Collection Time: 10/22/20  1:20 AM   Specimen: Nasopharyngeal Swab; Nasopharyngeal(NP) swabs in vial transport medium  Result Value Ref Range Status   SARS  Coronavirus 2 by RT PCR NEGATIVE NEGATIVE Final    Comment: (NOTE) SARS-CoV-2 target nucleic acids are NOT DETECTED.  The SARS-CoV-2 RNA is generally detectable in upper respiratory specimens during the acute phase of infection. The lowest concentration of SARS-CoV-2 viral copies this assay can detect is 138 copies/mL. A negative result does not preclude SARS-Cov-2 infection and should not be used as the sole basis for treatment or other patient management decisions. A negative result  may occur with  improper specimen collection/handling, submission of specimen other than nasopharyngeal swab, presence of viral mutation(s) within the areas targeted by this assay, and inadequate number of viral copies(<138 copies/mL). A negative result must be combined with clinical observations, patient history, and epidemiological information. The expected result is Negative.  Fact Sheet for Patients:  BloggerCourse.com  Fact Sheet for Healthcare Providers:  SeriousBroker.it  This test is no t yet approved or cleared by the [place] FDA and  has been authorized for detection and/or diagnosis of SARS-CoV-2 by FDA under an Emergency Use Authorization (EUA). This EUA will remain  in effect (meaning this test can be used) for the duration of the COVID-19 declaration under Section 564(b)(1) of the Act, 21 U.S.C.section 360bbb-3(b)(1), unless the authorization is terminated  or revoked sooner.       Influenza A by PCR NEGATIVE NEGATIVE Final   Influenza B by PCR NEGATIVE NEGATIVE Final    Comment: (NOTE) The Xpert Xpress SARS-CoV-2/FLU/RSV plus assay is intended as an aid in the diagnosis of influenza from Nasopharyngeal swab specimens and should not be used as a sole basis for treatment. Nasal washings and aspirates are unacceptable for Xpert Xpress SARS-CoV-2/FLU/RSV testing.  Fact Sheet for  Patients: BloggerCourse.com  Fact Sheet for Healthcare Providers: SeriousBroker.it  This test is not yet approved or cleared by the [place] FDA and has been authorized for detection and/or diagnosis of SARS-CoV-2 by FDA under an Emergency Use Authorization (EUA). This EUA will remain in effect (meaning this test can be used) for the duration of the COVID-19 declaration under Section 564(b)(1) of the Act, 21 U.S.C. section 360bbb-3(b)(1), unless the authorization is terminated or revoked.  Performed at [facility], [address], [place], [place] [address]   Resp Panel by RT-PCR (Flu A&B, Covid) Nasopharyngeal Swab     Status: None   Collection Time: 10/26/20  4:15 AM   Specimen: Nasopharyngeal Swab; Nasopharyngeal(NP) swabs in vial transport medium  Result Value Ref Range Status   SARS Coronavirus 2 by RT PCR NEGATIVE NEGATIVE Final    Comment: (NOTE) SARS-CoV-2 target nucleic acids are NOT DETECTED.  The SARS-CoV-2 RNA is generally detectable in upper respiratory specimens during the acute phase of infection. The lowest concentration of SARS-CoV-2 viral copies this assay can detect is 138 copies/mL. A negative result does not preclude SARS-Cov-2 infection and should not be used as the sole basis for treatment or other patient management decisions. A negative result may occur with  improper specimen collection/handling, submission of specimen other than nasopharyngeal swab, presence of viral mutation(s) within the areas targeted by this assay, and inadequate number of viral copies(<138 copies/mL). A negative result must be combined with clinical observations, patient history, and epidemiological information. The expected result is Negative.  Fact Sheet for Patients:  BloggerCourse.com  Fact Sheet for Healthcare Providers:  SeriousBroker.it  This test is no t yet approved  or cleared by the [place] FDA and  has been authorized for detection and/or diagnosis of SARS-CoV-2 by FDA under an Emergency Use Authorization (EUA). This EUA will remain  in effect (meaning this test can be used) for the duration of the COVID-19 declaration under Section 564(b)(1) of the Act, 21 U.S.C.section 360bbb-3(b)(1), unless the authorization is terminated  or revoked sooner.       Influenza A by PCR NEGATIVE NEGATIVE Final   Influenza B by PCR NEGATIVE NEGATIVE Final    Comment: (NOTE) The Xpert Xpress SARS-CoV-2/FLU/RSV plus assay is intended as an  aid in the diagnosis of influenza from Nasopharyngeal swab specimens and should not be used as a sole basis for treatment. Nasal washings and aspirates are unacceptable for Xpert Xpress SARS-CoV-2/FLU/RSV testing.  Fact Sheet for Patients: BloggerCourse.com  Fact Sheet for Healthcare Providers: SeriousBroker.it  This test is not yet approved or cleared by the [place] FDA and has been authorized for detection and/or diagnosis of SARS-CoV-2 by FDA under an Emergency Use Authorization (EUA). This EUA will remain in effect (meaning this test can be used) for the duration of the COVID-19 declaration under Section 564(b)(1) of the Act, 21 U.S.C. section 360bbb-3(b)(1), unless the authorization is terminated or revoked.  Performed at [facility], [address] [place], [place] [address]   Culture, blood (routine x 2) Call MD if unable to obtain prior to antibiotics being given     Status: None (Preliminary result)   Collection Time: 10/26/20  6:47 AM   Specimen: BLOOD  Result Value Ref Range Status   Specimen Description BLOOD RIGHT ANTECUBITAL  Final   Special Requests   Final    BOTTLES DRAWN AEROBIC AND ANAEROBIC Blood Culture results may not be optimal due to an inadequate volume of blood received in culture bottles   Culture   Final    NO GROWTH < 12  HOURS Performed at [facility], [address] [place], [place] [address]    Report Status PENDING  Incomplete  Culture, blood (routine x 2) Call MD if unable to obtain prior to antibiotics being given     Status: None (Preliminary result)   Collection Time: 10/26/20  6:47 AM   Specimen: BLOOD  Result Value Ref Range Status   Specimen Description BLOOD LEFT ANTECUBITAL  Final   Special Requests   Final    BOTTLES DRAWN AEROBIC AND ANAEROBIC Blood Culture adequate volume   Culture   Final    NO GROWTH < 12 HOURS Performed at [facility], [address], [place], [place] [address]    Report Status PENDING  Incomplete    Radiology Reports DG Chest 2 View  Result Date: 10/25/2020 CLINICAL DATA:  Chest pain status post gallbladder surgery on Friday EXAM: CHEST - 2 VIEW COMPARISON:  Chest radiograph October 21, 2020. FINDINGS: The heart size and mediastinal contours are within normal limits. Low lung volumes with hypoventilatory change in the dependent lungs. No visible pneumothorax or significant pleural effusion. Cholecystectomy clips. IMPRESSION: Low lung volumes with bilateral lower lobe and right middle lobe atelectasis, superimposed infection cannot be exclude. Electronically Signed   By: Maudry Mayhew MD   On: 10/25/2020 17:37   DG Chest 2 View  Result Date: 10/21/2020 CLINICAL DATA:  Chest and abdominal pain EXAM: CHEST - 2 VIEW COMPARISON:  06/10/2018 FINDINGS: The heart size and mediastinal contours are within normal limits. Both lungs are clear. The visualized skeletal structures are unremarkable. IMPRESSION: No active cardiopulmonary disease. Electronically Signed   By: Jasmine Pang M.D.   On: 10/21/2020 21:12   CT Angio Chest PE W/Cm &/Or Wo Cm  Result Date: 10/26/2020 CLINICAL DATA:  History of prior cholecystectomy 4 days ago with abdominal pain and distension with shortness of breath EXAM: CT ANGIOGRAPHY CHEST CT ABDOMEN AND PELVIS WITH CONTRAST TECHNIQUE: Multidetector  CT imaging of the chest was performed using the standard protocol during bolus administration of intravenous contrast. Multiplanar CT image reconstructions and MIPs were obtained to evaluate the vascular anatomy. Multidetector CT imaging of the abdomen and pelvis was performed using the  standard protocol during bolus administration of intravenous contrast. CONTRAST:  OMNIPAQUE IOHEXOL 350 MG/ML SOLN COMPARISON:  Chest x-ray from the previous day, CT 10/21/2020. FINDINGS: CTA CHEST FINDINGS Cardiovascular: Thoracic aorta and its branches are within normal limits. No aneurysmal dilatation or dissection is noted. No significant cardiomegaly is seen. Significant motion artifact is noted limiting evaluation of the pulmonary artery although no large central pulmonary embolus is seen. Mediastinum/Nodes: Thoracic inlet is within normal limits. No sizable hilar or mediastinal adenopathy is seen. The esophagus as visualized is within normal limits. Lungs/Pleura: Small bilateral pleural effusions are noted with bilateral lower lobe consolidation left greater than right. No sizable parenchymal nodule is seen. Musculoskeletal: No chest wall abnormality. No acute or significant osseous findings. Review of the MIP images confirms the above findings. CT ABDOMEN and PELVIS FINDINGS Hepatobiliary: Changes consistent with recent cholecystectomy. Minimal air in fluid are noted in the gallbladder fossa which may simply be related to the prior surgery. Possibility biliary leak could not be totally excluded although no significant free fluid is noted within the abdomen. Pancreas: Considerable peripancreatic inflammatory changes are noted without evidence of pancreatic necrosis. These changes suggest acute pancreatitis. Correlation with lipase values is recommended. Spleen: Normal in size without focal abnormality. Adrenals/Urinary Tract: Adrenal glands are within normal limits. Kidneys demonstrate a normal enhancement pattern. No  renal calculi or obstructive changes are seen. The bladder is well distended. Stomach/Bowel: The appendix is within normal limits. No obstructive or inflammatory changes of the colon are seen. Small bowel is within normal limits. Stomach demonstrates some mild wall thickening likely reactive to the adjacent pancreas changes. Vascular/Lymphatic: No significant vascular findings are present. No enlarged abdominal or pelvic lymph nodes. Reproductive: Uterus and bilateral adnexa are unremarkable. Other: No abdominal wall hernia or abnormality. No abdominopelvic ascites. Postsurgical changes are noted consistent with the recent laparoscopic cholecystectomy. Small fat containing periumbilical hernia is noted. This is stable from the prior exam. Musculoskeletal: No acute or significant osseous findings. Review of the MIP images confirms the above findings. IMPRESSION: CTA of the chest: No pulmonary emboli are noted. Small bilateral pleural effusions with lower lobe consolidation left greater than right. CT of the abdomen and pelvis: Changes consistent with the prior cholecystectomy with a small amount of air in fluid in the gallbladder fossa likely related to the postoperative state. No significant intra-abdominal fluid to suggest bile leak is noted at this time. Changes consistent with acute pancreatitis without evidence of pancreatic necrosis. Correlation with lipase levels is recommended. Electronically Signed   By: Alcide Clever M.D.   On: 10/26/2020 03:29   CT Abdomen Pelvis W Contrast  Result Date: 10/26/2020 CLINICAL DATA:  History of prior cholecystectomy 4 days ago with abdominal pain and distension with shortness of breath EXAM: CT ANGIOGRAPHY CHEST CT ABDOMEN AND PELVIS WITH CONTRAST TECHNIQUE: Multidetector CT imaging of the chest was performed using the standard protocol during bolus administration of intravenous contrast. Multiplanar CT image reconstructions and MIPs were obtained to evaluate the vascular  anatomy. Multidetector CT imaging of the abdomen and pelvis was performed using the standard protocol during bolus administration of intravenous contrast. CONTRAST:  OMNIPAQUE IOHEXOL 350 MG/ML SOLN COMPARISON:  Chest x-ray from the previous day, CT 10/21/2020. FINDINGS: CTA CHEST FINDINGS Cardiovascular: Thoracic aorta and its branches are within normal limits. No aneurysmal dilatation or dissection is noted. No significant cardiomegaly is seen. Significant motion artifact is noted limiting evaluation of the pulmonary artery although no large central pulmonary embolus is seen.  Mediastinum/Nodes: Thoracic inlet is within normal limits. No sizable hilar or mediastinal adenopathy is seen. The esophagus as visualized is within normal limits. Lungs/Pleura: Small bilateral pleural effusions are noted with bilateral lower lobe consolidation left greater than right. No sizable parenchymal nodule is seen. Musculoskeletal: No chest wall abnormality. No acute or significant osseous findings. Review of the MIP images confirms the above findings. CT ABDOMEN and PELVIS FINDINGS Hepatobiliary: Changes consistent with recent cholecystectomy. Minimal air in fluid are noted in the gallbladder fossa which may simply be related to the prior surgery. Possibility biliary leak could not be totally excluded although no significant free fluid is noted within the abdomen. Pancreas: Considerable peripancreatic inflammatory changes are noted without evidence of pancreatic necrosis. These changes suggest acute pancreatitis. Correlation with lipase values is recommended. Spleen: Normal in size without focal abnormality. Adrenals/Urinary Tract: Adrenal glands are within normal limits. Kidneys demonstrate a normal enhancement pattern. No renal calculi or obstructive changes are seen. The bladder is well distended. Stomach/Bowel: The appendix is within normal limits. No obstructive or inflammatory changes of the colon are seen. Small bowel  is within normal limits. Stomach demonstrates some mild wall thickening likely reactive to the adjacent pancreas changes. Vascular/Lymphatic: No significant vascular findings are present. No enlarged abdominal or pelvic lymph nodes. Reproductive: Uterus and bilateral adnexa are unremarkable. Other: No abdominal wall hernia or abnormality. No abdominopelvic ascites. Postsurgical changes are noted consistent with the recent laparoscopic cholecystectomy. Small fat containing periumbilical hernia is noted. This is stable from the prior exam. Musculoskeletal: No acute or significant osseous findings. Review of the MIP images confirms the above findings. IMPRESSION: CTA of the chest: No pulmonary emboli are noted. Small bilateral pleural effusions with lower lobe consolidation left greater than right. CT of the abdomen and pelvis: Changes consistent with the prior cholecystectomy with a small amount of air in fluid in the gallbladder fossa likely related to the postoperative state. No significant intra-abdominal fluid to suggest bile leak is noted at this time. Changes consistent with acute pancreatitis without evidence of pancreatic necrosis. Correlation with lipase levels is recommended. Electronically Signed   By: Alcide Clever M.D.   On: 10/26/2020 03:29   CT ABDOMEN PELVIS W CONTRAST  Result Date: 10/22/2020 CLINICAL DATA:  Abdominal pain. Suspected biliary obstruction. Right-sided abdominal pain with nausea and vomiting. EXAM: CT ABDOMEN AND PELVIS WITH CONTRAST TECHNIQUE: Multidetector CT imaging of the abdomen and pelvis was performed using the standard protocol following bolus administration of intravenous contrast. CONTRAST:  OMNIPAQUE IOHEXOL 300 MG/ML  SOLN COMPARISON:  CT chest 06/10/2018.  CT abdomen and pelvis 06/03/2012 FINDINGS: Lower chest: The lung bases are clear. Hepatobiliary: Mild to moderate intra and extrahepatic bile duct dilatation. No obstructing stone or mass is identified. Suggest  MRCP to exclude an occult lesion. Suggestion of vague attenuation change in the gallbladder neck probably representing gallstones. No gallbladder wall thickening or infiltration. No focal liver lesions. Pancreas: Unremarkable. No pancreatic ductal dilatation or surrounding inflammatory changes. Spleen: Normal in size without focal abnormality. Adrenals/Urinary Tract: Adrenal glands are unremarkable. Kidneys are normal, without renal calculi, focal lesion, or hydronephrosis. Bladder is unremarkable. Stomach/Bowel: Stomach is within normal limits. Appendix appears normal. No evidence of bowel wall thickening, distention, or inflammatory changes. Vascular/Lymphatic: No significant vascular findings are present. No enlarged abdominal or pelvic lymph nodes. Reproductive: Uterus and bilateral adnexa are unremarkable. Other: No free air or free fluid in the abdomen. Several periumbilical hernias containing fat. Musculoskeletal: No acute or significant osseous findings.  IMPRESSION: 1. Mild to moderate intra and extrahepatic bile duct dilatation. No obstructing stone or mass is identified. Probable cholelithiasis. Differential diagnosis may include non radiopaque common duct stones, occult mass, or inflammatory process/stricture. Suggest MRCP to exclude an occult lesion. 2. No evidence of bowel obstruction or inflammation. Normal appendix. 3. Several periumbilical hernias containing fat. Electronically Signed   By: Burman Nieves M.D.   On: 10/22/2020 00:11   MR 3D Recon At Scanner  Result Date: 10/22/2020 CLINICAL DATA:  Abdominal pain.  Evaluate for biliary obstruction. EXAM: MRI ABDOMEN WITHOUT AND WITH CONTRAST (INCLUDING MRCP) TECHNIQUE: Multiplanar multisequence MR imaging of the abdomen was performed both before and after the administration of intravenous contrast. Heavily T2-weighted images of the biliary and pancreatic ducts were obtained, and three-dimensional MRCP images were rendered by post processing.  CONTRAST:  10mL GADAVIST GADOBUTROL 1 MMOL/ML IV SOLN COMPARISON:  Ultrasound 10/22/2020 FINDINGS: Lower chest: No acute findings. Hepatobiliary: Mild diffuse hepatic steatosis. Within the limitations of unenhanced technique there is no focal liver abnormality. The gallbladder wall appears mildly thickened measuring up to 4 mm. Stone within the proximal gallbladder measures 1.5 cm. No biliary ductal dilatation. The common bile duct measures 5 mm in diameter. No signs of choledocholithiasis. Pancreas: No mass, inflammatory changes, or other parenchymal abnormality identified. Spleen:  Within normal limits in size and appearance. Adrenals/Urinary Tract: Normal appearance of the adrenal glands. The kidneys are unremarkable. Stomach/Bowel: Visualized portions within the abdomen are unremarkable. Vascular/Lymphatic: No pathologically enlarged lymph nodes identified. No abdominal aortic aneurysm demonstrated. Other:  No free fluid or fluid collections. Musculoskeletal: No suspicious bone lesions identified. IMPRESSION: 1. Gallstones with gallbladder wall thickening. Findings consistent with acute cholecystitis. 2. No biliary ductal dilatation or signs of choledocholithiasis. 3. Mild diffuse hepatic steatosis. Electronically Signed   By: Signa Kell M.D.   On: 10/22/2020 10:06   MR ABDOMEN MRCP W WO CONTAST  Result Date: 10/22/2020 CLINICAL DATA:  Abdominal pain.  Evaluate for biliary obstruction. EXAM: MRI ABDOMEN WITHOUT AND WITH CONTRAST (INCLUDING MRCP) TECHNIQUE: Multiplanar multisequence MR imaging of the abdomen was performed both before and after the administration of intravenous contrast. Heavily T2-weighted images of the biliary and pancreatic ducts were obtained, and three-dimensional MRCP images were rendered by post processing. CONTRAST:  10mL GADAVIST GADOBUTROL 1 MMOL/ML IV SOLN COMPARISON:  Ultrasound 10/22/2020 FINDINGS: Lower chest: No acute findings. Hepatobiliary: Mild diffuse hepatic steatosis.  Within the limitations of unenhanced technique there is no focal liver abnormality. The gallbladder wall appears mildly thickened measuring up to 4 mm. Stone within the proximal gallbladder measures 1.5 cm. No biliary ductal dilatation. The common bile duct measures 5 mm in diameter. No signs of choledocholithiasis. Pancreas: No mass, inflammatory changes, or other parenchymal abnormality identified. Spleen:  Within normal limits in size and appearance. Adrenals/Urinary Tract: Normal appearance of the adrenal glands. The kidneys are unremarkable. Stomach/Bowel: Visualized portions within the abdomen are unremarkable. Vascular/Lymphatic: No pathologically enlarged lymph nodes identified. No abdominal aortic aneurysm demonstrated. Other:  No free fluid or fluid collections. Musculoskeletal: No suspicious bone lesions identified. IMPRESSION: 1. Gallstones with gallbladder wall thickening. Findings consistent with acute cholecystitis. 2. No biliary ductal dilatation or signs of choledocholithiasis. 3. Mild diffuse hepatic steatosis. Electronically Signed   By: Signa Kell M.D.   On: 10/22/2020 10:06   NM HEPATO BILIARY LEAK  Result Date: 10/27/2020 CLINICAL DATA:  Upper abdominal pain status post cholecystectomy. EXAM: NUCLEAR MEDICINE HEPATOBILIARY IMAGING TECHNIQUE: Sequential images of the abdomen were obtained out to  60 minutes following intravenous administration of radiopharmaceutical. RADIOPHARMACEUTICALS:  5.5 mCi Tc-22m  Choletec IV COMPARISON:  CT 10/26/2020 and abdominal sonogram from 10/26/2020 FINDINGS: Prompt uptake and biliary excretion of activity by the liver is seen. Biliary activity passes into small bowel, consistent with patent common bile duct. No extravasation of the radiopharmaceutical to indicate active bowel leak. IMPRESSION: 1. Patent common bile duct without extravasation of the radiopharmaceutical to indicate a bile leak. Electronically Signed   By: Signa Kell M.D.   On:  10/27/2020 11:44   US Abdomen Limited RUQ (LIVER/GB)  Result Date: 10/26/2020 CLINICAL DATA:  Abdominal pain. Four days status post cholecystectomy. EXAM: ULTRASOUND ABDOMEN LIMITED RIGHT UPPER QUADRANT COMPARISON:  None. FINDINGS: Gallbladder: Surgically absent. A complex postop fluid collection is seen within the gallbladder fossa which measures approximately 7.9 x 3.2 cm. Common bile duct: Diameter: 7 mm, within normal limits post cholecystectomy. Liver: No focal lesion identified. Within normal limits in parenchymal echogenicity. Portal vein is patent on color Doppler imaging with normal direction of blood flow towards the liver. Other: None. IMPRESSION: 7.9 x 3.2 cm complex postop fluid collection within the gallbladder fossa. No evidence of biliary ductal dilatation. Electronically Signed   By: Danae Orleans M.D.   On: 10/26/2020 12:51   US Abdomen Limited RUQ (LIVER/GB)  Result Date: 10/22/2020 CLINICAL DATA:  29 year old female with right upper quadrant pain. Nausea vomiting today. Abnormal LFTs. EXAM: ULTRASOUND ABDOMEN LIMITED RIGHT UPPER QUADRANT COMPARISON:  CT Abdomen and Pelvis 2346 hours yesterday. FINDINGS: Gallbladder: Shadowing gallstones (image 4) individually estimated at up to 15 mm diameter. Partially contracted gallbladder, with wall thickening up to 5 mm. No pericholecystic fluid, but positive sonographic Murphy sign elicited. Common bile duct: Diameter: 7 mm, mildly dilated. No filling defect identified within the visible duct (images 17 through 19). Liver: Liver echogenicity within normal limits. Central intrahepatic biliary ductal dilatation better demonstrated by CT. No discrete liver lesion. Portal vein is patent on color Doppler imaging with normal direction of blood flow towards the liver. Other: Negative visible right kidney. IMPRESSION: 1. Gallstones, gallbladder wall thickening, biliary ductal dilatation (as seen by CT) and positive sonographic Murphy sign. 2. This  constellation is suspicious for Choledocholithiasis with possible superimposed Acute Cholecystitis. Electronically Signed   By: Odessa Fleming M.D.   On: 10/22/2020 08:24     CBC Recent Labs  Lab 10/21/20 2150 10/22/20 0353 10/22/20 0936 10/25/20 2353 10/27/20 0619  WBC 4.9 4.4 3.8* 18.9* 12.5*  HGB 11.7* 11.7* 11.6* 12.4 10.5*  HCT 36.9 36.3 36.4 38.9 32.8*  PLT 227 222 213 280 237  MCV 84.1 84.0 84.5 84.4 83.9  MCH 26.7 27.1 26.9 26.9 26.9  MCHC 31.7 32.2 31.9 31.9 32.0  RDW 13.2 13.2 13.3 13.7 13.7    Chemistries  Recent Labs  Lab 10/21/20 2150 10/21/20 2150 10/22/20 0353 10/23/20 0812 10/25/20 2353 10/26/20 0647 10/27/20 0619  NA 135  --  136 134* 131*  --  134*  K 3.6  --  3.6 3.6 2.9*  --  2.8*  CL 105  --  105 103 95*  --  97*  CO2 24  --  24 23 24   --  28  GLUCOSE 107*  --  99 118* 104*  --  110*  BUN 8  --  7 8 6   --  7  CREATININE 0.89   < > 0.82 0.79 0.97 0.79 0.78  CALCIUM 8.7*  --  8.6* 8.3* 8.3*  --  8.1*  MG  --   --  2.1  --   --  2.2 2.5*  AST 330*  --  305* 204* 27  --  17  ALT 339*  --  367* 372* 143*  --  75*  ALKPHOS 101  --  110 116 124  --  97  BILITOT 3.1*  --  3.9* 3.5* 2.7*  --  1.4*   < > = values in this interval not displayed.   ------------------------------------------------------------------------------------------------------------------ No results for input(s): CHOL, HDL, LDLCALC, TRIG, CHOLHDL, LDLDIRECT in the last 72 hours.  Lab Results  Component Value Date   HGBA1C 5.4 04/16/2019   ------------------------------------------------------------------------------------------------------------------ No results for input(s): TSH, T4TOTAL, T3FREE, THYROIDAB in the last 72 hours.  Invalid input(s): FREET3 ------------------------------------------------------------------------------------------------------------------ No results for input(s): VITAMINB12, FOLATE, FERRITIN, TIBC, IRON, RETICCTPCT in the last 72 hours.  Coagulation  profile Recent Labs  Lab 10/22/20 0353 10/27/20 0619  INR 1.1 1.1    No results for input(s): DDIMER in the last 72 hours.  Cardiac Enzymes No results for input(s): CKMB, TROPONINI, MYOGLOBIN in the last 168 hours.  Invalid input(s): CK ------------------------------------------------------------------------------------------------------------------ No results found for: BNP   Shon Hale M.D on 10/27/2020 at 5:09 PM  Go to [email or web address] - for contact info  [facility] - Office  [phone]

## 2020-10-27 NOTE — Progress Notes (Signed)
   10/27/20 1438  Assess: MEWS Score  Temp 98.8 F (37.1 C)  BP (!) 135/94  Pulse Rate (!) 118  Resp 19  SpO2 98 %  Assess: MEWS Score  MEWS Temp 0  MEWS Systolic 0  MEWS Pulse 2  MEWS RR 0  MEWS LOC 0  MEWS Score 2  MEWS Score Color Yellow  Assess: if the MEWS score is Yellow or Red  Were vital signs taken at a resting state? Yes  Focused Assessment No change from prior assessment  Early Detection of Sepsis Score *See Row Information* Low  MEWS guidelines implemented *See Row Information* Yes  Take Vital Signs  Increase Vital Sign Frequency  Yellow: Q 2hr X 2 then Q 4hr X 2, if remains yellow, continue Q 4hrs  Escalate  MEWS: Escalate Yellow: discuss with charge nurse/RN and consider discussing with provider and RRT  Notify: Charge Nurse/RN  Name of Charge Nurse/RN Notified [name], [place]  Date Charge Nurse/RN Notified 10/27/20  Time Charge Nurse/RN Notified 1526  Notify: Provider  Provider Name/Title [facility]  Date Provider Notified 10/27/20  Notification Type Face-to-face (not new for patient)  Response No new orders

## 2020-10-27 NOTE — Progress Notes (Signed)
[facility]  Patient back in post op from lap chole 11/27. Looks like CT has normal post op appearance with Surgical SNOW in fossa. HIDA without leak and no biliary dilation. Acute pancreatitis on CT and lipase up.   Possible she did pass a stone at some point and have some pancreatitis (was being worked up for choledocholithiasis prior to OR).  No abscess or intraabdominal collection noted.   NPO ok for ice. Discussed with patient and RN.  Discussed with Dr. Mariea Clonts.  Algis Greenhouse, MD [facility] [address] [name], [place] [address] [phone] (office)

## 2020-10-28 DIAGNOSIS — Z6841 Body Mass Index (BMI) 40.0 and over, adult: Secondary | ICD-10-CM | POA: Diagnosis not present

## 2020-10-28 DIAGNOSIS — A419 Sepsis, unspecified organism: Secondary | ICD-10-CM | POA: Diagnosis not present

## 2020-10-28 DIAGNOSIS — J189 Pneumonia, unspecified organism: Secondary | ICD-10-CM

## 2020-10-28 DIAGNOSIS — R6520 Severe sepsis without septic shock: Secondary | ICD-10-CM

## 2020-10-28 DIAGNOSIS — J9600 Acute respiratory failure, unspecified whether with hypoxia or hypercapnia: Secondary | ICD-10-CM

## 2020-10-28 DIAGNOSIS — K859 Acute pancreatitis without necrosis or infection, unspecified: Secondary | ICD-10-CM | POA: Diagnosis not present

## 2020-10-28 DIAGNOSIS — R109 Unspecified abdominal pain: Secondary | ICD-10-CM | POA: Diagnosis not present

## 2020-10-28 LAB — BASIC METABOLIC PANEL
Anion gap: 9 (ref 5–15)
BUN: 5 mg/dL — ABNORMAL LOW (ref 6–20)
CO2: 26 mmol/L (ref 22–32)
Calcium: 8.4 mg/dL — ABNORMAL LOW (ref 8.9–10.3)
Chloride: 99 mmol/L (ref 98–111)
Creatinine, Ser: 0.79 mg/dL (ref 0.44–1.00)
GFR, Estimated: >60 mL/min (ref >60)
Glucose, Bld: 114 mg/dL — ABNORMAL HIGH (ref 70–99)
Potassium: 3.4 mmol/L — ABNORMAL LOW (ref 3.5–5.1)
Sodium: 134 mmol/L — ABNORMAL LOW (ref 135–145)

## 2020-10-28 LAB — CBC
HCT: 33.8 % — ABNORMAL LOW (ref 36.0–46.0)
Hemoglobin: 10.5 g/dL — ABNORMAL LOW (ref 12.0–15.0)
MCH: 26.3 pg (ref 26.0–34.0)
MCHC: 31.1 g/dL (ref 30.0–36.0)
MCV: 84.7 fL (ref 80.0–100.0)
Platelets: 260 10*3/uL (ref 150–400)
RBC: 3.99 MIL/uL (ref 3.87–5.11)
RDW: 13.8 % (ref 11.5–15.5)
WBC: 11.9 10*3/uL — ABNORMAL HIGH (ref 4.0–10.5)
nRBC: 0 % (ref 0.0–0.2)

## 2020-10-28 MED ORDER — POTASSIUM CHLORIDE CRYS ER 20 MEQ PO TBCR
40.0000 meq | EXTENDED_RELEASE_TABLET | ORAL | Status: AC
  Administered 2020-10-28 (×2): 40 meq via ORAL
  Filled 2020-10-28 (×2): qty 2

## 2020-10-28 MED ORDER — POLYETHYLENE GLYCOL 3350 17 G PO PACK
17.0000 g | PACK | Freq: Every day | ORAL | Status: DC
  Filled 2020-10-28 (×2): qty 1

## 2020-10-28 MED ORDER — SODIUM CHLORIDE 0.9 % IV SOLN: INTRAVENOUS | Status: AC

## 2020-10-28 MED ORDER — LACTULOSE 10 GM/15ML PO SOLN
30.0000 g | Freq: Once | ORAL | Status: AC
  Administered 2020-10-28: 30 g via ORAL
  Filled 2020-10-28: qty 60

## 2020-10-28 MED ORDER — SENNOSIDES-DOCUSATE SODIUM 8.6-50 MG PO TABS
2.0000 | ORAL_TABLET | Freq: Two times a day (BID) | ORAL | Status: DC
  Administered 2020-10-28 – 2020-10-29 (×3): 2 via ORAL
  Filled 2020-10-28 (×4): qty 2

## 2020-10-28 NOTE — Progress Notes (Signed)
[facility]  Doing better. Trying some liquids. Pain improved. Did have fever to 102 yesterday. Could have some transient bacteremia from passing a stone. Cultures sent. PNA treatment.  Agree with adv diet and hopefully home in next 24 hours.  Appreciate Dr. Fredirick Lathe expertise.   Patricia Greenhouse, MD [facility] [address] [name], [place] [address] [phone] (office)

## 2020-10-28 NOTE — Progress Notes (Addendum)
Patient Demographics:    Patricia [name], is a 29 y.o. female, DOB - [birth year], ONG:[id number]  Admit date - 10/25/2020   Admitting Physician Bernadette Hoit, DO  Outpatient Primary MD for the patient is Ralph Leyden, FNP  LOS - 2   Chief Complaint  Patient presents with  . Chest Pain        Subjective:    [name] today has no fevers, no emesis,  No chest pain,   -Nausea and abdominal pain improving -No BM in about 1 week  Assessment  & Plan :    Principal Problem:   Sepsis due to PNA Active Problems:   Acute Gallstone Pancreatitis   Morbid obesity with BMI of 50.0-59.9, adult (HCC)   Abdominal pain   Atypical chest pain   High total bilirubin   Pneumonia  Brief Summary- 29 year old with past medical history relevant for morbid obesity, depression/anxiety and cholelithiasis with recent lap chole on 10/23/2020  -Readmitted on 10/25/2020 with work-up so far suggestive of acute gallstone pancreatitis and sepsis secondary to pneumonia most likely aspiration related in the postop patient  A/p 1) acute gallstone pancreatitis--patient had cholelithiasis recently had lap chole on 10/23/2020 -Abdominal ultrasound, CT abdomen and pelvis as well as HIDA scan noted -No evidence of biliary leak or postop abscess -Discussed with general surgeon Dr. Constance Haw -Medical management for pancreatitis -Patient was n.p.o., will try liquid diet at this time -Continue IV fluids PPI and as needed antiemetics and pain medications  2)Morbid Obesity-this complicates overall care and input decreases mortality morbidity -after resolution of acute pancreatitis patient will be advised to adhere to low calorie diet, portion control and increase physical activity discussed with patient -Body mass index is 54.19 kg/m.  3) sepsis secondary to PNA in a postop patient--concern will be for possible aspiration  pneumonia--no hypoxia,  -Patient met sepsis criteria with fevers above 102, tachycardia and leukocytosis in the setting of pneumonia --continue IV Zosyn, anticipate discharge on p.o. Augmentin possibly in 1 to 2 days  4) constipation--laxatives as ordered  5) hyponatremia/hypokalemia--- replace electrolytes, give IV fluids  Disposition/Need for in-Hospital Stay- patient unable to be discharged at this time due to --- acute pancreatitis requiring n.p.o. status IV fluids and IV antiemetics and IV morphine sulfate for pain control, pneumonia requiring IV antibiotics  Status is: Inpatient  Remains inpatient appropriate because:acute pancreatitis requiring n.p.o. status IV fluids and IV antiemetics and IV morphine sulfate for pain control, pneumonia requiring IV antibiotics   Disposition: The patient is from: Home              Anticipated d/c is to: Home              Anticipated d/c date is: 1 day              Patient currently is not medically stable to d/c. Barriers: Not Clinically Stable- acute pancreatitis requiring n.p.o. status IV fluids and IV antiemetics and IV morphine sulfate for pain control, pneumonia requiring IV antibiotics   Code Status : full   Family Communication:    NA (patient is alert, awake and coherent)  Consults  :  gen surgery  DVT Prophylaxis  :  Lovenox -  - SCDs *   Lab  Results  Component Value Date   PLT 260 10/28/2020    Inpatient Medications  Scheduled Meds: . dextromethorphan-guaiFENesin  1 tablet Oral BID  . enoxaparin (LOVENOX) injection  80 mg Subcutaneous Q24H  . lactulose  30 g Oral Once  . lidocaine  1 patch Transdermal Once  . pantoprazole (PROTONIX) IV  40 mg Intravenous Q24H  . [START ON 10/29/2020] polyethylene glycol  17 g Oral Daily  . potassium chloride  40 mEq Oral Q3H  . senna-docusate  2 tablet Oral BID   Continuous Infusions: . dextrose 5 % and 0.45 % NaCl with KCl 20 mEq/L 125 mL/hr at 10/27/20 1323  .  piperacillin-tazobactam (ZOSYN)  IV 3.375 g (10/28/20 6962)   PRN Meds:.acetaminophen, morphine injection    Anti-infectives (From admission, onward)   Start     Dose/Rate Route Frequency Ordered Stop   10/27/20 0015  cefTRIAXone (ROCEPHIN) 2 g in sodium chloride 0.9 % 100 mL IVPB  Status:  Discontinued        2 g 200 mL/hr over 30 Minutes Intravenous Every 24 hours 10/26/20 0443 10/26/20 1842   10/27/20 0015  azithromycin (ZITHROMAX) 500 mg in sodium chloride 0.9 % 250 mL IVPB  Status:  Discontinued        500 mg 250 mL/hr over 60 Minutes Intravenous Every 24 hours 10/26/20 0443 10/26/20 1842   10/26/20 1945  piperacillin-tazobactam (ZOSYN) IVPB 3.375 g        3.375 g 12.5 mL/hr over 240 Minutes Intravenous Every 8 hours 10/26/20 1853     10/26/20 0400  cefTRIAXone (ROCEPHIN) 1 g in sodium chloride 0.9 % 100 mL IVPB        1 g 200 mL/hr over 30 Minutes Intravenous  Once 10/26/20 0357 10/26/20 0504   10/26/20 0400  azithromycin (ZITHROMAX) 500 mg in sodium chloride 0.9 % 250 mL IVPB        500 mg 250 mL/hr over 60 Minutes Intravenous  Once 10/26/20 0357 10/26/20 0642        Objective:   Vitals:   10/27/20 1725 10/27/20 1847 10/27/20 2050 10/28/20 0535  BP: 112/66 121/72 (!) 152/97 (!) 103/56  Pulse: (!) 118 (!) 109 (!) 104 (!) 103  Resp: 16 16 16 17   Temp: (!) 102.2 F (39 C) (!) 100.6 F (38.1 C) 99.3 F (37.4 C) 98.1 F (36.7 C)  TempSrc: Oral Oral Oral   SpO2: 94% 96% 98% 96%  Weight:      Height:        Wt Readings from Last 3 Encounters:  10/25/20 (!) 156.9 kg  10/22/20 (!) 156 kg  11/05/19 (!) 147.9 kg     Intake/Output Summary (Last 24 hours) at 10/28/2020 1147 Last data filed at 10/27/2020 1500 Gross per 24 hour  Intake 550 ml  Output --  Net 550 ml    Physical Exam  Gen:- Awake Alert, morbidly obese HEENT:- [place].AT, No sclera icterus Neck-Supple Neck,No JVD,.  Lungs-diminished breath sounds, no wheezing  CV- S1, S2 normal, regular  Abd-  +ve  B.Sounds, Abd Soft, epigastric and  upper quadrant area tenderness without rebound or guarding, increased truncal adiposity  extremity/Skin:- No  edema, pedal pulses present  Psych-affect is appropriate, oriented x3 Neuro-no new focal deficits, no tremors   Data Review:   Micro Results Recent Results (from the past 240 hour(s))  Resp Panel by RT-PCR (Flu A&B, Covid) Nasopharyngeal Swab     Status: None   Collection Time: 10/22/20  1:20 AM  Specimen: Nasopharyngeal Swab; Nasopharyngeal(NP) swabs in vial transport medium  Result Value Ref Range Status   SARS Coronavirus 2 by RT PCR NEGATIVE NEGATIVE Final    Comment: (NOTE) SARS-CoV-2 target nucleic acids are NOT DETECTED.  The SARS-CoV-2 RNA is generally detectable in upper respiratory specimens during the acute phase of infection. The lowest concentration of SARS-CoV-2 viral copies this assay can detect is 138 copies/mL. A negative result does not preclude SARS-Cov-2 infection and should not be used as the sole basis for treatment or other patient management decisions. A negative result may occur with  improper specimen collection/handling, submission of specimen other than nasopharyngeal swab, presence of viral mutation(s) within the areas targeted by this assay, and inadequate number of viral copies(<138 copies/mL). A negative result must be combined with clinical observations, patient history, and epidemiological information. The expected result is Negative.  Fact Sheet for Patients:  EntrepreneurPulse.com.au  Fact Sheet for Healthcare Providers:  IncredibleEmployment.be  This test is no t yet approved or cleared by the [place] FDA and  has been authorized for detection and/or diagnosis of SARS-CoV-2 by FDA under an Emergency Use Authorization (EUA). This EUA will remain  in effect (meaning this test can be used) for the duration of the COVID-19 declaration under Section 564(b)(1)  of the Act, 21 U.S.C.section 360bbb-3(b)(1), unless the authorization is terminated  or revoked sooner.       Influenza A by PCR NEGATIVE NEGATIVE Final   Influenza B by PCR NEGATIVE NEGATIVE Final    Comment: (NOTE) The Xpert Xpress SARS-CoV-2/FLU/RSV plus assay is intended as an aid in the diagnosis of influenza from Nasopharyngeal swab specimens and should not be used as a sole basis for treatment. Nasal washings and aspirates are unacceptable for Xpert Xpress SARS-CoV-2/FLU/RSV testing.  Fact Sheet for Patients: EntrepreneurPulse.com.au  Fact Sheet for Healthcare Providers: IncredibleEmployment.be  This test is not yet approved or cleared by the [place] FDA and has been authorized for detection and/or diagnosis of SARS-CoV-2 by FDA under an Emergency Use Authorization (EUA). This EUA will remain in effect (meaning this test can be used) for the duration of the COVID-19 declaration under Section 564(b)(1) of the Act, 21 U.S.C. section 360bbb-3(b)(1), unless the authorization is terminated or revoked.  Performed at [facility], [address], [place], [place] [address]   Resp Panel by RT-PCR (Flu A&B, Covid) Nasopharyngeal Swab     Status: None   Collection Time: 10/26/20  4:15 AM   Specimen: Nasopharyngeal Swab; Nasopharyngeal(NP) swabs in vial transport medium  Result Value Ref Range Status   SARS Coronavirus 2 by RT PCR NEGATIVE NEGATIVE Final    Comment: (NOTE) SARS-CoV-2 target nucleic acids are NOT DETECTED.  The SARS-CoV-2 RNA is generally detectable in upper respiratory specimens during the acute phase of infection. The lowest concentration of SARS-CoV-2 viral copies this assay can detect is 138 copies/mL. A negative result does not preclude SARS-Cov-2 infection and should not be used as the sole basis for treatment or other patient management decisions. A negative result may occur with  improper specimen  collection/handling, submission of specimen other than nasopharyngeal swab, presence of viral mutation(s) within the areas targeted by this assay, and inadequate number of viral copies(<138 copies/mL). A negative result must be combined with clinical observations, patient history, and epidemiological information. The expected result is Negative.  Fact Sheet for Patients:  EntrepreneurPulse.com.au  Fact Sheet for Healthcare Providers:  IncredibleEmployment.be  This test is no t yet approved or cleared by the [place]  States FDA and  has been authorized for detection and/or diagnosis of SARS-CoV-2 by FDA under an Emergency Use Authorization (EUA). This EUA will remain  in effect (meaning this test can be used) for the duration of the COVID-19 declaration under Section 564(b)(1) of the Act, 21 U.S.C.section 360bbb-3(b)(1), unless the authorization is terminated  or revoked sooner.       Influenza A by PCR NEGATIVE NEGATIVE Final   Influenza B by PCR NEGATIVE NEGATIVE Final    Comment: (NOTE) The Xpert Xpress SARS-CoV-2/FLU/RSV plus assay is intended as an aid in the diagnosis of influenza from Nasopharyngeal swab specimens and should not be used as a sole basis for treatment. Nasal washings and aspirates are unacceptable for Xpert Xpress SARS-CoV-2/FLU/RSV testing.  Fact Sheet for Patients: EntrepreneurPulse.com.au  Fact Sheet for Healthcare Providers: IncredibleEmployment.be  This test is not yet approved or cleared by the [place] FDA and has been authorized for detection and/or diagnosis of SARS-CoV-2 by FDA under an Emergency Use Authorization (EUA). This EUA will remain in effect (meaning this test can be used) for the duration of the COVID-19 declaration under Section 564(b)(1) of the Act, 21 U.S.C. section 360bbb-3(b)(1), unless the authorization is terminated or revoked.  Performed at [facility], [address] [place], [place] [address]   Culture, blood (routine x 2) Call MD if unable to obtain prior to antibiotics being given     Status: None (Preliminary result)   Collection Time: 10/26/20  6:47 AM   Specimen: BLOOD  Result Value Ref Range Status   Specimen Description BLOOD RIGHT ANTECUBITAL  Final   Special Requests   Final    BOTTLES DRAWN AEROBIC AND ANAEROBIC Blood Culture results may not be optimal due to an inadequate volume of blood received in culture bottles   Culture   Final    NO GROWTH 2 DAYS Performed at [facility], [address] [place], [place] [address]    Report Status PENDING  Incomplete  Culture, blood (routine x 2) Call MD if unable to obtain prior to antibiotics being given     Status: None (Preliminary result)   Collection Time: 10/26/20  6:47 AM   Specimen: BLOOD  Result Value Ref Range Status   Specimen Description BLOOD LEFT ANTECUBITAL  Final   Special Requests   Final    BOTTLES DRAWN AEROBIC AND ANAEROBIC Blood Culture adequate volume   Culture   Final    NO GROWTH 2 DAYS Performed at [facility], [address] [place], [place] [address]    Report Status PENDING  Incomplete  Culture, blood (Routine X 2) w Reflex to ID Panel     Status: None (Preliminary result)   Collection Time: 10/27/20 11:09 PM   Specimen: BLOOD LEFT HAND  Result Value Ref Range Status   Specimen Description BLOOD LEFT HAND  Final   Special Requests   Final    BOTTLES DRAWN AEROBIC AND ANAEROBIC Blood Culture adequate volume   Culture   Final    NO GROWTH < 12 HOURS Performed at [facility], [address], [place], [place] [address]    Report Status PENDING  Incomplete  Culture, blood (Routine X 2) w Reflex to ID Panel     Status: None (Preliminary result)   Collection Time: 10/27/20 11:14 PM   Specimen: BLOOD LEFT HAND  Result Value Ref Range Status   Specimen Description BLOOD LEFT HAND  Final   Special Requests   Final    BOTTLES  DRAWN AEROBIC AND  ANAEROBIC Blood Culture adequate volume   Culture   Final    NO GROWTH < 12 HOURS Performed at [facility], [address] [place], [place] [address]    Report Status PENDING  Incomplete    Radiology Reports DG Chest 2 View  Result Date: 10/25/2020 CLINICAL DATA:  Chest pain status post gallbladder surgery on Friday EXAM: CHEST - 2 VIEW COMPARISON:  Chest radiograph October 21, 2020. FINDINGS: The heart size and mediastinal contours are within normal limits. Low lung volumes with hypoventilatory change in the dependent lungs. No visible pneumothorax or significant pleural effusion. Cholecystectomy clips. IMPRESSION: Low lung volumes with bilateral lower lobe and right middle lobe atelectasis, superimposed infection cannot be exclude. Electronically Signed   By: Dahlia Bailiff MD   On: 10/25/2020 17:37   DG Chest 2 View  Result Date: 10/21/2020 CLINICAL DATA:  Chest and abdominal pain EXAM: CHEST - 2 VIEW COMPARISON:  06/10/2018 FINDINGS: The heart size and mediastinal contours are within normal limits. Both lungs are clear. The visualized skeletal structures are unremarkable. IMPRESSION: No active cardiopulmonary disease. Electronically Signed   By: Donavan Foil M.D.   On: 10/21/2020 21:12   CT Angio Chest PE W/Cm &/Or Wo Cm  Result Date: 10/26/2020 CLINICAL DATA:  History of prior cholecystectomy 4 days ago with abdominal pain and distension with shortness of breath EXAM: CT ANGIOGRAPHY CHEST CT ABDOMEN AND PELVIS WITH CONTRAST TECHNIQUE: Multidetector CT imaging of the chest was performed using the standard protocol during bolus administration of intravenous contrast. Multiplanar CT image reconstructions and MIPs were obtained to evaluate the vascular anatomy. Multidetector CT imaging of the abdomen and pelvis was performed using the standard protocol during bolus administration of intravenous contrast. CONTRAST:  112m OMNIPAQUE IOHEXOL 350 MG/ML SOLN COMPARISON:  Chest x-ray from the  previous day, CT 10/21/2020. FINDINGS: CTA CHEST FINDINGS Cardiovascular: Thoracic aorta and its branches are within normal limits. No aneurysmal dilatation or dissection is noted. No significant cardiomegaly is seen. Significant motion artifact is noted limiting evaluation of the pulmonary artery although no large central pulmonary embolus is seen. Mediastinum/Nodes: Thoracic inlet is within normal limits. No sizable hilar or mediastinal adenopathy is seen. The esophagus as visualized is within normal limits. Lungs/Pleura: Small bilateral pleural effusions are noted with bilateral lower lobe consolidation left greater than right. No sizable parenchymal nodule is seen. Musculoskeletal: No chest wall abnormality. No acute or significant osseous findings. Review of the MIP images confirms the above findings. CT ABDOMEN and PELVIS FINDINGS Hepatobiliary: Changes consistent with recent cholecystectomy. Minimal air in fluid are noted in the gallbladder fossa which may simply be related to the prior surgery. Possibility biliary leak could not be totally excluded although no significant free fluid is noted within the abdomen. Pancreas: Considerable peripancreatic inflammatory changes are noted without evidence of pancreatic necrosis. These changes suggest acute pancreatitis. Correlation with lipase values is recommended. Spleen: Normal in size without focal abnormality. Adrenals/Urinary Tract: Adrenal glands are within normal limits. Kidneys demonstrate a normal enhancement pattern. No renal calculi or obstructive changes are seen. The bladder is well distended. Stomach/Bowel: The appendix is within normal limits. No obstructive or inflammatory changes of the colon are seen. Small bowel is within normal limits. Stomach demonstrates some mild wall thickening likely reactive to the adjacent pancreas changes. Vascular/Lymphatic: No significant vascular findings are present. No enlarged abdominal or pelvic lymph nodes.  Reproductive: Uterus and bilateral adnexa are unremarkable. Other: No abdominal wall hernia or abnormality. No  abdominopelvic ascites. Postsurgical changes are noted consistent with the recent laparoscopic cholecystectomy. Small fat containing periumbilical hernia is noted. This is stable from the prior exam. Musculoskeletal: No acute or significant osseous findings. Review of the MIP images confirms the above findings. IMPRESSION: CTA of the chest: No pulmonary emboli are noted. Small bilateral pleural effusions with lower lobe consolidation left greater than right. CT of the abdomen and pelvis: Changes consistent with the prior cholecystectomy with a small amount of air in fluid in the gallbladder fossa likely related to the postoperative state. No significant intra-abdominal fluid to suggest bile leak is noted at this time. Changes consistent with acute pancreatitis without evidence of pancreatic necrosis. Correlation with lipase levels is recommended. Electronically Signed   By: Inez Catalina M.D.   On: 10/26/2020 03:29   CT Abdomen Pelvis W Contrast  Result Date: 10/26/2020 CLINICAL DATA:  History of prior cholecystectomy 4 days ago with abdominal pain and distension with shortness of breath EXAM: CT ANGIOGRAPHY CHEST CT ABDOMEN AND PELVIS WITH CONTRAST TECHNIQUE: Multidetector CT imaging of the chest was performed using the standard protocol during bolus administration of intravenous contrast. Multiplanar CT image reconstructions and MIPs were obtained to evaluate the vascular anatomy. Multidetector CT imaging of the abdomen and pelvis was performed using the standard protocol during bolus administration of intravenous contrast. CONTRAST:  11m OMNIPAQUE IOHEXOL 350 MG/ML SOLN COMPARISON:  Chest x-ray from the previous day, CT 10/21/2020. FINDINGS: CTA CHEST FINDINGS Cardiovascular: Thoracic aorta and its branches are within normal limits. No aneurysmal dilatation or dissection is noted. No significant  cardiomegaly is seen. Significant motion artifact is noted limiting evaluation of the pulmonary artery although no large central pulmonary embolus is seen. Mediastinum/Nodes: Thoracic inlet is within normal limits. No sizable hilar or mediastinal adenopathy is seen. The esophagus as visualized is within normal limits. Lungs/Pleura: Small bilateral pleural effusions are noted with bilateral lower lobe consolidation left greater than right. No sizable parenchymal nodule is seen. Musculoskeletal: No chest wall abnormality. No acute or significant osseous findings. Review of the MIP images confirms the above findings. CT ABDOMEN and PELVIS FINDINGS Hepatobiliary: Changes consistent with recent cholecystectomy. Minimal air in fluid are noted in the gallbladder fossa which may simply be related to the prior surgery. Possibility biliary leak could not be totally excluded although no significant free fluid is noted within the abdomen. Pancreas: Considerable peripancreatic inflammatory changes are noted without evidence of pancreatic necrosis. These changes suggest acute pancreatitis. Correlation with lipase values is recommended. Spleen: Normal in size without focal abnormality. Adrenals/Urinary Tract: Adrenal glands are within normal limits. Kidneys demonstrate a normal enhancement pattern. No renal calculi or obstructive changes are seen. The bladder is well distended. Stomach/Bowel: The appendix is within normal limits. No obstructive or inflammatory changes of the colon are seen. Small bowel is within normal limits. Stomach demonstrates some mild wall thickening likely reactive to the adjacent pancreas changes. Vascular/Lymphatic: No significant vascular findings are present. No enlarged abdominal or pelvic lymph nodes. Reproductive: Uterus and bilateral adnexa are unremarkable. Other: No abdominal wall hernia or abnormality. No abdominopelvic ascites. Postsurgical changes are noted consistent with the recent  laparoscopic cholecystectomy. Small fat containing periumbilical hernia is noted. This is stable from the prior exam. Musculoskeletal: No acute or significant osseous findings. Review of the MIP images confirms the above findings. IMPRESSION: CTA of the chest: No pulmonary emboli are noted. Small bilateral pleural effusions with lower lobe consolidation left greater than right. CT of the abdomen and pelvis: Changes  consistent with the prior cholecystectomy with a small amount of air in fluid in the gallbladder fossa likely related to the postoperative state. No significant intra-abdominal fluid to suggest bile leak is noted at this time. Changes consistent with acute pancreatitis without evidence of pancreatic necrosis. Correlation with lipase levels is recommended. Electronically Signed   By: Inez Catalina M.D.   On: 10/26/2020 03:29   CT ABDOMEN PELVIS W CONTRAST  Result Date: 10/22/2020 CLINICAL DATA:  Abdominal pain. Suspected biliary obstruction. Right-sided abdominal pain with nausea and vomiting. EXAM: CT ABDOMEN AND PELVIS WITH CONTRAST TECHNIQUE: Multidetector CT imaging of the abdomen and pelvis was performed using the standard protocol following bolus administration of intravenous contrast. CONTRAST:  122m OMNIPAQUE IOHEXOL 300 MG/ML  SOLN COMPARISON:  CT chest 06/10/2018.  CT abdomen and pelvis 06/03/2012 FINDINGS: Lower chest: The lung bases are clear. Hepatobiliary: Mild to moderate intra and extrahepatic bile duct dilatation. No obstructing stone or mass is identified. Suggest MRCP to exclude an occult lesion. Suggestion of vague attenuation change in the gallbladder neck probably representing gallstones. No gallbladder wall thickening or infiltration. No focal liver lesions. Pancreas: Unremarkable. No pancreatic ductal dilatation or surrounding inflammatory changes. Spleen: Normal in size without focal abnormality. Adrenals/Urinary Tract: Adrenal glands are unremarkable. Kidneys are normal,  without renal calculi, focal lesion, or hydronephrosis. Bladder is unremarkable. Stomach/Bowel: Stomach is within normal limits. Appendix appears normal. No evidence of bowel wall thickening, distention, or inflammatory changes. Vascular/Lymphatic: No significant vascular findings are present. No enlarged abdominal or pelvic lymph nodes. Reproductive: Uterus and bilateral adnexa are unremarkable. Other: No free air or free fluid in the abdomen. Several periumbilical hernias containing fat. Musculoskeletal: No acute or significant osseous findings. IMPRESSION: 1. Mild to moderate intra and extrahepatic bile duct dilatation. No obstructing stone or mass is identified. Probable cholelithiasis. Differential diagnosis may include non radiopaque common duct stones, occult mass, or inflammatory process/stricture. Suggest MRCP to exclude an occult lesion. 2. No evidence of bowel obstruction or inflammation. Normal appendix. 3. Several periumbilical hernias containing fat. Electronically Signed   By: WLucienne CapersM.D.   On: 10/22/2020 00:11   MR 3D Recon At Scanner  Result Date: 10/22/2020 CLINICAL DATA:  Abdominal pain.  Evaluate for biliary obstruction. EXAM: MRI ABDOMEN WITHOUT AND WITH CONTRAST (INCLUDING MRCP) TECHNIQUE: Multiplanar multisequence MR imaging of the abdomen was performed both before and after the administration of intravenous contrast. Heavily T2-weighted images of the biliary and pancreatic ducts were obtained, and three-dimensional MRCP images were rendered by post processing. CONTRAST:  136mGADAVIST GADOBUTROL 1 MMOL/ML IV SOLN COMPARISON:  Ultrasound 10/22/2020 FINDINGS: Lower chest: No acute findings. Hepatobiliary: Mild diffuse hepatic steatosis. Within the limitations of unenhanced technique there is no focal liver abnormality. The gallbladder wall appears mildly thickened measuring up to 4 mm. Stone within the proximal gallbladder measures 1.5 cm. No biliary ductal dilatation. The common  bile duct measures 5 mm in diameter. No signs of choledocholithiasis. Pancreas: No mass, inflammatory changes, or other parenchymal abnormality identified. Spleen:  Within normal limits in size and appearance. Adrenals/Urinary Tract: Normal appearance of the adrenal glands. The kidneys are unremarkable. Stomach/Bowel: Visualized portions within the abdomen are unremarkable. Vascular/Lymphatic: No pathologically enlarged lymph nodes identified. No abdominal aortic aneurysm demonstrated. Other:  No free fluid or fluid collections. Musculoskeletal: No suspicious bone lesions identified. IMPRESSION: 1. Gallstones with gallbladder wall thickening. Findings consistent with acute cholecystitis. 2. No biliary ductal dilatation or signs of choledocholithiasis. 3. Mild diffuse hepatic steatosis. Electronically Signed  By: Kerby Moors M.D.   On: 10/22/2020 10:06   MR ABDOMEN MRCP W WO CONTAST  Result Date: 10/22/2020 CLINICAL DATA:  Abdominal pain.  Evaluate for biliary obstruction. EXAM: MRI ABDOMEN WITHOUT AND WITH CONTRAST (INCLUDING MRCP) TECHNIQUE: Multiplanar multisequence MR imaging of the abdomen was performed both before and after the administration of intravenous contrast. Heavily T2-weighted images of the biliary and pancreatic ducts were obtained, and three-dimensional MRCP images were rendered by post processing. CONTRAST:  31m GADAVIST GADOBUTROL 1 MMOL/ML IV SOLN COMPARISON:  Ultrasound 10/22/2020 FINDINGS: Lower chest: No acute findings. Hepatobiliary: Mild diffuse hepatic steatosis. Within the limitations of unenhanced technique there is no focal liver abnormality. The gallbladder wall appears mildly thickened measuring up to 4 mm. Stone within the proximal gallbladder measures 1.5 cm. No biliary ductal dilatation. The common bile duct measures 5 mm in diameter. No signs of choledocholithiasis. Pancreas: No mass, inflammatory changes, or other parenchymal abnormality identified. Spleen:  Within  normal limits in size and appearance. Adrenals/Urinary Tract: Normal appearance of the adrenal glands. The kidneys are unremarkable. Stomach/Bowel: Visualized portions within the abdomen are unremarkable. Vascular/Lymphatic: No pathologically enlarged lymph nodes identified. No abdominal aortic aneurysm demonstrated. Other:  No free fluid or fluid collections. Musculoskeletal: No suspicious bone lesions identified. IMPRESSION: 1. Gallstones with gallbladder wall thickening. Findings consistent with acute cholecystitis. 2. No biliary ductal dilatation or signs of choledocholithiasis. 3. Mild diffuse hepatic steatosis. Electronically Signed   By: TKerby MoorsM.D.   On: 10/22/2020 10:06   NM HEPATO BILIARY LEAK  Result Date: 10/27/2020 CLINICAL DATA:  Upper abdominal pain status post cholecystectomy. EXAM: NUCLEAR MEDICINE HEPATOBILIARY IMAGING TECHNIQUE: Sequential images of the abdomen were obtained out to 60 minutes following intravenous administration of radiopharmaceutical. RADIOPHARMACEUTICALS:  5.5 mCi Tc-969mCholetec IV COMPARISON:  CT 10/26/2020 and abdominal sonogram from 10/26/2020 FINDINGS: Prompt uptake and biliary excretion of activity by the liver is seen. Biliary activity passes into small bowel, consistent with patent common bile duct. No extravasation of the radiopharmaceutical to indicate active bowel leak. IMPRESSION: 1. Patent common bile duct without extravasation of the radiopharmaceutical to indicate a bile leak. Electronically Signed   By: TaKerby Moors.D.   On: 10/27/2020 11:44   USKoreabdomen Limited RUQ (LIVER/GB)  Result Date: 10/26/2020 CLINICAL DATA:  Abdominal pain. Four days status post cholecystectomy. EXAM: ULTRASOUND ABDOMEN LIMITED RIGHT UPPER QUADRANT COMPARISON:  None. FINDINGS: Gallbladder: Surgically absent. A complex postop fluid collection is seen within the gallbladder fossa which measures approximately 7.9 x 3.2 cm. Common bile duct: Diameter: 7 mm, within  normal limits post cholecystectomy. Liver: No focal lesion identified. Within normal limits in parenchymal echogenicity. Portal vein is patent on color Doppler imaging with normal direction of blood flow towards the liver. Other: None. IMPRESSION: 7.9 x 3.2 cm complex postop fluid collection within the gallbladder fossa. No evidence of biliary ductal dilatation. Electronically Signed   By: JoMarlaine Hind.D.   On: 10/26/2020 12:51   USKoreabdomen Limited RUQ (LIVER/GB)  Result Date: 10/22/2020 CLINICAL DATA:  [age]ear old female with right upper quadrant pain. Nausea vomiting today. Abnormal LFTs. EXAM: ULTRASOUND ABDOMEN LIMITED RIGHT UPPER QUADRANT COMPARISON:  CT Abdomen and Pelvis 2346 hours yesterday. FINDINGS: Gallbladder: Shadowing gallstones (image 4) individually estimated at up to 15 mm diameter. Partially contracted gallbladder, with wall thickening up to 5 mm. No pericholecystic fluid, but positive sonographic Murphy sign elicited. Common bile duct: Diameter: 7 mm, mildly dilated. No filling defect identified within the visible duct (  images 17 through 19). Liver: Liver echogenicity within normal limits. Central intrahepatic biliary ductal dilatation better demonstrated by CT. No discrete liver lesion. Portal vein is patent on color Doppler imaging with normal direction of blood flow towards the liver. Other: Negative visible right kidney. IMPRESSION: 1. Gallstones, gallbladder wall thickening, biliary ductal dilatation (as seen by CT) and positive sonographic Murphy sign. 2. This constellation is suspicious for Choledocholithiasis with possible superimposed Acute Cholecystitis. Electronically Signed   By: Genevie Ann M.D.   On: 10/22/2020 08:24     CBC Recent Labs  Lab 10/22/20 0353 10/22/20 0936 10/25/20 2353 10/27/20 0619 10/28/20 0933  WBC 4.4 3.8* 18.9* 12.5* 11.9*  HGB 11.7* 11.6* 12.4 10.5* 10.5*  HCT 36.3 36.4 38.9 32.8* 33.8*  PLT 222 213 280 237 260  MCV 84.0 84.5 84.4 83.9 84.7  MCH  27.1 26.9 26.9 26.9 26.3  MCHC 32.2 31.9 31.9 32.0 31.1  RDW 13.2 13.3 13.7 13.7 13.8    Chemistries  Recent Labs  Lab 10/21/20 2150 10/21/20 2150 10/22/20 0353 10/22/20 0353 10/23/20 0812 10/25/20 2353 10/26/20 0647 10/27/20 0619 10/28/20 0933  NA 135   < > 136  --  134* 131*  --  134* 134*  K 3.6   < > 3.6  --  3.6 2.9*  --  2.8* 3.4*  CL 105   < > 105  --  103 95*  --  97* 99  CO2 24   < > 24  --  23 24  --  28 26  GLUCOSE 107*   < > 99  --  118* 104*  --  110* 114*  BUN 8   < > 7  --  8 6  --  7 5*  CREATININE 0.89   < > 0.82   < > 0.79 0.97 0.79 0.78 0.79  CALCIUM 8.7*   < > 8.6*  --  8.3* 8.3*  --  8.1* 8.4*  MG  --   --  2.1  --   --   --  2.2 2.5*  --   AST 330*  --  305*  --  204* 27  --  17  --   ALT 339*  --  367*  --  372* 143*  --  75*  --   ALKPHOS 101  --  110  --  116 124  --  97  --   BILITOT 3.1*  --  3.9*  --  3.5* 2.7*  --  1.4*  --    < > = values in this interval not displayed.   ------------------------------------------------------------------------------------------------------------------ No results for input(s): CHOL, HDL, LDLCALC, TRIG, CHOLHDL, LDLDIRECT in the last 72 hours.  Lab Results  Component Value Date   HGBA1C 5.4 04/16/2019   ------------------------------------------------------------------------------------------------------------------ No results for input(s): TSH, T4TOTAL, T3FREE, THYROIDAB in the last 72 hours.  Invalid input(s): FREET3 ------------------------------------------------------------------------------------------------------------------ No results for input(s): VITAMINB12, FOLATE, FERRITIN, TIBC, IRON, RETICCTPCT in the last 72 hours.  Coagulation profile Recent Labs  Lab 10/22/20 0353 10/27/20 0619  INR 1.1 1.1    No results for input(s): DDIMER in the last 72 hours.  Cardiac Enzymes No results for input(s): CKMB, TROPONINI, MYOGLOBIN in the last 168 hours.  Invalid input(s):  CK ------------------------------------------------------------------------------------------------------------------ No results found for: BNP   Roxan Hockey M.D on 10/28/2020 at 11:47 AM  Go to [email or web address] - for contact info  [facility] - Office  [phone]

## 2020-10-28 NOTE — Plan of Care (Signed)

## 2020-10-29 DIAGNOSIS — J9600 Acute respiratory failure, unspecified whether with hypoxia or hypercapnia: Secondary | ICD-10-CM | POA: Diagnosis not present

## 2020-10-29 DIAGNOSIS — K859 Acute pancreatitis without necrosis or infection, unspecified: Secondary | ICD-10-CM | POA: Diagnosis not present

## 2020-10-29 DIAGNOSIS — R109 Unspecified abdominal pain: Secondary | ICD-10-CM | POA: Diagnosis not present

## 2020-10-29 DIAGNOSIS — R6520 Severe sepsis without septic shock: Secondary | ICD-10-CM | POA: Diagnosis not present

## 2020-10-29 DIAGNOSIS — Z6841 Body Mass Index (BMI) 40.0 and over, adult: Secondary | ICD-10-CM | POA: Diagnosis not present

## 2020-10-29 DIAGNOSIS — A419 Sepsis, unspecified organism: Secondary | ICD-10-CM | POA: Diagnosis not present

## 2020-10-29 DIAGNOSIS — J189 Pneumonia, unspecified organism: Secondary | ICD-10-CM | POA: Diagnosis not present

## 2020-10-29 LAB — CBC
HCT: 32.7 % — ABNORMAL LOW (ref 36.0–46.0)
Hemoglobin: 10.1 g/dL — ABNORMAL LOW (ref 12.0–15.0)
MCH: 26.4 pg (ref 26.0–34.0)
MCHC: 30.9 g/dL (ref 30.0–36.0)
MCV: 85.6 fL (ref 80.0–100.0)
Platelets: 263 10*3/uL (ref 150–400)
RBC: 3.82 MIL/uL — ABNORMAL LOW (ref 3.87–5.11)
RDW: 14.1 % (ref 11.5–15.5)
WBC: 10.2 10*3/uL (ref 4.0–10.5)
nRBC: 0 % (ref 0.0–0.2)

## 2020-10-29 LAB — COMPREHENSIVE METABOLIC PANEL
ALT: 56 U/L — ABNORMAL HIGH (ref 0–44)
AST: 23 U/L (ref 15–41)
Albumin: 2.7 g/dL — ABNORMAL LOW (ref 3.5–5.0)
Alkaline Phosphatase: 109 U/L (ref 38–126)
Anion gap: 8 (ref 5–15)
BUN: 5 mg/dL — ABNORMAL LOW (ref 6–20)
CO2: 25 mmol/L (ref 22–32)
Calcium: 8.4 mg/dL — ABNORMAL LOW (ref 8.9–10.3)
Chloride: 102 mmol/L (ref 98–111)
Creatinine, Ser: 0.76 mg/dL (ref 0.44–1.00)
GFR, Estimated: >60 mL/min (ref >60)
Glucose, Bld: 118 mg/dL — ABNORMAL HIGH (ref 70–99)
Potassium: 3.9 mmol/L (ref 3.5–5.1)
Sodium: 135 mmol/L (ref 135–145)
Total Bilirubin: 1 mg/dL (ref 0.3–1.2)
Total Protein: 6.7 g/dL (ref 6.5–8.1)

## 2020-10-29 NOTE — Progress Notes (Signed)
[facility]  Fever yesterday. Plan for another 24 hr IV antibiotics. Tolerating diet and pain improved. Incision c/d/i with dermabond peeling, no erythema or drainage. nontender   Post op phone call with patient next week but if needs to change to in person she can call and notify the office.   Future Appointments  Date Time Provider Department Center  11/04/2020  4:05 PM Lucretia Roers, MD [facility] None   Algis Greenhouse, MD [facility] [address] [name], [place] [address] [phone] (office)

## 2020-10-29 NOTE — Progress Notes (Signed)
Patient Demographics:    Patricia [name], is a 29 y.o. female, DOB - [birth year], WLK:[id number]  Admit date - 10/25/2020   Admitting Physician Bernadette Hoit, DO  Outpatient Primary MD for the patient is Ralph Leyden, FNP  LOS - 3   Chief Complaint  Patient presents with   Chest Pain        Subjective:    Patricia [name] today has no emesis,  No chest pain,   Had BMs with Laxatives -spiked fever overnight---T max 101.1 -Cough is not worse  Assessment  & Plan :    Principal Problem:   Sepsis due to PNA Active Problems:   Acute Gallstone Pancreatitis   Morbid obesity with BMI of 50.0-59.9, adult (HCC)   Abdominal pain   Atypical chest pain   High total bilirubin   Pneumonia  Brief Summary- 29 year old with past medical history relevant for morbid obesity, depression/anxiety and cholelithiasis with recent lap chole on 10/23/2020  -Readmitted on 10/25/2020 with work-up so far suggestive of acute gallstone pancreatitis and sepsis secondary to pneumonia most likely aspiration related in the postop patient  A/p 1) acute gallstone pancreatitis--patient had cholelithiasis recently had lap chole on 10/23/2020 -Abdominal ultrasound, CT abdomen and pelvis as well as HIDA scan noted -No evidence of biliary leak or postop abscess -Discussed with general surgeon Dr. Constance Haw -Medical management for pancreatitis -Tolerated liquid diet, okay to advance -Continue IV fluids,  PPI and as needed antiemetics and pain medications  2)Morbid Obesity-this complicates overall care and input decreases mortality morbidity -after resolution of acute pancreatitis patient will be advised to adhere to low calorie diet, portion control and increase physical activity discussed with patient -Body mass index is 54.19 kg/m.  3) sepsis secondary to PNA in a postop patient--concern will be for possible aspiration  pneumonia--no hypoxia,  -Patient met sepsis criteria with fevers above 102, tachycardia and leukocytosis in the setting of pneumonia --continue IV Zosyn, anticipate discharge on p.o. Augmentin possibly in 1 to 2 days if no further fevers Overnight--- -spiked fever overnight---T max 101.1 --WBC is down to 10.2 from 18.9 -Repeat chest x-ray on 10/31/2019  4) constipation--resolved with laxatives   5) hyponatremia/hypokalemia--- replace electrolytes, potassium is up to 3.4 from 2.8  -c/n  IV fluids  Disposition/Need for in-Hospital Stay- patient unable to be discharged at this time due to --- acute pancreatitis requiring IV fluids and IV antiemetics and IV morphine sulfate for pain control, pneumonia with persistent fever requiring IV antibiotics  Status is: Inpatient  Remains inpatient appropriate because:cute pancreatitis requiring IV fluids and IV antiemetics and IV morphine sulfate for pain control, pneumonia with persistent fever requiring IV antibiotics   Disposition: The patient is from: Home              Anticipated d/c is to: Home              Anticipated d/c date is: 1 day              Patient currently is not medically stable to d/c. Barriers: Not Clinically Stable-  Code Status : full   Family Communication:    NA (patient is alert, awake and coherent)  Consults  :  gen surgery  DVT Prophylaxis  :  Lovenox -  - SCDs *   Lab Results  Component Value Date   PLT 263 10/29/2020    Inpatient Medications  Scheduled Meds:  dextromethorphan-guaiFENesin  1 tablet Oral BID   enoxaparin (LOVENOX) injection  80 mg Subcutaneous Q24H   lidocaine  1 patch Transdermal Once   pantoprazole (PROTONIX) IV  40 mg Intravenous Q24H   polyethylene glycol  17 g Oral Daily   senna-docusate  2 tablet Oral BID   Continuous Infusions:  sodium chloride 50 mL/hr at 10/28/20 1525   piperacillin-tazobactam (ZOSYN)  IV 3.375 g (10/29/20 0554)   PRN Meds:.acetaminophen, morphine  injection    Anti-infectives (From admission, onward)   Start     Dose/Rate Route Frequency Ordered Stop   10/27/20 0015  cefTRIAXone (ROCEPHIN) 2 g in sodium chloride 0.9 % 100 mL IVPB  Status:  Discontinued        2 g 200 mL/hr over 30 Minutes Intravenous Every 24 hours 10/26/20 0443 10/26/20 1842   10/27/20 0015  azithromycin (ZITHROMAX) 500 mg in sodium chloride 0.9 % 250 mL IVPB  Status:  Discontinued        500 mg 250 mL/hr over 60 Minutes Intravenous Every 24 hours 10/26/20 0443 10/26/20 1842   10/26/20 1945  piperacillin-tazobactam (ZOSYN) IVPB 3.375 g        3.375 g 12.5 mL/hr over 240 Minutes Intravenous Every 8 hours 10/26/20 1853     10/26/20 0400  cefTRIAXone (ROCEPHIN) 1 g in sodium chloride 0.9 % 100 mL IVPB        1 g 200 mL/hr over 30 Minutes Intravenous  Once 10/26/20 0357 10/26/20 0504   10/26/20 0400  azithromycin (ZITHROMAX) 500 mg in sodium chloride 0.9 % 250 mL IVPB        500 mg 250 mL/hr over 60 Minutes Intravenous  Once 10/26/20 0357 10/26/20 0642        Objective:   Vitals:   10/28/20 0535 10/28/20 1414 10/28/20 2029 10/29/20 0522  BP: (!) 103/56 (!) 143/94 111/71 111/84  Pulse: (!) 103 (!) 111 99 99  Resp: 17 20 17 16   Temp: 98.1 F (36.7 C) (!) 101.1 F (38.4 C) 98.5 F (36.9 C) 98.2 F (36.8 C)  TempSrc:  Oral Oral   SpO2: 96% 97% 97% 100%  Weight:      Height:        Wt Readings from Last 3 Encounters:  10/25/20 (!) 156.9 kg  10/22/20 (!) 156 kg  11/05/19 (!) 147.9 kg     Intake/Output Summary (Last 24 hours) at 10/29/2020 1116 Last data filed at 10/29/2020 0300 Gross per 24 hour  Intake 1025.56 ml  Output --  Net 1025.56 ml    Physical Exam  Gen:- Awake Alert, morbidly obese HEENT:- [place].AT, No sclera icterus Neck-Supple Neck,No JVD,.  Lungs-diminished breath sounds, no wheezing  CV- S1, S2 normal, regular  Abd-  +ve B.Sounds, Abd Soft, epigastric and  upper quadrant area tenderness without rebound or guarding, increased  truncal adiposity  extremity/Skin:- No  edema, pedal pulses present  Psych-affect is appropriate, oriented x3 Neuro-no new focal deficits, no tremors   Data Review:   Micro Results Recent Results (from the past 240 hour(s))  Resp Panel by RT-PCR (Flu A&B, Covid) Nasopharyngeal Swab     Status: None   Collection Time: 10/22/20  1:20 AM   Specimen: Nasopharyngeal Swab; Nasopharyngeal(NP) swabs in vial transport medium  Result Value Ref Range Status   SARS Coronavirus 2 by RT  PCR NEGATIVE NEGATIVE Final    Comment: (NOTE) SARS-CoV-2 target nucleic acids are NOT DETECTED.  The SARS-CoV-2 RNA is generally detectable in upper respiratory specimens during the acute phase of infection. The lowest concentration of SARS-CoV-2 viral copies this assay can detect is 138 copies/mL. A negative result does not preclude SARS-Cov-2 infection and should not be used as the sole basis for treatment or other patient management decisions. A negative result may occur with  improper specimen collection/handling, submission of specimen other than nasopharyngeal swab, presence of viral mutation(s) within the areas targeted by this assay, and inadequate number of viral copies(<138 copies/mL). A negative result must be combined with clinical observations, patient history, and epidemiological information. The expected result is Negative.  Fact Sheet for Patients:  EntrepreneurPulse.com.au  Fact Sheet for Healthcare Providers:  IncredibleEmployment.be  This test is no t yet approved or cleared by the [place] FDA and  has been authorized for detection and/or diagnosis of SARS-CoV-2 by FDA under an Emergency Use Authorization (EUA). This EUA will remain  in effect (meaning this test can be used) for the duration of the COVID-19 declaration under Section 564(b)(1) of the Act, 21 U.S.C.section 360bbb-3(b)(1), unless the authorization is terminated  or revoked sooner.         Influenza A by PCR NEGATIVE NEGATIVE Final   Influenza B by PCR NEGATIVE NEGATIVE Final    Comment: (NOTE) The Xpert Xpress SARS-CoV-2/FLU/RSV plus assay is intended as an aid in the diagnosis of influenza from Nasopharyngeal swab specimens and should not be used as a sole basis for treatment. Nasal washings and aspirates are unacceptable for Xpert Xpress SARS-CoV-2/FLU/RSV testing.  Fact Sheet for Patients: EntrepreneurPulse.com.au  Fact Sheet for Healthcare Providers: IncredibleEmployment.be  This test is not yet approved or cleared by the [place] FDA and has been authorized for detection and/or diagnosis of SARS-CoV-2 by FDA under an Emergency Use Authorization (EUA). This EUA will remain in effect (meaning this test can be used) for the duration of the COVID-19 declaration under Section 564(b)(1) of the Act, 21 U.S.C. section 360bbb-3(b)(1), unless the authorization is terminated or revoked.  Performed at [facility], [address], [place], [place] [address]   Resp Panel by RT-PCR (Flu A&B, Covid) Nasopharyngeal Swab     Status: None   Collection Time: 10/26/20  4:15 AM   Specimen: Nasopharyngeal Swab; Nasopharyngeal(NP) swabs in vial transport medium  Result Value Ref Range Status   SARS Coronavirus 2 by RT PCR NEGATIVE NEGATIVE Final    Comment: (NOTE) SARS-CoV-2 target nucleic acids are NOT DETECTED.  The SARS-CoV-2 RNA is generally detectable in upper respiratory specimens during the acute phase of infection. The lowest concentration of SARS-CoV-2 viral copies this assay can detect is 138 copies/mL. A negative result does not preclude SARS-Cov-2 infection and should not be used as the sole basis for treatment or other patient management decisions. A negative result may occur with  improper specimen collection/handling, submission of specimen other than nasopharyngeal swab, presence of viral mutation(s) within  the areas targeted by this assay, and inadequate number of viral copies(<138 copies/mL). A negative result must be combined with clinical observations, patient history, and epidemiological information. The expected result is Negative.  Fact Sheet for Patients:  EntrepreneurPulse.com.au  Fact Sheet for Healthcare Providers:  IncredibleEmployment.be  This test is no t yet approved or cleared by the [place] FDA and  has been authorized for detection and/or diagnosis of SARS-CoV-2 by FDA under an Emergency Use Authorization (EUA).  This EUA will remain  in effect (meaning this test can be used) for the duration of the COVID-19 declaration under Section 564(b)(1) of the Act, 21 U.S.C.section 360bbb-3(b)(1), unless the authorization is terminated  or revoked sooner.       Influenza A by PCR NEGATIVE NEGATIVE Final   Influenza B by PCR NEGATIVE NEGATIVE Final    Comment: (NOTE) The Xpert Xpress SARS-CoV-2/FLU/RSV plus assay is intended as an aid in the diagnosis of influenza from Nasopharyngeal swab specimens and should not be used as a sole basis for treatment. Nasal washings and aspirates are unacceptable for Xpert Xpress SARS-CoV-2/FLU/RSV testing.  Fact Sheet for Patients: EntrepreneurPulse.com.au  Fact Sheet for Healthcare Providers: IncredibleEmployment.be  This test is not yet approved or cleared by the [place] FDA and has been authorized for detection and/or diagnosis of SARS-CoV-2 by FDA under an Emergency Use Authorization (EUA). This EUA will remain in effect (meaning this test can be used) for the duration of the COVID-19 declaration under Section 564(b)(1) of the Act, 21 U.S.C. section 360bbb-3(b)(1), unless the authorization is terminated or revoked.  Performed at [facility], [address], [place], [place] [address]   Culture, blood (routine x 2) Call MD if unable to obtain prior  to antibiotics being given     Status: None (Preliminary result)   Collection Time: 10/26/20  6:47 AM   Specimen: BLOOD  Result Value Ref Range Status   Specimen Description BLOOD RIGHT ANTECUBITAL  Final   Special Requests   Final    BOTTLES DRAWN AEROBIC AND ANAEROBIC Blood Culture results may not be optimal due to an inadequate volume of blood received in culture bottles   Culture   Final    NO GROWTH 3 DAYS Performed at [facility], [address] [place], [place] [address]    Report Status PENDING  Incomplete  Culture, blood (routine x 2) Call MD if unable to obtain prior to antibiotics being given     Status: None (Preliminary result)   Collection Time: 10/26/20  6:47 AM   Specimen: BLOOD  Result Value Ref Range Status   Specimen Description BLOOD LEFT ANTECUBITAL  Final   Special Requests   Final    BOTTLES DRAWN AEROBIC AND ANAEROBIC Blood Culture adequate volume   Culture   Final    NO GROWTH 3 DAYS Performed at [facility], [address] [place], [place] [address]    Report Status PENDING  Incomplete  Culture, blood (Routine X 2) w Reflex to ID Panel     Status: None (Preliminary result)   Collection Time: 10/27/20 11:09 PM   Specimen: BLOOD LEFT HAND  Result Value Ref Range Status   Specimen Description BLOOD LEFT HAND  Final   Special Requests   Final    BOTTLES DRAWN AEROBIC AND ANAEROBIC Blood Culture adequate volume   Culture   Final    NO GROWTH 2 DAYS Performed at [facility], [address], [place], [place] [address]    Report Status PENDING  Incomplete  Culture, blood (Routine X 2) w Reflex to ID Panel     Status: None (Preliminary result)   Collection Time: 10/27/20 11:14 PM   Specimen: BLOOD LEFT HAND  Result Value Ref Range Status   Specimen Description BLOOD LEFT HAND  Final   Special Requests   Final    BOTTLES DRAWN AEROBIC AND ANAEROBIC Blood Culture adequate volume   Culture   Final    NO GROWTH 2 DAYS Performed  at [facility], [address] [place], [place] [address]    Report Status PENDING  Incomplete    Radiology Reports DG Chest 2 View  Result Date: 10/25/2020 CLINICAL DATA:  Chest pain status post gallbladder surgery on Friday EXAM: CHEST - 2 VIEW COMPARISON:  Chest radiograph October 21, 2020. FINDINGS: The heart size and mediastinal contours are within normal limits. Low lung volumes with hypoventilatory change in the dependent lungs. No visible pneumothorax or significant pleural effusion. Cholecystectomy clips. IMPRESSION: Low lung volumes with bilateral lower lobe and right middle lobe atelectasis, superimposed infection cannot be exclude. Electronically Signed   By: Dahlia Bailiff MD   On: 10/25/2020 17:37   DG Chest 2 View  Result Date: 10/21/2020 CLINICAL DATA:  Chest and abdominal pain EXAM: CHEST - 2 VIEW COMPARISON:  06/10/2018 FINDINGS: The heart size and mediastinal contours are within normal limits. Both lungs are clear. The visualized skeletal structures are unremarkable. IMPRESSION: No active cardiopulmonary disease. Electronically Signed   By: Donavan Foil M.D.   On: 10/21/2020 21:12   CT Angio Chest PE W/Cm &/Or Wo Cm  Result Date: 10/26/2020 CLINICAL DATA:  History of prior cholecystectomy 4 days ago with abdominal pain and distension with shortness of breath EXAM: CT ANGIOGRAPHY CHEST CT ABDOMEN AND PELVIS WITH CONTRAST TECHNIQUE: Multidetector CT imaging of the chest was performed using the standard protocol during bolus administration of intravenous contrast. Multiplanar CT image reconstructions and MIPs were obtained to evaluate the vascular anatomy. Multidetector CT imaging of the abdomen and pelvis was performed using the standard protocol during bolus administration of intravenous contrast. CONTRAST:  113m OMNIPAQUE IOHEXOL 350 MG/ML SOLN COMPARISON:  Chest x-ray from the previous day, CT 10/21/2020. FINDINGS: CTA CHEST FINDINGS Cardiovascular: Thoracic aorta and its branches are within normal  limits. No aneurysmal dilatation or dissection is noted. No significant cardiomegaly is seen. Significant motion artifact is noted limiting evaluation of the pulmonary artery although no large central pulmonary embolus is seen. Mediastinum/Nodes: Thoracic inlet is within normal limits. No sizable hilar or mediastinal adenopathy is seen. The esophagus as visualized is within normal limits. Lungs/Pleura: Small bilateral pleural effusions are noted with bilateral lower lobe consolidation left greater than right. No sizable parenchymal nodule is seen. Musculoskeletal: No chest wall abnormality. No acute or significant osseous findings. Review of the MIP images confirms the above findings. CT ABDOMEN and PELVIS FINDINGS Hepatobiliary: Changes consistent with recent cholecystectomy. Minimal air in fluid are noted in the gallbladder fossa which may simply be related to the prior surgery. Possibility biliary leak could not be totally excluded although no significant free fluid is noted within the abdomen. Pancreas: Considerable peripancreatic inflammatory changes are noted without evidence of pancreatic necrosis. These changes suggest acute pancreatitis. Correlation with lipase values is recommended. Spleen: Normal in size without focal abnormality. Adrenals/Urinary Tract: Adrenal glands are within normal limits. Kidneys demonstrate a normal enhancement pattern. No renal calculi or obstructive changes are seen. The bladder is well distended. Stomach/Bowel: The appendix is within normal limits. No obstructive or inflammatory changes of the colon are seen. Small bowel is within normal limits. Stomach demonstrates some mild wall thickening likely reactive to the adjacent pancreas changes. Vascular/Lymphatic: No significant vascular findings are present. No enlarged abdominal or pelvic lymph nodes. Reproductive: Uterus and bilateral adnexa are unremarkable. Other: No abdominal wall hernia or abnormality. No abdominopelvic  ascites. Postsurgical changes are noted consistent with the recent laparoscopic cholecystectomy. Small fat containing periumbilical hernia is noted. This is stable from  the prior exam. Musculoskeletal: No acute or significant osseous findings. Review of the MIP images confirms the above findings. IMPRESSION: CTA of the chest: No pulmonary emboli are noted. Small bilateral pleural effusions with lower lobe consolidation left greater than right. CT of the abdomen and pelvis: Changes consistent with the prior cholecystectomy with a small amount of air in fluid in the gallbladder fossa likely related to the postoperative state. No significant intra-abdominal fluid to suggest bile leak is noted at this time. Changes consistent with acute pancreatitis without evidence of pancreatic necrosis. Correlation with lipase levels is recommended. Electronically Signed   By: Inez Catalina M.D.   On: 10/26/2020 03:29   CT Abdomen Pelvis W Contrast  Result Date: 10/26/2020 CLINICAL DATA:  History of prior cholecystectomy 4 days ago with abdominal pain and distension with shortness of breath EXAM: CT ANGIOGRAPHY CHEST CT ABDOMEN AND PELVIS WITH CONTRAST TECHNIQUE: Multidetector CT imaging of the chest was performed using the standard protocol during bolus administration of intravenous contrast. Multiplanar CT image reconstructions and MIPs were obtained to evaluate the vascular anatomy. Multidetector CT imaging of the abdomen and pelvis was performed using the standard protocol during bolus administration of intravenous contrast. CONTRAST:  118m OMNIPAQUE IOHEXOL 350 MG/ML SOLN COMPARISON:  Chest x-ray from the previous day, CT 10/21/2020. FINDINGS: CTA CHEST FINDINGS Cardiovascular: Thoracic aorta and its branches are within normal limits. No aneurysmal dilatation or dissection is noted. No significant cardiomegaly is seen. Significant motion artifact is noted limiting evaluation of the pulmonary artery although no large central  pulmonary embolus is seen. Mediastinum/Nodes: Thoracic inlet is within normal limits. No sizable hilar or mediastinal adenopathy is seen. The esophagus as visualized is within normal limits. Lungs/Pleura: Small bilateral pleural effusions are noted with bilateral lower lobe consolidation left greater than right. No sizable parenchymal nodule is seen. Musculoskeletal: No chest wall abnormality. No acute or significant osseous findings. Review of the MIP images confirms the above findings. CT ABDOMEN and PELVIS FINDINGS Hepatobiliary: Changes consistent with recent cholecystectomy. Minimal air in fluid are noted in the gallbladder fossa which may simply be related to the prior surgery. Possibility biliary leak could not be totally excluded although no significant free fluid is noted within the abdomen. Pancreas: Considerable peripancreatic inflammatory changes are noted without evidence of pancreatic necrosis. These changes suggest acute pancreatitis. Correlation with lipase values is recommended. Spleen: Normal in size without focal abnormality. Adrenals/Urinary Tract: Adrenal glands are within normal limits. Kidneys demonstrate a normal enhancement pattern. No renal calculi or obstructive changes are seen. The bladder is well distended. Stomach/Bowel: The appendix is within normal limits. No obstructive or inflammatory changes of the colon are seen. Small bowel is within normal limits. Stomach demonstrates some mild wall thickening likely reactive to the adjacent pancreas changes. Vascular/Lymphatic: No significant vascular findings are present. No enlarged abdominal or pelvic lymph nodes. Reproductive: Uterus and bilateral adnexa are unremarkable. Other: No abdominal wall hernia or abnormality. No abdominopelvic ascites. Postsurgical changes are noted consistent with the recent laparoscopic cholecystectomy. Small fat containing periumbilical hernia is noted. This is stable from the prior exam. Musculoskeletal: No  acute or significant osseous findings. Review of the MIP images confirms the above findings. IMPRESSION: CTA of the chest: No pulmonary emboli are noted. Small bilateral pleural effusions with lower lobe consolidation left greater than right. CT of the abdomen and pelvis: Changes consistent with the prior cholecystectomy with a small amount of air in fluid in the gallbladder fossa likely related to the postoperative state.  No significant intra-abdominal fluid to suggest bile leak is noted at this time. Changes consistent with acute pancreatitis without evidence of pancreatic necrosis. Correlation with lipase levels is recommended. Electronically Signed   By: Inez Catalina M.D.   On: 10/26/2020 03:29   CT ABDOMEN PELVIS W CONTRAST  Result Date: 10/22/2020 CLINICAL DATA:  Abdominal pain. Suspected biliary obstruction. Right-sided abdominal pain with nausea and vomiting. EXAM: CT ABDOMEN AND PELVIS WITH CONTRAST TECHNIQUE: Multidetector CT imaging of the abdomen and pelvis was performed using the standard protocol following bolus administration of intravenous contrast. CONTRAST:  120m OMNIPAQUE IOHEXOL 300 MG/ML  SOLN COMPARISON:  CT chest 06/10/2018.  CT abdomen and pelvis 06/03/2012 FINDINGS: Lower chest: The lung bases are clear. Hepatobiliary: Mild to moderate intra and extrahepatic bile duct dilatation. No obstructing stone or mass is identified. Suggest MRCP to exclude an occult lesion. Suggestion of vague attenuation change in the gallbladder neck probably representing gallstones. No gallbladder wall thickening or infiltration. No focal liver lesions. Pancreas: Unremarkable. No pancreatic ductal dilatation or surrounding inflammatory changes. Spleen: Normal in size without focal abnormality. Adrenals/Urinary Tract: Adrenal glands are unremarkable. Kidneys are normal, without renal calculi, focal lesion, or hydronephrosis. Bladder is unremarkable. Stomach/Bowel: Stomach is within normal limits. Appendix  appears normal. No evidence of bowel wall thickening, distention, or inflammatory changes. Vascular/Lymphatic: No significant vascular findings are present. No enlarged abdominal or pelvic lymph nodes. Reproductive: Uterus and bilateral adnexa are unremarkable. Other: No free air or free fluid in the abdomen. Several periumbilical hernias containing fat. Musculoskeletal: No acute or significant osseous findings. IMPRESSION: 1. Mild to moderate intra and extrahepatic bile duct dilatation. No obstructing stone or mass is identified. Probable cholelithiasis. Differential diagnosis may include non radiopaque common duct stones, occult mass, or inflammatory process/stricture. Suggest MRCP to exclude an occult lesion. 2. No evidence of bowel obstruction or inflammation. Normal appendix. 3. Several periumbilical hernias containing fat. Electronically Signed   By: WLucienne CapersM.D.   On: 10/22/2020 00:11   MR 3D Recon At Scanner  Result Date: 10/22/2020 CLINICAL DATA:  Abdominal pain.  Evaluate for biliary obstruction. EXAM: MRI ABDOMEN WITHOUT AND WITH CONTRAST (INCLUDING MRCP) TECHNIQUE: Multiplanar multisequence MR imaging of the abdomen was performed both before and after the administration of intravenous contrast. Heavily T2-weighted images of the biliary and pancreatic ducts were obtained, and three-dimensional MRCP images were rendered by post processing. CONTRAST:  140mGADAVIST GADOBUTROL 1 MMOL/ML IV SOLN COMPARISON:  Ultrasound 10/22/2020 FINDINGS: Lower chest: No acute findings. Hepatobiliary: Mild diffuse hepatic steatosis. Within the limitations of unenhanced technique there is no focal liver abnormality. The gallbladder wall appears mildly thickened measuring up to 4 mm. Stone within the proximal gallbladder measures 1.5 cm. No biliary ductal dilatation. The common bile duct measures 5 mm in diameter. No signs of choledocholithiasis. Pancreas: No mass, inflammatory changes, or other parenchymal  abnormality identified. Spleen:  Within normal limits in size and appearance. Adrenals/Urinary Tract: Normal appearance of the adrenal glands. The kidneys are unremarkable. Stomach/Bowel: Visualized portions within the abdomen are unremarkable. Vascular/Lymphatic: No pathologically enlarged lymph nodes identified. No abdominal aortic aneurysm demonstrated. Other:  No free fluid or fluid collections. Musculoskeletal: No suspicious bone lesions identified. IMPRESSION: 1. Gallstones with gallbladder wall thickening. Findings consistent with acute cholecystitis. 2. No biliary ductal dilatation or signs of choledocholithiasis. 3. Mild diffuse hepatic steatosis. Electronically Signed   By: TaKerby Moors.D.   On: 10/22/2020 10:06   MR ABDOMEN MRCP W WO CONTAST  Result Date: 10/22/2020  CLINICAL DATA:  Abdominal pain.  Evaluate for biliary obstruction. EXAM: MRI ABDOMEN WITHOUT AND WITH CONTRAST (INCLUDING MRCP) TECHNIQUE: Multiplanar multisequence MR imaging of the abdomen was performed both before and after the administration of intravenous contrast. Heavily T2-weighted images of the biliary and pancreatic ducts were obtained, and three-dimensional MRCP images were rendered by post processing. CONTRAST:  76m GADAVIST GADOBUTROL 1 MMOL/ML IV SOLN COMPARISON:  Ultrasound 10/22/2020 FINDINGS: Lower chest: No acute findings. Hepatobiliary: Mild diffuse hepatic steatosis. Within the limitations of unenhanced technique there is no focal liver abnormality. The gallbladder wall appears mildly thickened measuring up to 4 mm. Stone within the proximal gallbladder measures 1.5 cm. No biliary ductal dilatation. The common bile duct measures 5 mm in diameter. No signs of choledocholithiasis. Pancreas: No mass, inflammatory changes, or other parenchymal abnormality identified. Spleen:  Within normal limits in size and appearance. Adrenals/Urinary Tract: Normal appearance of the adrenal glands. The kidneys are unremarkable.  Stomach/Bowel: Visualized portions within the abdomen are unremarkable. Vascular/Lymphatic: No pathologically enlarged lymph nodes identified. No abdominal aortic aneurysm demonstrated. Other:  No free fluid or fluid collections. Musculoskeletal: No suspicious bone lesions identified. IMPRESSION: 1. Gallstones with gallbladder wall thickening. Findings consistent with acute cholecystitis. 2. No biliary ductal dilatation or signs of choledocholithiasis. 3. Mild diffuse hepatic steatosis. Electronically Signed   By: TKerby MoorsM.D.   On: 10/22/2020 10:06   NM HEPATO BILIARY LEAK  Result Date: 10/27/2020 CLINICAL DATA:  Upper abdominal pain status post cholecystectomy. EXAM: NUCLEAR MEDICINE HEPATOBILIARY IMAGING TECHNIQUE: Sequential images of the abdomen were obtained out to 60 minutes following intravenous administration of radiopharmaceutical. RADIOPHARMACEUTICALS:  5.5 mCi Tc-964mCholetec IV COMPARISON:  CT 10/26/2020 and abdominal sonogram from 10/26/2020 FINDINGS: Prompt uptake and biliary excretion of activity by the liver is seen. Biliary activity passes into small bowel, consistent with patent common bile duct. No extravasation of the radiopharmaceutical to indicate active bowel leak. IMPRESSION: 1. Patent common bile duct without extravasation of the radiopharmaceutical to indicate a bile leak. Electronically Signed   By: TaKerby Moors.D.   On: 10/27/2020 11:44   USKoreabdomen Limited RUQ (LIVER/GB)  Result Date: 10/26/2020 CLINICAL DATA:  Abdominal pain. Four days status post cholecystectomy. EXAM: ULTRASOUND ABDOMEN LIMITED RIGHT UPPER QUADRANT COMPARISON:  None. FINDINGS: Gallbladder: Surgically absent. A complex postop fluid collection is seen within the gallbladder fossa which measures approximately 7.9 x 3.2 cm. Common bile duct: Diameter: 7 mm, within normal limits post cholecystectomy. Liver: No focal lesion identified. Within normal limits in parenchymal echogenicity. Portal vein is  patent on color Doppler imaging with normal direction of blood flow towards the liver. Other: None. IMPRESSION: 7.9 x 3.2 cm complex postop fluid collection within the gallbladder fossa. No evidence of biliary ductal dilatation. Electronically Signed   By: JoMarlaine Hind.D.   On: 10/26/2020 12:51   USKoreabdomen Limited RUQ (LIVER/GB)  Result Date: 10/22/2020 CLINICAL DATA:  [age]ear old female with right upper quadrant pain. Nausea vomiting today. Abnormal LFTs. EXAM: ULTRASOUND ABDOMEN LIMITED RIGHT UPPER QUADRANT COMPARISON:  CT Abdomen and Pelvis 2346 hours yesterday. FINDINGS: Gallbladder: Shadowing gallstones (image 4) individually estimated at up to 15 mm diameter. Partially contracted gallbladder, with wall thickening up to 5 mm. No pericholecystic fluid, but positive sonographic Murphy sign elicited. Common bile duct: Diameter: 7 mm, mildly dilated. No filling defect identified within the visible duct (images 17 through 19). Liver: Liver echogenicity within normal limits. Central intrahepatic biliary ductal dilatation better demonstrated by CT. No discrete liver  lesion. Portal vein is patent on color Doppler imaging with normal direction of blood flow towards the liver. Other: Negative visible right kidney. IMPRESSION: 1. Gallstones, gallbladder wall thickening, biliary ductal dilatation (as seen by CT) and positive sonographic Murphy sign. 2. This constellation is suspicious for Choledocholithiasis with possible superimposed Acute Cholecystitis. Electronically Signed   By: Genevie Ann M.D.   On: 10/22/2020 08:24     CBC Recent Labs  Lab 10/25/20 2353 10/27/20 0619 10/28/20 0933 10/29/20 0512  WBC 18.9* 12.5* 11.9* 10.2  HGB 12.4 10.5* 10.5* 10.1*  HCT 38.9 32.8* 33.8* 32.7*  PLT 280 237 260 263  MCV 84.4 83.9 84.7 85.6  MCH 26.9 26.9 26.3 26.4  MCHC 31.9 32.0 31.1 30.9  RDW 13.7 13.7 13.8 14.1    Chemistries  Recent Labs  Lab 10/23/20 0812 10/23/20 0812 10/25/20 2353 10/26/20 0647  10/27/20 0619 10/28/20 0933 10/29/20 0512  NA 134*  --  131*  --  134* 134* 135  K 3.6  --  2.9*  --  2.8* 3.4* 3.9  CL 103  --  95*  --  97* 99 102  CO2 23  --  24  --  28 26 25   GLUCOSE 118*  --  104*  --  110* 114* 118*  BUN 8  --  6  --  7 5* 5*  CREATININE 0.79   < > 0.97 0.79 0.78 0.79 0.76  CALCIUM 8.3*  --  8.3*  --  8.1* 8.4* 8.4*  MG  --   --   --  2.2 2.5*  --   --   AST 204*  --  27  --  17  --  23  ALT 372*  --  143*  --  75*  --  56*  ALKPHOS 116  --  124  --  97  --  109  BILITOT 3.5*  --  2.7*  --  1.4*  --  1.0   < > = values in this interval not displayed.   ------------------------------------------------------------------------------------------------------------------ No results for input(s): CHOL, HDL, LDLCALC, TRIG, CHOLHDL, LDLDIRECT in the last 72 hours.  Lab Results  Component Value Date   HGBA1C 5.4 04/16/2019   ------------------------------------------------------------------------------------------------------------------ No results for input(s): TSH, T4TOTAL, T3FREE, THYROIDAB in the last 72 hours.  Invalid input(s): FREET3 ------------------------------------------------------------------------------------------------------------------ No results for input(s): VITAMINB12, FOLATE, FERRITIN, TIBC, IRON, RETICCTPCT in the last 72 hours.  Coagulation profile Recent Labs  Lab 10/27/20 0619  INR 1.1    No results for input(s): DDIMER in the last 72 hours.  Cardiac Enzymes No results for input(s): CKMB, TROPONINI, MYOGLOBIN in the last 168 hours.  Invalid input(s): CK ------------------------------------------------------------------------------------------------------------------ No results found for: BNP   Roxan Hockey M.D on 10/29/2020 at 11:16 AM  Go to [email or web address] - for contact info  [facility] - Office  [phone]

## 2020-10-30 ENCOUNTER — Inpatient Hospital Stay (HOSPITAL_COMMUNITY): Payer: 59

## 2020-10-30 DIAGNOSIS — R6520 Severe sepsis without septic shock: Secondary | ICD-10-CM | POA: Diagnosis not present

## 2020-10-30 DIAGNOSIS — K859 Acute pancreatitis without necrosis or infection, unspecified: Secondary | ICD-10-CM | POA: Diagnosis not present

## 2020-10-30 DIAGNOSIS — A419 Sepsis, unspecified organism: Secondary | ICD-10-CM | POA: Diagnosis not present

## 2020-10-30 DIAGNOSIS — R109 Unspecified abdominal pain: Secondary | ICD-10-CM | POA: Diagnosis not present

## 2020-10-30 DIAGNOSIS — J189 Pneumonia, unspecified organism: Secondary | ICD-10-CM | POA: Diagnosis not present

## 2020-10-30 DIAGNOSIS — Z6841 Body Mass Index (BMI) 40.0 and over, adult: Secondary | ICD-10-CM | POA: Diagnosis not present

## 2020-10-30 DIAGNOSIS — J9600 Acute respiratory failure, unspecified whether with hypoxia or hypercapnia: Secondary | ICD-10-CM | POA: Diagnosis not present

## 2020-10-30 MED ORDER — ACETAMINOPHEN 325 MG PO TABS: 650.0000 mg | ORAL_TABLET | Freq: Four times a day (QID) | ORAL | 0 refills | Status: DC | PRN

## 2020-10-30 MED ORDER — AMOXICILLIN-POT CLAVULANATE 875-125 MG PO TABS: 1.0000 | ORAL_TABLET | Freq: Two times a day (BID) | ORAL | 0 refills | Status: AC

## 2020-10-30 MED ORDER — PANTOPRAZOLE SODIUM 40 MG PO TBEC: 40.0000 mg | DELAYED_RELEASE_TABLET | Freq: Every day | ORAL | 1 refills | Status: DC

## 2020-10-30 MED ORDER — POLYETHYLENE GLYCOL 3350 17 G PO PACK
17.0000 g | PACK | Freq: Every day | ORAL | 0 refills | Status: DC
Start: 2020-10-31 — End: 2020-12-02

## 2020-10-30 NOTE — Plan of Care (Signed)

## 2020-10-30 NOTE — Discharge Instructions (Signed)
1)Avoid ibuprofen/Advil/Aleve/Motrin/Goody Powders/Naproxen/BC powders/Meloxicam/Diclofenac/Indomethacin and other Nonsteroidal anti-inflammatory medications as these will make you more likely to bleed and can cause stomach ulcers, can also cause Kidney problems.   2) follow-up with a general surgeon Dr. Larae Grooms as previously advised next week

## 2020-10-30 NOTE — Discharge Summary (Signed)
Patricia [name], is a 29 y.o. female  DOB [birth year]  MRN [id number].  Admission date:  10/25/2020  Admitting Physician  Bernadette Hoit, DO  Discharge Date:  10/30/2020   Primary MD  Ralph Leyden, FNP  Recommendations for primary care physician for things to follow:   1)Avoid ibuprofen/Advil/Aleve/Motrin/Goody Powders/Naproxen/BC powders/Meloxicam/Diclofenac/Indomethacin and other Nonsteroidal anti-inflammatory medications as these will make you more likely to bleed and can cause stomach ulcers, can also cause Kidney problems.   2) follow-up with a general surgeon Dr. Blake Divine as previously advised next week  Admission Diagnosis  Pneumonia [J18.9] Atypical chest pain [R07.89] Hypoxia [R09.02] Abdominal pain [R10.9] Acute pancreatitis without infection or necrosis, unspecified pancreatitis type [K85.90] Community acquired pneumonia, unspecified laterality [J18.9]   Discharge Diagnosis  Pneumonia [J18.9] Atypical chest pain [R07.89] Hypoxia [R09.02] Abdominal pain [R10.9] Acute pancreatitis without infection or necrosis, unspecified pancreatitis type [K85.90] Community acquired pneumonia, unspecified laterality [J18.9]    Principal Problem:   Sepsis due to PNA Active Problems:   Acute Gallstone Pancreatitis   Morbid obesity with BMI of 50.0-59.9, adult ([place])   Abdominal pain   Atypical chest pain   High total bilirubin   Pneumonia      Past Medical History:  Diagnosis Date  . Anemia   . Anxiety   . Bacterial vaginosis   . Chlamydia   . Depression    PP depression after 3rd pregnancy - resolved with therarpy  . Migraine   . Preterm labor   . Preterm labor without delivery in second trimester June 2012   Positive FFN in June  . UTI (lower urinary tract infection)     Past Surgical History:  Procedure Laterality Date  . CHOLECYSTECTOMY N/A 10/22/2020   Procedure:  LAPAROSCOPIC CHOLECYSTECTOMY;  Surgeon: Virl Cagey, MD;  Location: [facility];  Service: General;  Laterality: N/A;  . NO PAST SURGERIES    . TUBAL LIGATION N/A 09/15/2019   Procedure: POST PARTUM TUBAL LIGATION;  Surgeon: Florian Buff, MD;  Location: [facility];  Service: Gynecology;  Laterality: N/A;     HPI  from the history and physical done on the day of admission:    Chief Complaint: Chest pain  HPI: Patricia [name] is a 29 y.o. female with medical history significant for anxiety and depression who presents to the emergency department due to chest pain which started on Sunday (11/28).  Chest pain was reproducible, it was midsternal and radiates to both sides of chest.  Pain was rated 7/10 on pain scale, it was constant and worsens with cough, deep breath.  No alleviating factors was known.  She also complained of cough with occasional production of clear mucus.  Symptoms was associated with occasional weights on the forehead. Patient was recently admitted from 11/25-11/27 due to abdominal pain secondary to cholecystitis S/P cholecystectomy.  She did complain of same atypical chest pain during last admission, chest pain resolved, EKG done at that time showed sinus rhythm with nonspecific T wave changes and chest x-ray  at that time showed no infiltrates or edema. She denies nausea, vomiting, diarrhea or any known sick contacts.  ED Course:  In the emergency department, she was tachycardic and tachypneic.  Work-up in the ED showed leukocytosis, hyponatremia, hypokalemia, hypoalbuminemia, ALT 143, lipase 86, T bili 2.7.  Lactic acid was negative.  Troponin was negative. CT angiography of chest with contrast showed no PE, but showed small bilateral pleural effusions with lower lobe consolidation left greater than the right.  CT abdomen and pelvis with contrast showed changes consistent with pericarditis without evidence of pancreatic necrosis noted. Chest x-ray showed low lung volumes with  bilateral lower lobe and right middle lobe atelectasis, superimposed infection cannot be excluded. Patient was empirically started on IV ceftriaxone and azithromycin due to presumed CAP POA.  IV morphine Zofran were given. Hospitalist was asked to admit patient for further evaluation and management.    Hospital Course:   Brief Summary- 29 year old with past medical history relevant for morbid obesity, depression/anxiety and cholelithiasis with recent lap chole on 10/23/2020  -Readmitted on 10/25/2020 with work-up so far suggestive of acute gallstone pancreatitis and sepsis secondary to pneumonia most likely aspiration related in the postop patient  A/p 1) acute gallstone pancreatitis--patient had cholelithiasis, recently had lap chole on 10/23/2020 -Abdominal ultrasound, CT abdomen and pelvis as well as HIDA scan noted -No evidence of biliary leak or postop abscess -Discussed with general surgeon Dr. Constance Haw -Medical management for pancreatitis -Diet was advanced slowly patient tolerating solid diet, having BMs, -  2)Morbid Obesity-this complicates overall care and input decreases mortality morbidity -after resolution of acute pancreatitis patient will be advised to adhere to low calorie diet, portion control and increase physical activity discussed with patient -Body mass index is 54.19 kg/m.  3) sepsis secondary to PNA in a postop patient--concern will be for possible aspiration pneumonia--no hypoxia,  -Patient met sepsis criteria with fevers above 102, tachycardia and leukocytosis in the setting of pneumonia -Treated with IV Zosyn for 5 days,,  -From 10/27/2019 1 repeat blood cultures from 10/27/2020 remains negative -No further fevers --WBC is down to 10.2 from 18.9 -Repeat chest x-ray on 10/31/2019 without acute findings -Okay to discharge home on p.o. Augmentin  4) constipation--resolved with laxatives   5) hyponatremia/hypokalemia--- replace electrolytes,  --Resolved  with replacements and  IV fluids  Disposition--home  Family Communication:    NA (patient is alert, awake and coherent)  Consults  :  gen surgery  Discharge Condition: Stable  Follow UP--- general surgery as an outpatient  Diet and Activity recommendation:  As advised  Discharge Instructions    Discharge Instructions    Call MD for:  difficulty breathing, headache or visual disturbances   Complete by: As directed    Call MD for:  persistant dizziness or light-headedness   Complete by: As directed    Call MD for:  persistant nausea and vomiting   Complete by: As directed    Call MD for:  severe uncontrolled pain   Complete by: As directed    Call MD for:  temperature >100.4   Complete by: As directed    Diet - low sodium heart healthy   Complete by: As directed    Discharge instructions   Complete by: As directed    1)Avoid ibuprofen/Advil/Aleve/Motrin/Goody Powders/Naproxen/BC powders/Meloxicam/Diclofenac/Indomethacin and other Nonsteroidal anti-inflammatory medications as these will make you more likely to bleed and can cause stomach ulcers, can also cause Kidney problems.   2) follow-up with a general surgeon Dr. Blake Divine as  previously advised next week   Discharge wound care:   Complete by: As directed    Keep postoperative wounds clean and dry   Increase activity slowly   Complete by: As directed       Discharge Medications     Allergies as of 10/30/2020   No Known Allergies     Medication List    STOP taking these medications   acyclovir 400 MG tablet Commonly known as: ZOVIRAX     TAKE these medications   acetaminophen 325 MG tablet Commonly known as: TYLENOL Take 2 tablets (650 mg total) by mouth every 6 (six) hours as needed for fever.   amoxicillin-clavulanate 875-125 MG tablet Commonly known as: Augmentin Take 1 tablet by mouth 2 (two) times daily for 5 days.   CitraNatal Assure 35-1 & 300 MG tablet One tablet and one capsule  daily What changed:   how much to take  how to take this  when to take this  additional instructions   oxyCODONE 5 MG immediate release tablet Commonly known as: Oxy IR/ROXICODONE Take 1 tablet (5 mg total) by mouth every 4 (four) hours as needed for moderate pain.   pantoprazole 40 MG tablet Commonly known as: Protonix Take 1 tablet (40 mg total) by mouth daily.   polyethylene glycol 17 g packet Commonly known as: MIRALAX / GLYCOLAX Take 17 g by mouth daily. Start taking on: October 31, 2020   sertraline 50 MG tablet Commonly known as: ZOLOFT Take 50 mg by mouth daily.            Discharge Care Instructions  (From admission, onward)         Start     Ordered   10/30/20 0000  Discharge wound care:       Comments: Keep postoperative wounds clean and dry   10/30/20 1146          Major procedures and Radiology Reports - PLEASE review detailed and final reports for all details, in brief -   DG Chest 2 View  Result Date: 10/30/2020 CLINICAL DATA:  History of pancreatitis and fever EXAM: CHEST - 2 VIEW COMPARISON:  10/25/2020 FINDINGS: Cardiac shadow is within normal limits. Bibasilar atelectasis is seen although slightly improved when compared with prior exam. No bony abnormality is noted. IMPRESSION: Slight improved aeration in the bases bilaterally. Electronically Signed   By: Inez Catalina M.D.   On: 10/30/2020 10:48   DG Chest 2 View  Result Date: 10/25/2020 CLINICAL DATA:  Chest pain status post gallbladder surgery on Friday EXAM: CHEST - 2 VIEW COMPARISON:  Chest radiograph October 21, 2020. FINDINGS: The heart size and mediastinal contours are within normal limits. Low lung volumes with hypoventilatory change in the dependent lungs. No visible pneumothorax or significant pleural effusion. Cholecystectomy clips. IMPRESSION: Low lung volumes with bilateral lower lobe and right middle lobe atelectasis, superimposed infection cannot be exclude. Electronically  Signed   By: Dahlia Bailiff MD   On: 10/25/2020 17:37   DG Chest 2 View  Result Date: 10/21/2020 CLINICAL DATA:  Chest and abdominal pain EXAM: CHEST - 2 VIEW COMPARISON:  06/10/2018 FINDINGS: The heart size and mediastinal contours are within normal limits. Both lungs are clear. The visualized skeletal structures are unremarkable. IMPRESSION: No active cardiopulmonary disease. Electronically Signed   By: Donavan Foil M.D.   On: 10/21/2020 21:12   CT Angio Chest PE W/Cm &/Or Wo Cm  Result Date: 10/26/2020 CLINICAL DATA:  History of prior cholecystectomy 4  days ago with abdominal pain and distension with shortness of breath EXAM: CT ANGIOGRAPHY CHEST CT ABDOMEN AND PELVIS WITH CONTRAST TECHNIQUE: Multidetector CT imaging of the chest was performed using the standard protocol during bolus administration of intravenous contrast. Multiplanar CT image reconstructions and MIPs were obtained to evaluate the vascular anatomy. Multidetector CT imaging of the abdomen and pelvis was performed using the standard protocol during bolus administration of intravenous contrast. CONTRAST:  11m OMNIPAQUE IOHEXOL 350 MG/ML SOLN COMPARISON:  Chest x-ray from the previous day, CT 10/21/2020. FINDINGS: CTA CHEST FINDINGS Cardiovascular: Thoracic aorta and its branches are within normal limits. No aneurysmal dilatation or dissection is noted. No significant cardiomegaly is seen. Significant motion artifact is noted limiting evaluation of the pulmonary artery although no large central pulmonary embolus is seen. Mediastinum/Nodes: Thoracic inlet is within normal limits. No sizable hilar or mediastinal adenopathy is seen. The esophagus as visualized is within normal limits. Lungs/Pleura: Small bilateral pleural effusions are noted with bilateral lower lobe consolidation left greater than right. No sizable parenchymal nodule is seen. Musculoskeletal: No chest wall abnormality. No acute or significant osseous findings. Review of  the MIP images confirms the above findings. CT ABDOMEN and PELVIS FINDINGS Hepatobiliary: Changes consistent with recent cholecystectomy. Minimal air in fluid are noted in the gallbladder fossa which may simply be related to the prior surgery. Possibility biliary leak could not be totally excluded although no significant free fluid is noted within the abdomen. Pancreas: Considerable peripancreatic inflammatory changes are noted without evidence of pancreatic necrosis. These changes suggest acute pancreatitis. Correlation with lipase values is recommended. Spleen: Normal in size without focal abnormality. Adrenals/Urinary Tract: Adrenal glands are within normal limits. Kidneys demonstrate a normal enhancement pattern. No renal calculi or obstructive changes are seen. The bladder is well distended. Stomach/Bowel: The appendix is within normal limits. No obstructive or inflammatory changes of the colon are seen. Small bowel is within normal limits. Stomach demonstrates some mild wall thickening likely reactive to the adjacent pancreas changes. Vascular/Lymphatic: No significant vascular findings are present. No enlarged abdominal or pelvic lymph nodes. Reproductive: Uterus and bilateral adnexa are unremarkable. Other: No abdominal wall hernia or abnormality. No abdominopelvic ascites. Postsurgical changes are noted consistent with the recent laparoscopic cholecystectomy. Small fat containing periumbilical hernia is noted. This is stable from the prior exam. Musculoskeletal: No acute or significant osseous findings. Review of the MIP images confirms the above findings. IMPRESSION: CTA of the chest: No pulmonary emboli are noted. Small bilateral pleural effusions with lower lobe consolidation left greater than right. CT of the abdomen and pelvis: Changes consistent with the prior cholecystectomy with a small amount of air in fluid in the gallbladder fossa likely related to the postoperative state. No significant  intra-abdominal fluid to suggest bile leak is noted at this time. Changes consistent with acute pancreatitis without evidence of pancreatic necrosis. Correlation with lipase levels is recommended. Electronically Signed   By: MInez CatalinaM.D.   On: 10/26/2020 03:29   CT Abdomen Pelvis W Contrast  Result Date: 10/26/2020 CLINICAL DATA:  History of prior cholecystectomy 4 days ago with abdominal pain and distension with shortness of breath EXAM: CT ANGIOGRAPHY CHEST CT ABDOMEN AND PELVIS WITH CONTRAST TECHNIQUE: Multidetector CT imaging of the chest was performed using the standard protocol during bolus administration of intravenous contrast. Multiplanar CT image reconstructions and MIPs were obtained to evaluate the vascular anatomy. Multidetector CT imaging of the abdomen and pelvis was performed using the standard protocol during bolus administration of  intravenous contrast. CONTRAST:  183m OMNIPAQUE IOHEXOL 350 MG/ML SOLN COMPARISON:  Chest x-ray from the previous day, CT 10/21/2020. FINDINGS: CTA CHEST FINDINGS Cardiovascular: Thoracic aorta and its branches are within normal limits. No aneurysmal dilatation or dissection is noted. No significant cardiomegaly is seen. Significant motion artifact is noted limiting evaluation of the pulmonary artery although no large central pulmonary embolus is seen. Mediastinum/Nodes: Thoracic inlet is within normal limits. No sizable hilar or mediastinal adenopathy is seen. The esophagus as visualized is within normal limits. Lungs/Pleura: Small bilateral pleural effusions are noted with bilateral lower lobe consolidation left greater than right. No sizable parenchymal nodule is seen. Musculoskeletal: No chest wall abnormality. No acute or significant osseous findings. Review of the MIP images confirms the above findings. CT ABDOMEN and PELVIS FINDINGS Hepatobiliary: Changes consistent with recent cholecystectomy. Minimal air in fluid are noted in the gallbladder fossa  which may simply be related to the prior surgery. Possibility biliary leak could not be totally excluded although no significant free fluid is noted within the abdomen. Pancreas: Considerable peripancreatic inflammatory changes are noted without evidence of pancreatic necrosis. These changes suggest acute pancreatitis. Correlation with lipase values is recommended. Spleen: Normal in size without focal abnormality. Adrenals/Urinary Tract: Adrenal glands are within normal limits. Kidneys demonstrate a normal enhancement pattern. No renal calculi or obstructive changes are seen. The bladder is well distended. Stomach/Bowel: The appendix is within normal limits. No obstructive or inflammatory changes of the colon are seen. Small bowel is within normal limits. Stomach demonstrates some mild wall thickening likely reactive to the adjacent pancreas changes. Vascular/Lymphatic: No significant vascular findings are present. No enlarged abdominal or pelvic lymph nodes. Reproductive: Uterus and bilateral adnexa are unremarkable. Other: No abdominal wall hernia or abnormality. No abdominopelvic ascites. Postsurgical changes are noted consistent with the recent laparoscopic cholecystectomy. Small fat containing periumbilical hernia is noted. This is stable from the prior exam. Musculoskeletal: No acute or significant osseous findings. Review of the MIP images confirms the above findings. IMPRESSION: CTA of the chest: No pulmonary emboli are noted. Small bilateral pleural effusions with lower lobe consolidation left greater than right. CT of the abdomen and pelvis: Changes consistent with the prior cholecystectomy with a small amount of air in fluid in the gallbladder fossa likely related to the postoperative state. No significant intra-abdominal fluid to suggest bile leak is noted at this time. Changes consistent with acute pancreatitis without evidence of pancreatic necrosis. Correlation with lipase levels is recommended.  Electronically Signed   By: MInez CatalinaM.D.   On: 10/26/2020 03:29   CT ABDOMEN PELVIS W CONTRAST  Result Date: 10/22/2020 CLINICAL DATA:  Abdominal pain. Suspected biliary obstruction. Right-sided abdominal pain with nausea and vomiting. EXAM: CT ABDOMEN AND PELVIS WITH CONTRAST TECHNIQUE: Multidetector CT imaging of the abdomen and pelvis was performed using the standard protocol following bolus administration of intravenous contrast. CONTRAST:  1034mOMNIPAQUE IOHEXOL 300 MG/ML  SOLN COMPARISON:  CT chest 06/10/2018.  CT abdomen and pelvis 06/03/2012 FINDINGS: Lower chest: The lung bases are clear. Hepatobiliary: Mild to moderate intra and extrahepatic bile duct dilatation. No obstructing stone or mass is identified. Suggest MRCP to exclude an occult lesion. Suggestion of vague attenuation change in the gallbladder neck probably representing gallstones. No gallbladder wall thickening or infiltration. No focal liver lesions. Pancreas: Unremarkable. No pancreatic ductal dilatation or surrounding inflammatory changes. Spleen: Normal in size without focal abnormality. Adrenals/Urinary Tract: Adrenal glands are unremarkable. Kidneys are normal, without renal calculi, focal lesion, or hydronephrosis.  Bladder is unremarkable. Stomach/Bowel: Stomach is within normal limits. Appendix appears normal. No evidence of bowel wall thickening, distention, or inflammatory changes. Vascular/Lymphatic: No significant vascular findings are present. No enlarged abdominal or pelvic lymph nodes. Reproductive: Uterus and bilateral adnexa are unremarkable. Other: No free air or free fluid in the abdomen. Several periumbilical hernias containing fat. Musculoskeletal: No acute or significant osseous findings. IMPRESSION: 1. Mild to moderate intra and extrahepatic bile duct dilatation. No obstructing stone or mass is identified. Probable cholelithiasis. Differential diagnosis may include non radiopaque common duct stones, occult  mass, or inflammatory process/stricture. Suggest MRCP to exclude an occult lesion. 2. No evidence of bowel obstruction or inflammation. Normal appendix. 3. Several periumbilical hernias containing fat. Electronically Signed   By: Lucienne Capers M.D.   On: 10/22/2020 00:11   MR 3D Recon At Scanner  Result Date: 10/22/2020 CLINICAL DATA:  Abdominal pain.  Evaluate for biliary obstruction. EXAM: MRI ABDOMEN WITHOUT AND WITH CONTRAST (INCLUDING MRCP) TECHNIQUE: Multiplanar multisequence MR imaging of the abdomen was performed both before and after the administration of intravenous contrast. Heavily T2-weighted images of the biliary and pancreatic ducts were obtained, and three-dimensional MRCP images were rendered by post processing. CONTRAST:  36m GADAVIST GADOBUTROL 1 MMOL/ML IV SOLN COMPARISON:  Ultrasound 10/22/2020 FINDINGS: Lower chest: No acute findings. Hepatobiliary: Mild diffuse hepatic steatosis. Within the limitations of unenhanced technique there is no focal liver abnormality. The gallbladder wall appears mildly thickened measuring up to 4 mm. Stone within the proximal gallbladder measures 1.5 cm. No biliary ductal dilatation. The common bile duct measures 5 mm in diameter. No signs of choledocholithiasis. Pancreas: No mass, inflammatory changes, or other parenchymal abnormality identified. Spleen:  Within normal limits in size and appearance. Adrenals/Urinary Tract: Normal appearance of the adrenal glands. The kidneys are unremarkable. Stomach/Bowel: Visualized portions within the abdomen are unremarkable. Vascular/Lymphatic: No pathologically enlarged lymph nodes identified. No abdominal aortic aneurysm demonstrated. Other:  No free fluid or fluid collections. Musculoskeletal: No suspicious bone lesions identified. IMPRESSION: 1. Gallstones with gallbladder wall thickening. Findings consistent with acute cholecystitis. 2. No biliary ductal dilatation or signs of choledocholithiasis. 3. Mild  diffuse hepatic steatosis. Electronically Signed   By: TKerby MoorsM.D.   On: 10/22/2020 10:06   MR ABDOMEN MRCP W WO CONTAST  Result Date: 10/22/2020 CLINICAL DATA:  Abdominal pain.  Evaluate for biliary obstruction. EXAM: MRI ABDOMEN WITHOUT AND WITH CONTRAST (INCLUDING MRCP) TECHNIQUE: Multiplanar multisequence MR imaging of the abdomen was performed both before and after the administration of intravenous contrast. Heavily T2-weighted images of the biliary and pancreatic ducts were obtained, and three-dimensional MRCP images were rendered by post processing. CONTRAST:  173mGADAVIST GADOBUTROL 1 MMOL/ML IV SOLN COMPARISON:  Ultrasound 10/22/2020 FINDINGS: Lower chest: No acute findings. Hepatobiliary: Mild diffuse hepatic steatosis. Within the limitations of unenhanced technique there is no focal liver abnormality. The gallbladder wall appears mildly thickened measuring up to 4 mm. Stone within the proximal gallbladder measures 1.5 cm. No biliary ductal dilatation. The common bile duct measures 5 mm in diameter. No signs of choledocholithiasis. Pancreas: No mass, inflammatory changes, or other parenchymal abnormality identified. Spleen:  Within normal limits in size and appearance. Adrenals/Urinary Tract: Normal appearance of the adrenal glands. The kidneys are unremarkable. Stomach/Bowel: Visualized portions within the abdomen are unremarkable. Vascular/Lymphatic: No pathologically enlarged lymph nodes identified. No abdominal aortic aneurysm demonstrated. Other:  No free fluid or fluid collections. Musculoskeletal: No suspicious bone lesions identified. IMPRESSION: 1. Gallstones with gallbladder wall thickening. Findings consistent  with acute cholecystitis. 2. No biliary ductal dilatation or signs of choledocholithiasis. 3. Mild diffuse hepatic steatosis. Electronically Signed   By: Kerby Moors M.D.   On: 10/22/2020 10:06   NM HEPATO BILIARY LEAK  Result Date: 10/27/2020 CLINICAL DATA:  Upper  abdominal pain status post cholecystectomy. EXAM: NUCLEAR MEDICINE HEPATOBILIARY IMAGING TECHNIQUE: Sequential images of the abdomen were obtained out to 60 minutes following intravenous administration of radiopharmaceutical. RADIOPHARMACEUTICALS:  5.5 mCi Tc-88m Choletec IV COMPARISON:  CT 10/26/2020 and abdominal sonogram from 10/26/2020 FINDINGS: Prompt uptake and biliary excretion of activity by the liver is seen. Biliary activity passes into small bowel, consistent with patent common bile duct. No extravasation of the radiopharmaceutical to indicate active bowel leak. IMPRESSION: 1. Patent common bile duct without extravasation of the radiopharmaceutical to indicate a bile leak. Electronically Signed   By: TKerby MoorsM.D.   On: 10/27/2020 11:44   [place]Abdomen Limited RUQ (LIVER/GB)  Result Date: 10/26/2020 CLINICAL DATA:  Abdominal pain. Four days status post cholecystectomy. EXAM: ULTRASOUND ABDOMEN LIMITED RIGHT UPPER QUADRANT COMPARISON:  None. FINDINGS: Gallbladder: Surgically absent. A complex postop fluid collection is seen within the gallbladder fossa which measures approximately 7.9 x 3.2 cm. Common bile duct: Diameter: 7 mm, within normal limits post cholecystectomy. Liver: No focal lesion identified. Within normal limits in parenchymal echogenicity. Portal vein is patent on color Doppler imaging with normal direction of blood flow towards the liver. Other: None. IMPRESSION: 7.9 x 3.2 cm complex postop fluid collection within the gallbladder fossa. No evidence of biliary ductal dilatation. Electronically Signed   By: JMarlaine HindM.D.   On: 10/26/2020 12:51   [place]dom[place] Limited RUQ (LIVER/GB)  Result Date: 10/22/2020 CLINICAL DATA:  29year old female with right upper quadrant pain. Nausea vomiting today. Abnormal LFTs. EXAM: ULTRASOUND ABDOMEN LIMITED RIGHT UPPER QUADRANT COMPARISON:  CT Abdomen and Pelvis 2346 hours yesterday. FINDINGS: Gallbladder: Shadowing gallstones (image 4)  individually estimated at up to 15 mm diameter. Partially contracted gallbladder, with wall thickening up to 5 mm. No pericholecystic fluid, but positive sonographic Murphy sign elicited. Common bile duct: Diameter: 7 mm, mildly dilated. No filling defect identified within the visible duct (images 17 through 19). Liver: Liver echogenicity within normal limits. Central intrahepatic biliary ductal dilatation better demonstrated by CT. No discrete liver lesion. Portal vein is patent on color Doppler imaging with normal direction of blood flow towards the liver. Other: Negative visible right kidney. IMPRESSION: 1. Gallstones, gallbladder wall thickening, biliary ductal dilatation (as seen by CT) and positive sonographic Murphy sign. 2. This constellation is suspicious for Choledocholithiasis with possible superimposed Acute Cholecystitis. Electronically Signed   By: HGenevie AnnM.D.   On: 10/22/2020 08:24    Micro Results    Recent Results (from the past 240 hour(s))  Resp Panel by RT-PCR (Flu A&B, Covid) Nasopharyngeal Swab     Status: None   Collection Time: 10/22/20  1:20 AM   Specimen: Nasopharyngeal Swab; Nasopharyngeal(NP) swabs in vial transport medium  Result Value Ref Range Status   SARS Coronavirus 2 by RT PCR NEGATIVE NEGATIVE Final    Comment: (NOTE) SARS-CoV-2 target nucleic acids are NOT DETECTED.  The SARS-CoV-2 RNA is generally detectable in upper respiratory specimens during the acute phase of infection. The lowest concentration of SARS-CoV-2 viral copies this assay can detect is 138 copies/mL. A negative result does not preclude SARS-Cov-2 infection and should not be used as the sole basis for treatment or other patient management decisions. A negative result  may occur with  improper specimen collection/handling, submission of specimen other than nasopharyngeal swab, presence of viral mutation(s) within the areas targeted by this assay, and inadequate number of viral copies(<138  copies/mL). A negative result must be combined with clinical observations, patient history, and epidemiological information. The expected result is Negative.  Fact Sheet for Patients:  EntrepreneurPulse.com.au  Fact Sheet for Healthcare Providers:  IncredibleEmployment.be  This test is no t yet approved or cleared by the [place] FDA and  has been authorized for detection and/or diagnosis of SARS-CoV-2 by FDA under an Emergency Use Authorization (EUA). This EUA will remain  in effect (meaning this test can be used) for the duration of the COVID-19 declaration under Section 564(b)(1) of the Act, 21 U.S.C.section 360bbb-3(b)(1), unless the authorization is terminated  or revoked sooner.       Influenza A by PCR NEGATIVE NEGATIVE Final   Influenza B by PCR NEGATIVE NEGATIVE Final    Comment: (NOTE) The Xpert Xpress SARS-CoV-2/FLU/RSV plus assay is intended as an aid in the diagnosis of influenza from Nasopharyngeal swab specimens and should not be used as a sole basis for treatment. Nasal washings and aspirates are unacceptable for Xpert Xpress SARS-CoV-2/FLU/RSV testing.  Fact Sheet for Patients: EntrepreneurPulse.com.au  Fact Sheet for Healthcare Providers: IncredibleEmployment.be  This test is not yet approved or cleared by the [place] FDA and has been authorized for detection and/or diagnosis of SARS-CoV-2 by FDA under an Emergency Use Authorization (EUA). This EUA will remain in effect (meaning this test can be used) for the duration of the COVID-19 declaration under Section 564(b)(1) of the Act, 21 U.S.C. section 360bbb-3(b)(1), unless the authorization is terminated or revoked.  Performed at [facility], [address], [place], [place] [address]   Resp Panel by RT-PCR (Flu A&B, Covid) Nasopharyngeal Swab     Status: None   Collection Time: 10/26/20  4:15 AM   Specimen: Nasopharyngeal  Swab; Nasopharyngeal(NP) swabs in vial transport medium  Result Value Ref Range Status   SARS Coronavirus 2 by RT PCR NEGATIVE NEGATIVE Final    Comment: (NOTE) SARS-CoV-2 target nucleic acids are NOT DETECTED.  The SARS-CoV-2 RNA is generally detectable in upper respiratory specimens during the acute phase of infection. The lowest concentration of SARS-CoV-2 viral copies this assay can detect is 138 copies/mL. A negative result does not preclude SARS-Cov-2 infection and should not be used as the sole basis for treatment or other patient management decisions. A negative result may occur with  improper specimen collection/handling, submission of specimen other than nasopharyngeal swab, presence of viral mutation(s) within the areas targeted by this assay, and inadequate number of viral copies(<138 copies/mL). A negative result must be combined with clinical observations, patient history, and epidemiological information. The expected result is Negative.  Fact Sheet for Patients:  EntrepreneurPulse.com.au  Fact Sheet for Healthcare Providers:  IncredibleEmployment.be  This test is no t yet approved or cleared by the [place] FDA and  has been authorized for detection and/or diagnosis of SARS-CoV-2 by FDA under an Emergency Use Authorization (EUA). This EUA will remain  in effect (meaning this test can be used) for the duration of the COVID-19 declaration under Section 564(b)(1) of the Act, 21 U.S.C.section 360bbb-3(b)(1), unless the authorization is terminated  or revoked sooner.       Influenza A by PCR NEGATIVE NEGATIVE Final   Influenza B by PCR NEGATIVE NEGATIVE Final    Comment: (NOTE) The Xpert Xpress SARS-CoV-2/FLU/RSV plus assay is intended as an  aid in the diagnosis of influenza from Nasopharyngeal swab specimens and should not be used as a sole basis for treatment. Nasal washings and aspirates are unacceptable for Xpert Xpress  SARS-CoV-2/FLU/RSV testing.  Fact Sheet for Patients: EntrepreneurPulse.com.au  Fact Sheet for Healthcare Providers: IncredibleEmployment.be  This test is not yet approved or cleared by the [place] FDA and has been authorized for detection and/or diagnosis of SARS-CoV-2 by FDA under an Emergency Use Authorization (EUA). This EUA will remain in effect (meaning this test can be used) for the duration of the COVID-19 declaration under Section 564(b)(1) of the Act, 21 U.S.C. section 360bbb-3(b)(1), unless the authorization is terminated or revoked.  Performed at [facility], [address], [place], [place] [address]   Culture, blood (routine x 2) Call MD if unable to obtain prior to antibiotics being given     Status: None (Preliminary result)   Collection Time: 10/26/20  6:47 AM   Specimen: BLOOD  Result Value Ref Range Status   Specimen Description BLOOD RIGHT ANTECUBITAL  Final   Special Requests   Final    BOTTLES DRAWN AEROBIC AND ANAEROBIC Blood Culture results may not be optimal due to an inadequate volume of blood received in culture bottles   Culture   Final    NO GROWTH 4 DAYS Performed at [facility], [address], [place], [place] [address]    Report Status PENDING  Incomplete  Culture, blood (routine x 2) Call MD if unable to obtain prior to antibiotics being given     Status: None (Preliminary result)   Collection Time: 10/26/20  6:47 AM   Specimen: BLOOD  Result Value Ref Range Status   Specimen Description BLOOD LEFT ANTECUBITAL  Final   Special Requests   Final    BOTTLES DRAWN AEROBIC AND ANAEROBIC Blood Culture adequate volume   Culture   Final    NO GROWTH 4 DAYS Performed at [facility], [address] [place], [place] [address]    Report Status PENDING  Incomplete  Culture, blood (Routine X 2) w Reflex to ID Panel     Status: None (Preliminary result)   Collection Time: 10/27/20 11:09 PM   Specimen: BLOOD LEFT  HAND  Result Value Ref Range Status   Specimen Description BLOOD LEFT HAND  Final   Special Requests   Final    BOTTLES DRAWN AEROBIC AND ANAEROBIC Blood Culture adequate volume   Culture   Final    NO GROWTH 3 DAYS Performed at [facility], [address] [place], [place] [address]    Report Status PENDING  Incomplete  Culture, blood (Routine X 2) w Reflex to ID Panel     Status: None (Preliminary result)   Collection Time: 10/27/20 11:14 PM   Specimen: BLOOD LEFT HAND  Result Value Ref Range Status   Specimen Description BLOOD LEFT HAND  Final   Special Requests   Final    BOTTLES DRAWN AEROBIC AND ANAEROBIC Blood Culture adequate volume   Culture   Final    NO GROWTH 3 DAYS Performed at [facility], [address], [place], [place] [address]    Report Status PENDING  Incomplete       Today   Subjective    Samarie Pinder today has no new complaints No fever  Or chills   No Nausea, Vomiting or Diarrhea     Eating and drinking well, having BMs,  Patient has been seen and examined prior to discharge   Objective  Blood pressure 105/63, pulse 87, temperature 98.4 F (36.9 C), temperature source Oral, resp. rate 18, height 5' 7"  (1.702 m), weight (!) 156.9 kg, last menstrual period 10/27/2020, SpO2 98 %, not currently breastfeeding.   Intake/Output Summary (Last 24 hours) at 10/30/2020 1146 Last data filed at 10/30/2020 0625 Gross per 24 hour  Intake 480 ml  Output --  Net 480 ml    Exam Gen:- Awake Alert, no acute distress  HEENT:- [place].AT, No sclera icterus Neck-Supple Neck,No JVD,.  Lungs-improved air movement, no wheeze  CV- S1, S2 normal, regular Abd-  +ve B.Sounds, Abd Soft, appropriate postop tenderness, increased truncal adiposity noted Extremity/Skin:- No  edema,   good pulses Psych-affect is appropriate, oriented x3 Neuro-no new focal deficits, no tremors    Data Review   CBC w Diff:  Lab Results  Component Value Date   WBC 10.2 10/29/2020    HGB 10.1 (L) 10/29/2020   HGB 10.1 (L) 06/16/2019   HCT 32.7 (L) 10/29/2020   HCT 31.1 (L) 06/16/2019   PLT 263 10/29/2020   PLT 198 06/16/2019   LYMPHOPCT 36 06/10/2018   MONOPCT 7 06/10/2018   EOSPCT 2 06/10/2018   BASOPCT 1 06/10/2018    CMP:  Lab Results  Component Value Date   NA 135 10/29/2020   K 3.9 10/29/2020   CL 102 10/29/2020   CO2 25 10/29/2020   BUN 5 (L) 10/29/2020   CREATININE 0.76 10/29/2020   PROT 6.7 10/29/2020   ALBUMIN 2.7 (L) 10/29/2020   BILITOT 1.0 10/29/2020   ALKPHOS 109 10/29/2020   AST 23 10/29/2020   ALT 56 (H) 10/29/2020  .   Total Discharge time is about 33 minutes  Roxan Hockey M.D on 10/30/2020 at 11:46 AM  Go to [email or web address] -  for contact info  [facility] - Office  [phone]

## 2020-10-31 LAB — CULTURE, BLOOD (ROUTINE X 2)
Culture: NO GROWTH
Culture: NO GROWTH
Special Requests: ADEQUATE

## 2020-11-01 ENCOUNTER — Telehealth: Payer: Self-pay

## 2020-11-01 LAB — CULTURE, BLOOD (ROUTINE X 2)
Culture: NO GROWTH
Culture: NO GROWTH
Special Requests: ADEQUATE
Special Requests: ADEQUATE

## 2020-11-01 NOTE — Telephone Encounter (Signed)
Transition Care Management Unsuccessful Follow-up Telephone Call  Date of discharge and from where:  10/30/2020 [facility] [name]   Attempts:  1st Attempt  Reason for unsuccessful TCM follow-up call:  Left voice message

## 2020-11-02 NOTE — Telephone Encounter (Signed)
Transition Care Management Unsuccessful Follow-up Telephone Call  Date of discharge and from where:  10/30/2020 Patricia [name]   Attempts:  2nd Attempt  Reason for unsuccessful TCM follow-up call:  Left voice message

## 2020-11-03 NOTE — Telephone Encounter (Signed)
Transition Care Management Unsuccessful Follow-up Telephone Call  Date of discharge and from where:  10/30/2020 Patricia [name]            Attempts:  3rd Attempt  Reason for unsuccessful TCM follow-up call:  Left voice message

## 2020-11-04 ENCOUNTER — Telehealth (INDEPENDENT_AMBULATORY_CARE_PROVIDER_SITE_OTHER): Payer: 59 | Admitting: General Surgery

## 2020-11-04 DIAGNOSIS — K851 Biliary acute pancreatitis without necrosis or infection: Secondary | ICD-10-CM

## 2020-11-04 NOTE — Progress Notes (Signed)
[facility]  I am calling the patient for post operative evaluation. This is not a billable encounter as it is under the global charges for the surgery.  The patient had a laparoscopic cholecystectomy on 10/22/2020. The patient reports that doing ok but is having pain shooting into her back to the left side.  The are tolerating a diet, having good abdominal pain control, and having regular Bms but they are loose.  The incisions are healing. She did have complication with aspiration pneumonia and pancreatitis.   Pathology: FINAL MICROSCOPIC DIAGNOSIS:   A. GALLBLADDER, CHOLECYSTECTOMY:  - Chronic cholecystitis.  - Cholelithiasis.  - Reactive lymph node.   Given the back pain will check repeat labs since she did have pancreatitis post op.  Will call with the results.   Algis Greenhouse, MD [facility] [address] [name], [place] [address] [phone] (office)

## 2020-11-05 ENCOUNTER — Other Ambulatory Visit: Payer: Self-pay | Admitting: Family Medicine

## 2020-11-05 DIAGNOSIS — K851 Biliary acute pancreatitis without necrosis or infection: Secondary | ICD-10-CM

## 2020-11-10 ENCOUNTER — Telehealth: Payer: Self-pay | Admitting: Family Medicine

## 2020-11-10 NOTE — Telephone Encounter (Signed)
Called pt to inquire if there was an issue with her getting her blood work done or if she got it done somewhere else that we can not see in computer has to [facility].

## 2020-11-16 NOTE — Telephone Encounter (Signed)
Called pt - [id number] 

## 2020-11-23 NOTE — Telephone Encounter (Signed)
Mychart message sent to pt about labs

## 2020-11-27 NOTE — L&D Delivery Note (Signed)
Delivery Note/precipitous delivery At 8:35 PM a viable female was delivered via Vaginal, Spontaneous (Presentation: Left Occiput Anterior).  APGAR: 8, 8; weight  .   Placenta status: Spontaneous, Intact.  Cord: 3 vessels with the following complications: None.  Cord pH: not done  Anesthesia: None Episiotomy: None Lacerations: None Suture Repair:  Est. Blood Loss (mL):  200 cc  Mom to postpartum.  Baby to Couplet care / Skin to Skin.  Patricia [name] 07/27/2021, 8:55 PM

## 2020-12-02 ENCOUNTER — Ambulatory Visit (INDEPENDENT_AMBULATORY_CARE_PROVIDER_SITE_OTHER): Payer: 59 | Admitting: Adult Health

## 2020-12-02 ENCOUNTER — Other Ambulatory Visit: Payer: Self-pay | Admitting: Adult Health

## 2020-12-02 ENCOUNTER — Other Ambulatory Visit: Payer: Self-pay

## 2020-12-02 ENCOUNTER — Encounter: Payer: Self-pay | Admitting: Adult Health

## 2020-12-02 VITALS — BP 139/85 | HR 98 | Ht 67.0 in | Wt 339.5 lb

## 2020-12-02 DIAGNOSIS — Z3201 Encounter for pregnancy test, result positive: Secondary | ICD-10-CM | POA: Insufficient documentation

## 2020-12-02 DIAGNOSIS — O3680X0 Pregnancy with inconclusive fetal viability, not applicable or unspecified: Secondary | ICD-10-CM

## 2020-12-02 DIAGNOSIS — Z9851 Tubal ligation status: Secondary | ICD-10-CM | POA: Insufficient documentation

## 2020-12-02 LAB — POCT URINE PREGNANCY: Preg Test, Ur: POSITIVE — AB

## 2020-12-02 MED ORDER — PRENATAL PLUS 27-1 MG PO TABS: 1.0000 | ORAL_TABLET | Freq: Every day | ORAL | 0 refills | Status: DC

## 2020-12-02 NOTE — Progress Notes (Signed)
  Subjective:     Patient ID: Patricia [name], female   DOB: 05-Oct-1991, 30 y.o.   MRN: [id number]  HPI Patricia [name] is a 30 year old black female,single, in for UPT, she had missed a period and had 3+HPTs. She had tubal ligation 09/15/2019 after delivery. She had Gallbladder surgery 10/22/28 and had sex 12/10,13 and 15.  PCP is Tresa Res NP  [name] Had missed a period and had 3+HPTs +nausea She denies any pain or bleeding  Reviewed past medical,surgical, social and family history. Reviewed medications and allergies.     Objective:   Physical Exam BP 139/85 (BP Location: Left Arm, Patient Position: Sitting, Cuff Size: Large)   Pulse 98   Ht 5\' 7"  (1.702 m)   Wt (!) 339 lb 8 oz (154 kg)   LMP 10/25/2020   Breastfeeding No   BMI 53.17 kg/m UPT is +, about 5+3 weeks by LPM with EDD 08/01/21. Skin warm and dry.  Lungs: clear to ausculation bilaterally. Cardiovascular: regular rate and rhythm.   Abdomen is soft and non tender Fall risk is low  Upstream - 12/02/20 1203      Pregnancy Intention Screening   Does the patient want to become pregnant in the next year? N/A    Does the patient's partner want to become pregnant in the next year? N/A    Would the patient like to discuss contraceptive options today? N/A      Contraception Wrap Up   Current Method Female Sterilization    End Method Female Sterilization   had +UPT today   Contraception Counseling Provided No         Blood type is A-.  Assessment:     1. Pregnancy examination or test, positive result Check QHCG,CBC and CMP, will talk in am Will get 01/30/21  ASAP, tentatively 12/10/20 in the office for position of pregnancy   Meds ordered this encounter  Medications  . prenatal vitamin w/FE, FA (PRENATAL 1 + 1) 27-1 MG TABS tablet    Sig: Take 1 tablet by mouth daily at 12 noon.    Dispense:  30 tablet    Refill:  0    Order Specific Question:   Supervising Provider    Answer:   12/12/20, LUTHER H [[id number]]    2.  S/P tubal ligation Discussed there is concern for ectopic since sp tubal ligation If has any bleeding or pain go to [facility]U She is not sure if pregnancy is IUP if she will continue or not    Plan:     Will talk in am

## 2020-12-03 ENCOUNTER — Ambulatory Visit (INDEPENDENT_AMBULATORY_CARE_PROVIDER_SITE_OTHER): Payer: 59

## 2020-12-03 ENCOUNTER — Other Ambulatory Visit: Payer: Self-pay | Admitting: Adult Health

## 2020-12-03 DIAGNOSIS — O3680X0 Pregnancy with inconclusive fetal viability, not applicable or unspecified: Secondary | ICD-10-CM

## 2020-12-03 DIAGNOSIS — Z3A01 Less than 8 weeks gestation of pregnancy: Secondary | ICD-10-CM

## 2020-12-03 LAB — CBC
Hematocrit: 34.2 % (ref 34.0–46.6)
Hemoglobin: 11 g/dL — ABNORMAL LOW (ref 11.1–15.9)
MCH: 26.7 pg (ref 26.6–33.0)
MCHC: 32.2 g/dL (ref 31.5–35.7)
MCV: 83 fL (ref 79–97)
Platelets: 231 10*3/uL (ref 150–450)
RBC: 4.12 x10E6/uL (ref 3.77–5.28)
RDW: 14.3 % (ref 11.7–15.4)
WBC: 6.9 10*3/uL (ref 3.4–10.8)

## 2020-12-03 LAB — COMPREHENSIVE METABOLIC PANEL WITH GFR
ALT: 31 IU/L (ref 0–32)
AST: 22 IU/L (ref 0–40)
Albumin/Globulin Ratio: 1.5 (ref 1.2–2.2)
Albumin: 4 g/dL (ref 3.9–5.0)
Alkaline Phosphatase: 70 IU/L (ref 44–121)
BUN/Creatinine Ratio: 6 — ABNORMAL LOW (ref 9–23)
BUN: 6 mg/dL (ref 6–20)
Bilirubin Total: 0.6 mg/dL (ref 0.0–1.2)
CO2: 20 mmol/L (ref 20–29)
Calcium: 8.9 mg/dL (ref 8.7–10.2)
Chloride: 105 mmol/L (ref 96–106)
Creatinine, Ser: 1.01 mg/dL — ABNORMAL HIGH (ref 0.57–1.00)
GFR calc Af Amer: 88 mL/min/1.73
GFR calc non Af Amer: 76 mL/min/1.73
Globulin, Total: 2.7 g/dL (ref 1.5–4.5)
Glucose: 113 mg/dL — ABNORMAL HIGH (ref 65–99)
Potassium: 3.7 mmol/L (ref 3.5–5.2)
Sodium: 138 mmol/L (ref 134–144)
Total Protein: 6.7 g/dL (ref 6.0–8.5)

## 2020-12-03 LAB — BETA HCG QUANT (REF LAB): hCG Quant: 1094 m[IU]/mL

## 2020-12-03 NOTE — Progress Notes (Addendum)
[place] TA/TV: 5+2 wks GS no YS or fetal pole visualized ,GS 5.2 mm,normal ovaries,discussed results with Jennifer,pt will come back for f/u ultrasound in 7 days

## 2020-12-09 ENCOUNTER — Other Ambulatory Visit: Payer: Self-pay | Admitting: Obstetrics & Gynecology

## 2020-12-09 DIAGNOSIS — O3680X0 Pregnancy with inconclusive fetal viability, not applicable or unspecified: Secondary | ICD-10-CM

## 2020-12-10 ENCOUNTER — Other Ambulatory Visit: Payer: 59

## 2020-12-10 ENCOUNTER — Other Ambulatory Visit: Payer: Self-pay

## 2020-12-10 ENCOUNTER — Ambulatory Visit (INDEPENDENT_AMBULATORY_CARE_PROVIDER_SITE_OTHER): Payer: 59

## 2020-12-10 DIAGNOSIS — O3680X0 Pregnancy with inconclusive fetal viability, not applicable or unspecified: Secondary | ICD-10-CM | POA: Diagnosis not present

## 2020-12-10 DIAGNOSIS — Z3A01 Less than 8 weeks gestation of pregnancy: Secondary | ICD-10-CM | POA: Diagnosis not present

## 2020-12-10 NOTE — Progress Notes (Signed)
[place] 6+2 wks,single IUP with YS,positive fht 104 bpm,normal ovaries,crl 3.98 mm

## 2021-01-20 ENCOUNTER — Ambulatory Visit: Payer: 59 | Admitting: *Deleted

## 2021-01-20 ENCOUNTER — Encounter: Payer: 59 | Admitting: Advanced Practice Midwife

## 2021-01-20 ENCOUNTER — Other Ambulatory Visit: Payer: 59

## 2021-01-21 ENCOUNTER — Ambulatory Visit (INDEPENDENT_AMBULATORY_CARE_PROVIDER_SITE_OTHER): Payer: 59 | Admitting: Obstetrics and Gynecology

## 2021-01-21 ENCOUNTER — Encounter: Payer: Self-pay | Admitting: Obstetrics and Gynecology

## 2021-01-21 ENCOUNTER — Other Ambulatory Visit: Payer: Self-pay

## 2021-01-21 ENCOUNTER — Other Ambulatory Visit (HOSPITAL_COMMUNITY)
Admission: RE | Admit: 2021-01-21 | Discharge: 2021-01-21 | Disposition: A | Discharge status: Home / Self Care | Payer: 59 | Source: Ambulatory Visit | Attending: Obstetrics and Gynecology | Admitting: Obstetrics and Gynecology

## 2021-01-21 DIAGNOSIS — O26891 Other specified pregnancy related conditions, first trimester: Secondary | ICD-10-CM | POA: Diagnosis not present

## 2021-01-21 DIAGNOSIS — Z9049 Acquired absence of other specified parts of digestive tract: Secondary | ICD-10-CM | POA: Insufficient documentation

## 2021-01-21 DIAGNOSIS — N898 Other specified noninflammatory disorders of vagina: Secondary | ICD-10-CM | POA: Insufficient documentation

## 2021-01-21 NOTE — Patient Instructions (Signed)
Obstetrics: Normal and Problem Pregnancies (7th ed., pp. 102-121). [place], [place]: Elsevier."> Textbook of Family Medicine (9th ed., pp. 365-410). [place], [place]: Elsevier [employer].">  First Trimester of Pregnancy  The first trimester of pregnancy starts on the first day of your last menstrual period until the end of week 12. This is months 1 through 3 of pregnancy. A week after a sperm fertilizes an egg, the egg will implant into the wall of the uterus and begin to develop into a baby. By the end of 12 weeks, all the baby's organs will be formed and the baby will be 2-3 inches in size. Body changes during your first trimester Your body goes through many changes during pregnancy. The changes vary and generally return to normal after your baby is born. Physical changes  You may gain or lose weight.  Your breasts may begin to grow larger and become tender. The tissue that surrounds your nipples (areola) may become darker.  Dark spots or blotches (chloasma or mask of pregnancy) may develop on your face.  You may have changes in your hair. These can include thickening or thinning of your hair or changes in texture. Health changes  You may feel nauseous, and you may vomit.  You may have heartburn.  You may develop headaches.  You may develop constipation.  Your gums may bleed and may be sensitive to brushing and flossing. Other changes  You may tire easily.  You may urinate more often.  Your menstrual periods will stop.  You may have a loss of appetite.  You may develop cravings for certain kinds of food.  You may have changes in your emotions from day to day.  You may have more vivid and strange dreams. Follow these instructions at home: Medicines  Follow your health care provider's instructions regarding medicine use. Specific medicines may be either safe or unsafe to take during pregnancy. Do not take any medicines unless told to by your health care provider.  Take a  prenatal vitamin that contains at least 600 micrograms (mcg) of folic acid. Eating and drinking  Eat a healthy diet that includes fresh fruits and vegetables, whole grains, good sources of protein such as meat, eggs, or tofu, and low-fat dairy products.  Avoid raw meat and unpasteurized juice, milk, and cheese. These carry germs that can harm you and your baby.  If you feel nauseous or you vomit: ? Eat 4 or 5 small meals a day instead of 3 large meals. ? Try eating a few soda crackers. ? Drink liquids between meals instead of during meals.  You may need to take these actions to prevent or treat constipation: ? Drink enough fluid to keep your urine pale yellow. ? Eat foods that are high in fiber, such as beans, whole grains, and fresh fruits and vegetables. ? Limit foods that are high in fat and processed sugars, such as fried or sweet foods. Activity  Exercise only as directed by your health care provider. Most people can continue their usual exercise routine during pregnancy. Try to exercise for 30 minutes at least 5 days a week.  Stop exercising if you develop pain or cramping in the lower abdomen or lower back.  Avoid exercising if it is very hot or humid or if you are at high altitude.  Avoid heavy lifting.  If you choose to, you may have sex unless your health care provider tells you not to. Relieving pain and discomfort  Wear a good support bra to relieve breast   tenderness.  Rest with your legs elevated if you have leg cramps or low back pain.  If you develop bulging veins (varicose veins) in your legs: ? Wear support hose as told by your health care provider. ? Elevate your feet for 15 minutes, 3-4 times a day. ? Limit salt in your diet. Safety  Wear your seat belt at all times when driving or riding in a car.  Talk with your health care provider if someone is verbally or physically abusive to you.  Talk with your health care provider if you are feeling sad or have  thoughts of hurting yourself. Lifestyle  Do not use hot tubs, steam rooms, or saunas.  Do not douche. Do not use tampons or scented sanitary pads.  Do not use herbal remedies, alcohol, illegal drugs, or medicines that are not approved by your health care provider. Chemicals in these products can harm your baby.  Do not use any products that contain nicotine or tobacco, such as cigarettes, e-cigarettes, and chewing tobacco. If you need help quitting, ask your health care provider.  Avoid cat litter boxes and soil used by cats. These carry germs that can cause birth defects in the baby and possibly loss of the unborn baby (fetus) by miscarriage or stillbirth. General instructions  During routine prenatal visits in the first trimester, your health care provider will do a physical exam, perform necessary tests, and ask you how things are going. Keep all follow-up visits. This is important.  Ask for help if you have counseling or nutritional needs during pregnancy. Your health care provider can offer advice or refer you to specialists for help with various needs.  Schedule a dentist appointment. At home, brush your teeth with a soft toothbrush. Floss gently.  Write down your questions. Take them to your prenatal visits. Where to find more information  American Pregnancy Association: americanpregnancy.org  American College of Obstetricians and Gynecologists: acog.org/en/Womens%20Health/Pregnancy  Office on Women's Health: womenshealth.gov/pregnancy Contact a health care provider if you have:  Dizziness.  A fever.  Mild pelvic cramps, pelvic pressure, or nagging pain in the abdominal area.  Nausea, vomiting, or diarrhea that lasts for 24 hours or longer.  A bad-smelling vaginal discharge.  Pain when you urinate.  Known exposure to a contagious illness, such as chickenpox, measles, Zika virus, HIV, or hepatitis. Get help right away if you have:  Spotting or bleeding from your  vagina.  Severe abdominal cramping or pain.  Shortness of breath or chest pain.  Any kind of trauma, such as from a fall or a car crash.  New or increased pain, swelling, or redness in an arm or leg. Summary  The first trimester of pregnancy starts on the first day of your last menstrual period until the end of week 12 (months 1 through 3).  Eating 4 or 5 small meals a day rather than 3 large meals may help to relieve nausea and vomiting.  Do not use any products that contain nicotine or tobacco, such as cigarettes, e-cigarettes, and chewing tobacco. If you need help quitting, ask your health care provider.  Keep all follow-up visits. This is important. This information is not intended to replace advice given to you by your health care provider. Make sure you discuss any questions you have with your health care provider. Document Revised: 04/21/2020 Document Reviewed: 02/26/2020 Elsevier Patient Education  2021 Elsevier Inc.  

## 2021-01-21 NOTE — Progress Notes (Signed)
Pt presents as a OB problem visit Has not had new OB appt yet. Pt noted yellow/green vaginal discharge yesterday.  PE AF VSS FHT + by U/S  Lungs clear Heart RRR Abd soft + BS  GU nl EGBUS, vaginal discharge, swab collected  A/P Vaginal discharge in pregnancy  Await vaginal swab results F/U with New OB appt.

## 2021-01-24 ENCOUNTER — Emergency Department (HOSPITAL_COMMUNITY)
Admission: EM | Admit: 2021-01-24 | Discharge: 2021-01-24 | Disposition: A | Discharge status: Home / Self Care | Payer: 59 | Attending: Emergency Medicine | Admitting: Emergency Medicine

## 2021-01-24 ENCOUNTER — Other Ambulatory Visit: Payer: Self-pay

## 2021-01-24 ENCOUNTER — Emergency Department (HOSPITAL_COMMUNITY): Payer: 59

## 2021-01-24 ENCOUNTER — Encounter (HOSPITAL_COMMUNITY): Payer: Self-pay

## 2021-01-24 DIAGNOSIS — O24419 Gestational diabetes mellitus in pregnancy, unspecified control: Secondary | ICD-10-CM | POA: Diagnosis not present

## 2021-01-24 DIAGNOSIS — R1011 Right upper quadrant pain: Secondary | ICD-10-CM | POA: Diagnosis not present

## 2021-01-24 DIAGNOSIS — B9689 Other specified bacterial agents as the cause of diseases classified elsewhere: Secondary | ICD-10-CM | POA: Diagnosis not present

## 2021-01-24 DIAGNOSIS — O26891 Other specified pregnancy related conditions, first trimester: Secondary | ICD-10-CM | POA: Diagnosis not present

## 2021-01-24 DIAGNOSIS — R8271 Bacteriuria: Secondary | ICD-10-CM

## 2021-01-24 DIAGNOSIS — R109 Unspecified abdominal pain: Secondary | ICD-10-CM

## 2021-01-24 DIAGNOSIS — Z9049 Acquired absence of other specified parts of digestive tract: Secondary | ICD-10-CM | POA: Diagnosis not present

## 2021-01-24 DIAGNOSIS — O2390 Unspecified genitourinary tract infection in pregnancy, unspecified trimester: Secondary | ICD-10-CM | POA: Diagnosis not present

## 2021-01-24 DIAGNOSIS — O2691 Pregnancy related conditions, unspecified, first trimester: Secondary | ICD-10-CM | POA: Diagnosis not present

## 2021-01-24 DIAGNOSIS — Z3A12 12 weeks gestation of pregnancy: Secondary | ICD-10-CM | POA: Insufficient documentation

## 2021-01-24 DIAGNOSIS — O2391 Unspecified genitourinary tract infection in pregnancy, first trimester: Secondary | ICD-10-CM | POA: Diagnosis not present

## 2021-01-24 LAB — CBC WITH DIFFERENTIAL/PLATELET
Abs Immature Granulocytes: 0.02 10*3/uL (ref 0.00–0.07)
Basophils Absolute: 0 10*3/uL (ref 0.0–0.1)
Basophils Relative: 0 %
Eosinophils Absolute: 0 10*3/uL (ref 0.0–0.5)
Eosinophils Relative: 1 %
HCT: 32.9 % — ABNORMAL LOW (ref 36.0–46.0)
Hemoglobin: 10.6 g/dL — ABNORMAL LOW (ref 12.0–15.0)
Immature Granulocytes: 0 %
Lymphocytes Relative: 38 %
Lymphs Abs: 1.8 10*3/uL (ref 0.7–4.0)
MCH: 27.5 pg (ref 26.0–34.0)
MCHC: 32.2 g/dL (ref 30.0–36.0)
MCV: 85.2 fL (ref 80.0–100.0)
Monocytes Absolute: 0.4 10*3/uL (ref 0.1–1.0)
Monocytes Relative: 8 %
Neutro Abs: 2.4 10*3/uL (ref 1.7–7.7)
Neutrophils Relative %: 53 %
Platelets: 180 10*3/uL (ref 150–400)
RBC: 3.86 MIL/uL — ABNORMAL LOW (ref 3.87–5.11)
RDW: 13.7 % (ref 11.5–15.5)
WBC: 4.6 10*3/uL (ref 4.0–10.5)
nRBC: 0 % (ref 0.0–0.2)

## 2021-01-24 LAB — URINALYSIS, ROUTINE W REFLEX MICROSCOPIC
Bilirubin Urine: NEGATIVE
Glucose, UA: NEGATIVE mg/dL
Hgb urine dipstick: NEGATIVE
Ketones, ur: 5 mg/dL — AB
Nitrite: NEGATIVE
Protein, ur: NEGATIVE mg/dL
Specific Gravity, Urine: 1.024 (ref 1.005–1.030)
pH: 6 (ref 5.0–8.0)

## 2021-01-24 LAB — COMPREHENSIVE METABOLIC PANEL
ALT: 14 U/L (ref 0–44)
AST: 13 U/L — ABNORMAL LOW (ref 15–41)
Albumin: 3.1 g/dL — ABNORMAL LOW (ref 3.5–5.0)
Alkaline Phosphatase: 36 U/L — ABNORMAL LOW (ref 38–126)
Anion gap: 10 (ref 5–15)
BUN: 9 mg/dL (ref 6–20)
CO2: 21 mmol/L — ABNORMAL LOW (ref 22–32)
Calcium: 8.9 mg/dL (ref 8.9–10.3)
Chloride: 104 mmol/L (ref 98–111)
Creatinine, Ser: 0.69 mg/dL (ref 0.44–1.00)
GFR, Estimated: >60 mL/min (ref >60)
Glucose, Bld: 98 mg/dL (ref 70–99)
Potassium: 3.7 mmol/L (ref 3.5–5.1)
Sodium: 135 mmol/L (ref 135–145)
Total Bilirubin: 0.6 mg/dL (ref 0.3–1.2)
Total Protein: 6.3 g/dL — ABNORMAL LOW (ref 6.5–8.1)

## 2021-01-24 LAB — LIPASE, BLOOD: Lipase: 24 U/L (ref 11–51)

## 2021-01-24 LAB — PREGNANCY, URINE: Preg Test, Ur: POSITIVE — AB

## 2021-01-24 MED ORDER — ACETAMINOPHEN 325 MG PO TABS
650.0000 mg | ORAL_TABLET | Freq: Once | ORAL | Status: AC
  Administered 2021-01-24: 650 mg via ORAL
  Filled 2021-01-24: qty 2

## 2021-01-24 MED ORDER — CEFPODOXIME PROXETIL 100 MG PO TABS
100.0000 mg | ORAL_TABLET | Freq: Two times a day (BID) | ORAL | 0 refills | Status: AC
Start: 2021-01-24 — End: 2021-01-29

## 2021-01-24 NOTE — ED Notes (Signed)
Pt was given cup of water at 0855 to drink. Pt asked again if she could give urine sample, but pt still reports she isn't able to at this time.

## 2021-01-24 NOTE — ED Triage Notes (Signed)
Pt presents to ED with complaints of upper right abdominal pain since Wednesday. Pt denies N/V/D. Pt is [redacted] weeks pregnant.

## 2021-01-24 NOTE — Discharge Instructions (Addendum)
You have been seen and discharged from the emergency department.  Follow-up with your primary provider and OB/GYN for reevaluation. Your urine had bacteria in it, we will treat this as a presumed UTI. Take antibiotic as directed. Take home medications as prescribed. If you have any worsening symptoms or further concerns for health please return to an emergency department for further evaluation.

## 2021-01-24 NOTE — ED Provider Notes (Signed)
[facility] EMERGENCY DEPARTMENT Provider Note   CSN: [id number] Arrival date & time: 01/24/21  3419     History Chief Complaint  Patient presents with  . Abdominal Pain    Patricia [name] is a 30 y.o. female.  HPI   30 year old female with past medical history of UTIs, cholecystectomy, gestational diabetes, STDs, tubal ligation with current approximately 12-week IUP pregnancy presents to the emergency department with right upper quadrant abdominal discomfort.  Patient states this started last night's, was present through the night and this morning.  It is a sharp/crampy sensation in her right upper quadrant.  She denies any nausea/vomiting/diarrhea.  She is currently following with [facility], recently had an ultrasound that identified an IUP without any complications.  She denies any abnormal vaginal bleeding, discharge, dysuria, hematuria.  No lower abdominal pain/pelvic pain.  Past Medical History:  Diagnosis Date  . Anemia   . Anxiety   . Bacterial vaginosis   . Chlamydia   . Depression    PP depression after 3rd pregnancy - resolved with therarpy  . Migraine   . Preterm labor   . Preterm labor without delivery in second trimester June 2012   Positive FFN in June  . UTI (lower urinary tract infection)     Patient Active Problem List   Diagnosis Date Noted  . History of laparoscopic cholecystectomy 01/21/2021  . Vaginal discharge during pregnancy in first trimester 01/21/2021  . S/P tubal ligation 12/02/2020  . Pregnancy examination or test, positive result 12/02/2020  . Pneumonia 10/26/2020  . History of anxiety 10/22/2020  . Morbid obesity with BMI of 50.0-59.9, adult (HCC) 10/22/2020  . 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 Transaminasemia   . History of preterm delivery 11/08/2015  . 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 History of gestational diabetes 11/08/2015  . 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 Obesity 04/01/2013     Past Surgical History:  Procedure Laterality Date  . CHOLECYSTECTOMY N/A 10/22/2020   Procedure: LAPAROSCOPIC CHOLECYSTECTOMY;  Surgeon: Lucretia Roers, MD;  Location: [facility] ORS;  Service: General;  Laterality: N/A;  . NO PAST SURGERIES    . TUBAL LIGATION N/A 09/15/2019   Procedure: POST PARTUM TUBAL LIGATION;  Surgeon: Lazaro Arms, MD;  Location: [facility] LD ORS;  Service: Gynecology;  Laterality: N/A;     OB History    Gravida  6   Para  4   Term  3   Preterm  1   AB  1   Living  4     SAB  1   IAB      Ectopic      Multiple  0   Live Births  4           Family History  Problem Relation Age of Onset  . Diabetes Other        great granmother  . Diabetes Paternal Grandfather   . Hypertension Paternal Grandfather   . Cancer Paternal Grandfather        prostate  . Miscarriages / Stillbirths Maternal Grandmother     Social History   Tobacco Use  . Smoking status: Never Smoker  . Smokeless tobacco: Never Used  Vaping Use  . Vaping Use: Never used  Substance Use Topics  . Alcohol use: Not Currently  . Drug use: No    Home Medications Prior to Admission medications   Medication Sig Start Date End Date Taking? Authorizing Provider  acetaminophen (TYLENOL) 325  MG tablet Take 2 tablets (650 mg total) by mouth every 6 (six) hours as needed for fever. 10/30/20  Yes Emokpae, Courage, MD  prenatal vitamin w/FE, FA (PRENATAL 1 + 1) 27-1 MG TABS tablet Take 1 tablet by mouth daily at 12 noon. 12/02/20  Yes Adline Potter, NP    Allergies    Patient has no known allergies.  [name]   [name]  Constitutional: Negative for chills and fever.  HENT: Negative for congestion.   Eyes: Negative for visual disturbance.  Respiratory: Negative for shortness of breath.   Cardiovascular: Negative for chest pain.  Gastrointestinal: Negative for abdominal pain, diarrhea and vomiting.  Genitourinary: Negative for difficulty urinating, dysuria,  frequency, genital sores, pelvic pain, vaginal bleeding, vaginal discharge and vaginal pain.  Skin: Negative for rash.  Neurological: Negative for headaches.    Physical Exam Updated Vital Signs BP 113/68   Pulse 84   Temp 98.5 F (36.9 C) (Oral)   Resp 18   Ht 5\' 7"  (1.702 m)   Wt (!) 149.2 kg   LMP 10/27/2020 Comment: tubal  SpO2 99%   BMI 51.53 kg/m   Physical Exam Vitals and nursing note reviewed.  Constitutional:      Appearance: Normal appearance.  HENT:     Head: Normocephalic.     Mouth/Throat:     Mouth: Mucous membranes are moist.  Cardiovascular:     Rate and Rhythm: Normal rate.  Pulmonary:     Effort: Pulmonary effort is normal. No respiratory distress.  Abdominal:     Palpations: Abdomen is soft.     Tenderness: There is abdominal tenderness in the right upper quadrant and epigastric area.  Skin:    General: Skin is warm.  Neurological:     Mental Status: She is alert and oriented to person, place, and time. Mental status is at baseline.  Psychiatric:        Mood and Affect: Mood normal.     ED Results / Procedures / Treatments   Labs (all labs ordered are listed, but only abnormal results are displayed) Labs Reviewed  CBC WITH DIFFERENTIAL/PLATELET - Abnormal; Notable for the following components:      Result Value   RBC 3.86 (*)    Hemoglobin 10.6 (*)    HCT 32.9 (*)    All other components within normal limits  COMPREHENSIVE METABOLIC PANEL  URINALYSIS, ROUTINE W REFLEX MICROSCOPIC  PREGNANCY, URINE  LIPASE, BLOOD    EKG None  Radiology No results found.  Procedures Procedures   Medications Ordered in ED Medications  acetaminophen (TYLENOL) tablet 650 mg (has no administration in time range)    ED Course  I have reviewed the triage vital signs and the nursing notes.  Pertinent labs & imaging results that were available during my care of the patient were reviewed by me and considered in my medical decision making (see chart  for details).    MDM Rules/Calculators/A&P                          30 year old female who is [redacted] weeks pregnant presents the emergency department right upper quadrant discomfort.  She is status post cholecystectomy.  She is also status post tubal ligation but does have a current pregnancy, ultrasound a couple weeks ago identified an IUP without any other concern for ectopic.  She was seen a couple days ago for vaginal discharge, she has a cervical swab that  is pending.  She has no further vaginal bleeding or discharge.  Blood work is reassuring, ultrasound here shows a stable biliary duct.  Spoke with on-call OB/GYN Dr. Despina Hidden, no concern for PID/perihepatic involvement given her current pregnant state.  He agreed with ruling out biliary duct pathology which we have.  Otherwise recommend Tylenol and outpatient follow-up.  The cervical swab that they performed in the office has not resulted yet, they will follow-up results.  UA shows bacteriuria, in the setting of pregnancy we will treat with antibiotics.  She will follow up with her OB/GYN, they are up-to-date on her visit today.  Patient will be discharged and treated as an outpatient.  Discharge plan and strict return to ED precautions discussed, patient verbalizes understanding and agreement.  Final Clinical Impression(s) / ED Diagnoses Final diagnoses:  None    Rx / DC Orders ED Discharge Orders    None       Rozelle Logan, DO 01/24/21 1404

## 2021-01-24 NOTE — ED Notes (Signed)
Pt reports she is unable to give urine sample at this time. 

## 2021-01-25 LAB — CERVICOVAGINAL ANCILLARY ONLY
Bacterial Vaginitis (gardnerella): POSITIVE — AB
Candida Glabrata: NEGATIVE
Candida Vaginitis: POSITIVE — AB
Chlamydia: NEGATIVE
Comment: NEGATIVE
Comment: NEGATIVE
Comment: NEGATIVE
Comment: NEGATIVE
Comment: NEGATIVE
Comment: NORMAL
Neisseria Gonorrhea: NEGATIVE
Trichomonas: NEGATIVE

## 2021-01-26 ENCOUNTER — Telehealth: Payer: Self-pay

## 2021-01-26 NOTE — Telephone Encounter (Signed)
Transition Care Management Unsuccessful Follow-up Telephone Call  Date of discharge and from where:  01/24/2021 from [facility]  Attempts:  1st Attempt  Reason for unsuccessful TCM follow-up call:  Left voice message

## 2021-01-26 NOTE — Telephone Encounter (Signed)
-----   Message from Hermina Staggers, MD sent at 01/26/2021  1:47 PM EST ----- Please send in Rx for BV and yeast.  Flagyl 500 mg po bid x 7 days Terazol 1 applicator qhs x 7 days  Pt is aware.  Thanks Casimiro Needle

## 2021-01-26 NOTE — Telephone Encounter (Signed)
Called in verbal order for prescriptions per Dr Alysia Penna.

## 2021-01-27 NOTE — Telephone Encounter (Signed)
Transition Care Management Unsuccessful Follow-up Telephone Call  Date of discharge and from where:  01/24/2021 from [facility]  Attempts:  2nd Attempt  Reason for unsuccessful TCM follow-up call:  Left voice message

## 2021-01-28 NOTE — Telephone Encounter (Signed)
Transition Care Management Unsuccessful Follow-up Telephone Call  Date of discharge and from where:  01/24/2021 - [name]  Attempts:  3rd Attempt  Reason for unsuccessful TCM follow-up call:  Left voice message

## 2021-02-17 ENCOUNTER — Other Ambulatory Visit: Payer: Self-pay

## 2021-02-17 ENCOUNTER — Ambulatory Visit (INDEPENDENT_AMBULATORY_CARE_PROVIDER_SITE_OTHER): Payer: 59 | Admitting: Advanced Practice Midwife

## 2021-02-17 ENCOUNTER — Ambulatory Visit: Payer: 59 | Admitting: *Deleted

## 2021-02-17 ENCOUNTER — Encounter: Payer: Self-pay | Admitting: Advanced Practice Midwife

## 2021-02-17 VITALS — Wt 328.0 lb

## 2021-02-17 DIAGNOSIS — Z8759 Personal history of other complications of pregnancy, childbirth and the puerperium: Secondary | ICD-10-CM

## 2021-02-17 DIAGNOSIS — Z3A16 16 weeks gestation of pregnancy: Secondary | ICD-10-CM | POA: Diagnosis not present

## 2021-02-17 DIAGNOSIS — Z3480 Encounter for supervision of other normal pregnancy, unspecified trimester: Secondary | ICD-10-CM

## 2021-02-17 DIAGNOSIS — Z6791 Unspecified blood type, Rh negative: Secondary | ICD-10-CM | POA: Insufficient documentation

## 2021-02-17 DIAGNOSIS — Z8659 Personal history of other mental and behavioral disorders: Secondary | ICD-10-CM

## 2021-02-17 DIAGNOSIS — O26899 Other specified pregnancy related conditions, unspecified trimester: Secondary | ICD-10-CM | POA: Diagnosis not present

## 2021-02-17 DIAGNOSIS — Z8632 Personal history of gestational diabetes: Secondary | ICD-10-CM

## 2021-02-17 DIAGNOSIS — Z3490 Encounter for supervision of normal pregnancy, unspecified, unspecified trimester: Secondary | ICD-10-CM | POA: Insufficient documentation

## 2021-02-17 DIAGNOSIS — Z363 Encounter for antenatal screening for malformations: Secondary | ICD-10-CM | POA: Diagnosis not present

## 2021-02-17 DIAGNOSIS — O0990 Supervision of high risk pregnancy, unspecified, unspecified trimester: Secondary | ICD-10-CM | POA: Insufficient documentation

## 2021-02-17 LAB — POCT URINALYSIS DIPSTICK OB
Blood, UA: NEGATIVE
Glucose, UA: NEGATIVE
Ketones, UA: NEGATIVE
Leukocytes, UA: NEGATIVE
Nitrite, UA: NEGATIVE
POC,PROTEIN,UA: NEGATIVE

## 2021-02-17 MED ORDER — SERTRALINE HCL 50 MG PO TABS: 50.0000 mg | ORAL_TABLET | Freq: Every day | ORAL | 3 refills | Status: DC

## 2021-02-17 NOTE — Progress Notes (Signed)
INITIAL OBSTETRICAL VISIT Patient name: Patricia [name] MRN [id number]  Date of birth: [birth year] Chief Complaint:   Initial Prenatal Visit  History of Present Illness:   Patricia [name] is a 30 y.o. [id number] African American female at [redacted]w[redacted]d by LMP c/w u/s at 9 weeks with an Estimated Date of Delivery: 08/03/21 being seen today for her initial obstetrical visit.   Her obstetrical history is significant for SAB X1, SVB x4, A1DM w/1st pg, 35 week PTD (2nd pg).  Took Makena last 2 (stopped mid pregnancy d/t rash on arm).   pregnancies, had full term deliveries.Declines this pregnancy.  Had BTL (modified [place]) after last delivery).  Today she reports anxiety.FOB cheats a lot, but reluctant to break up her family. She stopped her zoloft when she found out she was pregnant. Wants to restart.  Offered therapy referral and accepted. .  Depression screen [facility] 2/9 02/17/2021 11/05/2019 02/12/2019 01/15/2019  Decreased Interest 0 1 0 0  Down, Depressed, Hopeless 2 1 0 0  PHQ - 2 Score 2 2 0 0  Altered sleeping 0 1 1 -  Tired, decreased energy 1 1 1  -  Change in appetite 1 1 1  -  Feeling bad or failure about yourself  2 1 0 -  Trouble concentrating 0 0 0 -  Moving slowly or fidgety/restless 0 0 0 -  Suicidal thoughts 0 0 0 -  PHQ-9 Score 6 6 3  -  Difficult doing work/chores - Not difficult at all - -  Some recent data might be hidden    Patient's last menstrual period was 10/27/2020. Last pap 02/02/19. Results were: normal [name]:   Pertinent items are noted in HPI Denies cramping/contractions, leakage of fluid, vaginal bleeding, abnormal vaginal discharge w/ itching/odor/irritation, headaches, visual changes, shortness of breath, chest pain, abdominal pain, severe nausea/vomiting, or problems with urination or bowel movements unless otherwise stated above.  Pertinent History Reviewed:  Reviewed past medical,surgical, social, obstetrical and family history.  Reviewed problem list,  medications and allergies. OB History  Gravida Para Term Preterm AB Living  6 4 3 1 1 4   SAB IAB Ectopic Multiple Live Births  1     0 4    # Outcome Date GA Lbr Len/2nd Weight Sex Delivery Anes PTL Lv  6 Current           5 Term 09/15/19 [redacted]w[redacted]d 13:05 / 00:04 7 lb 1.4 oz (3.215 kg) F Vag-Spont EPI  LIV  4 SAB 02/02/18          3 Term 10/04/15 [redacted]w[redacted]d 03:47 / 00:08 6 lb 9.1 oz (2.98 kg) F Vag-Spont None N LIV  2 Preterm 04/20/13 [redacted]w[redacted]d 10:57 / 00:03 6 lb 1 oz (2.75 kg) M Vag-Spont EPI Y LIV     Complications: Preterm premature rupture of membranes (PPROM) delivered, current hospitalization  1 Term 07/31/11 [redacted]w[redacted]d 21:47 / 00:46 8 lb (3.63 kg) M Vag-Spont EPI N LIV     Complications: Gestational diabetes   Physical Assessment:   Vitals:   02/17/21 0938  Weight: (!) 328 lb (148.8 kg)  Body mass index is 51.37 kg/m.       Physical Examination:  General appearance - well appearing, and in no distress  Mental status - alert, oriented to person, place, and time  Psych:  She has a normal mood and affect  Skin - warm and dry, normal color, no suspicious lesions noted  Chest - effort normal  Heart - normal rate and  regular rhythm  Abdomen - soft, nontender  Extremities:  No swelling or varicosities noted   No results found for this or any previous visit (from the past 24 hour(s)).  Assessment & Plan:  1) Low-Risk Pregnancy [id number] at [redacted]w[redacted]d with an Estimated Date of Delivery: 08/03/21   2) Initial OB visit  3) Hx A1DM  A1c today  4)  Hx PTD w/2nd pg.  Offered Makena and Declines  5) Hx BTL--plans 6-8 week saplingectomy  6)  Anxiety:  Restart zoloft 50mg , referral to [facility] sent.    Meds: No orders of the defined types were placed in this encounter.   Initial labs obtained Continue prenatal vitamins Reviewed n/v relief measures and warning s/s to report Reviewed recommended weight gain based on pre-gravid BMI Encouraged well-balanced diet Genetic & carrier screening discussed:  requests Panorama and AFP, declines NT/IT and Horizon 14  Ultrasound discussed; fetal survey: requested CCNC completed> form faxed if has or is planning to apply for medicaid The nature of [facility] with multiple MDs and other Advanced Practice Providers was explained to patient; also emphasized that fellows, residents, and students are part of our team. bp cuff. Rx faxed. Check bp weekly, let [employer] know if >140/90.        [place] Cresenzo-Dishmon 10:02 AM

## 2021-02-17 NOTE — Patient Instructions (Signed)
Ricky Ala, I greatly value your feedback.  If you receive a survey following your visit with [place] today, we appreciate you taking the time to fill it out.  Thanks, Cathie Beams, DNP, CNM  [facility] HAS MOVED!!! It is now [facility] ([address] [place], [place] [address]) Entrance located off of [address] Free 24/7 valet parking   Nausea & Vomiting  Have saltine crackers or pretzels by your bed and eat a few bites before you raise your head out of bed in the morning  Eat small frequent meals throughout the day instead of large meals  Drink plenty of fluids throughout the day to stay hydrated, just don't drink a lot of fluids with your meals.  This can make your stomach fill up faster making you feel sick  Do not brush your teeth right after you eat  Products with real ginger are good for nausea, like ginger ale and ginger hard candy Make sure it says made with real ginger!  Sucking on sour candy like lemon heads is also good for nausea  If your prenatal vitamins make you nauseated, take them at night so you will sleep through the nausea  Sea Bands  If you feel like you need medicine for the nausea & vomiting please let us know  If you are unable to keep any fluids or food down please let us know   Constipation  Drink plenty of fluid, preferably water, throughout the day  Eat foods high in fiber such as fruits, vegetables, and grains  Exercise, such as walking, is a good way to keep your bowels regular  Drink warm fluids, especially warm prune juice, or decaf coffee  Eat a 1/2 cup of real oatmeal (not instant), 1/2 cup applesauce, and 1/2-1 cup warm prune juice every day  If needed, you may take Colace (docusate sodium) stool softener once or twice a day to help keep the stool soft.   If you still are having problems with constipation, you may take Miralax once daily as needed to help keep your bowels regular.    Home Blood Pressure Monitoring for Patients   Your provider has recommended that you check your blood pressure (BP) at least once a week at home. If you do not have a blood pressure cuff at home, one will be provided for you. Contact your provider if you have not received your monitor within 1 week.   Helpful Tips for Accurate Home Blood Pressure Checks  . Don't smoke, exercise, or drink caffeine 30 minutes before checking your BP . Use the restroom before checking your BP (a full bladder can raise your pressure) . Relax in a comfortable upright chair . Feet on the ground . Left arm resting comfortably on a flat surface at the level of your heart . Legs uncrossed . Back supported . Sit quietly and don't talk . Place the cuff on your bare arm . Adjust snuggly, so that only two fingertips can fit between your skin and the top of the cuff . Check 2 readings separated by at least one minute . Keep a log of your BP readings . For a visual, please reference this diagram: http://ccnc.care/bpdiagram  Provider Name: [facility]     Phone: [phone]  Zone 1: ALL CLEAR  Continue to monitor your symptoms:  . BP reading is less than 140 (top number) or less than 90 (bottom number)  . No right upper stomach  pain . No headaches or seeing spots . No feeling nauseated or throwing up . No swelling in face and hands  Zone 2: CAUTION Call your doctor's office for any of the following:  . BP reading is greater than 140 (top number) or greater than 90 (bottom number)  . Stomach pain under your ribs in the middle or right side . Headaches or seeing spots . Feeling nauseated or throwing up . Swelling in face and hands  Zone 3: EMERGENCY  Seek immediate medical care if you have any of the following:  . BP reading is greater than160 (top number) or greater than 110 (bottom number) . Severe headaches not improving with Tylenol . Serious difficulty catching your breath . Any worsening  symptoms from Zone 2    First Trimester of Pregnancy The first trimester of pregnancy is from week 1 until the end of week 12 (months 1 through 3). A week after a sperm fertilizes an egg, the egg will implant on the wall of the uterus. This embryo will begin to develop into a baby. Genes from you and your partner are forming the baby. The female genes determine whether the baby is a boy or a girl. At 6-8 weeks, the eyes and face are formed, and the heartbeat can be seen on ultrasound. At the end of 12 weeks, all the baby's organs are formed.  Now that you are pregnant, you will want to do everything you can to have a healthy baby. Two of the most important things are to get good prenatal care and to follow your health care provider's instructions. Prenatal care is all the medical care you receive before the baby's birth. This care will help prevent, find, and treat any problems during the pregnancy and childbirth. BODY CHANGES Your body goes through many changes during pregnancy. The changes vary from woman to woman.   You may gain or lose a couple of pounds at first.  You may feel sick to your stomach (nauseous) and throw up (vomit). If the vomiting is uncontrollable, call your health care provider.  You may tire easily.  You may develop headaches that can be relieved by medicines approved by your health care provider.  You may urinate more often. Painful urination may mean you have a bladder infection.  You may develop heartburn as a result of your pregnancy.  You may develop constipation because certain hormones are causing the muscles that push waste through your intestines to slow down.  You may develop hemorrhoids or swollen, bulging veins (varicose veins).  Your breasts may begin to grow larger and become tender. Your nipples may stick out more, and the tissue that surrounds them (areola) may become darker.  Your gums may bleed and may be sensitive to brushing and flossing.  Dark  spots or blotches (chloasma, mask of pregnancy) may develop on your face. This will likely fade after the baby is born.  Your menstrual periods will stop.  You may have a loss of appetite.  You may develop cravings for certain kinds of food.  You may have changes in your emotions from day to day, such as being excited to be pregnant or being concerned that something may go wrong with the pregnancy and baby.  You may have more vivid and strange dreams.  You may have changes in your hair. These can include thickening of your hair, rapid growth, and changes in texture. Some women also have hair loss during or after pregnancy, or hair that  feels dry or thin. Your hair will most likely return to normal after your baby is born. WHAT TO EXPECT AT YOUR PRENATAL VISITS During a routine prenatal visit:  You will be weighed to make sure you and the baby are growing normally.  Your blood pressure will be taken.  Your abdomen will be measured to track your baby's growth.  The fetal heartbeat will be listened to starting around week 10 or 12 of your pregnancy.  Test results from any previous visits will be discussed. Your health care provider may ask you:  How you are feeling.  If you are feeling the baby move.  If you have had any abnormal symptoms, such as leaking fluid, bleeding, severe headaches, or abdominal cramping.  If you have any questions. Other tests that may be performed during your first trimester include:  Blood tests to find your blood type and to check for the presence of any previous infections. They will also be used to check for low iron levels (anemia) and Rh antibodies. Later in the pregnancy, blood tests for diabetes will be done along with other tests if problems develop.  Urine tests to check for infections, diabetes, or protein in the urine.  An ultrasound to confirm the proper growth and development of the baby.  An amniocentesis to check for possible genetic  problems.  Fetal screens for spina bifida and Down syndrome.  You may need other tests to make sure you and the baby are doing well. HOME CARE INSTRUCTIONS  Medicines  Follow your health care provider's instructions regarding medicine use. Specific medicines may be either safe or unsafe to take during pregnancy.  Take your prenatal vitamins as directed.  If you develop constipation, try taking a stool softener if your health care provider approves. Diet  Eat regular, well-balanced meals. Choose a variety of foods, such as meat or vegetable-based protein, fish, milk and low-fat dairy products, vegetables, fruits, and whole grain breads and cereals. Your health care provider will help you determine the amount of weight gain that is right for you.  Avoid raw meat and uncooked cheese. These carry germs that can cause birth defects in the baby.  Eating four or five small meals rather than three large meals a day may help relieve nausea and vomiting. If you start to feel nauseous, eating a few soda crackers can be helpful. Drinking liquids between meals instead of during meals also seems to help nausea and vomiting.  If you develop constipation, eat more high-fiber foods, such as fresh vegetables or fruit and whole grains. Drink enough fluids to keep your urine clear or pale yellow. Activity and Exercise  Exercise only as directed by your health care provider. Exercising will help you:  Control your weight.  Stay in shape.  Be prepared for labor and delivery.  Experiencing pain or cramping in the lower abdomen or low back is a good sign that you should stop exercising. Check with your health care provider before continuing normal exercises.  Try to avoid standing for long periods of time. Move your legs often if you must stand in one place for a long time.  Avoid heavy lifting.  Wear low-heeled shoes, and practice good posture.  You may continue to have sex unless your health care  provider directs you otherwise. Relief of Pain or Discomfort  Wear a good support bra for breast tenderness.    Take warm sitz baths to soothe any pain or discomfort caused by hemorrhoids. Use hemorrhoid cream  if your health care provider approves.    Rest with your legs elevated if you have leg cramps or low back pain.  If you develop varicose veins in your legs, wear support hose. Elevate your feet for 15 minutes, 3-4 times a day. Limit salt in your diet. Prenatal Care  Schedule your prenatal visits by the twelfth week of pregnancy. They are usually scheduled monthly at first, then more often in the last 2 months before delivery.  Write down your questions. Take them to your prenatal visits.  Keep all your prenatal visits as directed by your health care provider. Safety  Wear your seat belt at all times when driving.  Make a list of emergency phone numbers, including numbers for family, friends, the hospital, and police and fire departments. General Tips  Ask your health care provider for a referral to a local prenatal education class. Begin classes no later than at the beginning of month 6 of your pregnancy.  Ask for help if you have counseling or nutritional needs during pregnancy. Your health care provider can offer advice or refer you to specialists for help with various needs.  Do not use hot tubs, steam rooms, or saunas.  Do not douche or use tampons or scented sanitary pads.  Do not cross your legs for long periods of time.  Avoid cat litter boxes and soil used by cats. These carry germs that can cause birth defects in the baby and possibly loss of the fetus by miscarriage or stillbirth.  Avoid all smoking, herbs, alcohol, and medicines not prescribed by your health care provider. Chemicals in these affect the formation and growth of the baby.  Schedule a dentist appointment. At home, brush your teeth with a soft toothbrush and be gentle when you floss. SEEK MEDICAL  CARE IF:   You have dizziness.  You have mild pelvic cramps, pelvic pressure, or nagging pain in the abdominal area.  You have persistent nausea, vomiting, or diarrhea.  You have a bad smelling vaginal discharge.  You have pain with urination.  You notice increased swelling in your face, hands, legs, or ankles. SEEK IMMEDIATE MEDICAL CARE IF:   You have a fever.  You are leaking fluid from your vagina.  You have spotting or bleeding from your vagina.  You have severe abdominal cramping or pain.  You have rapid weight gain or loss.  You vomit blood or material that looks like coffee grounds.  You are exposed to [place] measles and have never had them.  You are exposed to fifth disease or chickenpox.  You develop a severe headache.  You have shortness of breath.  You have any kind of trauma, such as from a fall or a car accident. Document Released: 11/07/2001 Document Revised: 03/30/2014 Document Reviewed: 09/23/2013 [facility] Patient Information 2015 [place], [place]. This information is not intended to replace advice given to you by your health care provider. Make sure you discuss any questions you have with your health care provider.  Coronavirus (COVID-19) Are you at risk?  Are you at risk for the Coronavirus (COVID-19)?  To be considered HIGH RISK for Coronavirus (COVID-19), you have to meet the following criteria:  . Traveled to [place], [place], [place], [place] or [place];  and have fever, cough, and shortness of breath within the last 2 weeks of travel OR . Been in close contact with a person diagnosed with COVID-19 within the last 2 weeks and have fever, cough, and shortness of breath . IF YOU DO  NOT MEET THESE CRITERIA, YOU ARE CONSIDERED LOW RISK FOR COVID-19.  What to do if you are HIGH RISK for COVID-19?  Marland Kitchen If you are having a medical emergency, call 911. . Seek medical care right away. Before you go to a doctor's office, urgent care or emergency department,  call ahead and tell them about your recent travel, contact with someone diagnosed with COVID-19, and your symptoms. You should receive instructions from your physician's office regarding next steps of care.  . When you arrive at healthcare provider, tell the healthcare staff immediately you have returned from visiting [place], [place], [place], [place] or [place]; in the last two weeks or you have been in close contact with a person diagnosed with COVID-19 in the last 2 weeks.   . Tell the health care staff about your symptoms: fever, cough and shortness of breath. . After you have been seen by a medical provider, you will be either: o Tested for (COVID-19) and discharged home on quarantine except to seek medical care if symptoms worsen, and asked to  - Stay home and avoid contact with others until you get your results (4-5 days)  - Avoid travel on public transportation if possible (such as bus, train, or airplane) or o Sent to the Emergency Department by EMS for evaluation, COVID-19 testing, and possible admission depending on your condition and test results.  What to do if you are LOW RISK for COVID-19?  Reduce your risk of any infection by using the same precautions used for avoiding the common cold or flu:  Marland Kitchen Wash your hands often with soap and warm water for at least 20 seconds.  If soap and water are not readily available, use an alcohol-based hand sanitizer with at least 60% alcohol.  . If coughing or sneezing, cover your mouth and nose by coughing or sneezing into the elbow areas of your shirt or coat, into a tissue or into your sleeve (not your hands). . Avoid shaking hands with others and consider head nods or verbal greetings only. . Avoid touching your eyes, nose, or mouth with unwashed hands.  . Avoid close contact with people who are sick. . Avoid places or events with large numbers of people in one location, like concerts or sporting events. . Carefully consider travel plans you have or  are making. . If you are planning any travel outside or inside the [place], visit the CDC's Travelers' Health webpage for the latest health notices. . If you have some symptoms but not all symptoms, continue to monitor at home and seek medical attention if your symptoms worsen. . If you are having a medical emergency, call 911.   [place] / e-Visit: [email or web address]         [facility]: [facility]: [phone]                   [facility]: [phone]     Safe Medications in Pregnancy   Acne: Benzoyl Peroxide Salicylic Acid  Backache/Headache: Tylenol: 2 regular strength every 4 hours OR              2 Extra strength every 6 hours  Colds/Coughs/Allergies: Benadryl (alcohol free) 25 mg every 6 hours as needed Breath right strips Claritin Cepacol throat lozenges Chloraseptic throat spray Cold-Eeze- up to three times per day Cough drops, alcohol free Flonase (by prescription only) Guaifenesin Mucinex Robitussin DM (plain only, alcohol  free) Saline nasal spray/drops Sudafed (pseudoephedrine) & Actifed ** use only after [redacted] weeks gestation and if you do not have high blood pressure Tylenol Vicks Vaporub Zinc lozenges Zyrtec   Constipation: Colace Ducolax suppositories Fleet enema Glycerin suppositories Metamucil Milk of magnesia Miralax Senokot Smooth move tea  Diarrhea: Kaopectate Imodium A-D  *NO pepto Bismol  Hemorrhoids: Anusol Anusol HC Preparation H Tucks  Indigestion: Tums Maalox Mylanta Zantac  Pepcid  Insomnia: Benadryl (alcohol free) 25mg  every 6 hours as needed Tylenol PM Unisom, no Gelcaps  Leg Cramps: Tums MagGel  Nausea/Vomiting:  Bonine Dramamine Emetrol Ginger extract Sea bands Meclizine  Nausea medication to take during pregnancy:  Unisom (doxylamine succinate  25 mg tablets) Take one tablet daily at bedtime. If symptoms are not adequately controlled, the dose can be increased to a maximum recommended dose of two tablets daily (1/2 tablet in the morning, 1/2 tablet mid-afternoon and one at bedtime). Vitamin B6 100mg  tablets. Take one tablet twice a day (up to 200 mg per day).  Skin Rashes: Aveeno products Benadryl cream or 25mg  every 6 hours as needed Calamine Lotion 1% cortisone cream  Yeast infection: Gyne-lotrimin 7 Monistat 7   **If taking multiple medications, please check labels to avoid duplicating the same active ingredients **take medication as directed on the label ** Do not exceed 4000 mg of tylenol in 24 hours **Do not take medications that contain aspirin or ibuprofen

## 2021-02-18 LAB — PMP SCREEN PROFILE (10S), URINE
Amphetamine Scrn, Ur: NEGATIVE ng/mL
BARBITURATE SCREEN URINE: NEGATIVE ng/mL
BENZODIAZEPINE SCREEN, URINE: NEGATIVE ng/mL
CANNABINOIDS UR QL SCN: NEGATIVE ng/mL
Cocaine (Metab) Scrn, Ur: NEGATIVE ng/mL
Creatinine(Crt), U: 116.3 mg/dL (ref 20.0–300.0)
Methadone Screen, Urine: NEGATIVE ng/mL
OXYCODONE+OXYMORPHONE UR QL SCN: NEGATIVE ng/mL
Opiate Scrn, Ur: NEGATIVE ng/mL
Ph of Urine: 7.3 (ref 4.5–8.9)
Phencyclidine Qn, Ur: NEGATIVE ng/mL
Propoxyphene Scrn, Ur: NEGATIVE ng/mL

## 2021-02-19 LAB — CBC/D/PLT+RPR+RH+ABO+RUB AB...
Antibody Screen: NEGATIVE
Basophils Absolute: 0 10*3/uL (ref 0.0–0.2)
Basos: 1 %
EOS (ABSOLUTE): 0.1 10*3/uL (ref 0.0–0.4)
Eos: 1 %
HCV Ab: <0.1 s/co ratio (ref 0.0–0.9)
HIV Screen 4th Generation wRfx: NONREACTIVE
Hematocrit: 34.1 % (ref 34.0–46.6)
Hemoglobin: 11.2 g/dL (ref 11.1–15.9)
Hepatitis B Surface Ag: NEGATIVE
Immature Grans (Abs): 0 10*3/uL (ref 0.0–0.1)
Immature Granulocytes: 0 %
Lymphocytes Absolute: 2.1 10*3/uL (ref 0.7–3.1)
Lymphs: 37 %
MCH: 27.3 pg (ref 26.6–33.0)
MCHC: 32.8 g/dL (ref 31.5–35.7)
MCV: 83 fL (ref 79–97)
Monocytes Absolute: 0.5 10*3/uL (ref 0.1–0.9)
Monocytes: 8 %
Neutrophils Absolute: 3 10*3/uL (ref 1.4–7.0)
Neutrophils: 53 %
Platelets: 206 10*3/uL (ref 150–450)
RBC: 4.11 x10E6/uL (ref 3.77–5.28)
RDW: 13.6 % (ref 11.7–15.4)
RPR Ser Ql: NONREACTIVE
Rh Factor: POSITIVE
Rubella Antibodies, IGG: 11 index (ref >0.99)
WBC: 5.7 10*3/uL (ref 3.4–10.8)

## 2021-02-19 LAB — AFP, SERUM, OPEN SPINA BIFIDA
AFP MoM: 0.86
AFP Value: 20.8 ng/mL
Gest. Age on Collection Date: 16.1 weeks
Maternal Age At EDD: 28.9 yr
OSBR Risk 1 IN: 10000
Test Results:: NEGATIVE
Weight: 328 [lb_av]

## 2021-02-19 LAB — URINE CULTURE

## 2021-02-19 LAB — HCV INTERPRETATION

## 2021-02-19 LAB — GC/CHLAMYDIA PROBE AMP
Chlamydia trachomatis, NAA: NEGATIVE
Neisseria Gonorrhoeae by PCR: NEGATIVE

## 2021-02-19 LAB — HEMOGLOBIN A1C
Est. average glucose Bld gHb Est-mCnc: 117 mg/dL
Hgb A1c MFr Bld: 5.7 % — ABNORMAL HIGH (ref 4.8–5.6)

## 2021-02-23 NOTE — BH Specialist Note (Signed)
Pt did not arrive to video visit and did not answer the phone ; Left HIPPA-compliant message to call back Benyamin Jeff from [facility] at [phone] (main office) or [phone] (Sandor Arboleda's office).  ; left MyChart message for patient.      

## 2021-02-25 ENCOUNTER — Encounter: Payer: Self-pay | Admitting: *Deleted

## 2021-02-28 ENCOUNTER — Telehealth: Payer: Self-pay | Admitting: Women's Health

## 2021-02-28 NOTE — Telephone Encounter (Signed)
[facility] called to report an elevated BP  BP 148/87 with no other symptoms - reported on 02/26/21 11:20pm

## 2021-02-28 NOTE — Telephone Encounter (Signed)
Left message @ 12:30 pm. JSY

## 2021-03-01 ENCOUNTER — Other Ambulatory Visit: Payer: Medicaid Other

## 2021-03-01 ENCOUNTER — Encounter: Payer: Self-pay | Admitting: *Deleted

## 2021-03-01 NOTE — Telephone Encounter (Signed)
Left message @ 10:55 am. Will send a MyChart message. JSY

## 2021-03-03 ENCOUNTER — Other Ambulatory Visit: Payer: Medicaid Other

## 2021-03-03 NOTE — Telephone Encounter (Signed)
Left message @ 2:35 pm. JSY 

## 2021-03-04 ENCOUNTER — Ambulatory Visit: Payer: Medicaid Other | Admitting: Clinical

## 2021-03-04 DIAGNOSIS — Z91199 Patient's noncompliance with other medical treatment and regimen due to unspecified reason: Secondary | ICD-10-CM

## 2021-03-04 DIAGNOSIS — Z5329 Procedure and treatment not carried out because of patient's decision for other reasons: Secondary | ICD-10-CM

## 2021-03-04 NOTE — Telephone Encounter (Signed)
I spoke with pt letting her know [facility] called with an elevated BP reading 4 days ago. I have been trying to reach pt since then. Pt hasn't checked her BP since then. Pt states she can check her BP tonight and will send me a MyChart message so I can view on Monday. Pt hasn't had any headache or blurred vision. JSY

## 2021-03-08 ENCOUNTER — Other Ambulatory Visit: Payer: Medicaid Other

## 2021-03-08 ENCOUNTER — Other Ambulatory Visit: Payer: Self-pay

## 2021-03-08 DIAGNOSIS — Z3480 Encounter for supervision of other normal pregnancy, unspecified trimester: Secondary | ICD-10-CM | POA: Diagnosis not present

## 2021-03-08 DIAGNOSIS — Z8632 Personal history of gestational diabetes: Secondary | ICD-10-CM | POA: Diagnosis not present

## 2021-03-08 DIAGNOSIS — Z131 Encounter for screening for diabetes mellitus: Secondary | ICD-10-CM | POA: Diagnosis not present

## 2021-03-08 DIAGNOSIS — R7309 Other abnormal glucose: Secondary | ICD-10-CM | POA: Diagnosis not present

## 2021-03-09 LAB — GLUCOSE TOLERANCE, 2 HOURS W/ 1HR
Glucose, 1 hour: 134 mg/dL (ref 65–179)
Glucose, 2 hour: 118 mg/dL (ref 65–152)
Glucose, Fasting: 92 mg/dL — ABNORMAL HIGH (ref 65–91)

## 2021-03-10 ENCOUNTER — Emergency Department (HOSPITAL_COMMUNITY)
Admission: EM | Admit: 2021-03-10 | Discharge: 2021-03-11 | Disposition: A | Discharge status: Home / Self Care | Payer: Medicaid Other | Attending: Emergency Medicine | Admitting: Emergency Medicine

## 2021-03-10 ENCOUNTER — Other Ambulatory Visit: Payer: Self-pay | Admitting: *Deleted

## 2021-03-10 ENCOUNTER — Encounter: Payer: Self-pay | Admitting: *Deleted

## 2021-03-10 ENCOUNTER — Encounter (HOSPITAL_COMMUNITY): Payer: Self-pay | Admitting: Emergency Medicine

## 2021-03-10 ENCOUNTER — Other Ambulatory Visit: Payer: Self-pay

## 2021-03-10 ENCOUNTER — Encounter: Payer: Self-pay | Admitting: Advanced Practice Midwife

## 2021-03-10 DIAGNOSIS — O24410 Gestational diabetes mellitus in pregnancy, diet controlled: Secondary | ICD-10-CM | POA: Diagnosis not present

## 2021-03-10 DIAGNOSIS — Z9049 Acquired absence of other specified parts of digestive tract: Secondary | ICD-10-CM | POA: Diagnosis not present

## 2021-03-10 DIAGNOSIS — O09212 Supervision of pregnancy with history of pre-term labor, second trimester: Secondary | ICD-10-CM | POA: Insufficient documentation

## 2021-03-10 DIAGNOSIS — O360120 Maternal care for anti-D [Rh] antibodies, second trimester, not applicable or unspecified: Secondary | ICD-10-CM | POA: Diagnosis not present

## 2021-03-10 DIAGNOSIS — F32A Depression, unspecified: Secondary | ICD-10-CM | POA: Diagnosis not present

## 2021-03-10 DIAGNOSIS — Z3A19 19 weeks gestation of pregnancy: Secondary | ICD-10-CM | POA: Insufficient documentation

## 2021-03-10 DIAGNOSIS — O99342 Other mental disorders complicating pregnancy, second trimester: Secondary | ICD-10-CM | POA: Insufficient documentation

## 2021-03-10 DIAGNOSIS — O24419 Gestational diabetes mellitus in pregnancy, unspecified control: Secondary | ICD-10-CM | POA: Insufficient documentation

## 2021-03-10 DIAGNOSIS — O26892 Other specified pregnancy related conditions, second trimester: Secondary | ICD-10-CM | POA: Insufficient documentation

## 2021-03-10 DIAGNOSIS — O4690 Antepartum hemorrhage, unspecified, unspecified trimester: Secondary | ICD-10-CM

## 2021-03-10 DIAGNOSIS — O26852 Spotting complicating pregnancy, second trimester: Secondary | ICD-10-CM | POA: Diagnosis not present

## 2021-03-10 DIAGNOSIS — O99212 Obesity complicating pregnancy, second trimester: Secondary | ICD-10-CM | POA: Diagnosis not present

## 2021-03-10 DIAGNOSIS — O209 Hemorrhage in early pregnancy, unspecified: Secondary | ICD-10-CM | POA: Diagnosis not present

## 2021-03-10 DIAGNOSIS — O0992 Supervision of high risk pregnancy, unspecified, second trimester: Secondary | ICD-10-CM

## 2021-03-10 LAB — CBC
HCT: 30.8 % — ABNORMAL LOW (ref 36.0–46.0)
Hemoglobin: 10 g/dL — ABNORMAL LOW (ref 12.0–15.0)
MCH: 27.6 pg (ref 26.0–34.0)
MCHC: 32.5 g/dL (ref 30.0–36.0)
MCV: 85.1 fL (ref 80.0–100.0)
Platelets: 191 10*3/uL (ref 150–400)
RBC: 3.62 MIL/uL — ABNORMAL LOW (ref 3.87–5.11)
RDW: 13.2 % (ref 11.5–15.5)
WBC: 7 10*3/uL (ref 4.0–10.5)
nRBC: 0 % (ref 0.0–0.2)

## 2021-03-10 LAB — BASIC METABOLIC PANEL
Anion gap: 8 (ref 5–15)
BUN: 8 mg/dL (ref 6–20)
CO2: 24 mmol/L (ref 22–32)
Calcium: 8.8 mg/dL — ABNORMAL LOW (ref 8.9–10.3)
Chloride: 104 mmol/L (ref 98–111)
Creatinine, Ser: 0.79 mg/dL (ref 0.44–1.00)
GFR, Estimated: >60 mL/min (ref >60)
Glucose, Bld: 108 mg/dL — ABNORMAL HIGH (ref 70–99)
Potassium: 3.5 mmol/L (ref 3.5–5.1)
Sodium: 136 mmol/L (ref 135–145)

## 2021-03-10 MED ORDER — ACCU-CHEK GUIDE ME W/DEVICE KIT: 1.0000 | PACK | Freq: Four times a day (QID) | 0 refills | Status: DC

## 2021-03-10 MED ORDER — ACCU-CHEK SOFTCLIX LANCETS MISC: 12 refills | Status: DC

## 2021-03-10 MED ORDER — ACCU-CHEK GUIDE VI STRP: ORAL_STRIP | 12 refills | Status: DC

## 2021-03-10 NOTE — ED Triage Notes (Addendum)
Pt reports she is [redacted]w[redacted]d pregnant and began having vaginal bleeding tonight; pt reports some abd pain and has not felt fetal movement since last night

## 2021-03-10 NOTE — ED Provider Notes (Signed)
[facility] EMERGENCY DEPARTMENT Provider Note   CSN: [id number] Arrival date & time: 03/10/21  2118     History Chief Complaint  Patient presents with  . Vaginal Bleeding    Patricia [name] is a 30 y.o. female.  Patient presents to the emergency department with concerns over vaginal bleeding in early pregnancy.  Patient reports that she has [redacted] weeks pregnant.  She has not felt movement for the last 24 hours.  She reports that when she got home from work a couple of hours ago she went to the bathroom and noticed that there was blood in her panties and she was experiencing vaginal bleeding.  She called her OB/GYN office who directed her to come to [facility], and not to go to [facility] hospital.  Initially there was no pain but she has now experiencing pain and cramping with bleeding.        Past Medical History:  Diagnosis Date  . Anemia   . Anxiety   . Bacterial vaginosis   . Chlamydia   . Depression    PP depression after 3rd pregnancy - resolved with therarpy  . Migraine   . Miscarriage 2019  . Preterm labor   . Preterm labor without delivery in second trimester June 2012   Positive FFN in June  . UTI (lower urinary tract infection)     Patient Active Problem List   Diagnosis Date Noted  . Gestational diabetes 03/10/2021  . History of postpartum depression 02/17/2021  . Supervision of high-risk pregnancy 02/17/2021  . Rh negative state in antepartum period 02/17/2021  . History of laparoscopic cholecystectomy 01/21/2021  . S/P tubal ligation 12/02/2020  . History of anxiety 10/22/2020  . Morbid obesity with BMI of 50.0-59.9, adult ([place]) 10/22/2020  . History of preterm delivery 11/08/2015  . History of gestational diabetes 11/08/2015  . 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 Obesity 04/01/2013    Past Surgical History:  Procedure Laterality Date  . CHOLECYSTECTOMY N/A 10/22/2020   Procedure: LAPAROSCOPIC CHOLECYSTECTOMY;  Surgeon: Virl Cagey, MD;   Location: [facility] ORS;  Service: General;  Laterality: N/A;  . TUBAL LIGATION N/A 09/15/2019   Procedure: POST PARTUM TUBAL LIGATION;  Surgeon: Florian Buff, MD;  Location: [facility] LD ORS;  Service: Gynecology;  Laterality: N/A;     OB History    Gravida  6   Para  4   Term  3   Preterm  1   AB  1   Living  4     SAB  1   IAB      Ectopic      Multiple  0   Live Births  4           Family History  Problem Relation Age of Onset  . Diabetes Other        great granmother  . Diabetes Paternal Grandfather   . Hypertension Paternal Grandfather   . Cancer Paternal Grandfather        prostate  . Miscarriages / Stillbirths Maternal Grandmother     Social History   Tobacco Use  . Smoking status: Never Smoker  . Smokeless tobacco: Never Used  Vaping Use  . Vaping Use: Never used  Substance Use Topics  . Alcohol use: Not Currently  . Drug use: No    Home Medications Prior to Admission medications   Medication Sig Start Date End Date Taking? Authorizing Provider  Accu-Chek Softclix Lancets lancets Use as instructed to check blood  sugar 4 times daily 03/10/21   Cresenzo-Dishmon, Joaquim Lai, CNM  acetaminophen (TYLENOL) 325 MG tablet Take 2 tablets (650 mg total) by mouth every 6 (six) hours as needed for fever. Patient not taking: Reported on 02/17/2021 10/30/20   Roxan Hockey, MD  Blood Glucose Monitoring Suppl (ACCU-CHEK GUIDE ME) w/Device KIT 1 each by Does not apply route 4 (four) times daily. 03/10/21   Cresenzo-Dishmon, Joaquim Lai, CNM  glucose blood (ACCU-CHEK GUIDE) test strip Use as instructed to check blood sugar 4 times daily 03/10/21   Christin Fudge, CNM  prenatal vitamin w/FE, FA (PRENATAL 1 + 1) 27-1 MG TABS tablet Take 1 tablet by mouth daily at 12 noon. 12/02/20   Estill Dooms, NP  sertraline (ZOLOFT) 50 MG tablet Take 1 tablet (50 mg total) by mouth daily. 02/17/21   Cresenzo-Dishmon, Joaquim Lai, CNM    Allergies    Patient has no known  allergies.  [name]   [name]  Genitourinary: Positive for pelvic pain and vaginal discharge.  All other systems reviewed and are negative.   Physical Exam Updated Vital Signs BP 117/75 (BP Location: Right Arm)   Pulse 91   Temp 98 F (36.7 C) (Oral)   Resp 17   Ht _0  (1.702 m)   Wt (!) 152.4 kg   LMP 10/27/2020 Comment: tubal  SpO2 100%   BMI 52.63 kg/m   Physical Exam Vitals and nursing note reviewed.  Constitutional:      General: She is not in acute distress.    Appearance: Normal appearance. She is well-developed.  HENT:     Head: Normocephalic and atraumatic.     Right Ear: Hearing normal.     Left Ear: Hearing normal.     Nose: Nose normal.  Eyes:     Conjunctiva/sclera: Conjunctivae normal.     Pupils: Pupils are equal, round, and reactive to light.  Cardiovascular:     Rate and Rhythm: Regular rhythm.     Heart sounds: S1 normal and S2 normal. No murmur heard. No friction rub. No gallop.   Pulmonary:     Effort: Pulmonary effort is normal. No respiratory distress.     Breath sounds: Normal breath sounds.  Chest:     Chest wall: No tenderness.  Abdominal:     General: Bowel sounds are normal.     Palpations: Abdomen is soft.     Tenderness: There is no abdominal tenderness. There is no guarding or rebound. Negative signs include Murphy's sign and McBurney's sign.     Hernia: No hernia is present.  Musculoskeletal:        General: Normal range of motion.     Cervical back: Normal range of motion and neck supple.  Skin:    General: Skin is warm and dry.     Findings: No rash.  Neurological:     Mental Status: She is alert and oriented to person, place, and time.     GCS: GCS eye subscore is 4. GCS verbal subscore is 5. GCS motor subscore is 6.     Cranial Nerves: No cranial nerve deficit.     Sensory: No sensory deficit.     Coordination: Coordination normal.  Psychiatric:        Speech: Speech normal.        Behavior:  Behavior normal.        Thought Content: Thought content normal.     ED Results / Procedures / Treatments   Labs (all labs  ordered are listed, but only abnormal results are displayed) Labs Reviewed  CBC  BASIC METABOLIC PANEL  HCG, QUANTITATIVE, PREGNANCY    EKG None  Radiology No results found.  Procedures Procedures   Medications Ordered in ED Medications - No data to display  ED Course  I have reviewed the triage vital signs and the nursing notes.  Pertinent labs & imaging results that were available during my care of the patient were reviewed by me and considered in my medical decision making (see chart for details).    MDM Rules/Calculators/A&P                          Patient presents to the emergency department for evaluation of vaginal bleeding.  Patient reports that she had an episode earlier tonight where she had some blood in her panties and noticed some persistent bleeding.  Patient is [redacted] weeks pregnant.  She reports that she has not felt movement for the last 24 hours.  I was able to perform an informal ultrasound at the bedside.  I could not get adequate images to store on the server, however, there was significant movement of the baby noted.  This was reassuring, will have formal ultrasound performed later today when available.  Patient given return precautions.  Final Clinical Impression(s) / ED Diagnoses Final diagnoses:  None    Rx / DC Orders ED Discharge Orders    None       Crawford Tamura, Gwenyth Allegra, MD 03/11/21 0144

## 2021-03-11 ENCOUNTER — Inpatient Hospital Stay (EMERGENCY_DEPARTMENT_HOSPITAL)
Admission: AD | Admit: 2021-03-11 | Discharge: 2021-03-11 | Disposition: A | Discharge status: Home / Self Care | Payer: Medicaid Other | Source: Home / Self Care | Attending: Obstetrics & Gynecology | Admitting: Obstetrics & Gynecology

## 2021-03-11 ENCOUNTER — Encounter (HOSPITAL_COMMUNITY): Payer: Self-pay | Admitting: Obstetrics & Gynecology

## 2021-03-11 ENCOUNTER — Inpatient Hospital Stay (HOSPITAL_BASED_OUTPATIENT_CLINIC_OR_DEPARTMENT_OTHER): Payer: Medicaid Other

## 2021-03-11 DIAGNOSIS — O0992 Supervision of high risk pregnancy, unspecified, second trimester: Secondary | ICD-10-CM | POA: Insufficient documentation

## 2021-03-11 DIAGNOSIS — O360120 Maternal care for anti-D [Rh] antibodies, second trimester, not applicable or unspecified: Secondary | ICD-10-CM

## 2021-03-11 DIAGNOSIS — O4690 Antepartum hemorrhage, unspecified, unspecified trimester: Secondary | ICD-10-CM

## 2021-03-11 DIAGNOSIS — O24410 Gestational diabetes mellitus in pregnancy, diet controlled: Secondary | ICD-10-CM | POA: Insufficient documentation

## 2021-03-11 DIAGNOSIS — O26892 Other specified pregnancy related conditions, second trimester: Secondary | ICD-10-CM | POA: Insufficient documentation

## 2021-03-11 DIAGNOSIS — Z79899 Other long term (current) drug therapy: Secondary | ICD-10-CM | POA: Insufficient documentation

## 2021-03-11 DIAGNOSIS — Z3A19 19 weeks gestation of pregnancy: Secondary | ICD-10-CM | POA: Insufficient documentation

## 2021-03-11 DIAGNOSIS — Z6791 Unspecified blood type, Rh negative: Secondary | ICD-10-CM

## 2021-03-11 DIAGNOSIS — O209 Hemorrhage in early pregnancy, unspecified: Secondary | ICD-10-CM | POA: Insufficient documentation

## 2021-03-11 DIAGNOSIS — R109 Unspecified abdominal pain: Secondary | ICD-10-CM | POA: Insufficient documentation

## 2021-03-11 DIAGNOSIS — O4692 Antepartum hemorrhage, unspecified, second trimester: Secondary | ICD-10-CM

## 2021-03-11 LAB — URINALYSIS, ROUTINE W REFLEX MICROSCOPIC
Bilirubin Urine: NEGATIVE
Glucose, UA: NEGATIVE mg/dL
Ketones, ur: 20 mg/dL — AB
Leukocytes,Ua: NEGATIVE
Nitrite: NEGATIVE
Protein, ur: NEGATIVE mg/dL
Specific Gravity, Urine: 1.023 (ref 1.005–1.030)
pH: 6 (ref 5.0–8.0)

## 2021-03-11 LAB — WET PREP, GENITAL
Sperm: NONE SEEN
Trich, Wet Prep: NONE SEEN
Yeast Wet Prep HPF POC: NONE SEEN

## 2021-03-11 LAB — HCG, QUANTITATIVE, PREGNANCY: hCG, Beta Chain, Quant, S: 19689 m[IU]/mL — ABNORMAL HIGH (ref <5)

## 2021-03-11 MED ORDER — RHO D IMMUNE GLOBULIN 1500 UNIT/2ML IJ SOSY
300.0000 ug | PREFILLED_SYRINGE | Freq: Once | INTRAMUSCULAR | Status: AC
  Administered 2021-03-11: 300 ug via INTRAMUSCULAR
  Filled 2021-03-11: qty 2

## 2021-03-11 NOTE — MAU Provider Note (Addendum)
History     CSN: [id number]  Arrival date and time: 03/11/21 1140   Event Date/Time   First Provider Initiated Contact with Patient 03/11/21 1628      Chief Complaint  Patient presents with  . Vaginal Bleeding  . Abdominal Pain   [name] is a 30 y.o. [id number] at 30w2dwho presents to MAU for vaginal bleeding which began yesterday. Patient reports yesterday when using the bathroom at the end of her work day, she saw a small amount of blood in her underwear and a small amount of bleeding on the toilet tissue. Patient reports she has continued to see blood only when wiping until coming to MAU, but reports she went to the bathroom a second time while waiting in MAU and saw almost no bleeding on the tissue. Patient called the after hours nurse line yesterday [facility] Patient reports she [facility] there, but felt like they could not do a lot for her, so she came here today. Patient denies abdominal pain.  Passing blood clots? no Blood soaking clothes? no Lightheaded/dizzy? no Significant pelvic pain or cramping/ctx? no Passed any tissue? no  Current pregnancy problems? GDM Blood Type? A NEGATIVE Allergies? NKDA   OB History    Gravida  6   Para  4   Term  3   Preterm  1   AB  1   Living  4     SAB  1   IAB      Ectopic      Multiple  0   Live Births  4           Past Medical History:  Diagnosis Date  . Anemia   . Anxiety   . Bacterial vaginosis   . Chlamydia   . Depression    PP depression after 3rd pregnancy - resolved with therarpy  . Migraine   . Miscarriage 2019  . Preterm labor   . Preterm labor without delivery in second trimester June 2012   Positive FFN in June  . UTI (lower urinary tract infection)     Past Surgical History:  Procedure Laterality Date  . CHOLECYSTECTOMY N/A 10/22/2020   Procedure: LAPAROSCOPIC CHOLECYSTECTOMY;  Surgeon: BVirl Cagey MD;  Location: [facility];  Service: General;  Laterality: N/A;  . TUBAL LIGATION N/A 09/15/2019   Procedure: POST PARTUM TUBAL LIGATION;  Surgeon: EFlorian Buff MD;  Location: [facility] LD ORS;  Service: Gynecology;  Laterality: N/A;    Family History  Problem Relation Age of Onset  . Diabetes Other        great granmother  . Diabetes Paternal Grandfather   . Hypertension Paternal Grandfather   . Cancer Paternal Grandfather        prostate  . Miscarriages / Stillbirths Maternal Grandmother     Social History   Tobacco Use  . Smoking status: Never Smoker  . Smokeless tobacco: Never Used  Vaping Use  . Vaping Use: Never used  Substance Use Topics  . Alcohol use: Not Currently  . Drug use: No    Allergies: No Known Allergies  Medications Prior to Admission  Medication Sig Dispense Refill Last Dose  . prenatal vitamin w/FE, FA (PRENATAL 1 + 1) 27-1 MG TABS tablet Take 1 tablet by mouth daily at 12 noon. 30 tablet 0 03/10/2021 at Unknown time  . sertraline (ZOLOFT) 50 MG tablet Take 1 tablet (50  mg total) by mouth daily. 90 tablet 3 03/10/2021 at Unknown time  . Accu-Chek Softclix Lancets lancets Use as instructed to check blood sugar 4 times daily 100 each 12   . acetaminophen (TYLENOL) 325 MG tablet Take 2 tablets (650 mg total) by mouth every 6 (six) hours as needed for fever. (Patient not taking: Reported on 02/17/2021) 12 tablet 0   . Blood Glucose Monitoring Suppl (ACCU-CHEK GUIDE ME) w/Device KIT 1 each by Does not apply route 4 (four) times daily. 1 kit 0   . glucose blood (ACCU-CHEK GUIDE) test strip Use as instructed to check blood sugar 4 times daily 50 each 12     [name]  Constitutional: Negative for chills, diaphoresis, fatigue and fever.  Eyes: Negative for visual disturbance.  Respiratory: Negative for shortness of breath.   Cardiovascular: Negative for chest pain.  Gastrointestinal: Negative for abdominal pain, constipation, diarrhea, nausea and vomiting.   Genitourinary: Positive for vaginal bleeding. Negative for dysuria, flank pain, frequency, pelvic pain, urgency and vaginal discharge.  Neurological: Negative for dizziness, weakness, light-headedness and headaches.   Physical Exam   Blood pressure 113/68, pulse 88, temperature 98.1 F (36.7 C), temperature source Oral, resp. rate 18, height _0  (1.702 m), weight (!) 149.6 kg, last menstrual period 10/27/2020, SpO2 100 %, not currently breastfeeding.  Patient Vitals for the past 24 hrs:  BP Temp Temp src Pulse Resp SpO2 Height Weight  03/11/21 1223 113/68 98.1 F (36.7 C) Oral 88 18 100 % _1  (1.702 m) (!) 149.6 kg   Physical Exam Vitals and nursing note reviewed. Exam conducted with a chaperone present.  Constitutional:      General: She is not in acute distress.    Appearance: Normal appearance. She is not ill-appearing, toxic-appearing or diaphoretic.  HENT:     Head: Normocephalic and atraumatic.  Pulmonary:     Effort: Pulmonary effort is normal.  Genitourinary:    General: Normal vulva.     Labia:        Right: No rash, tenderness or lesion.        Left: No rash, tenderness or lesion.      Comments: Cervix long/closed/posterior, no blood on glove after exam, speculum exam deferred Skin:    General: Skin is warm and dry.  Neurological:     Mental Status: She is alert and oriented to person, place, and time.  Psychiatric:        Mood and Affect: Mood normal.        Behavior: Behavior normal.        Thought Content: Thought content normal.        Judgment: Judgment normal.    Results for orders placed or performed during the hospital encounter of 03/11/21 (from the past 24 hour(s))  Urinalysis, Routine w reflex microscopic Urine, Clean Catch     Status: Abnormal   Collection Time: 03/11/21  1:29 PM  Result Value Ref Range   Color, Urine YELLOW YELLOW   APPearance HAZY (A) CLEAR   Specific Gravity, Urine 1.023 1.005 - 1.030   pH 6.0 5.0 - 8.0   Glucose, UA  NEGATIVE NEGATIVE mg/dL   Hgb urine dipstick SMALL (A) NEGATIVE   Bilirubin Urine NEGATIVE NEGATIVE   Ketones, ur 20 (A) NEGATIVE mg/dL   Protein, ur NEGATIVE NEGATIVE mg/dL   Nitrite NEGATIVE NEGATIVE   Leukocytes,Ua NEGATIVE NEGATIVE   RBC / HPF 0-5 0 - 5 RBC/hpf   WBC, UA 0-5 0 - 5  WBC/hpf   Bacteria, UA RARE (A) NONE SEEN   Squamous Epithelial / LPF 0-5 0 - 5   Mucus PRESENT   Rh IG workup (includes ABO/Rh)     Status: None (Preliminary result)   Collection Time: 03/11/21  2:19 PM  Result Value Ref Range   Gestational Age(Wks) 19.2    ABO/RH(D) A NEG    Antibody Screen      NEG Performed at [facility] [address], [place], [place] [address]    Unit Number [id number]    Blood Component Type RHIG    Unit division 00    Status of Unit ALLOCATED    Transfusion Status OK TO TRANSFUSE   Wet prep, genital     Status: Abnormal   Collection Time: 03/11/21  4:45 PM   Specimen: Vaginal  Result Value Ref Range   Yeast Wet Prep HPF POC NONE SEEN NONE SEEN   Trich, Wet Prep NONE SEEN NONE SEEN   Clue Cells Wet Prep HPF POC PRESENT (A) NONE SEEN   WBC, Wet Prep HPF POC MANY (A) NONE SEEN   Sperm NONE SEEN    No results found.  MAU Course  Procedures  MDM -VB per pt report, no blood on gloves after cervical exam or on blind swabs, speculum exam deferred -CE: long/closed/posterior -UA: hazy/sm hgb/20ketones/rare bacteria, sending urine for culture -[place]: posterior placenta, AFI WNL, no abruption or previa identified, CL 5.1cm -A NEGATIVE, RhoGAM given -WetPrep: +ClueCells (isolated finding not requiring treatment) -GC/CT collected -pt discharged to home in stable condition  Orders Placed This Encounter  Procedures  . Culture, OB Urine    Standing Status:   Standing    Number of Occurrences:   1  . Wet prep, genital    Standing Status:   Standing    Number of Occurrences:   1  . [place] MFM OB LIMITED    Standing Status:   Standing    Number of Occurrences:   1     Order Specific Question:   Symptom/Reason for Exam    Answer:   Vaginal bleeding in pregnancy [[id number]]  . Urinalysis, Routine w reflex microscopic Urine, Clean Catch    Standing Status:   Standing    Number of Occurrences:   1  . Rh IG workup (includes ABO/Rh)    Standing Status:   Standing    Number of Occurrences:   1    Order Specific Question:   Weeks of Gestation    Answer:   26.2    Order Specific Question:   RhIG indication:    Answer:   Routine Prenatal or 1st/2nd Trimester Bleeding  . Discharge patient    Order Specific Question:   Discharge disposition    Answer:   01-Home or Self Care [1]    Order Specific Question:   Discharge patient date    Answer:   03/11/2021   Meds ordered this encounter  Medications  . rho (d) immune globulin (RHIG/RHOPHYLAC) injection 300 mcg   Assessment and Plan   1. Vaginal bleeding in pregnancy, second trimester   2. Diet controlled gestational diabetes mellitus (GDM) in second trimester   3. Supervision of high risk pregnancy in second trimester   4. Rh negative state in antepartum period   5. Vaginal bleeding in pregnancy    Allergies as of 03/11/2021   No Known Allergies     Medication List    TAKE these medications   Accu-Chek Guide Me  w/Device Kit 1 each by Does not apply route 4 (four) times daily.   Accu-Chek Guide test strip Generic drug: glucose blood Use as instructed to check blood sugar 4 times daily   Accu-Chek Softclix Lancets lancets Use as instructed to check blood sugar 4 times daily   acetaminophen 325 MG tablet Commonly known as: TYLENOL Take 2 tablets (650 mg total) by mouth every 6 (six) hours as needed for fever.   prenatal vitamin w/FE, FA 27-1 MG Tabs tablet Take 1 tablet by mouth daily at 12 noon.   sertraline 50 MG tablet Commonly known as: ZOLOFT Take 1 tablet (50 mg total) by mouth daily.      -will call with culture results, if positive -RhoGAM given d/t pt report of bleeding -return  MAU precautions given -pt discharged to home in stable condition  Elmyra Ricks E Artemio Dobie 03/11/2021, 5:15 PM

## 2021-03-11 NOTE — Discharge Instructions (Signed)
Come back for the ultrasound later today.  You will need to schedule follow-up with your OB/GYN for next week.  If you have further problems over the weekend, please go to [facility] hospital for repeat evaluation.

## 2021-03-11 NOTE — MAU Note (Signed)
Noted blood in her panties, yesterday after work.  Talked to after hours nurse, she did want her to be seen. Went to [facility] as instructed.  No OB there. Not feeling baby moving like usually does.  Still seeing bleeding when she uses the restroom. Is having abd pain. Like a period cramp.

## 2021-03-11 NOTE — Discharge Instructions (Signed)
Vaginal Bleeding During Pregnancy, Second Trimester A small amount of bleeding from the vagina, or spotting, is common during early pregnancy. Some bleeding may be related to the pregnancy, and some may not. In many cases, the bleeding is normal and is not a problem. However, bleeding can also be a sign of something serious. Normal things may cause bleeding during the second trimester. They include:  Rapid changes in blood vessels. This is caused by changes that are happening to the body during pregnancy.  Sex.  Pelvic exams. Abnormal things that may cause bleeding during the second trimester include:  Infection, inflammation, or growths on the cervix. The growths on the cervix are also called polyps.  A condition in which the placenta partially or completely covers the opening of the cervix (placenta previa).  The placenta separating from the uterus (placenta abruption).  Miscarriage.  Early labor, also called preterm labor.  The cervix opening and thinning before pregnancy is at term and before labor starts (cervical insufficiency).  A mass of tissue developing in the uterus due to an egg being fertilized incorrectly (molar pregnancy). Tell your health care provider about any vaginal bleeding right away. Follow these instructions at home: Monitoring your bleeding Monitor your bleeding.  Pay attention to any changes in your symptoms. Let your health care provider know about any concerns.  Try to understand when the bleeding occurs. Does the bleeding start on its own, or does it start after something is done, such as sex or a pelvic exam?  Use a diary to record the things you see about your bleeding, including: ? The kind of bleeding you are having. Does the bleeding start and stop irregularly, or is it a constant flow? ? The severity of your bleeding. Is the bleeding heavy or light? ? The number of pads you use each day, how often you change them, and how soaked they are.  Tell  your health care provider if you pass tissue. He or she may want to see it.   Activity  Follow instructions from your health care provider about limiting your activity. Ask what activities are safe for you.  Do not exercise or do activities that take a lot of effort unless your health care provider approves.  Do not have sex until your health care provider says that this is safe.  Do not lift anything that is heavier than 10 lb (4.5 kg), or the limit that you are told, until your health care provider says that it is safe.  If needed, make plans for someone to help with your regular activities. Medicines  Take over-the-counter and prescription medicines only as told by your health care provider.  Do not take aspirin because it can cause bleeding. General instructions  Do not use tampons or douche.  Keep all follow-up visits. This is important. Contact a health care provider if:  You have vaginal bleeding during any time of your pregnancy.  You have cramps or labor pains.  You have a fever that does not get better when you take medicines. Get help right away if:  You have severe cramps in your back or abdomen.  You have contractions.  You have chills.  Your bleeding increases or you pass large clots or a large amount of tissue from your vagina.  You feel light-headed or weak, or you faint.  You are leaking fluid or have a gush of fluid from your vagina. Summary  A small amount of bleeding, or spotting, from the vagina is  common during early pregnancy.  Many things can cause vaginal bleeding during pregnancy. Tell your health care provider about any bleeding right away.  Try to understand when bleeding occurs. Does bleeding occur on its own, or does it occur after something is done, such as sex or pelvic exams?  Follow instructions from your health care provider about limiting your activity. Ask what activities are safe for you.  Keep all follow-up visits. This is  important. This information is not intended to replace advice given to you by your health care provider. Make sure you discuss any questions you have with your health care provider. Document Revised: 08/05/2020 Document Reviewed: 08/05/2020 Elsevier Patient Education  2021 [employer]        Preterm Labor The normal length of a pregnancy is 39-41 weeks. Preterm labor is when labor starts before 37 completed weeks of pregnancy. Babies who are born prematurely and survive may not be fully developed and may be at an increased risk for long-term problems such as cerebral palsy, developmental delays, and vision and hearing problems. Babies who are born too early may have problems soon after birth. Problems may include regulating blood sugar, body temperature, heart rate, and breathing rate. These babies often have trouble with feeding. The risk of having problems is highest for babies who are born before 34 weeks of pregnancy. What are the causes? The exact cause of this condition is not known. What increases the risk? You are more likely to have preterm labor if you have certain risk factors that relate to your medical history, problems with present and past pregnancies, and lifestyle factors. Medical history  You have abnormalities of the uterus, including a short cervix.  You have STIs (sexually transmitted infections), or other infections of the urinary tract and the vagina.  You have chronic illnesses, such as blood clotting problems, diabetes, or high blood pressure.  You are overweight or underweight. Present and past pregnancies  You have had preterm labor before.  You are pregnant with twins or other multiples.  You have been diagnosed with a condition in which the placenta covers your cervix (placenta previa).  You waited less than 6 months between giving birth and becoming pregnant again.  Your unborn baby has some abnormalities.  You have vaginal bleeding during  pregnancy.  You became pregnant through in vitro fertilization (IVF). Lifestyle and environmental factors  You use tobacco products.  You drink alcohol.  You use street drugs.  You have stress and no social support.  You experience domestic violence.  You are exposed to certain chemicals or environmental pollutants. Other factors  You are younger than age 40 or older than age 59. What are the signs or symptoms? Symptoms of this condition include:  Cramps similar to those that can happen during a menstrual period. The cramps may happen with diarrhea.  Pain in the abdomen or lower back.  Regular contractions that may feel like tightening of the abdomen.  A feeling of increased pressure in the pelvis.  Increased watery or bloody mucus discharge from the vagina.  Water breaking (ruptured amniotic sac). How is this diagnosed? This condition is diagnosed based on:  Your medical history and a physical exam.  A pelvic exam.  An ultrasound.  Monitoring your uterus for contractions.  Other tests, including: ? A swab of the cervix to check for a chemical called fetal fibronectin. ? Urine tests. How is this treated? Treatment for this condition depends on the length of your pregnancy, your  condition, and the health of your baby. Treatment may include:  Taking medicines, such as: ? Hormone medicines. These may be given early in pregnancy to help support the pregnancy. ? Medicines to stop contractions. ? Medicines to help mature the baby's lungs. These may be prescribed if the risk of delivery is high. ? Medicines to prevent your baby from developing cerebral palsy.  Bed rest. If the labor happens before 34 weeks of pregnancy, you may need to stay in the hospital.  Delivery of the baby. Follow these instructions at home:  Do not use any products that contain nicotine or tobacco, such as cigarettes, e-cigarettes, and chewing tobacco. If you need help quitting, ask your  health care provider.  Do not drink alcohol.  Take over-the-counter and prescription medicines only as told by your health care provider.  Rest as told by your health care provider.  Return to your normal activities as told by your health care provider. Ask your health care provider what activities are safe for you.  Keep all follow-up visits as told by your health care provider. This is important.   How is this prevented? To increase your chance of having a full-term pregnancy:  Do not use street drugs or medicines that have not been prescribed to you during your pregnancy.  Talk with your health care provider before taking any herbal supplements, even if you have been taking them regularly.  Make sure you gain a healthy amount of weight during your pregnancy.  Watch for infection. If you think that you might have an infection, get it checked right away. Symptoms of infection may include: ? Fever. ? Abnormal vaginal discharge or discharge that smells bad. ? Pain or burning with urination. ? Needing to urinate urgently. ? Frequently urinating or passing small amounts of urine frequently. ? Blood in your urine. ? Urine that smells bad or unusual.  Tell your health care provider if you have had preterm labor before. Contact a health care provider if:  You think you are going into preterm labor.  You have signs or symptoms of preterm labor.  You have symptoms of infection. Get help right away if:  You are having regular, painful contractions every 5 minutes or less.  Your water breaks. Summary  Preterm labor is labor that starts before you reach 37 weeks of pregnancy.  Delivering your baby early increases your baby's risk of developing lifelong problems.  The exact cause of preterm labor is unknown. However, having an abnormal uterus, an STI (sexually transmitted infection), or vaginal bleeding during pregnancy increases your risk for preterm labor.  Keep all follow-up  visits as told by your health care provider. This is important.  Contact a health care provider if you have signs or symptoms of preterm labor. This information is not intended to replace advice given to you by your health care provider. Make sure you discuss any questions you have with your health care provider. Document Revised: 12/16/2019 Document Reviewed: 12/16/2019 Elsevier Patient Education  2021 [employer].

## 2021-03-12 DIAGNOSIS — Z3A19 19 weeks gestation of pregnancy: Secondary | ICD-10-CM

## 2021-03-12 DIAGNOSIS — O26892 Other specified pregnancy related conditions, second trimester: Secondary | ICD-10-CM

## 2021-03-12 DIAGNOSIS — R102 Pelvic and perineal pain: Secondary | ICD-10-CM | POA: Diagnosis not present

## 2021-03-12 DIAGNOSIS — O4692 Antepartum hemorrhage, unspecified, second trimester: Secondary | ICD-10-CM

## 2021-03-12 LAB — RH IG WORKUP (INCLUDES ABO/RH)
ABO/RH(D): A NEG
Antibody Screen: NEGATIVE
Gestational Age(Wks): 19.2
Unit division: 0

## 2021-03-12 LAB — CULTURE, OB URINE: Culture: <10000 — AB

## 2021-03-14 ENCOUNTER — Telehealth: Payer: Self-pay | Admitting: *Deleted

## 2021-03-14 LAB — GC/CHLAMYDIA PROBE AMP (~~LOC~~) NOT AT ARMC
Chlamydia: NEGATIVE
Comment: NEGATIVE
Comment: NORMAL
Neisseria Gonorrhea: NEGATIVE

## 2021-03-14 NOTE — Telephone Encounter (Signed)
Transition Care Management Unsuccessful Follow-up Telephone Call  Date of discharge and from where:  03/11/2021 - [name]/  Attempts:  1st Attempt  Reason for unsuccessful TCM follow-up call:  Left voice message

## 2021-03-15 NOTE — Telephone Encounter (Signed)
Transition Care Management Follow-up Telephone Call  Date of discharge and from where: 03/11/2021 - [name]  How have you been since you were released from the hospital? "Feeling much better"  Any questions or concerns? No  Items Reviewed:  Did the pt receive and understand the discharge instructions provided? Yes   Medications obtained and verified? Yes   Other? No   Any new allergies since your discharge? No   Dietary orders reviewed? No  Do you have support at home? Yes    Functional Questionnaire: (I = Independent and D = Dependent) ADLs: I  Bathing/Dressing- I  Meal Prep- I  Eating- I  Maintaining continence- I  Transferring/Ambulation- I  Managing Meds- I  Follow up appointments reviewed:   PCP Hospital f/u appt confirmed? No .  Specialist Hospital f/u appt confirmed? Yes  Scheduled to see OBGYN on 03/21/2021 @ 1550.  Are transportation arrangements needed? No   If their condition worsens, is the pt aware to call PCP or go to the Emergency Dept.? Yes  Was the patient provided with contact information for the PCP's office or ED? Yes  Was to pt encouraged to call back with questions or concerns? Yes

## 2021-03-15 NOTE — BH Specialist Note (Deleted)
Integrated Behavioral Health Initial In-Person Visit  MRN: [id number] Name: Patricia [name]  Number of Integrated Behavioral Health Clinician visits:: 1/6 Session Start time: 8:15***  Session End time: 9:15*** Total time: {IBH Total Time:21014050} minutes  Types of Service: {CHL AMB TYPE OF SERVICE:7346654983}  Interpretor:No. Interpretor Name and Language: n/a    Warm Hand Off Completed.       Subjective: [name] is a 30 y.o. female accompanied by {CHL AMB ACCOMPANIED HO:1224825003} Patient was referred by *** for ***. Patient reports the following symptoms/concerns: *** Duration of problem: ***; Severity of problem: {Mild/Moderate/Severe:20260}  Objective: Mood: {BHH MOOD:22306} and Affect: {BHH AFFECT:22307} Risk of harm to self or others: {CHL AMB BH Suicide Current Mental Status:21022748}  Life Context: Family and Social: *** School/Work: *** Self-Care: *** Life Changes: ***  Patient and/or Family's Strengths/Protective Factors: {CHL AMB BH PROTECTIVE FACTORS:[phone]}  Goals Addressed: Patient will: 1. Reduce symptoms of: {IBH Symptoms:21014056} 2. Increase knowledge and/or ability of: {IBH Patient Tools:21014057}  3. Demonstrate ability to: {IBH Goals:21014053}  Progress towards Goals: {CHL AMB BH PROGRESS TOWARDS GOALS:[phone]}  Interventions: Interventions utilized: {IBH Interventions:21014054}  Standardized Assessments completed: {IBH Screening Tools:21014051}  Patient and/or Family Response: ***  Patient Centered Plan: Patient is on the following Treatment Plan(s):  ***  Assessment: Patient currently experiencing ***.   Patient may benefit from ***.  Plan: 1. Follow up with behavioral health clinician on : *** 2. Behavioral recommendations: *** 3. Referral(s): {IBH Referrals:21014055} 4. "From scale of 1-10, how likely are you to follow plan?": ***  [name], LCSW  Depression screen [facility] 2/9 02/17/2021 11/05/2019  02/12/2019 01/15/2019  Decreased Interest 0 1 0 0  Down, Depressed, Hopeless 2 1 0 0  PHQ - 2 Score 2 2 0 0  Altered sleeping 0 1 1 -  Tired, decreased energy 1 1 1  -  Change in appetite 1 1 1  -  Feeling bad or failure about yourself  2 1 0 -  Trouble concentrating 0 0 0 -  Moving slowly or fidgety/restless 0 0 0 -  Suicidal thoughts 0 0 0 -  PHQ-9 Score 6 6 3  -  Difficult doing work/chores - Not difficult at all - -  Some recent data might be hidden   GAD 7 : Generalized Anxiety Score 02/17/2021  Nervous, Anxious, on Edge 1  Control/stop worrying 1  Worry too much - different things 1  Trouble relaxing 0  Restless 0  Easily annoyed or irritable 0  Afraid - awful might happen 0  Total GAD 7 Score 3

## 2021-03-16 ENCOUNTER — Institutional Professional Consult (permissible substitution): Payer: Medicaid Other

## 2021-03-21 ENCOUNTER — Encounter: Payer: 59 | Admitting: Women's Health

## 2021-03-21 ENCOUNTER — Other Ambulatory Visit: Payer: 59

## 2021-03-22 ENCOUNTER — Telehealth: Payer: Self-pay | Admitting: Registered"

## 2021-03-22 ENCOUNTER — Other Ambulatory Visit: Payer: Medicaid Other

## 2021-03-22 NOTE — Telephone Encounter (Signed)
Patient access specialist sent notification that patient arrived at [facility] for her appointment scheduled at [facility] care and she was on her way over here.  Patient did not come to appointment. LVM asking patient to call and schedule a later time to come in today, times available: 11:15, 2:15 or 3:15.

## 2021-03-30 ENCOUNTER — Ambulatory Visit: Payer: Medicaid Other

## 2021-04-12 ENCOUNTER — Encounter: Payer: Self-pay | Admitting: Women's Health

## 2021-04-12 ENCOUNTER — Other Ambulatory Visit: Payer: Self-pay

## 2021-04-12 ENCOUNTER — Ambulatory Visit (INDEPENDENT_AMBULATORY_CARE_PROVIDER_SITE_OTHER): Payer: Medicaid Other

## 2021-04-12 ENCOUNTER — Ambulatory Visit (INDEPENDENT_AMBULATORY_CARE_PROVIDER_SITE_OTHER): Payer: Medicaid Other | Admitting: Women's Health

## 2021-04-12 VITALS — BP 124/75 | HR 94 | Wt 329.0 lb

## 2021-04-12 DIAGNOSIS — Z363 Encounter for antenatal screening for malformations: Secondary | ICD-10-CM | POA: Diagnosis not present

## 2021-04-12 DIAGNOSIS — Z3A23 23 weeks gestation of pregnancy: Secondary | ICD-10-CM | POA: Diagnosis not present

## 2021-04-12 DIAGNOSIS — Z8751 Personal history of pre-term labor: Secondary | ICD-10-CM

## 2021-04-12 DIAGNOSIS — O24410 Gestational diabetes mellitus in pregnancy, diet controlled: Secondary | ICD-10-CM

## 2021-04-12 DIAGNOSIS — O0992 Supervision of high risk pregnancy, unspecified, second trimester: Secondary | ICD-10-CM

## 2021-04-12 LAB — POCT URINALYSIS DIPSTICK OB
Blood, UA: NEGATIVE
Glucose, UA: NEGATIVE
Ketones, UA: NEGATIVE
Nitrite, UA: NEGATIVE
POC,PROTEIN,UA: NEGATIVE

## 2021-04-12 MED ORDER — ASPIRIN 81 MG PO TBEC: 81.0000 mg | DELAYED_RELEASE_TABLET | Freq: Every day | ORAL | 3 refills | Status: DC

## 2021-04-12 NOTE — Progress Notes (Signed)
HIGH-RISK PREGNANCY VISIT Patient name: Patricia [name] MRN [id number]  Date of birth: [birth year] Chief Complaint:   High Risk Gestation ([place] today)  History of Present Illness:   CHANICE BRENTON is a 30 y.o. 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  Today she reports not checking sugars, forgets.  Depression screen [facility] 2/9 02/17/2021 11/05/2019 02/12/2019 01/15/2019  Decreased Interest 0 1 0 0  Down, Depressed, Hopeless 2 1 0 0  PHQ - 2 Score 2 2 0 0  Altered sleeping 0 1 1 -  Tired, decreased energy 1 1 1  -  Change in appetite 1 1 1  -  Feeling bad or failure about yourself  2 1 0 -  Trouble concentrating 0 0 0 -  Moving slowly or fidgety/restless 0 0 0 -  Suicidal thoughts 0 0 0 -  PHQ-9 Score 6 6 3  -  Difficult doing work/chores - Not difficult at all - -  Some recent data might be hidden    Contractions: Not present. Vag. Bleeding: None.  Movement: Present. denies leaking of fluid.  [name]:   Pertinent items are noted in HPI Denies abnormal vaginal discharge w/ itching/odor/irritation, headaches, visual changes, shortness of breath, chest pain, abdominal pain, severe nausea/vomiting, or problems with urination or bowel movements unless otherwise stated above. Pertinent History Reviewed:  Reviewed past medical,surgical, social, obstetrical and family history.  Reviewed problem list, medications and allergies. Physical Assessment:   Vitals:   04/12/21 1421  BP: 124/75  Pulse: 94  Weight: (!) 329 lb (149.2 kg)  Body mass index is 51.53 kg/m.           Physical Examination:   General appearance: alert, well appearing, and in no distress  Mental status: alert, oriented to person, place, and time  Skin: warm & dry   Extremities: Edema: Trace    Cardiovascular: normal heart rate  noted  Respiratory: normal respiratory effort, no distress  Abdomen: gravid, soft, non-tender  Pelvic: Cervical exam deferred         Fetal Status: Fetal Heart Rate (bpm): 133 u/s   Movement: Present    Fetal Surveillance Testing today:  23+6 wks,breech,cx 5 cm,posterior placenta gr 0,SVP 7.7 cm,fhr 133 bpm,efw 665 g 55%,anatomy complete,no obvious abnormalities   Chaperone: N/A    Results for orders placed or performed in visit on 04/12/21 (from the past 24 hour(s))  POC Urinalysis Dipstick OB   Collection Time: 04/12/21  2:22 PM  Result Value Ref Range   Color, UA     Clarity, UA     Glucose, UA Negative Negative   Bilirubin, UA     Ketones, UA neg    Spec Grav, UA     Blood, UA neg    pH, UA     POC,PROTEIN,UA Negative Negative, Trace, Small (1+), Moderate (2+), Large (3+), 4+   Urobilinogen, UA     Nitrite, UA neg    Leukocytes, UA Moderate (2+) (A) Negative   Appearance     Odor      Assessment & Plan:  High-risk pregnancy: [place] at [redacted]w[redacted]d with an Estimated Date of Delivery: 08/03/21   1) 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 Discussed importance of knowing sugars. Promises to start checking QID, will set alarm to remind. Send me a pic  via mychart in 2wks (5/31). Start ASA today  2) H/O 35wk PTB d/t PPROM, declines meds  3) Interested in waterbirth> discussed if goes on meds for DM will not be candidate. Also if she is not checking sugars as directed she will not be candidate. Printed info given. Sign up for class  Meds:  Meds ordered this encounter  Medications  . aspirin 81 MG EC tablet    Sig: Take 1 tablet (81 mg total) by mouth daily. Swallow whole.    Dispense:  90 tablet    Refill:  3    Order Specific Question:   Supervising Provider    Answer:   Lazaro Arms [[id number]]   Labs/procedures today: U/S  Treatment Plan:  Growth u/s q4wks    No antenatal testing (as long as remains a1)     Deliver @ 39-40wks:____   Reviewed: Preterm labor  symptoms and general obstetric precautions including but not limited to vaginal bleeding, contractions, leaking of fluid and fetal movement were reviewed in detail with the patient.  All questions were answered.  Follow-up: Return in about 4 weeks (around 05/10/2021) for HROB, US:EFW, MD or CNM, in person.   No future appointments.  Orders Placed This Encounter  Procedures  . US OB Follow Up  . POC Urinalysis Dipstick OB   Cheral Marker CNM, [facility] 04/12/2021 3:09 PM

## 2021-04-12 NOTE — Progress Notes (Signed)
[place] 23+6 wks,breech,cx 5 cm,posterior placenta gr 0,SVP 7.7 cm,fhr 133 bpm,efw 665 g 55%,anatomy complete,no obvious abnormalities

## 2021-04-12 NOTE — Patient Instructions (Signed)
Patricia [name], I greatly value your feedback.  If you receive a survey following your visit with [place] today, we appreciate you taking the time to fill it out.  Thanks, Patricia [name], CNM, WHNP-BC  [facility] ([address] [place], [place] [address]) Entrance C, located off of [address] valet parking  Go to [email or web address] to register for FREE online childbirth classes   Call the office [phone]) or go to [facility] if:  You begin to have strong, frequent contractions  Your water breaks.  Sometimes it is a big gush of fluid, sometimes it is just a trickle that keeps getting your panties wet or running down your legs  You have vaginal bleeding.  It is normal to have a small amount of spotting if your cervix was checked.   You don't feel your baby moving like normal.  If you don't, get you something to eat and drink and lay down and focus on feeling your baby move.   If your baby is still not moving like normal, you should call the office or go to [facility].  [place] Pediatricians/Family Doctors:  [facility] [phone]            [facility] [phone]                 [facility] [phone] (usually not accepting new patients unless you have family there already, you are always welcome to call and ask)       [facility] Department [phone]       [facility] Pediatricians/Family Doctors:   [facility]: [phone]  [facility]: [phone]  [facility]: [phone]  [facility] Doctors:   [facility]: [phone]   [facility]: [phone]  [facility] Doctors:  [facility]: [phone]   Home Blood Pressure Monitoring for Patients   Your provider has recommended that you check your blood pressure (BP) at least once a week at home. If you do not have  a blood pressure cuff at home, one will be provided for you. Contact your provider if you have not received your monitor within 1 week.   Helpful Tips for Accurate Home Blood Pressure Checks  . Don't smoke, exercise, or drink caffeine 30 minutes before checking your BP . Use the restroom before checking your BP (a full bladder can raise your pressure) . Relax in a comfortable upright chair . Feet on the ground . Left arm resting comfortably on a flat surface at the level of your heart . Legs uncrossed . Back supported . Sit quietly and don't talk . Place the cuff on your bare arm . Adjust snuggly, so that only two fingertips can fit between your skin and the top of the cuff . Check 2 readings separated by at least one minute . Keep a log of your BP readings . For a visual, please reference this diagram: http://ccnc.care/bpdiagram  Provider Name: [facility]     Phone: [phone]  Zone 1: ALL CLEAR  Continue to monitor your symptoms:  . BP reading is less than 140 (top number) or less than 90 (bottom number)  . No right upper stomach pain . No headaches or seeing spots . No feeling nauseated or throwing up . No swelling in face and hands  Zone 2: CAUTION Call your doctor's office for any of the following:  . BP reading is greater than 140 (  top number) or greater than 90 (bottom number)  . Stomach pain under your ribs in the middle or right side . Headaches or seeing spots . Feeling nauseated or throwing up . Swelling in face and hands  Zone 3: EMERGENCY  Seek immediate medical care if you have any of the following:  . BP reading is greater than160 (top number) or greater than 110 (bottom number) . Severe headaches not improving with Tylenol . Serious difficulty catching your breath . Any worsening symptoms from Zone 2   Second Trimester of Pregnancy The second trimester is from week 13 through week 28, months 4 through 6. The second trimester is often a time when  you feel your best. Your body has also adjusted to being pregnant, and you begin to feel better physically. Usually, morning sickness has lessened or quit completely, you may have more energy, and you may have an increase in appetite. The second trimester is also a time when the fetus is growing rapidly. At the end of the sixth month, the fetus is about 9 inches long and weighs about 1 pounds. You will likely begin to feel the baby move (quickening) between 18 and 20 weeks of the pregnancy. BODY CHANGES Your body goes through many changes during pregnancy. The changes vary from woman to woman.   Your weight will continue to increase. You will notice your lower abdomen bulging out.  You may begin to get stretch marks on your hips, abdomen, and breasts.  You may develop headaches that can be relieved by medicines approved by your health care provider.  You may urinate more often because the fetus is pressing on your bladder.  You may develop or continue to have heartburn as a result of your pregnancy.  You may develop constipation because certain hormones are causing the muscles that push waste through your intestines to slow down.  You may develop hemorrhoids or swollen, bulging veins (varicose veins).  You may have back pain because of the weight gain and pregnancy hormones relaxing your joints between the bones in your pelvis and as a result of a shift in weight and the muscles that support your balance.  Your breasts will continue to grow and be tender.  Your gums may bleed and may be sensitive to brushing and flossing.  Dark spots or blotches (chloasma, mask of pregnancy) may develop on your face. This will likely fade after the baby is born.  A dark line from your belly button to the pubic area (linea nigra) may appear. This will likely fade after the baby is born.  You may have changes in your hair. These can include thickening of your hair, rapid growth, and changes in texture.  Some women also have hair loss during or after pregnancy, or hair that feels dry or thin. Your hair will most likely return to normal after your baby is born. WHAT TO EXPECT AT YOUR PRENATAL VISITS During a routine prenatal visit:  You will be weighed to make sure you and the fetus are growing normally.  Your blood pressure will be taken.  Your abdomen will be measured to track your baby's growth.  The fetal heartbeat will be listened to.  Any test results from the previous visit will be discussed. Your health care provider may ask you:  How you are feeling.  If you are feeling the baby move.  If you have had any abnormal symptoms, such as leaking fluid, bleeding, severe headaches, or abdominal cramping.  If you  have any questions. Other tests that may be performed during your second trimester include:  Blood tests that check for:  Low iron levels (anemia).  Gestational diabetes (between 24 and 28 weeks).  Rh antibodies.  Urine tests to check for infections, diabetes, or protein in the urine.  An ultrasound to confirm the proper growth and development of the baby.  An amniocentesis to check for possible genetic problems.  Fetal screens for spina bifida and Down syndrome. HOME CARE INSTRUCTIONS   Avoid all smoking, herbs, alcohol, and unprescribed drugs. These chemicals affect the formation and growth of the baby.  Follow your health care provider's instructions regarding medicine use. There are medicines that are either safe or unsafe to take during pregnancy.  Exercise only as directed by your health care provider. Experiencing uterine cramps is a good sign to stop exercising.  Continue to eat regular, healthy meals.  Wear a good support bra for breast tenderness.  Do not use hot tubs, steam rooms, or saunas.  Wear your seat belt at all times when driving.  Avoid raw meat, uncooked cheese, cat litter boxes, and soil used by cats. These carry germs that can  cause birth defects in the baby.  Take your prenatal vitamins.  Try taking a stool softener (if your health care provider approves) if you develop constipation. Eat more high-fiber foods, such as fresh vegetables or fruit and whole grains. Drink plenty of fluids to keep your urine clear or pale yellow.  Take warm sitz baths to soothe any pain or discomfort caused by hemorrhoids. Use hemorrhoid cream if your health care provider approves.  If you develop varicose veins, wear support hose. Elevate your feet for 15 minutes, 3-4 times a day. Limit salt in your diet.  Avoid heavy lifting, wear low heel shoes, and practice good posture.  Rest with your legs elevated if you have leg cramps or low back pain.  Visit your dentist if you have not gone yet during your pregnancy. Use a soft toothbrush to brush your teeth and be gentle when you floss.  A sexual relationship may be continued unless your health care provider directs you otherwise.  Continue to go to all your prenatal visits as directed by your health care provider. SEEK MEDICAL CARE IF:   You have dizziness.  You have mild pelvic cramps, pelvic pressure, or nagging pain in the abdominal area.  You have persistent nausea, vomiting, or diarrhea.  You have a bad smelling vaginal discharge.  You have pain with urination. SEEK IMMEDIATE MEDICAL CARE IF:   You have a fever.  You are leaking fluid from your vagina.  You have spotting or bleeding from your vagina.  You have severe abdominal cramping or pain.  You have rapid weight gain or loss.  You have shortness of breath with chest pain.  You notice sudden or extreme swelling of your face, hands, ankles, feet, or legs.  You have not felt your baby move in over an hour.  You have severe headaches that do not go away with medicine.  You have vision changes. Document Released: 11/07/2001 Document Revised: 11/18/2013 Document Reviewed: 01/14/2013 [facility] Patient  Information 2015 [place], [place]. This information is not intended to replace advice given to you by your health care provider. Make sure you discuss any questions you have with your health care provider.   Considering Waterbirth? Guide for patients at [facility]) Why consider waterbirth? . Gentle birth for babies  . Less pain medicine  used in labor  . May allow for passive descent/less pushing  . May reduce perineal tears  . More mobility and instinctive maternal position changes  . Increased maternal relaxation   Is waterbirth safe? What are the risks of infection, drowning or other complications? . Infection:  Marland Kitchen. Very low risk (3.7 % for tub vs 4.8% for bed)  . 7 in 8000 waterbirths with documented infection  . Poorly cleaned equipment most common cause  . Slightly lower group B strep transmission rate  . Drowning  . Maternal:  . Very low risk  . Related to seizures or fainting  . Newborn:  Marland Kitchen. Very low risk. No evidence of increased risk of respiratory problems in multiple large studies  . Physiological protection from breathing under water  . Avoid underwater birth if there are any fetal complications  . Once baby's head is out of the water, keep it out.  . Birth complication  . Some reports of cord trauma, but risk decreased by bringing baby to surface gradually  . No evidence of increased risk of shoulder dystocia. Mothers can usually change positions faster in water than in a bed, possibly aiding the maneuvers to free the shoulder.   There are 2 things you MUST do to have a waterbirth with [facility]: 1. Attend a [facility] class at [facility]   a. 3rd Wednesday of every month from 7-9 pm (virtual during COVID) b. Free c. Register online at [email or web address] or by calling [phone] d. Bring us the certificate from the class to your prenatal appointment or send via MyChart 2. Meet with a  midwife at 36 weeks* to see if you can still plan a waterbirth and to sign the consent.   *We also recommend that you schedule as many of your prenatal visits with a midwife as possible.    Helpful information: . You may want to bring a bathing suit top to the hospital to wear during labor but this is optional.  All other supplies are provided by the hospital. . Please arrive at the hospital with signs of active labor, and do not wait at home until late in labor. It takes 45 min- 2 hours for COVID testing, fetal monitoring, and check in to your room to take place, plus transport and filling of the waterbirth tub.    Things that would prevent you from having a waterbirth: . Unknown or Positive COVID-19 diagnosis upon admission to hospital* . Premature, <37wks  . Previous cesarean birth  . Presence of thick meconium-stained fluid  . Multiple gestation (Twins, triplets, etc.)  . Uncontrolled diabetes or gestational diabetes requiring medication  . Hypertension diagnosed in pregnancy or preexisting hypertension (gestational hypertension, preeclampsia, or chronic hypertension) . Fetal growth restriction (your baby measures less than 10th percentile on ultrasound) . Heavy vaginal bleeding  . Non-reassuring fetal heart rate  . Active infection (MRSA, etc.). Group B Strep is NOT a contraindication for waterbirth.  . If your labor has to be induced and induction method requires continuous monitoring of the baby's heart rate  . Other risks/issues identified by your obstetrical provider   Please remember that birth is unpredictable. Under certain unforeseeable circumstances your provider may advise against giving birth in the tub. These decisions will be made on a case-by-case basis and with the safety of you and your baby as our highest priority.   *Please remember that in order to have a waterbirth, you must test Negative to COVID-19  upon admission to the hospital.  Updated 03/07/21

## 2021-04-15 ENCOUNTER — Encounter: Payer: Self-pay | Admitting: *Deleted

## 2021-05-02 ENCOUNTER — Encounter: Payer: Self-pay | Admitting: Advanced Practice Midwife

## 2021-05-10 ENCOUNTER — Encounter (HOSPITAL_COMMUNITY): Payer: Self-pay | Admitting: Obstetrics & Gynecology

## 2021-05-10 ENCOUNTER — Inpatient Hospital Stay (HOSPITAL_COMMUNITY)
Admission: AD | Admit: 2021-05-10 | Discharge: 2021-05-10 | Disposition: A | Discharge status: Home / Self Care | Payer: Medicaid Other | Attending: Obstetrics & Gynecology | Admitting: Obstetrics & Gynecology

## 2021-05-10 DIAGNOSIS — Z6791 Unspecified blood type, Rh negative: Secondary | ICD-10-CM

## 2021-05-10 DIAGNOSIS — O99891 Other specified diseases and conditions complicating pregnancy: Secondary | ICD-10-CM

## 2021-05-10 DIAGNOSIS — Z3A28 28 weeks gestation of pregnancy: Secondary | ICD-10-CM | POA: Diagnosis not present

## 2021-05-10 DIAGNOSIS — N858 Other specified noninflammatory disorders of uterus: Secondary | ICD-10-CM | POA: Diagnosis not present

## 2021-05-10 DIAGNOSIS — R102 Pelvic and perineal pain: Secondary | ICD-10-CM | POA: Diagnosis present

## 2021-05-10 DIAGNOSIS — O26893 Other specified pregnancy related conditions, third trimester: Secondary | ICD-10-CM | POA: Diagnosis not present

## 2021-05-10 DIAGNOSIS — O26899 Other specified pregnancy related conditions, unspecified trimester: Secondary | ICD-10-CM

## 2021-05-10 DIAGNOSIS — N9489 Other specified conditions associated with female genital organs and menstrual cycle: Secondary | ICD-10-CM | POA: Diagnosis not present

## 2021-05-10 LAB — URINALYSIS, ROUTINE W REFLEX MICROSCOPIC
Bacteria, UA: NONE SEEN
Bilirubin Urine: NEGATIVE
Glucose, UA: NEGATIVE mg/dL
Hgb urine dipstick: NEGATIVE
Ketones, ur: 5 mg/dL — AB
Leukocytes,Ua: NEGATIVE
Nitrite: NEGATIVE
Protein, ur: 30 mg/dL — AB
Specific Gravity, Urine: 1.035 — ABNORMAL HIGH (ref 1.005–1.030)
pH: 5 (ref 5.0–8.0)

## 2021-05-10 LAB — FETAL FIBRONECTIN: Fetal Fibronectin: NEGATIVE

## 2021-05-10 MED ORDER — TRAMADOL HCL 50 MG PO TABS: 50.0000 mg | ORAL_TABLET | Freq: Four times a day (QID) | ORAL | 0 refills | Status: DC | PRN

## 2021-05-10 MED ORDER — NIFEDIPINE 10 MG PO CAPS
10.0000 mg | ORAL_CAPSULE | ORAL | Status: DC | PRN
  Administered 2021-05-10 (×3): 10 mg via ORAL
  Filled 2021-05-10 (×3): qty 1

## 2021-05-10 MED ORDER — LACTATED RINGERS IV BOLUS
1000.0000 mL | Freq: Once | INTRAVENOUS | Status: AC
  Administered 2021-05-10: 1000 mL via INTRAVENOUS

## 2021-05-10 NOTE — MAU Provider Note (Signed)
Chief Complaint:  No chief complaint on file.   Event Date/Time   First Provider Initiated Contact with Patient 05/10/21 2101     HPI: Patricia [name] is a 30 y.o. G6P3114 at 27w6dwho presents to maternity admissions reporting painful contractions for the past two weeks, worse today.  Hx 35 wk PPROM/PTL/PTD. She reports good fetal movement, denies LOF, vaginal bleeding, vaginal itching/burning, urinary symptoms, h/a, dizziness, n/v, diarrhea, constipation or fever/chills.    Abdominal Pain This is a new problem. The current episode started 1 to 4 weeks ago. The problem occurs intermittently. The problem has been unchanged. The quality of the pain is cramping. The abdominal pain does not radiate. Pertinent negatives include no constipation, diarrhea, dysuria, fever, frequency, myalgias, nausea or vomiting. Nothing aggravates the pain. The pain is relieved by Nothing. Treatments tried: hydration. The treatment provided no relief.    RN Note: PT SAYS HAS BEEN UC X2 WEEKS  TODAY AT WORK-HAD UC'S- ( FEELS PRESSURE AND PAIN IN HER VAGINA )  LASTING 2-3 MIN-- HAS HAD SEVERAL - DRINKING A LOT OF WATER- [facility]   Past Medical History: Past Medical History:  Diagnosis Date   Anemia    Anxiety    Bacterial vaginosis    Chlamydia    Depression    PP depression after 3rd pregnancy - resolved with therarpy   Migraine    Miscarriage 2019   Preterm labor    Preterm labor without delivery in second trimester June 2012   Positive FFN in June   UTI (lower urinary tract infection)     Past obstetric history: OB History  Gravida Para Term Preterm AB Living  6 4 3 1 1 4  SAB IAB Ectopic Multiple Live Births  1     0 4    # Outcome Date GA Lbr Len/2nd Weight Sex Delivery Anes PTL Lv  6 Current           5 Term 09/15/19 [redacted]w[redacted]d 13:05 / 00:04 3215 g F Vag-Spont EPI  LIV  4 SAB 02/02/18          3 Term 10/04/15 [redacted]w[redacted]d 03:47 / 00:08 2980 g F Vag-Spont None N LIV  2 Preterm 04/20/13  [redacted]w[redacted]d 10:57 / 00:03 2750 g M Vag-Spont EPI Y LIV     Complications: Preterm premature rupture of membranes (PPROM) delivered, current hospitalization  1 Term 07/31/11 [redacted]w[redacted]d 21:47 / 00:46 3630 g M Vag-Spont EPI N LIV     Complications: Gestational diabetes    Past Surgical History: Past Surgical History:  Procedure Laterality Date   CHOLECYSTECTOMY N/A 10/22/2020   Procedure: LAPAROSCOPIC CHOLECYSTECTOMY;  Surgeon: Bridges, Lindsay C, MD;  Location: [facility];  Service: General;  Laterality: N/A;   TUBAL LIGATION N/A 09/15/2019   Procedure: POST PARTUM TUBAL LIGATION;  Surgeon: Eure, Luther H, MD;  Location: [facility] LD ORS;  Service: Gynecology;  Laterality: N/A;    Family History: Family History  Problem Relation Age of Onset   Diabetes Other        great granmother   Diabetes Paternal Grandfather    Hypertension Paternal Grandfather    Cancer Paternal Grandfather        prostate   Miscarriages / Stillbirths Maternal Grandmother     Social History: Social History   Tobacco Use   Smoking status: Never   Smokeless tobacco: Never  Vaping Use   Vaping Use: Never used  Substance Use Topics   Alcohol use: Not Currently     Drug use: No    Allergies: No Known Allergies  Meds:  Medications Prior to Admission  Medication Sig Dispense Refill Last Dose   acetaminophen (TYLENOL) 500 MG tablet Take 500 mg by mouth every 6 (six) hours as needed.   05/10/2021 at 1730   aspirin 81 MG EC tablet Take 1 tablet (81 mg total) by mouth daily. Swallow whole. 90 tablet 3 05/10/2021   Blood Glucose Monitoring Suppl (ACCU-CHEK GUIDE ME) w/Device KIT 1 each by Does not apply route 4 (four) times daily. 1 kit 0 05/10/2021   glucose blood (ACCU-CHEK GUIDE) test strip Use as instructed to check blood sugar 4 times daily 50 each 12 05/10/2021   prenatal vitamin w/FE, FA (PRENATAL 1 + 1) 27-1 MG TABS tablet Take 1 tablet by mouth daily at 12 noon. 30 tablet 0 05/10/2021   sertraline (ZOLOFT) 50 MG tablet Take 1  tablet (50 mg total) by mouth daily. 90 tablet 3 05/10/2021   Accu-Chek Softclix Lancets lancets Use as instructed to check blood sugar 4 times daily 100 each 12    acetaminophen (TYLENOL) 325 MG tablet Take 2 tablets (650 mg total) by mouth every 6 (six) hours as needed for fever. 12 tablet 0     I have reviewed patient's Past Medical Hx, Surgical Hx, Family Hx, Social Hx, medications and allergies.   ROS:  [name]  Constitutional:  Negative for fever.  Gastrointestinal:  Positive for abdominal pain. Negative for constipation, diarrhea, nausea and vomiting.  Genitourinary:  Negative for dysuria and frequency.  Musculoskeletal:  Negative for myalgias.  Other systems negative  Physical Exam  Patient Vitals for the past 24 hrs:  BP Temp Temp src Pulse Resp Height Weight  05/10/21 2019 118/62 98.4 F (36.9 C) Oral (!) 102 18 5' 7" (1.702 m) (!) 149.6 kg   Constitutional: Well-developed, well-nourished female in no acute distress.  Cardiovascular: normal rate and rhythm Respiratory: normal effort, clear to auscultation bilaterally GI: Abd soft, non-tender, gravid appropriate for gestational age.   No rebound or guarding. MS: Extremities nontender, no edema, normal ROM Neurologic: Alert and oriented x 4.  GU: Neg CVAT.  PELVIC EXAM:   Cervix closed/long/soft/high   FHT:  Baseline 135 , moderate variability, accelerations present, no decelerations Contractions: Uterine irritability   Labs: Results for orders placed or performed during the hospital encounter of 05/10/21 (from the past 24 hour(s))  Fetal fibronectin     Status: None   Collection Time: 05/10/21  9:01 PM  Result Value Ref Range   Fetal Fibronectin NEGATIVE NEGATIVE  Urinalysis, Routine w reflex microscopic Urine, Clean Catch     Status: Abnormal   Collection Time: 05/10/21  9:36 PM  Result Value Ref Range   Color, Urine AMBER (A) YELLOW   APPearance HAZY (A) CLEAR   Specific Gravity, Urine 1.035 (H) 1.005  - 1.030   pH 5.0 5.0 - 8.0   Glucose, UA NEGATIVE NEGATIVE mg/dL   Hgb urine dipstick NEGATIVE NEGATIVE   Bilirubin Urine NEGATIVE NEGATIVE   Ketones, ur 5 (A) NEGATIVE mg/dL   Protein, ur 30 (A) NEGATIVE mg/dL   Nitrite NEGATIVE NEGATIVE   Leukocytes,Ua NEGATIVE NEGATIVE   RBC / HPF 0-5 0 - 5 RBC/hpf   WBC, UA 0-5 0 - 5 WBC/hpf   Bacteria, UA NONE SEEN NONE SEEN   Squamous Epithelial / LPF 0-5 0 - 5   Mucus PRESENT    Hyaline Casts, UA PRESENT     --/--/A NEG (  04/15 1419)  Imaging:  No results found.  MAU Course/MDM: I have ordered labs and reviewed results. Discussed fetal fibronectin and significance of negative result. NST reviewed, reactive with uterine irritability.  Treatments in MAU included IV fluid x 1 liter and 3 doses of procardia.  This did diminish uterine irritability.  .    Assessment: Single IUP at [redacted]w[redacted]d Uterine irritability Negative fetal fibronectin Pelvic pressure, likely due to advancing fetal size  Plan: Discharge home Small number of Tramadol prescribed for comfort Preterm Labor precautions and fetal kick counts Follow up in Office for prenatal visits  Encouraged to return if she develops worsening of symptoms, increase in pain, fever, or other concerning symptoms.   Pt stable at time of discharge.    CNM, MSN Certified Nurse-Midwife 05/10/2021 9:01 PM 

## 2021-05-10 NOTE — MAU Note (Addendum)
PT SAYS HAS BEEN UC X2 WEEKS   TODAY AT WORK-HAD UC'S- ( FEELS PRESSURE AND PAIN IN HER VAGINA )  LASTING 2-3 MIN-- HAS HAD SEVERAL - DRINKING A LOT OF WATER-  [facility]

## 2021-05-12 ENCOUNTER — Ambulatory Visit (INDEPENDENT_AMBULATORY_CARE_PROVIDER_SITE_OTHER): Payer: Medicaid Other | Admitting: Obstetrics & Gynecology

## 2021-05-12 ENCOUNTER — Other Ambulatory Visit: Payer: Self-pay

## 2021-05-12 ENCOUNTER — Encounter: Payer: Self-pay | Admitting: Obstetrics & Gynecology

## 2021-05-12 ENCOUNTER — Ambulatory Visit (INDEPENDENT_AMBULATORY_CARE_PROVIDER_SITE_OTHER): Payer: Medicaid Other

## 2021-05-12 VITALS — BP 143/82 | HR 82 | Wt 331.0 lb

## 2021-05-12 DIAGNOSIS — O0992 Supervision of high risk pregnancy, unspecified, second trimester: Secondary | ICD-10-CM

## 2021-05-12 DIAGNOSIS — O26899 Other specified pregnancy related conditions, unspecified trimester: Secondary | ICD-10-CM

## 2021-05-12 DIAGNOSIS — Z3A28 28 weeks gestation of pregnancy: Secondary | ICD-10-CM

## 2021-05-12 DIAGNOSIS — O09893 Supervision of other high risk pregnancies, third trimester: Secondary | ICD-10-CM

## 2021-05-12 DIAGNOSIS — O24410 Gestational diabetes mellitus in pregnancy, diet controlled: Secondary | ICD-10-CM

## 2021-05-12 DIAGNOSIS — O24419 Gestational diabetes mellitus in pregnancy, unspecified control: Secondary | ICD-10-CM

## 2021-05-12 MED ORDER — METFORMIN HCL 500 MG PO TABS: 500.0000 mg | ORAL_TABLET | Freq: Every day | ORAL | 2 refills | Status: DC

## 2021-05-12 NOTE — Progress Notes (Signed)
HIGH-RISK PREGNANCY VISIT Patient name: Patricia [name] MRN [id number]  Date of birth: Jun 27, 1991 Chief Complaint:   Routine Prenatal Visit  History of Present Illness:   Patricia [name] is a 30 y.o. 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    Today she reports no complaints. Contractions: Not present. Vag. Bleeding: None.  Movement: Present. denies leaking of fluid.   Depression screen [facility] 2/9 02/17/2021 11/05/2019 02/12/2019 01/15/2019  Decreased Interest 0 1 0 0  Down, Depressed, Hopeless 2 1 0 0  PHQ - 2 Score 2 2 0 0  Altered sleeping 0 1 1 -  Tired, decreased energy 1 1 1  -  Change in appetite 1 1 1  -  Feeling bad or failure about yourself  2 1 0 -  Trouble concentrating 0 0 0 -  Moving slowly or fidgety/restless 0 0 0 -  Suicidal thoughts 0 0 0 -  PHQ-9 Score 6 6 3  -  Difficult doing work/chores - Not difficult at all - -  Some recent data might be hidden     GAD 7 : Generalized Anxiety Score 02/17/2021  Nervous, Anxious, on Edge 1  Control/stop worrying 1  Worry too much - different things 1  Trouble relaxing 0  Restless 0  Easily annoyed or irritable 0  Afraid - awful might happen 0  Total GAD 7 Score 3     [name]:   Pertinent items are noted in HPI Denies abnormal vaginal discharge w/ itching/odor/irritation, headaches, visual changes, shortness of breath, chest pain, abdominal pain, severe nausea/vomiting, or problems with urination or bowel movements unless otherwise stated above. Pertinent History Reviewed:  Reviewed past medical,surgical, social, obstetrical and family history.  Reviewed problem list, medications and allergies. Physical Assessment:   Vitals:   05/12/21 1052  BP: (!) 143/82  Pulse: 82  Weight: (!) 331 lb (150.1 kg)  Body mass index is 51.84  kg/m.           Physical Examination:   General appearance: alert, well appearing, and in no distress  Mental status: alert, oriented to person, place, and time  Skin: warm & dry   Extremities: Edema: Mild pitting, slight indentation    Cardiovascular: normal heart rate noted  Respiratory: normal respiratory effort, no distress  Abdomen: gravid, soft, non-tender  Pelvic: Cervical exam deferred         Fetal Status:     Movement: Present    Fetal Surveillance Testing today: sonogram   Chaperone: N/A    No results found for this or any previous visit (from the past 24 hour(s)).  Assessment & Plan:  High-risk pregnancy: at [redacted]w[redacted]d with an Estimated Date of Delivery: 08/03/21   1) Class A2/B DM, begin metformin 500 qhs    Meds:  Meds ordered this encounter  Medications   metFORMIN (GLUCOPHAGE) 500 MG tablet    Sig: Take 1 tablet (500 mg total) by mouth at bedtime.    Dispense:  30 tablet    Refill:  2    Labs/procedures today: U/S  Treatment Plan:  begin twice weekly at 32 weeks  Reviewed: Preterm labor symptoms and general obstetric precautions including but not limited to vaginal bleeding, contractions, leaking of fluid and fetal movement were reviewed in detail with the patient.  All questions were answered. Does have  home bp cuff. Office bp cuff given: not applicable. Check bp weekly, let us know if consistently >140 and/or >90.  Follow-up: Return in about 1 week (around 05/19/2021) for  1 week RhoGAM INJECTION, 3 weeks OB visit.   No future appointments.  Orders Placed This Encounter  Procedures   CBC   RPR   HIV Antibody (routine testing w rflx)   Antibody screen   Lazaro Arms  05/12/2021 11:27 AM

## 2021-05-12 NOTE — Progress Notes (Signed)
[place] 28+1 wks,cephalic,cx 3.8 cm,posterior placenta gr 0,normal ovaries,efw 135 bpm,afi 15.9 cm,EFW 1134 g 27%

## 2021-05-13 LAB — CBC
Hematocrit: 29.7 % — ABNORMAL LOW (ref 34.0–46.6)
Hemoglobin: 9.9 g/dL — ABNORMAL LOW (ref 11.1–15.9)
MCH: 27.7 pg (ref 26.6–33.0)
MCHC: 33.3 g/dL (ref 31.5–35.7)
MCV: 83 fL (ref 79–97)
Platelets: 176 10*3/uL (ref 150–450)
RBC: 3.58 x10E6/uL — ABNORMAL LOW (ref 3.77–5.28)
RDW: 13 % (ref 11.7–15.4)
WBC: 6.5 10*3/uL (ref 3.4–10.8)

## 2021-05-13 LAB — HIV ANTIBODY (ROUTINE TESTING W REFLEX): HIV Screen 4th Generation wRfx: NONREACTIVE

## 2021-05-13 LAB — ANTIBODY SCREEN: Antibody Screen: NEGATIVE

## 2021-05-13 LAB — RPR: RPR Ser Ql: NONREACTIVE

## 2021-05-19 ENCOUNTER — Ambulatory Visit: Payer: Medicaid Other

## 2021-05-23 ENCOUNTER — Other Ambulatory Visit: Payer: Self-pay

## 2021-05-23 ENCOUNTER — Other Ambulatory Visit (INDEPENDENT_AMBULATORY_CARE_PROVIDER_SITE_OTHER): Payer: Medicaid Other

## 2021-05-23 DIAGNOSIS — O26893 Other specified pregnancy related conditions, third trimester: Secondary | ICD-10-CM | POA: Diagnosis not present

## 2021-05-23 DIAGNOSIS — Z6791 Unspecified blood type, Rh negative: Secondary | ICD-10-CM | POA: Diagnosis not present

## 2021-05-23 DIAGNOSIS — Z3A29 29 weeks gestation of pregnancy: Secondary | ICD-10-CM | POA: Diagnosis not present

## 2021-05-23 NOTE — Progress Notes (Signed)
   NURSE VISIT- INJECTION  SUBJECTIVE:  Patricia [name] is a 30 y.o. [phone] female here for a Rhophylac for Rh neg status during pregnancy. She is [redacted]w[redacted]d pregnant.   OBJECTIVE:  LMP 10/27/2020 Comment: tubal  Appears well, in no apparent distress  Injection administered in: Right upper quad. gluteus  No orders of the defined types were placed in this encounter.   ASSESSMENT: Pregnancy [redacted]w[redacted]d Rhophylac for Rh neg status during pregnancy PLAN: Follow-up: as scheduled   Eulia Hatcher A Rayan Dyal  05/23/2021 9:10 AM

## 2021-05-27 LAB — URINE CULTURE

## 2021-05-31 ENCOUNTER — Other Ambulatory Visit: Payer: Self-pay | Admitting: Women's Health

## 2021-05-31 ENCOUNTER — Encounter: Payer: Self-pay | Admitting: Women's Health

## 2021-05-31 DIAGNOSIS — O2343 Unspecified infection of urinary tract in pregnancy, third trimester: Secondary | ICD-10-CM | POA: Insufficient documentation

## 2021-05-31 MED ORDER — NITROFURANTOIN MONOHYD MACRO 100 MG PO CAPS: 100.0000 mg | ORAL_CAPSULE | Freq: Two times a day (BID) | ORAL | 0 refills | Status: DC

## 2021-06-02 ENCOUNTER — Encounter: Payer: Medicaid Other | Admitting: Women's Health

## 2021-06-07 ENCOUNTER — Ambulatory Visit (INDEPENDENT_AMBULATORY_CARE_PROVIDER_SITE_OTHER): Payer: BC Managed Care – PPO | Admitting: Women's Health

## 2021-06-07 ENCOUNTER — Other Ambulatory Visit: Payer: Self-pay

## 2021-06-07 ENCOUNTER — Other Ambulatory Visit (HOSPITAL_COMMUNITY)
Admission: RE | Admit: 2021-06-07 | Discharge: 2021-06-07 | Disposition: A | Discharge status: Home / Self Care | Payer: BC Managed Care – PPO | Source: Ambulatory Visit | Attending: Obstetrics & Gynecology | Admitting: Obstetrics & Gynecology

## 2021-06-07 ENCOUNTER — Encounter: Payer: Self-pay | Admitting: Women's Health

## 2021-06-07 VITALS — BP 119/69 | HR 93 | Wt 330.0 lb

## 2021-06-07 DIAGNOSIS — Z3A31 31 weeks gestation of pregnancy: Secondary | ICD-10-CM | POA: Diagnosis not present

## 2021-06-07 DIAGNOSIS — O24419 Gestational diabetes mellitus in pregnancy, unspecified control: Secondary | ICD-10-CM | POA: Diagnosis not present

## 2021-06-07 DIAGNOSIS — O24415 Gestational diabetes mellitus in pregnancy, controlled by oral hypoglycemic drugs: Secondary | ICD-10-CM | POA: Diagnosis not present

## 2021-06-07 DIAGNOSIS — O2343 Unspecified infection of urinary tract in pregnancy, third trimester: Secondary | ICD-10-CM | POA: Insufficient documentation

## 2021-06-07 DIAGNOSIS — Z5320 Procedure and treatment not carried out because of patient's decision for unspecified reasons: Secondary | ICD-10-CM | POA: Diagnosis not present

## 2021-06-07 DIAGNOSIS — O09213 Supervision of pregnancy with history of pre-term labor, third trimester: Secondary | ICD-10-CM | POA: Insufficient documentation

## 2021-06-07 DIAGNOSIS — O0993 Supervision of high risk pregnancy, unspecified, third trimester: Secondary | ICD-10-CM | POA: Diagnosis not present

## 2021-06-07 DIAGNOSIS — B9689 Other specified bacterial agents as the cause of diseases classified elsewhere: Secondary | ICD-10-CM | POA: Insufficient documentation

## 2021-06-07 DIAGNOSIS — O4693 Antepartum hemorrhage, unspecified, third trimester: Secondary | ICD-10-CM

## 2021-06-07 DIAGNOSIS — O23593 Infection of other part of genital tract in pregnancy, third trimester: Secondary | ICD-10-CM | POA: Insufficient documentation

## 2021-06-07 LAB — POCT URINALYSIS DIPSTICK OB
Blood, UA: NEGATIVE
Glucose, UA: NEGATIVE
Ketones, UA: NEGATIVE
Leukocytes, UA: NEGATIVE
Nitrite, UA: NEGATIVE
POC,PROTEIN,UA: NEGATIVE

## 2021-06-07 NOTE — Patient Instructions (Signed)
Renaee Munda, thank you for choosing our office today! We appreciate the opportunity to meet your healthcare needs. You may receive a short survey by mail, e-mail, or through [employer]. If you are happy with your care we would appreciate if you could take just a few minutes to complete the survey questions. We read all of your comments and take your feedback very seriously. Thank you again for choosing our office.  Center for [employer] Team at [facility]  [facility] at [facility] ([address] [place], [place] [address]) Entrance C, located off of [address] Free 24/7 valet parking   CLASSES: Go to [email or web address] to register for classes (childbirth, breastfeeding, waterbirth, infant CPR, daddy bootcamp, etc.)  Call the office [phone]) or go to [facility] if: You begin to have strong, frequent contractions Your water breaks.  Sometimes it is a big gush of fluid, sometimes it is just a trickle that keeps getting your panties wet or running down your legs You have vaginal bleeding.  It is normal to have a small amount of spotting if your cervix was checked.  You don't feel your baby moving like normal.  If you don't, get you something to eat and drink and lay down and focus on feeling your baby move.   If your baby is still not moving like normal, you should call the office or go to [facility].  Call the office [phone]) or go to [facility] hospital for these signs of pre-eclampsia: Severe headache that does not go away with Tylenol Visual changes- seeing spots, double, blurred vision Pain under your right breast or upper abdomen that does not go away with Tums or heartburn medicine Nausea and/or vomiting Severe swelling in your hands, feet, and face   Tdap Vaccine It is recommended that you get the Tdap vaccine during the third trimester of EACH pregnancy to help protect your baby from getting pertussis (whooping cough) 27-36 weeks is the BEST time to do  this so that you can pass the protection on to your baby. During pregnancy is better than after pregnancy, but if you are unable to get it during pregnancy it will be offered at the hospital.  You can get this vaccine with [place], at the health department, your family doctor, or some local pharmacies Everyone who will be around your baby should also be up-to-date on their vaccines before the baby comes. Adults (who are not pregnant) only need 1 dose of Tdap during adulthood.   [facility] Pediatricians/Family Doctors [facility]): [address] Colette Ribas, [phone]           [facility]: [address], [phone]                [facility]): [address], [phone] (call to ask if accepting patients) [facility] Department: [address], [place], [phone]    [facility] Pediatricians/Family Doctors [facility]): [phone], [phone] [facility]: [address] [place], [phone] [facility]: [address], [phone]  [facility] Doctors  [facility]): [phone] [facility]: [address], [phone]   [facility] Doctors [facility]: [address] [phone]  [facility] Family Doctors  [facility]: [phone], [phone]  Home Blood Pressure Monitoring for Patients   Your provider has recommended that you check your  blood pressure (BP) at least once a week at home. If you do not have a blood pressure cuff at home, one will be provided for you. Contact your provider if you have not received your monitor within 1 week.   Helpful Tips for Accurate Home Blood Pressure Checks  Don't smoke, exercise, or drink caffeine 30 minutes before checking your BP Use the restroom before checking your BP (a full bladder can raise your  pressure) Relax in a comfortable upright chair Feet on the ground Left arm resting comfortably on a flat surface at the level of your heart Legs uncrossed Back supported Sit quietly and don't talk Place the cuff on your bare arm Adjust snuggly, so that only two fingertips can fit between your skin and the top of the cuff Check 2 readings separated by at least one minute Keep a log of your BP readings For a visual, please reference this diagram: http://ccnc.care/bpdiagram  Provider Name: [facility]     Phone: [phone]  Zone 1: ALL CLEAR  Continue to monitor your symptoms:  BP reading is less than 140 (top number) or less than 90 (bottom number)  No right upper stomach pain No headaches or seeing spots No feeling nauseated or throwing up No swelling in face and hands  Zone 2: CAUTION Call your doctor's office for any of the following:  BP reading is greater than 140 (top number) or greater than 90 (bottom number)  Stomach pain under your ribs in the middle or right side Headaches or seeing spots Feeling nauseated or throwing up Swelling in face and hands  Zone 3: EMERGENCY  Seek immediate medical care if you have any of the following:  BP reading is greater than160 (top number) or greater than 110 (bottom number) Severe headaches not improving with Tylenol Serious difficulty catching your breath Any worsening symptoms from Zone 2  Preterm Labor and Birth Information  The normal length of a pregnancy is 39-41 weeks. Preterm labor is when labor starts before 37 completed weeks of pregnancy. What are the risk factors for preterm labor? Preterm labor is more likely to occur in women who: Have certain infections during pregnancy such as a bladder infection, sexually transmitted infection, or infection inside the uterus (chorioamnionitis). Have a shorter-than-normal cervix. Have gone into preterm labor before. Have had surgery on their cervix. Are younger than age 17  or older than age 35. Are African American. Are pregnant with twins or multiple babies (multiple gestation). Take street drugs or smoke while pregnant. Do not gain enough weight while pregnant. Became pregnant shortly after having been pregnant. What are the symptoms of preterm labor? Symptoms of preterm labor include: Cramps similar to those that can happen during a menstrual period. The cramps may happen with diarrhea. Pain in the abdomen or lower back. Regular uterine contractions that may feel like tightening of the abdomen. A feeling of increased pressure in the pelvis. Increased watery or bloody mucus discharge from the vagina. Water breaking (ruptured amniotic sac). Why is it important to recognize signs of preterm labor? It is important to recognize signs of preterm labor because babies who are born prematurely may not be fully developed. This can put them at an increased risk for: Long-term (chronic) heart and lung problems. Difficulty immediately after birth with regulating body systems, including blood sugar, body temperature, heart rate, and breathing rate. Bleeding in the brain. Cerebral palsy. Learning difficulties. Death. These risks are highest for babies who are born before 34 weeks   of pregnancy. How is preterm labor treated? Treatment depends on the length of your pregnancy, your condition, and the health of your baby. It may involve: Having a stitch (suture) placed in your cervix to prevent your cervix from opening too early (cerclage). Taking or being given medicines, such as: Hormone medicines. These may be given early in pregnancy to help support the pregnancy. Medicine to stop contractions. Medicines to help mature the baby's lungs. These may be prescribed if the risk of delivery is high. Medicines to prevent your baby from developing cerebral palsy. If the labor happens before 34 weeks of pregnancy, you may need to stay in the hospital. What should I do if I  think I am in preterm labor? If you think that you are going into preterm labor, call your health care provider right away. How can I prevent preterm labor in future pregnancies? To increase your chance of having a full-term pregnancy: Do not use any tobacco products, such as cigarettes, chewing tobacco, and e-cigarettes. If you need help quitting, ask your health care provider. Do not use street drugs or medicines that have not been prescribed to you during your pregnancy. Talk with your health care provider before taking any herbal supplements, even if you have been taking them regularly. Make sure you gain a healthy amount of weight during your pregnancy. Watch for infection. If you think that you might have an infection, get it checked right away. Make sure to tell your health care provider if you have gone into preterm labor before. This information is not intended to replace advice given to you by your health care provider. Make sure you discuss any questions you have with your health care provider. Document Revised: 03/07/2019 Document Reviewed: 04/05/2016 Elsevier Patient Education  2020 Elsevier Inc.   

## 2021-06-07 NOTE — Progress Notes (Signed)
HIGH-RISK PREGNANCY VISIT Patient name: Patricia [name] MRN [id number]  Date of birth: 10/12/1991 Chief Complaint:   Routine Prenatal Visit  History of Present Illness:   Patricia [name] is a 30 y.o. 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    Today she reports  all sugars wnl. Intermittent bright red blood in toilet/when wiping x ~24mth, more consistent for past week or so. No sex in >94mth . Denies abnormal discharge, itching/odor/irritation. Some pressure. Irregular uc's, about 4 since waking up 3.5hrs ago. Denies LOF. Finishing up macrobid rx'd for UTI last week, sx gone.  Contractions: Irritability. Vag. Bleeding: Small.  Movement: Present.   Depression screen [facility] 2/9 02/17/2021 11/05/2019 02/12/2019 01/15/2019  Decreased Interest 0 1 0 0  Down, Depressed, Hopeless 2 1 0 0  PHQ - 2 Score 2 2 0 0  Altered sleeping 0 1 1 -  Tired, decreased energy 1 1 1  -  Change in appetite 1 1 1  -  Feeling bad or failure about yourself  2 1 0 -  Trouble concentrating 0 0 0 -  Moving slowly or fidgety/restless 0 0 0 -  Suicidal thoughts 0 0 0 -  PHQ-9 Score 6 6 3  -  Difficult doing work/chores - Not difficult at all - -  Some recent data might be hidden     GAD 7 : Generalized Anxiety Score 02/17/2021  Nervous, Anxious, on Edge 1  Control/stop worrying 1  Worry too much - different things 1  Trouble relaxing 0  Restless 0  Easily annoyed or irritable 0  Afraid - awful might happen 0  Total GAD 7 Score 3     [name]:   Pertinent items are noted in HPI Denies abnormal vaginal discharge w/ itching/odor/irritation, headaches, visual changes, shortness of breath, chest pain, abdominal pain, severe nausea/vomiting, or problems with urination or bowel movements unless otherwise stated above. Pertinent History Reviewed:   Reviewed past medical,surgical, social, obstetrical and family history.  Reviewed problem list, medications and allergies. Physical Assessment:   Vitals:   06/07/21 1120  BP: 119/69  Pulse: 93  Weight: (!) 330 lb (149.7 kg)  Body mass index is 51.69 kg/m.           Physical Examination:   General appearance: alert, well appearing, and in no distress  Mental status: alert, oriented to person, place, and time  Skin: warm & dry   Extremities: Edema: Mild pitting, slight indentation    Cardiovascular: normal heart rate noted  Respiratory: normal respiratory effort, no distress  Abdomen: gravid, soft, non-tender  Pelvic:  Spec exam: cx visually long/closed, milky pink d/c, fFN and CV swab collected. SVE: outer os 2-3, inner os closed/thick/ballotable          Fetal Status: Fetal Heart Rate (bpm): 142 Fundal Height: 35 cm Movement: Present    Fetal Surveillance Testing today: doppler   Chaperone:    Results for orders placed or performed in visit on 06/07/21 (from the past 24 hour(s))  POC Urinalysis Dipstick OB   Collection Time: 06/07/21 11:22 AM  Result Value Ref Range   Color, UA     Clarity, UA     Glucose, UA Negative Negative   Bilirubin, UA     Ketones, UA neg    Spec Grav, UA     Blood,  UA neg    pH, UA     POC,PROTEIN,UA Negative Negative, Trace, Small (1+), Moderate (2+), Large (3+), 4+   Urobilinogen, UA     Nitrite, UA neg    Leukocytes, UA Negative Negative   Appearance     Odor      Assessment & Plan:  High-risk pregnancy: [id number] at [redacted]w[redacted]d with an Estimated Date of Delivery: 08/03/21   1) A2/BDM, stable on metformin 500mg  qhs, ASA  2) H/O 35wk PTB , d/t PPROM, declined Makena  3) Spotting w/ pressure & infrequent uc's> no active bleeding on exam, cx stable, fFN and CV swab sent, work note until Monday. Pelvic rest. Discussed w/ LHE, nothing else needed right now. Reviewed ptl s/s, reasons to seek care.   4) Recent UTI>almost finished w/  macrobid, sx resolved, plan urine cx next visit  Meds: No orders of the defined types were placed in this encounter.   Labs/procedures today: spec exam, CV swab, SVE, tdap, and fFN  Treatment Plan:  Growth u/s q4wks    2x/wk testing @ 32wks or weekly BPP    Deliver @ 39-39.6wks:______   Reviewed: Preterm labor symptoms and general obstetric precautions including but not limited to vaginal bleeding, contractions, leaking of fluid and fetal movement were reviewed in detail with the patient.  All questions were answered.   Follow-up: Return for thurs efw/bpp u/s, then mon hrob w/ md/cnm and nst; then thurs nst/nurse.   Future Appointments  Date Time Provider Department Center  06/09/2021  4:00 PM [facility] None  06/13/2021 10:10 AM 06/15/2021, MD [facility]  06/13/2021 10:50 AM [facility] NURSE [facility]  06/16/2021  2:30 PM [facility] NURSE [facility]  06/20/2021  3:10 PM 06/22/2021, DO [facility]  06/23/2021  9:50 AM [facility] NURSE [facility]  06/27/2021  2:50 PM 08/27/2021, MD [facility]  06/30/2021 11:10 AM [facility] NURSE [facility]  07/04/2021 11:10 AM 09/03/2021, CNM [facility]  07/07/2021 10:10 AM [facility] NURSE [facility]  07/11/2021 10:50 AM 07/13/2021, CNM [facility]  07/14/2021 10:10 AM [facility] NURSE [facility]  07/18/2021 10:10 AM 07/20/2021, CNM [facility]  07/25/2021  2:50 PM 07/27/2021, MD [facility]  08/02/2021 11:10 AM Eure, 10/02/2021, MD [facility]    Orders Placed This Encounter  Procedures   Amaryllis Dyke OB Follow Up   US FETAL BPP WO NON STRESS   Fetal fibronectin   POC Urinalysis Dipstick OB   US CNM, [facility] 06/07/2021 12:31 PM

## 2021-06-08 ENCOUNTER — Telehealth: Payer: Self-pay | Admitting: Adult Health

## 2021-06-08 DIAGNOSIS — A749 Chlamydial infection, unspecified: Secondary | ICD-10-CM

## 2021-06-08 LAB — FETAL FIBRONECTIN: Fetal Fibronectin: NEGATIVE

## 2021-06-08 LAB — CERVICOVAGINAL ANCILLARY ONLY
Bacterial Vaginitis (gardnerella): POSITIVE — AB
Candida Glabrata: NEGATIVE
Candida Vaginitis: NEGATIVE
Chlamydia: POSITIVE — AB
Comment: NEGATIVE
Comment: NEGATIVE
Comment: NEGATIVE
Comment: NEGATIVE
Comment: NEGATIVE
Comment: NORMAL
Neisseria Gonorrhea: NEGATIVE
Trichomonas: NEGATIVE

## 2021-06-08 MED ORDER — METRONIDAZOLE 500 MG PO TABS: 500.0000 mg | ORAL_TABLET | Freq: Two times a day (BID) | ORAL | 0 refills | Status: DC

## 2021-06-08 MED ORDER — AZITHROMYCIN 500 MG PO TABS: ORAL_TABLET | ORAL | 0 refills | Status: DC

## 2021-06-08 NOTE — Telephone Encounter (Signed)
Pt aware +BV and chlamydia, rx azithromycin and flagyl,no sex, partner needs to be treated let me know if I need to treat and [facility] sent

## 2021-06-08 NOTE — Telephone Encounter (Signed)
Left message that fetal fibronectin was negative and vaginal swab not back yet

## 2021-06-09 ENCOUNTER — Other Ambulatory Visit: Payer: BC Managed Care – PPO

## 2021-06-09 ENCOUNTER — Ambulatory Visit (INDEPENDENT_AMBULATORY_CARE_PROVIDER_SITE_OTHER): Payer: BC Managed Care – PPO

## 2021-06-09 ENCOUNTER — Other Ambulatory Visit: Payer: Self-pay

## 2021-06-09 DIAGNOSIS — O24419 Gestational diabetes mellitus in pregnancy, unspecified control: Secondary | ICD-10-CM

## 2021-06-09 DIAGNOSIS — Z3A32 32 weeks gestation of pregnancy: Secondary | ICD-10-CM | POA: Diagnosis not present

## 2021-06-09 DIAGNOSIS — O0993 Supervision of high risk pregnancy, unspecified, third trimester: Secondary | ICD-10-CM | POA: Diagnosis not present

## 2021-06-09 DIAGNOSIS — O26899 Other specified pregnancy related conditions, unspecified trimester: Secondary | ICD-10-CM

## 2021-06-09 DIAGNOSIS — Z6791 Unspecified blood type, Rh negative: Secondary | ICD-10-CM

## 2021-06-09 NOTE — Progress Notes (Signed)
[place] 32+1 wks,cephalic,BPP 8/8,CX 4.4 cm,fhr 157 bpm,posterior placenta gr 0,afi 15.9 cm,EFW 2057 g 61%

## 2021-06-13 ENCOUNTER — Ambulatory Visit (INDEPENDENT_AMBULATORY_CARE_PROVIDER_SITE_OTHER): Payer: BC Managed Care – PPO | Admitting: Obstetrics & Gynecology

## 2021-06-13 ENCOUNTER — Other Ambulatory Visit: Payer: BC Managed Care – PPO

## 2021-06-13 ENCOUNTER — Other Ambulatory Visit: Payer: BC Managed Care – PPO | Admitting: Obstetrics & Gynecology

## 2021-06-13 ENCOUNTER — Encounter: Payer: Self-pay | Admitting: Obstetrics & Gynecology

## 2021-06-13 ENCOUNTER — Other Ambulatory Visit: Payer: Self-pay

## 2021-06-13 VITALS — BP 130/80 | HR 95 | Wt 330.0 lb

## 2021-06-13 DIAGNOSIS — O24419 Gestational diabetes mellitus in pregnancy, unspecified control: Secondary | ICD-10-CM

## 2021-06-13 DIAGNOSIS — O09893 Supervision of other high risk pregnancies, third trimester: Secondary | ICD-10-CM

## 2021-06-13 DIAGNOSIS — Z3A32 32 weeks gestation of pregnancy: Secondary | ICD-10-CM

## 2021-06-13 LAB — POCT URINALYSIS DIPSTICK OB
Blood, UA: NEGATIVE
Glucose, UA: NEGATIVE
Ketones, UA: NEGATIVE
Leukocytes, UA: NEGATIVE
Nitrite, UA: NEGATIVE
POC,PROTEIN,UA: NEGATIVE

## 2021-06-13 NOTE — Progress Notes (Signed)
HIGH-RISK PREGNANCY VISIT Patient name: Patricia [name] MRN [id number]  Date of birth: 05-27-1991 Chief Complaint:   Routine Prenatal Visit (NST)  History of Present Illness:   Patricia [name] is a 30 y.o. 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    Today she reports no complaints. Contractions: Irritability. Vag. Bleeding: None.  Movement: Present. denies leaking of fluid.   Depression screen [facility] 2/9 02/17/2021 11/05/2019 02/12/2019 01/15/2019  Decreased Interest 0 1 0 0  Down, Depressed, Hopeless 2 1 0 0  PHQ - 2 Score 2 2 0 0  Altered sleeping 0 1 1 -  Tired, decreased energy 1 1 1  -  Change in appetite 1 1 1  -  Feeling bad or failure about yourself  2 1 0 -  Trouble concentrating 0 0 0 -  Moving slowly or fidgety/restless 0 0 0 -  Suicidal thoughts 0 0 0 -  PHQ-9 Score 6 6 3  -  Difficult doing work/chores - Not difficult at all - -  Some recent data might be hidden     GAD 7 : Generalized Anxiety Score 02/17/2021  Nervous, Anxious, on Edge 1  Control/stop worrying 1  Worry too much - different things 1  Trouble relaxing 0  Restless 0  Easily annoyed or irritable 0  Afraid - awful might happen 0  Total GAD 7 Score 3     [name]:   Pertinent items are noted in HPI Denies abnormal vaginal discharge w/ itching/odor/irritation, headaches, visual changes, shortness of breath, chest pain, abdominal pain, severe nausea/vomiting, or problems with urination or bowel movements unless otherwise stated above. Pertinent History Reviewed:  Reviewed past medical,surgical, social, obstetrical and family history.  Reviewed problem list, medications and allergies. Physical Assessment:   Vitals:   06/13/21 1022  BP: 130/80  Pulse: 95  Weight: (!) 330 lb (149.7 kg)  Body mass index is 51.69 kg/m.            Physical Examination:   General appearance: alert, well appearing, and in no distress  Mental status: alert, oriented to person, place, and time  Skin: warm & dry   Extremities: Edema: Mild pitting, slight indentation    Cardiovascular: normal heart rate noted  Respiratory: normal respiratory effort, no distress  Abdomen: gravid, soft, non-tender  Pelvic: Cervical exam deferred         Fetal Status:     Movement: Present    Fetal Surveillance Testing today: reactive NST  Patricia [name] is at [redacted]w[redacted]d Estimated Date of Delivery: 08/03/21  NST being performed due to A2/B DM  Today the NST is Reactive  Fetal Monitoring:  Baseline: 140 bpm, Variability: Good {> 6 bpm), Accelerations: Reactive, and Decelerations: Absent   reactive  The accelerations are >15 bpm and more than 2 in 20 minutes  Final diagnosis:  Reactive NST  Ricky Ala, MD     Chaperone: N/A    Results for orders placed or performed in visit on 06/13/21 (from the past 24 hour(s))  POC Urinalysis Dipstick OB   Collection Time: 06/13/21 10:25 AM  Result Value Ref Range   Color, UA     Clarity, UA     Glucose, UA Negative Negative   Bilirubin, UA     Ketones, UA neg    Spec Grav, UA  Blood, UA neg    pH, UA     POC,PROTEIN,UA Negative Negative, Trace, Small (1+), Moderate (2+), Large (3+), 4+   Urobilinogen, UA     Nitrite, UA neg    Leukocytes, UA Negative Negative   Appearance     Odor      Assessment & Plan:  High-risk pregnancy: [id number] at [redacted]w[redacted]d with an Estimated Date of Delivery: 08/03/21   1) A2/B DM well controlled on metformin 500 hs,   2) H/O modified [place] BTL, interval salpingectomy is planned,   Meds: No orders of the defined types were placed in this encounter.   Labs/procedures today: NST  Treatment Plan:  keep  Reviewed: Preterm labor symptoms and general obstetric precautions including but not limited to vaginal bleeding, contractions, leaking of fluid and fetal movement were  reviewed in detail with the patient.  All questions were answered. Does have home bp cuff. Office bp cuff given: not applicable. Check bp daily, let us know if consistently >140 and/or >90.  Follow-up: No follow-ups on file.   Future Appointments  Date Time Provider Department Center  06/16/2021  2:30 PM [facility] NURSE [facility]  06/20/2021  3:10 PM Myna Hidalgo, DO [facility]  06/23/2021  9:50 AM [facility] NURSE [facility]  06/27/2021  2:50 PM Lazaro Arms, MD [facility]  06/30/2021 11:10 AM [facility] NURSE [facility]  07/04/2021  2:45 PM [facility] [place] [facility] None  07/04/2021  3:30 PM Lazaro Arms, MD [facility]  07/07/2021 10:10 AM [facility] NURSE [facility]  07/11/2021 10:50 AM Cheral Marker, CNM [facility]  07/14/2021 10:10 AM [facility]  07/18/2021 10:10 AM Cheral Marker, CNM [facility]  07/21/2021 10:10 AM [facility] N[facility]  07/25/2021  2:50 PM Lazaro Arms, MD [facility]  07/28/2021 10:10 AM [facility] N[facility]SE [facility]  08/02/2021 11:10 AM Noble Bodie, Amaryllis Dyke, MD [facility]    Orders Placed This Encounter  Procedures   Urine Culture   POC Urinalysis Dipstick OB   Amaryllis Dyke Kyliah Deanda  06/13/2021 11:04 AM

## 2021-06-15 LAB — URINE CULTURE

## 2021-06-16 ENCOUNTER — Other Ambulatory Visit: Payer: BC Managed Care – PPO

## 2021-06-20 ENCOUNTER — Other Ambulatory Visit: Payer: Self-pay

## 2021-06-20 ENCOUNTER — Other Ambulatory Visit (HOSPITAL_COMMUNITY)
Admission: RE | Admit: 2021-06-20 | Discharge: 2021-06-20 | Disposition: A | Discharge status: Home / Self Care | Payer: BC Managed Care – PPO | Source: Ambulatory Visit | Attending: Obstetrics & Gynecology | Admitting: Obstetrics & Gynecology

## 2021-06-20 ENCOUNTER — Ambulatory Visit (INDEPENDENT_AMBULATORY_CARE_PROVIDER_SITE_OTHER): Payer: BC Managed Care – PPO | Admitting: Obstetrics & Gynecology

## 2021-06-20 ENCOUNTER — Encounter: Payer: Self-pay | Admitting: Obstetrics & Gynecology

## 2021-06-20 VITALS — BP 114/69 | HR 94 | Wt 330.2 lb

## 2021-06-20 DIAGNOSIS — Z3A33 33 weeks gestation of pregnancy: Secondary | ICD-10-CM

## 2021-06-20 DIAGNOSIS — Z8619 Personal history of other infectious and parasitic diseases: Secondary | ICD-10-CM | POA: Insufficient documentation

## 2021-06-20 DIAGNOSIS — Z113 Encounter for screening for infections with a predominantly sexual mode of transmission: Secondary | ICD-10-CM | POA: Insufficient documentation

## 2021-06-20 DIAGNOSIS — Z749 Problem related to care provider dependency, unspecified: Secondary | ICD-10-CM | POA: Diagnosis not present

## 2021-06-20 DIAGNOSIS — O0993 Supervision of high risk pregnancy, unspecified, third trimester: Secondary | ICD-10-CM

## 2021-06-20 DIAGNOSIS — O24415 Gestational diabetes mellitus in pregnancy, controlled by oral hypoglycemic drugs: Secondary | ICD-10-CM

## 2021-06-20 DIAGNOSIS — Z1389 Encounter for screening for other disorder: Secondary | ICD-10-CM

## 2021-06-20 DIAGNOSIS — A749 Chlamydial infection, unspecified: Secondary | ICD-10-CM

## 2021-06-20 MED ORDER — AZITHROMYCIN 500 MG PO TABS: 1000.0000 mg | ORAL_TABLET | Freq: Every day | ORAL | 0 refills | Status: DC

## 2021-06-20 NOTE — Progress Notes (Signed)
HIGH-RISK PREGNANCY VISIT Patient name: Patricia [name] MRN [id number]  Date of birth: 29-Nov-1990 Chief Complaint:   Routine Prenatal Visit  History of Present Illness:   Patricia [name] is a 30 y.o. 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   Contractions: Irritability. Vag. Bleeding: None.  Movement: Present. denies leaking of fluid.   [facility] 02/17[facility]21 11/05/2019 02/12/2019 01/15/2019  Decreased Interest 0 1 0 0  Down, Depressed, Hopeless 2 1 0 0  PHQ - 2 Score 2 2 0 0  Altered sleeping 0 1 1 -  Tired, decreased energy 1 1 1  -  Change in appetite 1 1 1  -  Feeling bad or failure about yourself  2 1 0 -  Trouble concentrating 0 0 0 -  Moving slowly or fidgety/restless 0 0 0 -  Suicidal thoughts 0 0 0 -  PHQ-9 Score 6 6 3  -  Difficult doing work/chores - Not difficult at all - -  Some recent data might be hidden     Current Outpatient Medications  Medication Instructions   Accu-Chek Softclix Lancets lancets Use as instructed to check blood sugar 4 times daily   acetaminophen (TYLENOL) 500 mg, Oral, Every 6 hours PRN   aspirin 81 mg, Oral, Daily, Swallow whole.   azithromycin (ZITHROMAX) 1,000 mg, Oral, Daily   Blood Glucose Monitoring Suppl (ACCU-CHEK GUIDE ME) w/Device KIT 1 each, Does not apply, 4 times daily   glucose blood (ACCU-CHEK GUIDE) test strip Use as instructed to check blood sugar 4 times daily   metFORMIN (GLUCOPHAGE) 500 mg, Oral, Daily at bedtime   prenatal vitamin w/FE, FA (PRENATAL 1 + 1) 27-1 MG TABS tablet 1 tablet, Oral, Daily   sertraline (ZOLOFT) 50 mg, Oral, Daily     [name]:   Pertinent items are noted  in HPI Denies abnormal vaginal discharge w/ itching/odor/irritation, headaches, visual changes, shortness of breath, chest pain, abdominal pain, severe nausea/vomiting, or problems with urination or bowel movements unless otherwise stated above. Pertinent History Reviewed:  Reviewed past medical,surgical, social, obstetrical and family history.  Reviewed problem list, medications and allergies. Physical Assessment:   Vitals:   06/20/21 1541  BP: 114/69  Pulse: 94  Weight: (!) 330 lb 3.2 oz (149.8 kg)  Body mass index is 51.72 kg/m.           Physical Examination:   General appearance: alert, well appearing, and in no distress  Mental status: alert, oriented to person, place, and time  Skin: warm & dry   Extremities: Edema: Mild pitting, slight indentation    Cardiovascular: normal heart rate noted  Respiratory: normal respiratory effort, no distress  Abdomen: gravid, soft, non-tender  Pelvic: Cervical exam performed         Fetal Status:     Movement: Present    Fetal Surveillance Testing today: NST reactive   Chaperone: Angel Neas    [facility]:  Baseline: 130 bpm, Variability: moderate, Accelerations: present, The accelerations are >15 bpm and more than 2 in 20 minutes, and Decelerations: Absent   reactive   Final diagnosis:  Reactive NST     No results found  for this or any previous visit (from the past 24 hour(s)).   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    Dispense:  2 tablet    Refill:  0    Labs/procedures today:  none  Treatment Plan:  continue with OB care as outlined above  Reviewed: Preterm labor symptoms and general obstetric precautions including but not limited to vaginal bleeding, contractions, leaking of fluid and fetal movement were reviewed in detail with the patient.  All questions were answered. Pt has home bp cuff. Check bp weekly, let u[place]oreaknow if >140/90.   Follow-up: Return in about 1 week (around 06/27/2021) for HROB visit- as scheduled twice weekly appt.   Future Appointments  Date Time Provider DArchbald 06/23/2021  9:50 AM [facility] NURSE [facility]  06/27/2021  2:50 PM EFlorian Buff MD [facility]  06/30/2021 11:10 AM [facility] NURSE [facility]  07/04/2021  2:45 PM [place] [place] None  07/04/2021  3:30 PM EFlorian Buff MD [facility]  07/07/2021 10:10 AM [facility] NURSE [facility]  07/11/2021 10:50 AM BRoma Schanz CNM [facility]  07/14/2021 10:10 AM [facility]UR[facility]  07/18/2021 10:10 AM BRoma Schanz CNM [facility]  07/21/2021 10:10 AM [facility]URSE [facility]  07/25/2021  2:50 PM EFlorian Buff MD [facility]  07/28/2021 10:10 AM [facility]URSE [facility]  08/02/2021 11:10 AM Eure, LMertie Clause MD [facility]    Orders Placed This Encounter  Procedures   POC Urinalysis Dipstick OB    JJanyth Pupa DO Attending OLamar FGolden Beachfor WSpaul[place]alt[place]

## 2021-06-22 LAB — CERVICOVAGINAL ANCILLARY ONLY
Chlamydia: NEGATIVE
Comment: NEGATIVE
Comment: NORMAL
Neisseria Gonorrhea: NEGATIVE

## 2021-06-23 ENCOUNTER — Other Ambulatory Visit: Payer: BC Managed Care – PPO

## 2021-06-24 ENCOUNTER — Ambulatory Visit (INDEPENDENT_AMBULATORY_CARE_PROVIDER_SITE_OTHER): Payer: BC Managed Care – PPO | Admitting: *Deleted

## 2021-06-24 ENCOUNTER — Other Ambulatory Visit: Payer: Self-pay

## 2021-06-24 VITALS — BP 121/76 | HR 82 | Wt 332.5 lb

## 2021-06-24 DIAGNOSIS — O0993 Supervision of high risk pregnancy, unspecified, third trimester: Secondary | ICD-10-CM

## 2021-06-24 DIAGNOSIS — Z3A34 34 weeks gestation of pregnancy: Secondary | ICD-10-CM

## 2021-06-24 DIAGNOSIS — O26899 Other specified pregnancy related conditions, unspecified trimester: Secondary | ICD-10-CM

## 2021-06-24 DIAGNOSIS — O24410 Gestational diabetes mellitus in pregnancy, diet controlled: Secondary | ICD-10-CM

## 2021-06-24 NOTE — Progress Notes (Signed)
Reviewed NST. Agree with RN documentation.   Vonzella Nipple, PA-C 06/24/2021 4:45 PM

## 2021-06-24 NOTE — Progress Notes (Signed)
   NURSE VISIT- NST  SUBJECTIVE:  Patricia [name] is a 30 y.o. 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  She reports active fetal movement, contractions: none, vaginal bleeding: none, membranes: intact.   OBJECTIVE:  BP 121/76   Pulse 82   Wt (!) 332 lb 8 oz (150.8 kg)   LMP 10/27/2020 Comment: tubal  BMI 52.08 kg/m   Appears well, no apparent distress  No results found for this or any previous visit (from the past 24 hour(s)).  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

## 2021-06-27 ENCOUNTER — Ambulatory Visit (INDEPENDENT_AMBULATORY_CARE_PROVIDER_SITE_OTHER): Payer: BC Managed Care – PPO | Admitting: Obstetrics & Gynecology

## 2021-06-27 ENCOUNTER — Other Ambulatory Visit: Payer: Self-pay

## 2021-06-27 ENCOUNTER — Encounter: Payer: Self-pay | Admitting: Obstetrics & Gynecology

## 2021-06-27 VITALS — BP 115/75 | HR 97 | Wt 333.0 lb

## 2021-06-27 DIAGNOSIS — O0993 Supervision of high risk pregnancy, unspecified, third trimester: Secondary | ICD-10-CM

## 2021-06-27 DIAGNOSIS — Z3A34 34 weeks gestation of pregnancy: Secondary | ICD-10-CM

## 2021-06-27 LAB — POCT URINALYSIS DIPSTICK OB
Glucose, UA: NEGATIVE
Ketones, UA: NEGATIVE
Nitrite, UA: NEGATIVE
POC,PROTEIN,UA: NEGATIVE

## 2021-06-27 NOTE — Progress Notes (Signed)
HIGH-RISK PREGNANCY VISIT Patient name: Patricia [name] MRN [id number]  Date of birth: [birth year] Chief Complaint:   High Risk Gestation (NST today; feet & ankles are swollen)  History of Present Illness:   Patricia [name] is a 30 y.o. [id number] female at [redacted]w[redacted]d with an Estimated Date of Delivery: 08/03/21 being seen today for ongoing management of a high-risk pregnancy complicated by diabetes mellitus A2DM currently on metformin 500 at bedtime .    Today she reports no complaints. Contractions: Irregular. Vag. Bleeding: None.  Movement: Present. denies leaking of fluid.   Depression screen [facility] 2/9 02/17/2021 11/05/2019 02/12/2019 01/15/2019  Decreased Interest 0 1 0 0  Down, Depressed, Hopeless 2 1 0 0  PHQ - 2 Score 2 2 0 0  Altered sleeping 0 1 1 -  Tired, decreased energy 1 1 1  -  Change in appetite 1 1 1  -  Feeling bad or failure about yourself  2 1 0 -  Trouble concentrating 0 0 0 -  Moving slowly or fidgety/restless 0 0 0 -  Suicidal thoughts 0 0 0 -  PHQ-9 Score 6 6 3  -  Difficult doing work/chores - Not difficult at all - -  Some recent data might be hidden     GAD 7 : Generalized Anxiety Score 02/17/2021  Nervous, Anxious, on Edge 1  Control/stop worrying 1  Worry too much - different things 1  Trouble relaxing 0  Restless 0  Easily annoyed or irritable 0  Afraid - awful might happen 0  Total GAD 7 Score 3     [name]:   Pertinent items are noted in HPI Denies abnormal vaginal discharge w/ itching/odor/irritation, headaches, visual changes, shortness of breath, chest pain, abdominal pain, severe nausea/vomiting, or problems with urination or bowel movements unless otherwise stated above. Pertinent History Reviewed:  Reviewed past medical,surgical, social, obstetrical and family history.  Reviewed problem list, medications and allergies. Physical Assessment:   Vitals:   06/27/21 1507  BP: 115/75  Pulse: 97  Weight: (!) 333 lb (151 kg)  Body mass index  is 52.16 kg/m.           Physical Examination:   General appearance: alert, well appearing, and in no distress  Mental status: alert, oriented to person, place, and time  Skin: warm & dry   Extremities: Edema: Mild pitting, slight indentation    Cardiovascular: normal heart rate noted  Respiratory: normal respiratory effort, no distress  Abdomen: gravid, soft, non-tender  Pelvic: Cervical exam deferred         Fetal Status:     Movement: Present    Fetal Surveillance Testing today: Reactive NST  Patricia [name] is at [redacted]w[redacted]d Estimated Date of Delivery: 08/03/21  NST being performed due to A2DM  Today the NST is Reactive  Fetal Monitoring:  Baseline: 140 bpm, Variability: Good {> 6 bpm), Accelerations: Reactive, and Decelerations: Absent   reactive  The accelerations are >15 bpm and more than 2 in 20 minutes  Final diagnosis:  Reactive NST  Ricky Ala, MD     Chaperone: N/A    Results for orders placed or performed in visit on 06/27/21 (from the past 24 hour(s))  POC Urinalysis Dipstick OB   Collection Time: 06/27/21  3:09 PM  Result Value Ref Range   Color, UA     Clarity, UA     Glucose, UA Negative Negative   Bilirubin, UA     Ketones, UA neg  Spec Grav, UA     Blood, UA trace    pH, UA     POC,PROTEIN,UA Negative Negative, Trace, Small (1+), Moderate (2+), Large (3+), 4+   Urobilinogen, UA     Nitrite, UA neg    Leukocytes, UA Trace (A) Negative   Appearance     Odor      Assessment & Plan:  High-risk pregnancy: [id number] at [redacted]w[redacted]d with an Estimated Date of Delivery: 08/03/21   1) A2DM excellent control,     Meds: No orders of the defined types were placed in this encounter.   Labs/procedures today: NST  Treatment Plan:  twice weekly surveillance IOL 39 weeks  Reviewed: Preterm labor symptoms and general obstetric precautions including but not limited to vaginal bleeding, contractions, leaking of fluid and fetal movement were reviewed in detail with  the patient.  All questions were answered. Does have home bp cuff. Office bp cuff given: not applicable. Check bp weekly, let us know if consistently >140 and/or >90.  Follow-up: No follow-ups on file.   Future Appointments  Date Time Provider Department Center  06/30/2021 11:10 AM [facility] NURSE [facility]  07/04/2021  2:45 PM [facility] [place] [facility] None  07/04/2021  3:30 PM Lazaro Arms, MD [facility]  07/07/2021 10:10 AM [facility] NURSE [facility]  07/11/2021 10:50 AM Cheral Marker, CNM [facility]  07/14/2021 10:10 AM [facility] NURSE [facility]  07/18/2021 10:10 AM Cheral Marker, CNM [facility]  07/21/2021 10:10 AM [facility] NURSE [facility]  07/25/2021  2:50 PM Lazaro Arms, MD [facility]  07/28/2021 10:10 AM [facility] NURSE [facility]  08/02/2021 11:10 AM Jrue Jarriel, Amaryllis Dyke, MD [facility]    Orders Placed This Encounter  Procedures   POC Urinalysis Dipstick OB   Amaryllis Dyke Lariza Cothron  06/27/2021 4:06 PM

## 2021-06-30 ENCOUNTER — Other Ambulatory Visit: Payer: Self-pay | Admitting: Obstetrics & Gynecology

## 2021-06-30 ENCOUNTER — Other Ambulatory Visit: Payer: BC Managed Care – PPO

## 2021-06-30 DIAGNOSIS — O24419 Gestational diabetes mellitus in pregnancy, unspecified control: Secondary | ICD-10-CM

## 2021-07-04 ENCOUNTER — Other Ambulatory Visit: Payer: BC Managed Care – PPO

## 2021-07-04 ENCOUNTER — Encounter: Payer: Self-pay | Admitting: *Deleted

## 2021-07-04 ENCOUNTER — Encounter: Payer: BC Managed Care – PPO | Admitting: Obstetrics & Gynecology

## 2021-07-04 ENCOUNTER — Other Ambulatory Visit: Payer: BC Managed Care – PPO | Admitting: Women's Health

## 2021-07-07 ENCOUNTER — Encounter: Payer: Self-pay | Admitting: Obstetrics & Gynecology

## 2021-07-07 ENCOUNTER — Ambulatory Visit (INDEPENDENT_AMBULATORY_CARE_PROVIDER_SITE_OTHER): Payer: BC Managed Care – PPO | Admitting: Obstetrics & Gynecology

## 2021-07-07 ENCOUNTER — Other Ambulatory Visit (HOSPITAL_COMMUNITY)
Admission: RE | Admit: 2021-07-07 | Discharge: 2021-07-07 | Disposition: A | Discharge status: Home / Self Care | Payer: BC Managed Care – PPO | Source: Ambulatory Visit | Attending: Obstetrics & Gynecology | Admitting: Obstetrics & Gynecology

## 2021-07-07 ENCOUNTER — Other Ambulatory Visit: Payer: BC Managed Care – PPO

## 2021-07-07 ENCOUNTER — Other Ambulatory Visit: Payer: Self-pay

## 2021-07-07 VITALS — BP 127/77 | HR 85 | Wt 334.8 lb

## 2021-07-07 DIAGNOSIS — O09893 Supervision of other high risk pregnancies, third trimester: Secondary | ICD-10-CM | POA: Insufficient documentation

## 2021-07-07 DIAGNOSIS — Z3A36 36 weeks gestation of pregnancy: Secondary | ICD-10-CM | POA: Insufficient documentation

## 2021-07-07 DIAGNOSIS — O24415 Gestational diabetes mellitus in pregnancy, controlled by oral hypoglycemic drugs: Secondary | ICD-10-CM

## 2021-07-07 NOTE — Progress Notes (Signed)
HIGH-RISK PREGNANCY VISIT Patient name: Patricia [name] MRN [id number]  Date of birth: 1991/04/28 Chief Complaint:   Routine Prenatal Visit and Non-stress Test (cultures)  History of Present Illness:   Patricia [name] is a 30 y.o. P7T0626 female at [redacted]w[redacted]d with an Estimated Date of Delivery: 08/03/21 being seen today for ongoing management of a high-risk pregnancy complicated by diabetes mellitus A2DM currently on metformin 500 at bedtime .    Today she reports no complaints. Contractions: Irregular.  .  Movement: Present. denies leaking of fluid.   Depression screen [facility] 2/9 02/17/2021 11/05/2019 02/12/2019 01/15/2019  Decreased Interest 0 1 0 0  Down, Depressed, Hopeless 2 1 0 0  PHQ - 2 Score 2 2 0 0  Altered sleeping 0 1 1 -  Tired, decreased energy 1 1 1  -  Change in appetite 1 1 1  -  Feeling bad or failure about yourself  2 1 0 -  Trouble concentrating 0 0 0 -  Moving slowly or fidgety/restless 0 0 0 -  Suicidal thoughts 0 0 0 -  PHQ-9 Score 6 6 3  -  Difficult doing work/chores - Not difficult at all - -  Some recent data might be hidden     GAD 7 : Generalized Anxiety Score 02/17/2021  Nervous, Anxious, on Edge 1  Control/stop worrying 1  Worry too much - different things 1  Trouble relaxing 0  Restless 0  Easily annoyed or irritable 0  Afraid - awful might happen 0  Total GAD 7 Score 3     [name]:   Pertinent items are noted in HPI Denies abnormal vaginal discharge w/ itching/odor/irritation, headaches, visual changes, shortness of breath, chest pain, abdominal pain, severe nausea/vomiting, or problems with urination or bowel movements unless otherwise stated above. Pertinent History Reviewed:  Reviewed past medical,surgical, social, obstetrical and family history.  Reviewed problem list, medications and allergies. Physical Assessment:   Vitals:   07/07/21 1023  BP: 127/77  Pulse: 85  Weight: (!) 334 lb 12.8 oz (151.9 kg)  Body mass index is 52.44  kg/m.           Physical Examination:   General appearance: alert, well appearing, and in no distress  Mental status: alert, oriented to person, place, and time  Skin: warm & dry   Extremities:      Cardiovascular: normal heart rate noted  Respiratory: normal respiratory effort, no distress  Abdomen: gravid, soft, non-tender  Pelvic: Cervical exam performed         Fetal Status: Fetal Heart Rate (bpm): 150 Fundal Height: 22 cm Movement: Present    Fetal Surveillance Testing today: Reactive NST  [name] is at [redacted]w[redacted]d Estimated Date of Delivery: 08/03/21  NST being performed due to A2DM  Today the NST is Reactive  Fetal Monitoring:  Baseline: 140 bpm, Variability: Good {> 6 bpm), Accelerations: Reactive, and Decelerations: Absent   reactive  The accelerations are >15 bpm and more than 2 in 20 minutes  Final diagnosis:  Reactive NST  Ricky Ala, MD     Chaperone: [redacted]w[redacted]d    No results found for this or any previous visit (from the past 24 hour(s)).  Assessment & Plan:  High-risk pregnancy: 10/03/21 at [redacted]w[redacted]d with an Estimated Date of Delivery: 08/03/21   1) A2DM on metformin 500 at bedtime good control,     Meds: No orders of the defined types were placed in this encounter.   Labs/procedures today: none  Treatment Plan:  continue twice weekly surveillance, IOL 39 weeks  Reviewed: Preterm labor symptoms and general obstetric precautions including but not limited to vaginal bleeding, contractions, leaking of fluid and fetal movement were reviewed in detail with the patient.  All questions were answered. Does have home bp cuff. Office bp cuff given: not applicable. Check bp weekly, let us know if consistently >140 and/or >90.  Follow-up: Return in about 4 days (around 07/11/2021) for EFW sonogram, BPP.   Future Appointments  Date Time Provider Department Center  07/11/2021 10:50 AM Cheral Marker, CNM [facility]  07/14/2021 10:10 AM [facility] NURSE  [facility]  07/18/2021 10:10 AM Cheral Marker, CNM [facility]  07/21/2021 10:10 AM [facility] NURSE [facility]  07/25/2021  2:50 PM Lazaro Arms, MD [facility]  07/28/2021 10:10 AM [facility] NURSE [facility]  08/02/2021 11:10 AM Niobe Dick, Amaryllis Dyke, MD [facility]    Orders Placed This Encounter  Procedures   Culture, beta strep (group b only)   Lazaro Arms  07/07/2021 11:34 AM

## 2021-07-08 LAB — CERVICOVAGINAL ANCILLARY ONLY
Chlamydia: NEGATIVE
Comment: NEGATIVE
Comment: NORMAL
Neisseria Gonorrhea: NEGATIVE

## 2021-07-10 LAB — CULTURE, BETA STREP (GROUP B ONLY): Strep Gp B Culture: POSITIVE — AB

## 2021-07-11 ENCOUNTER — Encounter: Payer: Self-pay | Admitting: Women's Health

## 2021-07-11 ENCOUNTER — Other Ambulatory Visit: Payer: Self-pay

## 2021-07-11 ENCOUNTER — Ambulatory Visit (INDEPENDENT_AMBULATORY_CARE_PROVIDER_SITE_OTHER): Payer: BC Managed Care – PPO | Admitting: Women's Health

## 2021-07-11 VITALS — BP 125/78 | HR 104 | Wt 332.0 lb

## 2021-07-11 DIAGNOSIS — A749 Chlamydial infection, unspecified: Secondary | ICD-10-CM

## 2021-07-11 DIAGNOSIS — O2343 Unspecified infection of urinary tract in pregnancy, third trimester: Secondary | ICD-10-CM

## 2021-07-11 DIAGNOSIS — Z6791 Unspecified blood type, Rh negative: Secondary | ICD-10-CM

## 2021-07-11 DIAGNOSIS — O24415 Gestational diabetes mellitus in pregnancy, controlled by oral hypoglycemic drugs: Secondary | ICD-10-CM

## 2021-07-11 DIAGNOSIS — O0993 Supervision of high risk pregnancy, unspecified, third trimester: Secondary | ICD-10-CM

## 2021-07-11 DIAGNOSIS — O24419 Gestational diabetes mellitus in pregnancy, unspecified control: Secondary | ICD-10-CM

## 2021-07-11 DIAGNOSIS — R768 Other specified abnormal immunological findings in serum: Secondary | ICD-10-CM

## 2021-07-11 DIAGNOSIS — Z029 Encounter for administrative examinations, unspecified: Secondary | ICD-10-CM

## 2021-07-11 DIAGNOSIS — O26899 Other specified pregnancy related conditions, unspecified trimester: Secondary | ICD-10-CM

## 2021-07-11 MED ORDER — ACYCLOVIR 400 MG PO TABS: 400.0000 mg | ORAL_TABLET | Freq: Three times a day (TID) | ORAL | 3 refills | Status: DC

## 2021-07-11 MED ORDER — METFORMIN HCL 1000 MG PO TABS: 1000.0000 mg | ORAL_TABLET | Freq: Every day | ORAL | 1 refills | Status: DC

## 2021-07-11 NOTE — Patient Instructions (Signed)
Patricia [name], thank you for choosing our office today! We appreciate the opportunity to meet your healthcare needs. You may receive a short survey by mail, e-mail, or through [employer]. If you are happy with your care we would appreciate if you could take just a few minutes to complete the survey questions. We read all of your comments and take your feedback very seriously. Thank you again for choosing our office.  Center for [employer] Team at [facility]  [facility] at [facility] ([address] [place], [place] [address]) Entrance C, located off of [address] Free 24/7 valet parking   CLASSES: Go to [email or web address] to register for classes (childbirth, breastfeeding, waterbirth, infant CPR, daddy bootcamp, etc.)  Call the office [phone]) or go to [facility] if: You begin to have strong, frequent contractions Your water breaks.  Sometimes it is a big gush of fluid, sometimes it is just a trickle that keeps getting your panties wet or running down your legs You have vaginal bleeding.  It is normal to have a small amount of spotting if your cervix was checked.  You don't feel your baby moving like normal.  If you don't, get you something to eat and drink and lay down and focus on feeling your baby move.   If your baby is still not moving like normal, you should call the office or go to [facility].  Call the office [phone]) or go to [facility] hospital for these signs of pre-eclampsia: Severe headache that does not go away with Tylenol Visual changes- seeing spots, double, blurred vision Pain under your right breast or upper abdomen that does not go away with Tums or heartburn medicine Nausea and/or vomiting Severe swelling in your hands, feet, and face   [facility] Pediatricians/Family Doctors [facility]): [address] [place], [phone]           [facility]: [address], [phone]                 [facility] Family Medicine [facility]): [address], [phone] (call to ask if accepting patients) [facility] Department: [address], [place], [phone]    [facility] Pediatricians/Family Doctors [facility]): [address], [phone] [facility]: [address] [place], [phone] [facility]: [address], [phone]  [facility] Doctors  [facility]): [phone] [facility]: [address], [phone]   [facility] Doctors [facility]: [address] [phone]  [facility] Doctors  [facility]: [phone], [phone]  Home Blood Pressure Monitoring for Patients   Your provider has recommended that you check your blood pressure (BP) at least once a week at home. If you do not have a blood pressure cuff at home, one will be provided for you. Contact your provider if you have not received your monitor within 1 week.   Helpful Tips for Accurate Home Blood Pressure Checks  Don't smoke, exercise, or drink caffeine 30 minutes before checking your BP Use the restroom before checking your BP (a full bladder can raise your pressure) Relax in a comfortable upright chair Feet on the ground Left arm resting comfortably on a flat surface at the level of your heart Legs uncrossed Back supported Sit quietly and don't talk Place the cuff on your bare arm Adjust snuggly, so that only two fingertips  can fit between your skin and the top of the cuff Check 2 readings separated by at least one minute Keep a log of your BP readings For a visual, please reference this diagram: http://ccnc.care/bpdiagram  Provider Name: [facility]     Phone: [phone]  Zone 1: ALL CLEAR  Continue to monitor your symptoms:  BP reading is less than 140 (top number) or less than 90 (bottom number)  No right  upper stomach pain No headaches or seeing spots No feeling nauseated or throwing up No swelling in face and hands  Zone 2: CAUTION Call your doctor's office for any of the following:  BP reading is greater than 140 (top number) or greater than 90 (bottom number)  Stomach pain under your ribs in the middle or right side Headaches or seeing spots Feeling nauseated or throwing up Swelling in face and hands  Zone 3: EMERGENCY  Seek immediate medical care if you have any of the following:  BP reading is greater than160 (top number) or greater than 110 (bottom number) Severe headaches not improving with Tylenol Serious difficulty catching your breath Any worsening symptoms from Zone 2   Braxton Hicks Contractions Contractions of the uterus can occur throughout pregnancy, but they are not always a sign that you are in labor. You may have practice contractions called Braxton Hicks contractions. These false labor contractions are sometimes confused with true labor. What are [place] contractions? Braxton Hicks contractions are tightening movements that occur in the muscles of the uterus before labor. Unlike true labor contractions, these contractions do not result in opening (dilation) and thinning of the cervix. Toward the end of pregnancy (32-34 weeks), Braxton Hicks contractions can happen more often and may become stronger. These contractions are sometimes difficult to tell apart from true labor because they can be very uncomfortable. You should not feel embarrassed if you go to the hospital with false labor. Sometimes, the only way to tell if you are in true labor is for your health care provider to look for changes in the cervix. The health care provider will do a physical exam and may monitor your contractions. If you are not in true labor, the exam should show that your cervix is not dilating and your water has not broken. If there are no other health problems associated with your  pregnancy, it is completely safe for you to be sent home with false labor. You may continue to have Braxton Hicks contractions until you go into true labor. How to tell the difference between true labor and false labor True labor Contractions last 30-70 seconds. Contractions become very regular. Discomfort is usually felt in the top of the uterus, and it spreads to the lower abdomen and low back. Contractions do not go away with walking. Contractions usually become more intense and increase in frequency. The cervix dilates and gets thinner. False labor Contractions are usually shorter and not as strong as true labor contractions. Contractions are usually irregular. Contractions are often felt in the front of the lower abdomen and in the groin. Contractions may go away when you walk around or change positions while lying down. Contractions get weaker and are shorter-lasting as time goes on. The cervix usually does not dilate or become thin. Follow these instructions at home:  Take over-the-counter and prescription medicines only as told by your health care provider. Keep up with your usual exercises and follow other instructions from your health care provider. Eat and drink lightly if you think  you are going into labor. If Braxton Hicks contractions are making you uncomfortable: Change your position from lying down or resting to walking, or change from walking to resting. Sit and rest in a tub of warm water. Drink enough fluid to keep your urine pale yellow. Dehydration may cause these contractions. Do slow and deep breathing several times an hour. Keep all follow-up prenatal visits as told by your health care provider. This is important. Contact a health care provider if: You have a fever. You have continuous pain in your abdomen. Get help right away if: Your contractions become stronger, more regular, and closer together. You have fluid leaking or gushing from your vagina. You pass  blood-tinged mucus (bloody show). You have bleeding from your vagina. You have low back pain that you never had before. You feel your baby's head pushing down and causing pelvic pressure. Your baby is not moving inside you as much as it used to. Summary Contractions that occur before labor are called Braxton Hicks contractions, false labor, or practice contractions. Braxton Hicks contractions are usually shorter, weaker, farther apart, and less regular than true labor contractions. True labor contractions usually become progressively stronger and regular, and they become more frequent. Manage discomfort from Braxton Hicks contractions by changing position, resting in a warm bath, drinking plenty of water, or practicing deep breathing. This information is not intended to replace advice given to you by your health care provider. Make sure you discuss any questions you have with your health care provider. Document Revised: 10/26/2017 Document Reviewed: 03/29/2017 Elsevier Patient Education  2020 Elsevier Inc.   

## 2021-07-11 NOTE — Progress Notes (Signed)
HIGH-RISK PREGNANCY VISIT Patient name: Patricia [name] MRN [id number]  Date of birth: [birth year] Chief Complaint:   Routine Prenatal Visit (NST)  History of Present Illness:   Patricia [name] is a 30 y.o. 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    Today she reports  FBS 98-101, all 2hr pp <120 . Contractions: Irregular.  .  Movement: Present. denies leaking of fluid.   Depression screen [facility] 2/9 02/17/2021 11/05/2019 02/12/2019 01/15/2019  Decreased Interest 0 1 0 0  Down, Depressed, Hopeless 2 1 0 0  PHQ - 2 Score 2 2 0 0  Altered sleeping 0 1 1 -  Tired, decreased energy 1 1 1  -  Change in appetite 1 1 1  -  Feeling bad or failure about yourself  2 1 0 -  Trouble concentrating 0 0 0 -  Moving slowly or fidgety/restless 0 0 0 -  Suicidal thoughts 0 0 0 -  PHQ-9 Score 6 6 3  -  Difficult doing work/chores - Not difficult at all - -  Some recent data might be hidden     GAD 7 : Generalized Anxiety Score 02/17/2021  Nervous, Anxious, on Edge 1  Control/stop worrying 1  Worry too much - different things 1  Trouble relaxing 0  Restless 0  Easily annoyed or irritable 0  Afraid - awful might happen 0  Total GAD 7 Score 3     [name]:   Pertinent items are noted in HPI Denies abnormal vaginal discharge w/ itching/odor/irritation, headaches, visual changes, shortness of breath, chest pain, abdominal pain, severe nausea/vomiting, or problems with urination or bowel movements unless otherwise stated above. Pertinent History Reviewed:  Reviewed past medical,surgical, social, obstetrical and family history.  Reviewed problem list, medications and allergies. Physical Assessment:   Vitals:   07/11/21 1117  BP: 125/78  Pulse: (!) 104  Weight: (!) 332 lb (150.6 kg)  Body mass index is 52 kg/m.            Physical Examination:   General appearance: alert, well appearing, and in no distress  Mental status: alert, oriented to person, place, and time  Skin: warm & dry   Extremities: Edema: Mild pitting, slight indentation    Cardiovascular: normal heart rate noted  Respiratory: normal respiratory effort, no distress  Abdomen: gravid, soft, non-tender  Pelvic: Cervical exam deferred         Fetal Status: Fetal Heart Rate (bpm): 145   Movement: Present    Fetal Surveillance Testing today: NST: FHR baseline 145 bpm, Variability: moderate, Accelerations:present, Decelerations:  Absent= Cat 1/reactive Toco: UI    Chaperone: N/A    No results found for this or any previous visit (from the past 24 hour(s)).  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    Dispense:  90 tablet    Refill:  3    Order Specific Question:   Supervising Provider    Answer:   07/13/21,  LUTHER H [[id number]]   metFORMIN (GLUCOPHAGE) 1000 MG tablet    Sig: Take 1 tablet (1,000 mg total) by mouth at bedtime.    Dispense:  30 tablet    Refill:  1    Order Specific Question:   Supervising Provider    Answer:   Lazaro Arms [[id number]]    Labs/procedures today: NST  Treatment Plan:  efw next week, 2x/wk nst, IOL @ 39wks   Reviewed: Preterm labor symptoms and general obstetric precautions including but not limited to vaginal bleeding, contractions, leaking of fluid and fetal movement were reviewed in detail with the patient.  All questions were answered.   Follow-up: Return for As scheduled.   Future Appointments  Date Time Provider Department Center  07/14/2021 10:10 AM [facility]  NURSE [facility]  07/18/2021 10:45 AM [facility] [place] [facility] None  07/18/2021 11:30 AM Cheral Marker, CNM [facility]  07/21/2021 10:10 AM [facility] NURSE [facility]  07/25/2021  2:50 PM Lazaro Arms, MD [facility]  07/28/2021 10:10 AM [facility] NURSE [facility]  08/02/2021 11:10 AM Eure, Amaryllis Dyke, MD [facility]    Orders Placed This Encounter  Procedures   US OB Follow Up   US FETAL BPP WO NON STRESS    Cheral Marker CNM, [facility] 07/11/2021 1:02 PM

## 2021-07-14 ENCOUNTER — Other Ambulatory Visit: Payer: BC Managed Care – PPO

## 2021-07-17 ENCOUNTER — Encounter (HOSPITAL_COMMUNITY): Payer: Self-pay | Admitting: Obstetrics and Gynecology

## 2021-07-17 ENCOUNTER — Other Ambulatory Visit: Payer: Self-pay

## 2021-07-17 ENCOUNTER — Inpatient Hospital Stay (HOSPITAL_COMMUNITY)
Admission: AD | Admit: 2021-07-17 | Discharge: 2021-07-17 | Disposition: A | Discharge status: Home / Self Care | Payer: BC Managed Care – PPO | Attending: Obstetrics and Gynecology | Admitting: Obstetrics and Gynecology

## 2021-07-17 DIAGNOSIS — Z3A37 37 weeks gestation of pregnancy: Secondary | ICD-10-CM | POA: Insufficient documentation

## 2021-07-17 DIAGNOSIS — O471 False labor at or after 37 completed weeks of gestation: Secondary | ICD-10-CM | POA: Insufficient documentation

## 2021-07-17 DIAGNOSIS — Z0371 Encounter for suspected problem with amniotic cavity and membrane ruled out: Secondary | ICD-10-CM | POA: Diagnosis not present

## 2021-07-17 DIAGNOSIS — R110 Nausea: Secondary | ICD-10-CM | POA: Diagnosis not present

## 2021-07-17 DIAGNOSIS — O26893 Other specified pregnancy related conditions, third trimester: Secondary | ICD-10-CM | POA: Diagnosis not present

## 2021-07-17 MED ORDER — ONDANSETRON 4 MG PO TBDP
8.0000 mg | ORAL_TABLET | Freq: Once | ORAL | Status: AC
  Administered 2021-07-17: 8 mg via ORAL
  Filled 2021-07-17: qty 2

## 2021-07-17 NOTE — MAU Provider Note (Signed)
Event Date/Time  First Provider Initiated Contact with Patient 07/17/21 0750     S: [name] is a 30 y.o. [id number] at [redacted]w[redacted]d  who presents to MAU today complaining of leaking of fluid since last night.  She denies vaginal bleeding. She denies contractions. She reports normal fetal movement.  + nausea, requesting medication.   O: BP 123/76 (BP Location: Left Arm)   Pulse 90   Temp 98.4 F (36.9 C)   Resp 18   Ht 5\' 7"  (1.702 m)   Wt (!) 151 kg   LMP 10/27/2020 Comment: tubal  SpO2 97%   BMI 52.16 kg/m  GENERAL: Well-developed, well-nourished female in no acute distress.  HEAD: Normocephalic, atraumatic.  CHEST: Normal effort of breathing, regular heart rate ABDOMEN: Soft, nontender, gravid PELVIC: Normal external female genitalia. Vagina is pink and rugated. Cervix with normal contour, no lesions. Normal discharge.  negative pooling. No discharge in the vagina.   Cervical exam:  Dilation: 1 Effacement (%): 70 Exam by:: [email or web address] NP   Fetal Monitoring: Baseline: 135 bpm Variability: moderate  Accelerations: 15x15 Decelerations: None Contractions: 1 contraction noted   No results found for this or any previous visit (from the past 24 hour(s)).   A: SIUP at [redacted]w[redacted]d  Membranes intact  P:  Discharge home in stable condition Return to MAU if symptoms  Labor precautions. Keep your OB office visit.   [redacted]w[redacted]d, NP 07/17/2021 7:57 AM

## 2021-07-17 NOTE — MAU Note (Signed)
Arrived via EMS.  At 3 am started having contractions, they are 8-10 min apart now.  Vomited when having ctxs.  When has a Ctx, either has a bm or vomits or urinates on self, denies viral, says this happens due to contractions and its painful. Unsure leaking fluid. No bleeding. Baby moving well. Was 1 cm in office on Monday.

## 2021-07-18 ENCOUNTER — Other Ambulatory Visit: Payer: BC Managed Care – PPO | Admitting: Women's Health

## 2021-07-18 ENCOUNTER — Encounter: Payer: BC Managed Care – PPO | Admitting: Women's Health

## 2021-07-18 ENCOUNTER — Other Ambulatory Visit: Payer: BC Managed Care – PPO

## 2021-07-21 ENCOUNTER — Other Ambulatory Visit: Payer: BC Managed Care – PPO

## 2021-07-21 ENCOUNTER — Other Ambulatory Visit: Payer: BC Managed Care – PPO | Admitting: Obstetrics & Gynecology

## 2021-07-25 ENCOUNTER — Encounter: Payer: Self-pay | Admitting: Obstetrics & Gynecology

## 2021-07-25 ENCOUNTER — Ambulatory Visit (INDEPENDENT_AMBULATORY_CARE_PROVIDER_SITE_OTHER): Payer: BC Managed Care – PPO | Admitting: Obstetrics & Gynecology

## 2021-07-25 ENCOUNTER — Other Ambulatory Visit: Payer: Self-pay | Admitting: Family Medicine

## 2021-07-25 ENCOUNTER — Other Ambulatory Visit: Payer: Self-pay

## 2021-07-25 VITALS — BP 129/83 | HR 91 | Wt 328.6 lb

## 2021-07-25 DIAGNOSIS — Z3A38 38 weeks gestation of pregnancy: Secondary | ICD-10-CM

## 2021-07-25 DIAGNOSIS — O24319 Unspecified pre-existing diabetes mellitus in pregnancy, unspecified trimester: Secondary | ICD-10-CM | POA: Diagnosis not present

## 2021-07-25 DIAGNOSIS — O0993 Supervision of high risk pregnancy, unspecified, third trimester: Secondary | ICD-10-CM

## 2021-07-25 NOTE — Progress Notes (Signed)
HIGH-RISK PREGNANCY VISIT Patient name: Patricia [name] MRN [id number]  Date of birth: 10/02/1991 Chief Complaint:   Routine Prenatal Visit (NST/ cervical check)  History of Present Illness:   Patricia [name] is a 30 y.o. [id number] female at [redacted]w[redacted]d with an Estimated Date of Delivery: 08/03/21 being seen today for ongoing management of a high-risk pregnancy complicated by Class B DM, on metformin 1000 mg qhs.    Today she reports no complaints. Contractions: Irregular.  .  Movement: Present. denies leaking of fluid.   Depression screen [facility] 2/9 02/17/2021 11/05/2019 02/12/2019 01/15/2019  Decreased Interest 0 1 0 0  Down, Depressed, Hopeless 2 1 0 0  PHQ - 2 Score 2 2 0 0  Altered sleeping 0 1 1 -  Tired, decreased energy 1 1 1  -  Change in appetite 1 1 1  -  Feeling bad or failure about yourself  2 1 0 -  Trouble concentrating 0 0 0 -  Moving slowly or fidgety/restless 0 0 0 -  Suicidal thoughts 0 0 0 -  PHQ-9 Score 6 6 3  -  Difficult doing work/chores - Not difficult at all - -  Some recent data might be hidden     GAD 7 : Generalized Anxiety Score 02/17/2021  Nervous, Anxious, on Edge 1  Control/stop worrying 1  Worry too much - different things 1  Trouble relaxing 0  Restless 0  Easily annoyed or irritable 0  Afraid - awful might happen 0  Total GAD 7 Score 3     [name]:   Pertinent items are noted in HPI Denies abnormal vaginal discharge w/ itching/odor/irritation, headaches, visual changes, shortness of breath, chest pain, abdominal pain, severe nausea/vomiting, or problems with urination or bowel movements unless otherwise stated above. Pertinent History Reviewed:  Reviewed past medical,surgical, social, obstetrical and family history.  Reviewed problem list, medications and allergies. Physical Assessment:   Vitals:   07/25/21 1507  BP: 129/83  Pulse: 91  Weight: (!) 328 lb 9.6 oz (149.1 kg)  Body mass index is 51.47 kg/m.           Physical Examination:    General appearance: alert, well appearing, and in no distress  Mental status: alert, oriented to person, place, and time  Skin: warm & dry   Extremities: Edema: Mild pitting, slight indentation    Cardiovascular: normal heart rate noted  Respiratory: normal respiratory effort, no distress  Abdomen: gravid, soft, non-tender  Pelvic: Cervical exam deferred         Fetal Status:     Movement: Present    Fetal Surveillance Testing today: Reactive NST  ROSIO [name] is at [redacted]w[redacted]d Estimated Date of Delivery: 08/03/21  NST being performed due to Class B DM  Today the NST is Reactive  Fetal Monitoring:  Baseline: 120 bpm, Variability: Good {> 6 bpm), Accelerations: Reactive, and Decelerations: Absent   reactive  The accelerations are >15 bpm and more than 2 in 20 minutes  Final diagnosis:  Reactive NST  Ricky Ala, MD     Chaperone: N/A    No results found for this or any previous visit (from the past 24 hour(s)).  Assessment & Plan:  High-risk pregnancy: [redacted]w[redacted]d at [redacted]w[redacted]d with an Estimated Date of Delivery: 08/03/21   1) Class B DM, stable good control on metformin 1000 qhs    Meds: No orders of the defined types were placed in this encounter.   Labs/procedures today: NST  Treatment Plan:  IOL 2 days  Reviewed: Term labor symptoms and general obstetric precautions including but not limited to vaginal bleeding, contractions, leaking of fluid and fetal movement were reviewed in detail with the patient.  All questions were answered. Does have home bp cuff. Office bp cuff given: not applicable. Check bp daily, let us know if consistently >140 and/or >90.  Follow-up: No follow-ups on file.   Future Appointments  Date Time Provider Department Center  07/27/2021  6:30 AM [facility] SCHED ROOM [facility] None  07/28/2021 10:10 AM [facility] NURSE [facility]  08/02/2021 11:10 AM Lazaro Arms, MD [facility]    No orders of the defined types were placed in this  encounter.  Lazaro Arms  07/25/2021 3:51 PM

## 2021-07-25 NOTE — Treatment Plan (Signed)
   Induction Assessment Scheduling Form: Fax to [facility]:  [phone]  Patricia [name]                                                                                   DOB:  [birth year]                                                            MRN:  [id number]                                                                     Phone #:                            Provider:  [facility]  GP:  [id number]                                                            Estimated Date of Delivery: 08/03/21  Dating Criteria: 1sttrimester sonogram    Medical Indications for induction:  Class B DM Admission Date/Time:  07/27/21 AM Gestational age on admission:  [redacted]w[redacted]d   Filed Weights   07/25/21 1507  Weight: (!) 328 lb 9.6 oz (149.1 kg)   HIV:  Non Reactive (06/16 1149) GBS: Positive/-- (08/11 1445)  FT/50/-3/vtx/soft/midplane   Method of induction(proposed):  cytotec   Scheduling Provider Signature:  Lazaro Arms, MD                                            Today's Date:  07/25/2021

## 2021-07-26 ENCOUNTER — Other Ambulatory Visit: Payer: Self-pay | Admitting: Advanced Practice Midwife

## 2021-07-27 ENCOUNTER — Inpatient Hospital Stay (HOSPITAL_COMMUNITY)
Admission: AD | Admit: 2021-07-27 | Discharge: 2021-07-29 | DRG: 806 | Disposition: A | Discharge status: Home / Self Care | Payer: BC Managed Care – PPO | Attending: Obstetrics & Gynecology | Admitting: Obstetrics & Gynecology

## 2021-07-27 ENCOUNTER — Inpatient Hospital Stay (HOSPITAL_COMMUNITY): Payer: BC Managed Care – PPO

## 2021-07-27 ENCOUNTER — Other Ambulatory Visit: Payer: Self-pay

## 2021-07-27 ENCOUNTER — Encounter (HOSPITAL_COMMUNITY): Payer: Self-pay | Admitting: Obstetrics & Gynecology

## 2021-07-27 DIAGNOSIS — O0993 Supervision of high risk pregnancy, unspecified, third trimester: Secondary | ICD-10-CM

## 2021-07-27 DIAGNOSIS — Z6791 Unspecified blood type, Rh negative: Secondary | ICD-10-CM | POA: Diagnosis not present

## 2021-07-27 DIAGNOSIS — F419 Anxiety disorder, unspecified: Secondary | ICD-10-CM | POA: Diagnosis not present

## 2021-07-27 DIAGNOSIS — B951 Streptococcus, group B, as the cause of diseases classified elsewhere: Secondary | ICD-10-CM | POA: Diagnosis present

## 2021-07-27 DIAGNOSIS — O99344 Other mental disorders complicating childbirth: Secondary | ICD-10-CM | POA: Diagnosis not present

## 2021-07-27 DIAGNOSIS — Z7982 Long term (current) use of aspirin: Secondary | ICD-10-CM

## 2021-07-27 DIAGNOSIS — E669 Obesity, unspecified: Secondary | ICD-10-CM | POA: Diagnosis present

## 2021-07-27 DIAGNOSIS — Z3A39 39 weeks gestation of pregnancy: Secondary | ICD-10-CM

## 2021-07-27 DIAGNOSIS — O99214 Obesity complicating childbirth: Secondary | ICD-10-CM | POA: Diagnosis not present

## 2021-07-27 DIAGNOSIS — O9832 Other infections with a predominantly sexual mode of transmission complicating childbirth: Secondary | ICD-10-CM | POA: Diagnosis not present

## 2021-07-27 DIAGNOSIS — Z8659 Personal history of other mental and behavioral disorders: Secondary | ICD-10-CM

## 2021-07-27 DIAGNOSIS — Z20822 Contact with and (suspected) exposure to covid-19: Secondary | ICD-10-CM | POA: Diagnosis not present

## 2021-07-27 DIAGNOSIS — A6000 Herpesviral infection of urogenital system, unspecified: Secondary | ICD-10-CM | POA: Diagnosis not present

## 2021-07-27 DIAGNOSIS — O26893 Other specified pregnancy related conditions, third trimester: Secondary | ICD-10-CM | POA: Diagnosis present

## 2021-07-27 DIAGNOSIS — O24425 Gestational diabetes mellitus in childbirth, controlled by oral hypoglycemic drugs: Principal | ICD-10-CM | POA: Diagnosis present

## 2021-07-27 DIAGNOSIS — O99824 Streptococcus B carrier state complicating childbirth: Secondary | ICD-10-CM | POA: Diagnosis present

## 2021-07-27 DIAGNOSIS — O24319 Unspecified pre-existing diabetes mellitus in pregnancy, unspecified trimester: Secondary | ICD-10-CM | POA: Diagnosis present

## 2021-07-27 DIAGNOSIS — O24313 Unspecified pre-existing diabetes mellitus in pregnancy, third trimester: Secondary | ICD-10-CM | POA: Diagnosis not present

## 2021-07-27 DIAGNOSIS — O26899 Other specified pregnancy related conditions, unspecified trimester: Secondary | ICD-10-CM

## 2021-07-27 DIAGNOSIS — Z9851 Tubal ligation status: Secondary | ICD-10-CM

## 2021-07-27 DIAGNOSIS — Z8759 Personal history of other complications of pregnancy, childbirth and the puerperium: Secondary | ICD-10-CM

## 2021-07-27 DIAGNOSIS — Z8751 Personal history of pre-term labor: Secondary | ICD-10-CM

## 2021-07-27 DIAGNOSIS — R768 Other specified abnormal immunological findings in serum: Secondary | ICD-10-CM | POA: Diagnosis present

## 2021-07-27 LAB — CBC
HCT: 31.2 % — ABNORMAL LOW (ref 36.0–46.0)
Hemoglobin: 10.3 g/dL — ABNORMAL LOW (ref 12.0–15.0)
MCH: 27.4 pg (ref 26.0–34.0)
MCHC: 33 g/dL (ref 30.0–36.0)
MCV: 83 fL (ref 80.0–100.0)
Platelets: 181 10*3/uL (ref 150–400)
RBC: 3.76 MIL/uL — ABNORMAL LOW (ref 3.87–5.11)
RDW: 13.5 % (ref 11.5–15.5)
WBC: 5.9 10*3/uL (ref 4.0–10.5)
nRBC: 0 % (ref 0.0–0.2)

## 2021-07-27 LAB — GLUCOSE, CAPILLARY
Glucose-Capillary: 93 mg/dL (ref 70–99)
Glucose-Capillary: 79 mg/dL (ref 70–99)
Glucose-Capillary: 69 mg/dL — ABNORMAL LOW (ref 70–99)

## 2021-07-27 LAB — RESP PANEL BY RT-PCR (FLU A&B, COVID) ARPGX2
Influenza A by PCR: NEGATIVE
Influenza B by PCR: NEGATIVE
SARS Coronavirus 2 by RT PCR: NEGATIVE

## 2021-07-27 LAB — TYPE AND SCREEN
ABO/RH(D): A NEG
Antibody Screen: NEGATIVE

## 2021-07-27 MED ORDER — OXYTOCIN BOLUS FROM INFUSION
333.0000 mL | Freq: Once | INTRAVENOUS | Status: AC
  Administered 2021-07-27: 333 mL via INTRAVENOUS

## 2021-07-27 MED ORDER — MEASLES, MUMPS & RUBELLA VAC IJ SOLR: 0.5000 mL | Freq: Once | INTRAMUSCULAR | Status: DC

## 2021-07-27 MED ORDER — ONDANSETRON HCL 4 MG PO TABS: 4.0000 mg | ORAL_TABLET | ORAL | Status: DC | PRN

## 2021-07-27 MED ORDER — LACTATED RINGERS IV SOLN: INTRAVENOUS | Status: DC

## 2021-07-27 MED ORDER — TETANUS-DIPHTH-ACELL PERTUSSIS 5-2.5-18.5 LF-MCG/0.5 IM SUSY: 0.5000 mL | PREFILLED_SYRINGE | Freq: Once | INTRAMUSCULAR | Status: DC

## 2021-07-27 MED ORDER — OXYTOCIN-SODIUM CHLORIDE 30-0.9 UT/500ML-% IV SOLN
2.5000 [IU]/h | INTRAVENOUS | Status: DC
  Administered 2021-07-27: 2.5 [IU]/h via INTRAVENOUS

## 2021-07-27 MED ORDER — DIPHENHYDRAMINE HCL 25 MG PO CAPS: 25.0000 mg | ORAL_CAPSULE | Freq: Four times a day (QID) | ORAL | Status: DC | PRN

## 2021-07-27 MED ORDER — SENNOSIDES-DOCUSATE SODIUM 8.6-50 MG PO TABS
2.0000 | ORAL_TABLET | ORAL | Status: DC
Start: 2021-07-28 — End: 2021-07-30
  Administered 2021-07-28: 2 via ORAL
  Filled 2021-07-27 (×2): qty 2

## 2021-07-27 MED ORDER — IBUPROFEN 600 MG PO TABS
600.0000 mg | ORAL_TABLET | Freq: Four times a day (QID) | ORAL | Status: DC
Start: 2021-07-28 — End: 2021-07-30
  Administered 2021-07-27 – 2021-07-29 (×7): 600 mg via ORAL
  Filled 2021-07-27 (×7): qty 1

## 2021-07-27 MED ORDER — ACETAMINOPHEN 325 MG PO TABS: 650.0000 mg | ORAL_TABLET | ORAL | Status: DC | PRN

## 2021-07-27 MED ORDER — LIDOCAINE HCL (PF) 1 % IJ SOLN: 30.0000 mL | INTRAMUSCULAR | Status: DC | PRN

## 2021-07-27 MED ORDER — PRENATAL MULTIVITAMIN CH
1.0000 | ORAL_TABLET | Freq: Every day | ORAL | Status: DC
  Administered 2021-07-28 – 2021-07-29 (×2): 1 via ORAL
  Filled 2021-07-27 (×2): qty 1

## 2021-07-27 MED ORDER — OXYCODONE-ACETAMINOPHEN 5-325 MG PO TABS: 1.0000 | ORAL_TABLET | ORAL | Status: DC | PRN

## 2021-07-27 MED ORDER — LACTATED RINGERS IV SOLN: 500.0000 mL | INTRAVENOUS | Status: DC | PRN

## 2021-07-27 MED ORDER — SERTRALINE HCL 50 MG PO TABS
50.0000 mg | ORAL_TABLET | Freq: Every day | ORAL | Status: DC
  Administered 2021-07-27 – 2021-07-29 (×3): 50 mg via ORAL
  Filled 2021-07-27 (×3): qty 1

## 2021-07-27 MED ORDER — SIMETHICONE 80 MG PO CHEW: 80.0000 mg | CHEWABLE_TABLET | ORAL | Status: DC | PRN

## 2021-07-27 MED ORDER — TERBUTALINE SULFATE 1 MG/ML IJ SOLN: 0.2500 mg | Freq: Once | INTRAMUSCULAR | Status: DC | PRN

## 2021-07-27 MED ORDER — WITCH HAZEL-GLYCERIN EX PADS: 1.0000 "application " | MEDICATED_PAD | CUTANEOUS | Status: DC | PRN

## 2021-07-27 MED ORDER — DIBUCAINE (PERIANAL) 1 % EX OINT: 1.0000 "application " | TOPICAL_OINTMENT | CUTANEOUS | Status: DC | PRN

## 2021-07-27 MED ORDER — ACETAMINOPHEN 325 MG PO TABS
650.0000 mg | ORAL_TABLET | ORAL | Status: DC | PRN
  Administered 2021-07-28: 650 mg via ORAL
  Filled 2021-07-27: qty 2

## 2021-07-27 MED ORDER — ONDANSETRON HCL 4 MG/2ML IJ SOLN: 4.0000 mg | INTRAMUSCULAR | Status: DC | PRN

## 2021-07-27 MED ORDER — ACYCLOVIR 400 MG PO TABS
400.0000 mg | ORAL_TABLET | Freq: Three times a day (TID) | ORAL | Status: DC
  Filled 2021-07-27 (×2): qty 1

## 2021-07-27 MED ORDER — BENZOCAINE-MENTHOL 20-0.5 % EX AERO: 1.0000 "application " | INHALATION_SPRAY | CUTANEOUS | Status: DC | PRN

## 2021-07-27 MED ORDER — FENTANYL CITRATE (PF) 100 MCG/2ML IJ SOLN
INTRAMUSCULAR | Status: AC
  Administered 2021-07-27: 100 ug via INTRAVENOUS
  Filled 2021-07-27: qty 2

## 2021-07-27 MED ORDER — MISOPROSTOL 25 MCG QUARTER TABLET
25.0000 ug | ORAL_TABLET | ORAL | Status: DC | PRN
  Administered 2021-07-27: 25 ug via VAGINAL
  Filled 2021-07-27: qty 1

## 2021-07-27 MED ORDER — OXYCODONE HCL 5 MG PO TABS: 5.0000 mg | ORAL_TABLET | ORAL | Status: DC | PRN

## 2021-07-27 MED ORDER — ONDANSETRON HCL 4 MG/2ML IJ SOLN: 4.0000 mg | Freq: Four times a day (QID) | INTRAMUSCULAR | Status: DC | PRN

## 2021-07-27 MED ORDER — OXYCODONE-ACETAMINOPHEN 5-325 MG PO TABS: 2.0000 | ORAL_TABLET | ORAL | Status: DC | PRN

## 2021-07-27 MED ORDER — ZOLPIDEM TARTRATE 5 MG PO TABS: 5.0000 mg | ORAL_TABLET | Freq: Every evening | ORAL | Status: DC | PRN

## 2021-07-27 MED ORDER — PENICILLIN G POTASSIUM 5000000 UNITS IJ SOLR
5.0000 10*6.[IU] | Freq: Once | INTRAMUSCULAR | Status: AC
  Administered 2021-07-27: 5 10*6.[IU] via INTRAVENOUS
  Filled 2021-07-27: qty 5

## 2021-07-27 MED ORDER — FENTANYL CITRATE (PF) 100 MCG/2ML IJ SOLN: 100.0000 ug | INTRAMUSCULAR | Status: DC | PRN

## 2021-07-27 MED ORDER — COCONUT OIL OIL: 1.0000 "application " | TOPICAL_OIL | Status: DC | PRN

## 2021-07-27 MED ORDER — OXYTOCIN-SODIUM CHLORIDE 30-0.9 UT/500ML-% IV SOLN
1.0000 m[IU]/min | INTRAVENOUS | Status: DC
  Administered 2021-07-27: 2 m[IU]/min via INTRAVENOUS
  Filled 2021-07-27: qty 500

## 2021-07-27 MED ORDER — OXYTOCIN-SODIUM CHLORIDE 30-0.9 UT/500ML-% IV SOLN
1.0000 m[IU]/min | INTRAVENOUS | Status: DC
Start: 2021-07-27 — End: 2021-07-27

## 2021-07-27 MED ORDER — SOD CITRATE-CITRIC ACID 500-334 MG/5ML PO SOLN: 30.0000 mL | ORAL | Status: DC | PRN

## 2021-07-27 MED ORDER — PENICILLIN G POT IN DEXTROSE 60000 UNIT/ML IV SOLN
3.0000 10*6.[IU] | INTRAVENOUS | Status: DC
  Administered 2021-07-27 (×2): 3 10*6.[IU] via INTRAVENOUS
  Filled 2021-07-27 (×2): qty 50

## 2021-07-27 NOTE — H&P (Signed)
OBSTETRIC ADMISSION HISTORY AND PHYSICAL  Patricia [name] is a 30 y.o. female [phone] with IUP at 30w0dby LMP presenting for IOL due to Class B DM. She reports +FMs, no LOF, no VB, no blurry vision, headaches or RUQ pain. She has had some lower extremity swelling this pregnancy. She plans on bottle feeding. She requests interval bilateral salpingectomy for birth control.  [facility]   Dating: By LMP --->  Estimated Date of Delivery: 08/03/21  Sono:   @[redacted]w[redacted]d , CWD, normal anatomy, cephalic presentation, posterior placenta, 2057g, 61% EFW  Prenatal History/Complications:  Class B DM - On Metformin 10051mqhs HSV - Acyclovir 40079mID @ 34 wks GBS positive Rh negative - Received Rhogam 6/27 Hx of BTL Hx of PPROM Hx of depression/anxiety/PPD  Past Medical History: Past Medical History:  Diagnosis Date   Anemia    Anxiety    Bacterial vaginosis    Chlamydia    Depression    PP depression after 3rd pregnancy - resolved with therarpy   Migraine    Miscarriage 2019   Preterm labor    Preterm labor without delivery in second trimester June 2012   Positive FFN in June   UTI (lower urinary tract infection)     Past Surgical History: Past Surgical History:  Procedure Laterality Date   CHOLECYSTECTOMY N/A 10/22/2020   Procedure: LAPAROSCOPIC CHOLECYSTECTOMY;  Surgeon: BriVirl CageyD;  Location: [facility];  Service: General;  Laterality: N/A;   TUBAL LIGATION N/A 09/15/2019   Procedure: POST PARTUM TUBAL LIGATION;  Surgeon: EurFlorian BuffD;  Location: [facility];  Service: Gynecology;  Laterality: N/A;    Obstetrical History: OB History     Gravida  6   Para  4   Term  3   Preterm  1   AB  1   Living  4      SAB  1   IAB      Ectopic      Multiple  0   Live Births  4           Social History Social History   Socioeconomic History   Marital status: Single    Spouse name: Not on file   Number of children:  4   Years of education: Not on file   Highest education level: Not on file  Occupational History   Not on file  Tobacco Use   Smoking status: Never   Smokeless tobacco: Never  Vaping Use   Vaping Use: Never used  Substance and Sexual Activity   Alcohol use: Not Currently   Drug use: No   Sexual activity: Yes    Birth control/protection: Surgical    Comment: tubal ligation  Other Topics Concern   Not on file  Social History Narrative   Ms. Zaffino lives in [place] her two sons (4 and 2yo). Her grandmother helps take care of her children when she is at work. She was working at [employer]-time during her pregnancy and plans to return to work 2 weeks post-partum. FOB lives close by and plans to be involved in [name]'s life.   Social Determinants of Health   Financial Resource Strain: Low Risk    Difficulty of Paying Living Expenses: Not hard at all  Food Insecurity: No Food Insecurity   Worried About RunCharity fundraiser the Last Year: Never true   RanMagnet the Last Year: Never  true  Transportation Needs: No Transportation Needs   Lack of Transportation (Medical): No   Lack of Transportation (Non-Medical): No  Physical Activity: Insufficiently Active   Days of Exercise per Week: 2 days   Minutes of Exercise per Session: 60 min  Stress: No Stress Concern Present   Feeling of Stress : Not at all  Social Connections: Socially Isolated   Frequency of Communication with Friends and Family: More than three times a week   Frequency of Social Gatherings with Friends and Family: Twice a week   Attends Religious Services: Never   Marine scientist or Organizations: No   Attends Music therapist: Never   Marital Status: Never married    Family History: Family History  Problem Relation Age of Onset   Diabetes Other        great granmother   Diabetes Paternal Grandfather    Hypertension Paternal Grandfather    Cancer Paternal Grandfather         prostate   Miscarriages / Stillbirths Maternal Grandmother     Allergies: No Known Allergies  Medications Prior to Admission  Medication Sig Dispense Refill Last Dose   acyclovir (ZOVIRAX) 400 MG tablet Take 1 tablet (400 mg total) by mouth 3 (three) times daily. 90 tablet 3 07/27/2021   aspirin 81 MG EC tablet Take 1 tablet (81 mg total) by mouth daily. Swallow whole. 90 tablet 3 07/27/2021   metFORMIN (GLUCOPHAGE) 1000 MG tablet Take 1 tablet (1,000 mg total) by mouth at bedtime. 30 tablet 1 07/26/2021   prenatal vitamin w/FE, FA (PRENATAL 1 + 1) 27-1 MG TABS tablet Take 1 tablet by mouth daily at 12 noon. 30 tablet 0 07/27/2021   sertraline (ZOLOFT) 50 MG tablet Take 1 tablet (50 mg total) by mouth daily. 90 tablet 3 07/26/2021   Accu-Chek Softclix Lancets lancets Use as instructed to check blood sugar 4 times daily 100 each 12    acetaminophen (TYLENOL) 500 MG tablet Take 500 mg by mouth every 6 (six) hours as needed.   More than a month   Blood Glucose Monitoring Suppl (ACCU-CHEK GUIDE ME) w/Device KIT 1 each by Does not apply route 4 (four) times daily. 1 kit 0    glucose blood (ACCU-CHEK GUIDE) test strip Use as instructed to check blood sugar 4 times daily 50 each 12      [name]  All systems reviewed and negative except as stated in HPI  Blood pressure 119/86, pulse (!) 108, temperature 97.9 F (36.6 C), temperature source Axillary, resp. rate 20, height 5' 7"  (1.702 m), weight (!) 148.7 kg, last menstrual period 10/27/2020, SpO2 97 %, not currently breastfeeding.  General appearance: alert, cooperative, and no distress Lungs: normal work of breathing on room air  Heart: normal rate, warm and well perfused  Abdomen: soft, non-tender, gravid Pelvic: normal external genitalia without lesions, normal vaginal mucosa and cervix without lesions  Extremities: nonpitting LE edema present bilaterally Presentation: cephalic  Fetal monitoring: Baseline 150 bpm, moderate  variability, + accels, - decels Uterine activity: Irregular contractions   Dilation: 3 Effacement (%): 50 Station: -2 Exam by:: Dr. Gwenlyn Perking  Prenatal labs: ABO, Rh: --/--/A NEG (08/31 1110) Antibody: NEG (08/31 1110) Rubella: 11.00 (03/24 1032) RPR: Non Reactive (06/16 1149)  HBsAg: Negative (03/24 1032)  HIV: Non Reactive (06/16 1149)  GBS: Positive/-- (08/11 1445)  1 hr Glucola 134 Genetic screening - Low risk female  Anatomy US normal   Prenatal Transfer Tool  Maternal Diabetes: Yes:  Diabetes Type:  Pre-pregnancy - Class B DM Genetic Screening: Normal Maternal Ultrasounds/Referrals: Normal Fetal Ultrasounds or other Referrals:  Referred to Materal Fetal Medicine  Maternal Substance Abuse:  No Significant Maternal Medications:  Meds include: Zoloft Other: Metformin 1065m daily, Acyclovir 4067mTID Significant Maternal Lab Results: Group B Strep positive and Rh negative  Results for orders placed or performed during the hospital encounter of 07/27/21 (from the past 24 hour(s))  CBC   Collection Time: 07/27/21 10:19 AM  Result Value Ref Range   WBC 5.9 4.0 - 10.5 K/uL   RBC 3.76 (L) 3.87 - 5.11 MIL/uL   Hemoglobin 10.3 (L) 12.0 - 15.0 g/dL   HCT 31.2 (L) 36.0 - 46.0 %   MCV 83.0 80.0 - 100.0 fL   MCH 27.4 26.0 - 34.0 pg   MCHC 33.0 30.0 - 36.0 g/dL   RDW 13.5 11.5 - 15.5 %   Platelets 181 150 - 400 K/uL   nRBC 0.0 0.0 - 0.2 %  Type and screen   Collection Time: 07/27/21 11:10 AM  Result Value Ref Range   ABO/RH(D) A NEG    Antibody Screen NEG    Sample Expiration      07/30/2021,2359 Performed at [facility] [place] [id number] Glucose, capillary   Collection Time: 07/27/21 11:30 AM  Result Value Ref Range   Glucose-Capillary 93 70 - 99 mg/dL  Resp Panel by RT-PCR (Flu A&B, Covid) Nasopharyngeal Swab   Collection Time: 07/27/21 11:34 AM   Specimen: Nasopharyngeal Swab; Nasopharyngeal(NP) swabs in vial transport medium  Result  Value Ref Range   SARS Coronavirus 2 by RT PCR NEGATIVE NEGATIVE   Influenza A by PCR NEGATIVE NEGATIVE   Influenza B by PCR NEGATIVE NEGATIVE    Patient Active Problem List   Diagnosis Date Noted   Modified White class B pregestational diabetes mellitus 07/27/2021   UTI (urinary tract infection) during pregnancy, third trimester 05/31/2021   Gestational diabetes 03/10/2021   History of postpartum depression 02/17/2021   Supervision of high-risk pregnancy 02/17/2021   Rh negative state in antepartum period 02/17/2021   History of laparoscopic cholecystectomy 01/21/2021   S/P tubal ligation 12/02/2020   History of anxiety 10/22/2020   Morbid obesity with BMI of 50.0-59.9, adult ([place]/26/2021   History of preterm delivery 11/08/2015   Chlamydia 08/20/2015   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   Obesity 04/01/2013    Assessment/Plan:  TyJANI MORONTAs a [age].o. [id number] 3930w0dre for IOL due to Class B DM.   #Labor:SVE 3/50/-2. Vaginal Cytotec 25 mcg placed. Will reassess in 3-4 hours and consider AROM next check if favorable.  #Pain: PRN #FWB: Cat 1  #ID:  GBS positive #MOF: Bottle #MOC: Interval bilateral salpingectomy #Circ:  Yes   #Class B DM: Glucose on admission 93. EFW 61%ile - extrapolates to 3560 g. Pelvis proven to 3630 g. Will continue to check glucose Q4 hr in latent labor. Q2 hr in active labor.   #HSV2 - SSE negative. Continue Acyclovir.   #Hx depression/anxiety: Continue home Zoloft, mood stable.  ChrGenia DelD  OB Fellow  Faculty Practice 07/27/2021, 12:46 PM

## 2021-07-27 NOTE — Lactation Note (Signed)
This note was copied from a baby's chart. Lactation Consultation Note  Patient Name: Patricia [name] HMCNO'B Date: 07/27/2021 Reason for consult: Initial assessment;Mother's request Age:30 years  LC in to room per mother's request and initial visit. Mother explains newborn finished feeding ~5 mL of formula prior LC visit. Per RN, due to low blood sugar.  Discussed normal newborn behavior and patterns, signs of good milk transfer, hunger cues, tummy size and benefits of skin to skin.  Mother is eager to try breastfeeding with this baby. Mother has inverted nipples, hx of difficulty latching and low milk supply with other children.   Plan: 1-Feeding on demand or 8-12 times in 24h period. 2-Encouraged maternal rest, hydration and food intake.  3-Contact LC as needed for feeds/support/concerns/questions   All questions answered at this time. Provided Lactation services brochure and promoted INJoy booklet information.     Maternal Data Has patient been taught Hand Expression?: No Does the patient have breastfeeding experience prior to this delivery?: Yes How long did the patient breastfeed?: <2 weeks  Feeding Mother's Current Feeding Choice: Breast Milk and Formula Nipple Type: Slow - flow  Interventions Interventions: Breast feeding basics reviewed;Skin to skin;Expressed milk;Education;Pace feeding  Discharge WIC Program: Yes  Consult Status Consult Status: Follow-up Date: 07/28/21 Follow-up type: In-patient    Jaymie Misch A Higuera Ancidey 07/27/2021, 11:37 PM

## 2021-07-27 NOTE — Progress Notes (Signed)
Labor Progress Note Patricia [name] is a 30 y.o. [id number] at [redacted]w[redacted]d presented for IOL for Class B DM.  S: Doing well. Breathing through contractions that are more painful. No concerns at this time.   O:  BP (!) 86/46   Pulse 74   Temp 99 F (37.2 C) (Oral)   Resp 18   Ht 5\' 7"  (1.702 m)   Wt (!) 148.7 kg   LMP 10/27/2020 Comment: tubal  SpO2 97%   BMI 51.36 kg/m   CVE: Dilation: 6 Effacement (%): 90 Cervical Position: Middle Station: -2 Presentation: Vertex Exam by:: Dr. [email or web address]   A&P: 30 y.o. 26 [redacted]w[redacted]d.  #Labor: Progressing well. AROM performed this check with clear fluid. Pitocin a 4 milli-units/min. Continue current management. Anticipate SVD shortly.  #Pain: PRN; would like to avoid epidural #FWB: Cat 1  #GBS pos > PCN  [redacted]w[redacted]d, MD 8:27 PM

## 2021-07-27 NOTE — Discharge Summary (Signed)
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 yo [id number] 39w0das admitted to the hospital 07/27/2021 for induction of labor.  Indication for induction: Class B/A2 DM.  Patient had an uncomplicated labor course as follows: Membrane Rupture Time/Date: 7:46 PM ,07/27/2021   Delivery Method:Vaginal, Spontaneous  Episiotomy: None  Lacerations:  None  Details of delivery can be found in separate delivery note.  Patient had a routine postpartum course. Her fasting CBG on PPD#1 was 99 Patient is discharged home 07/29/21.  Newborn Data: Birth date:07/27/2021  Birth time:8:35 PM  Gender:Female  Living status:Living  Apgars:8 ,8  Weight:3215 g (7lb 1.4oz)  Magnesium Sulfate received: No BMZ received: No Rhophylac:Yes MMR:N/A T-DaP:Given prenatally Flu: N/A Transfusion:No  Physical exam  Vitals:   07/28/21 0730 07/28/21 1458 07/28/21 2319 07/29/21 0542  BP: 127/76 109/64 131/89 111/61  Pulse: 71 72 60 (!) 55  Resp: 18 17 18 18   Temp: 98 F (36.7 C) 98.4 F (36.9 C) 97.9 F (36.6 C) 98.2 F  (36.8 C)  TempSrc: Oral Oral Oral Oral  SpO2: 99% 98%    Weight:      Height:       General: alert, cooperative, and no distress Lochia: appropriate Uterine Fundus: firm Incision: N/A DVT Evaluation: No evidence of DVT seen on physical exam. Negative Homan's sign. No cords or calf tenderness. Labs: Lab Results  Component Value Date   WBC 5.9 07/27/2021   HGB 10.3 (L) 07/27/2021   HCT 31.2 (L) 07/27/2021   MCV 83.0 07/27/2021   PLT 181 07/27/2021   CMP Latest Ref Rng & Units 03/10/2021  Glucose 70 - 99 mg/dL 108(H)  BUN 6 - 20 mg/dL 8  Creatinine 0.44 - 1.00 mg/dL 0.79  Sodium 135 - 145 mmol/L 136  Potassium 3.5 - 5.1 mmol/L 3.5  Chloride 98 - 111 mmol/L 104  CO2 22 - 32 mmol/L 24  Calcium 8.9 - 10.3 mg/dL 8.8(L)  Total Protein 6.5 - 8.1 g/dL -  Total Bilirubin 0.3 - 1.2 mg/dL -  Alkaline Phos 38 - 126 U/L -  AST 15 - 41 U/L -  ALT 0 - 44 U/L -   Edinburgh Score: Edinburgh Postnatal Depression Scale Screening Tool 07/28/2021  I have been able to laugh and see the funny side of things. 1  I have looked forward with enjoyment to things. 3  I have blamed myself unnecessarily when things went  wrong. 3  I have been anxious or worried for no good reason. 1  I have felt scared or panicky for no good reason. 2  Things have been getting on top of me. 2  I have been so unhappy that I have had difficulty sleeping. 2  I have felt sad or miserable. 1  I have been so unhappy that I have been crying. 2  The thought of harming myself has occurred to me. 0  Edinburgh Postnatal Depression Scale Total 17     After visit meds:  Allergies as of 07/29/2021   No Known Allergies      Medication List     STOP taking these medications    Accu-Chek Guide Me w/Device Kit   Accu-Chek Guide test strip Generic drug: glucose blood   Accu-Chek Softclix Lancets lancets   acetaminophen 500 MG tablet Commonly known as: TYLENOL   acyclovir 400 MG tablet Commonly known as: ZOVIRAX    aspirin 81 MG EC tablet   metFORMIN 1000 MG tablet Commonly known as: Glucophage       TAKE these medications    ibuprofen 600 MG tablet Commonly known as: ADVIL Take 1 tablet (600 mg total) by mouth every 6 (six) hours.   prenatal vitamin w/FE, FA 27-1 MG Tabs tablet Take 1 tablet by mouth daily at 12 noon.   sertraline 50 MG tablet Commonly known as: ZOLOFT Take 1 tablet (50 mg total) by mouth daily.         Discharge home in stable condition Infant Feeding: Bottle Infant Disposition:home with mother Discharge instruction: per After Visit Summary and Postpartum booklet. Activity: Advance as tolerated. Pelvic rest for 6 weeks.  Diet: carb modified diet Future Appointments:No future appointments. Follow up Visit:  Myrtis Ser, CNM  Gloris [place] Please schedule this patient for Postpartum visit in: 4 weeks with the following provider: [facility]  In-Person  For C/S patients schedule nurse incision check in weeks 2 weeks: no  High risk pregnancy complicated by: Z7/Q GDM  Delivery mode:  SVD  Anticipated Birth Control:  Plans Interval BTL  PP Procedures needed: 2hr GTT & interval salpingectomy (~8wks PP)  Schedule Integrated BH visit: no   07/29/2021 Christin Fudge, CNM

## 2021-07-27 NOTE — Lactation Note (Signed)
This note was copied from a baby's chart. Lactation Consultation Note  Patient Name: Patricia [name] ZLDJT'T Date: 07/27/2021 Reason for consult: L&D Initial assessment Age:29 hours  L&D consult with >60 minutes old infant and P5 mother. Congratulated family on newborn.  No latch or hand expression assistance at this time. Mother states she has been having challenges due to low milk supply and inverted nipples. Mother reports attempting breastfeeding only first and third child.    Discussed STS as ideal transition for infants after birth helping with temperature, blood sugar and comfort. Talked about primal reflexes such as rooting, hands to mouth, searching for the breast among others. Explained LC services availability during postpartum stay. Thanked family for their time.      Maternal Data Has patient been taught Hand Expression?: No Does the patient have breastfeeding experience prior to this delivery?: Yes How long did the patient breastfeed?: <2 weeks  Interventions Interventions: Breast feeding basics reviewed;Skin to skin;Education  Discharge WIC Program: Yes  Consult Status Consult Status: Follow-up from L&D    [name] A Higuera Ancidey 07/27/2021, 10:20 PM

## 2021-07-28 ENCOUNTER — Other Ambulatory Visit: Payer: BC Managed Care – PPO

## 2021-07-28 DIAGNOSIS — Z3A39 39 weeks gestation of pregnancy: Secondary | ICD-10-CM | POA: Diagnosis not present

## 2021-07-28 DIAGNOSIS — O24313 Unspecified pre-existing diabetes mellitus in pregnancy, third trimester: Secondary | ICD-10-CM | POA: Diagnosis not present

## 2021-07-28 DIAGNOSIS — O99824 Streptococcus B carrier state complicating childbirth: Secondary | ICD-10-CM | POA: Diagnosis not present

## 2021-07-28 LAB — KLEIHAUER-BETKE STAIN
# Vials RhIg: 1
Fetal Cells %: 0 %
Quantitation Fetal Hemoglobin: 0 mL

## 2021-07-28 LAB — GLUCOSE, CAPILLARY: Glucose-Capillary: 72 mg/dL (ref 70–99)

## 2021-07-28 LAB — RPR: RPR Ser Ql: NONREACTIVE

## 2021-07-28 MED ORDER — RHO D IMMUNE GLOBULIN 1500 UNIT/2ML IJ SOSY
300.0000 ug | PREFILLED_SYRINGE | Freq: Once | INTRAMUSCULAR | Status: AC
  Administered 2021-07-28: 300 ug via INTRAVENOUS
  Filled 2021-07-28: qty 2
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
## 2021-07-28 NOTE — Progress Notes (Addendum)
Post Partum Day #1 Subjective: no complaints, up ad lib, and tolerating PO; bottlefeeding; already has BTS scheduled in October; she desires a circumcision for her son- consented and note placed in infant's chart  Objective: Blood pressure 127/76, pulse 71, temperature 98 F (36.7 C), temperature source Oral, resp. rate 18, height 5\' 7"  (1.702 m), weight (!) 148.7 kg, last menstrual period 10/27/2020, SpO2 99 %, unknown if currently breastfeeding.  FBS: 72 this morning  Physical Exam:  General: cooperative, fatigued, and no distress Lochia: appropriate Uterine Fundus: firm DVT Evaluation: No evidence of DVT seen on physical exam.  Recent Labs    07/27/21 1019  HGB 10.3*  HCT 31.2*    Assessment/Plan: Plan for discharge tomorrow and Circumcision prior to discharge   LOS: 1 day   07/29/21 CNM 07/28/2021, 9:17 AM

## 2021-07-29 LAB — RH IG WORKUP (INCLUDES ABO/RH)
Gestational Age(Wks): 39
Unit division: 0

## 2021-07-29 LAB — GLUCOSE, CAPILLARY: Glucose-Capillary: 99 mg/dL (ref 70–99)

## 2021-07-29 MED ORDER — IBUPROFEN 600 MG PO TABS: 600.0000 mg | ORAL_TABLET | Freq: Four times a day (QID) | ORAL | 0 refills | Status: DC

## 2021-08-02 ENCOUNTER — Other Ambulatory Visit: Payer: BC Managed Care – PPO | Admitting: Obstetrics & Gynecology

## 2021-08-02 ENCOUNTER — Telehealth: Payer: Self-pay

## 2021-08-02 NOTE — Telephone Encounter (Signed)
Transition Care Management Unsuccessful Follow-up Telephone Call  Date of discharge and from where:  07/29/2021 from [facility]  Attempts:  1st Attempt  Reason for unsuccessful TCM follow-up call:  Left voice message

## 2021-08-03 ENCOUNTER — Inpatient Hospital Stay (HOSPITAL_COMMUNITY): Admit: 2021-08-03 | Payer: Self-pay

## 2021-08-04 NOTE — Telephone Encounter (Signed)
Transition Care Management Unsuccessful Follow-up Telephone Call  Date of discharge and from where:  07/29/2021 - [facility]  Attempts:  2nd Attempt  Reason for unsuccessful TCM follow-up call:  Left voice message

## 2021-08-05 NOTE — Telephone Encounter (Signed)
Transition Care Management Unsuccessful Follow-up Telephone Call  Date of discharge and from where:  07/29/2021 from [facility]  Attempts:  3rd Attempt  Reason for unsuccessful TCM follow-up call:  Unable to reach patient

## 2021-08-09 ENCOUNTER — Telehealth: Payer: Self-pay | Admitting: Obstetrics & Gynecology

## 2021-08-09 NOTE — Telephone Encounter (Signed)
Called pt to relay sickle cell lab result confirmation to pt. Two identifiers used. Pt confirmed understanding.

## 2021-08-09 NOTE — Telephone Encounter (Signed)
Patient called stating that the Sickle Cell Trait agency in [place] called to tell her that her newborn has the s-trait and she wants to know if FT did the hemoglobin test on her. She wants to see if she passed it down to her newborn.

## 2021-08-10 ENCOUNTER — Telehealth (HOSPITAL_COMMUNITY): Payer: Self-pay

## 2021-08-10 DIAGNOSIS — Z1331 Encounter for screening for depression: Secondary | ICD-10-CM

## 2021-08-10 NOTE — Telephone Encounter (Signed)
"  I'm doing good." Patient declines questions or concerns about her healing.  "He's doing good. He is eating and gaining weight. He sleeps in a crib." RN reviewed ABC's of safe sleep with patient. Patient declines any questions or concerns about baby.  EPDS score is 13. Referral placed for integrated behavioral health. Informed Dr. Macon Large about referral.   RN told patient about Maternal Mental Health Resources ([facility],National Maternal Mental Health Hotline, Postpartum [employer]). Also told patient about [facility] postpartum support groups. Will e-mail patient these resources as well.   Marcelino Duster [facility] 08/10/2021,1827

## 2021-08-16 NOTE — BH Specialist Note (Signed)
Integrated Behavioral Health Initial In-Person Visit  MRN: [id number] Name: Patricia [name]  Number of Integrated Behavioral Health Clinician visits:: 1/6 Session Start time: 8:19  Session End time: 9:20 Total time:  61  minutes  Types of Service: Individual psychotherapy  Interpretor:No. Interpretor Name and Language: n/a   Warm Hand Off Completed.        Subjective: Patricia [name] is a 30 y.o. female accompanied by  n/a Patient was referred by [name], CNM for positive depression screen. Patient reports the following symptoms/concerns: Depression, stress, worry, attributed to breakup with FOB one week prior to childbirth; staying busy with work helps to manage emotions.  Duration of problem: Over one month; Severity of problem: moderate  Objective: Mood: Depressed and Affect: Appropriate and Tearful Risk of harm to self or others: No plan to harm self or others  Life Context: Family and Social: Pt lives with children (10,8,5,almost 2; over 3wo newborn); pt's grandmother staying w pt temporarily School/Work: Pt back to work 2wks postpartum; fulltime Self-Care: Recognizing a greater need for self-care Life Changes: Breakup with FOB; childbirth after tubal  Patient and/or Family's Strengths/Protective Factors: Concrete supports in place (healthy food, safe environments, etc.), Sense of purpose, and Physical Health (exercise, healthy diet, medication compliance, etc.)  Goals Addressed: Patient will: Reduce symptoms of: anxiety, depression, and stress Increase knowledge and/or ability of: coping skills and healthy habits  Demonstrate ability to: Increase healthy adjustment to current life circumstances and Increase adequate support systems for patient/family  Progress towards Goals: Ongoing  Interventions: Interventions utilized: Solution-Focused Strategies, Medication Monitoring, Psychoeducation and/or Health Education, and Link to [employer]   Standardized Assessments completed: GAD-7 and PHQ 9  Patient and/or Family Response: Pt agrees with treatment plan  Patient Centered Plan: Patient is on the following Treatment Plan(s):  IBH  Assessment: Patient currently experiencing Adjustment disorder with mixed anxious and depressed mood and Psychosocial stress.   Patient may benefit from psychoeducation and brief therapeutic interventions regarding coping with symptoms of anxiety, depression, life stress .  Plan: Follow up with behavioral health clinician on : Two weeks Behavioral recommendations:  -Continue taking Zoloft and prenatal vitamins as prescribed -Begin Worry Time strategy today (set phone timer for beginning and end time daily for next two weeks) -Consider registering for new mom online support group, for additional emotional support -Continue using pillows at night, as discussed; consider adding sleep sounds, if helpful Referral(s): Integrated Art gallery manager (In Clinic) and [employer] Resources:  new mom online support  [name], [place]  Depression screen [facility] 2/9 08/22/2021 02/17/2021 11/05/2019 02/12/2019 01/15/2019  Decreased Interest 2 0 1 0 0  Down, Depressed, Hopeless 3 2 1  0 0  PHQ - 2 Score 5 2 2  0 0  Altered sleeping 2 0 1 1 -  Tired, decreased energy 2 1 1 1  -  Change in appetite 2 1 1 1  -  Feeling bad or failure about yourself  3 2 1  0 -  Trouble concentrating 0 0 0 0 -  Moving slowly or fidgety/restless 0 0 0 0 -  Suicidal thoughts 0 0 0 0 -  PHQ-9 Score 14 6 6 3  -  Difficult doing work/chores - - Not difficult at all - -  Some recent data might be hidden   GAD 7 : Generalized Anxiety Score 08/22/2021 02/17/2021  Nervous, Anxious, on Edge 0 1  Control/stop worrying 2 1  Worry too much - different things 3 1  Trouble relaxing 0 0  Restless 1 0  Easily annoyed or irritable 0 0  Afraid - awful might happen 0 0  Total GAD 7 Score 6 3

## 2021-08-22 ENCOUNTER — Ambulatory Visit (INDEPENDENT_AMBULATORY_CARE_PROVIDER_SITE_OTHER): Payer: BC Managed Care – PPO | Admitting: Clinical

## 2021-08-22 ENCOUNTER — Other Ambulatory Visit: Payer: Self-pay

## 2021-08-22 DIAGNOSIS — Z658 Other specified problems related to psychosocial circumstances: Secondary | ICD-10-CM

## 2021-08-22 DIAGNOSIS — F4323 Adjustment disorder with mixed anxiety and depressed mood: Secondary | ICD-10-CM | POA: Diagnosis not present

## 2021-08-22 NOTE — BH Specialist Note (Signed)
Pt did not arrive to video visit and did not answer the phone; Left HIPPA-compliant message to call back Araiya Tilmon from [facility] at  [phone] (Joron Velis's office).  ?; left MyChart message for patient.  ? ?

## 2021-08-22 NOTE — Patient Instructions (Signed)
[facility] [address] [place], [place] [address] [phone] (main office) [phone] (Pearlina Friedly's office)  Online Support Groups Www.postpartum.net Www.conehealthybaby.com  /Emotional Wellbeing Apps and Websites Here are a few free apps meant to help you to help yourself.  To find, try searching on the internet to see if the app is offered on Apple/Android devices. If your first choice doesn't come up on your device, the good news is that there are many choices! Play around with different apps to see which ones are helpful to you.    Calm This is an app meant to help increase calm feelings. Includes info, strategies, and tools for tracking your feelings.      Calm Harm  This app is meant to help with self-harm. Provides many 5-minute or 15-min coping strategies for doing instead of hurting yourself.       Healthy Minds Health Minds is a problem-solving tool to help deal with emotions and cope with stress you encounter wherever you are.      MindShift This app can help people cope with anxiety. Rather than trying to avoid anxiety, you can make an important shift and face it.      MY3  MY3 features a support system, safety plan and resources with the goal of offering a tool to use in a time of need.       My Life My Voice  This mood journal offers a simple solution for tracking your thoughts, feelings and moods. Animated emoticons can help identify your mood.       Relax Melodies Designed to help with sleep, on this app you can mix sounds and meditations for relaxation.      Smiling Mind Smiling Mind is meditation made easy: it's a simple tool that helps put a smile on your mind.        Stop, Breathe & Think  A friendly, simple guide for people through meditations for mindfulness and compassion.  Stop, Breathe and Think Kids Enter your current feelings and choose a "mission" to help you cope. Offers  videos for certain moods instead of just sound recordings.       Team Orange The goal of this tool is to help teens change how they think, act, and react. This app helps you focus on your own good feelings and experiences.      The [employer] Box The [employer] Box (VHB) contains simple tools to help patients with coping, relaxation, distraction, and positive thinking.

## 2021-09-05 ENCOUNTER — Ambulatory Visit: Payer: Medicaid Other | Admitting: Clinical

## 2021-09-05 DIAGNOSIS — Z91199 Patient's noncompliance with other medical treatment and regimen due to unspecified reason: Secondary | ICD-10-CM

## 2021-09-06 ENCOUNTER — Ambulatory Visit: Payer: BC Managed Care – PPO | Admitting: Obstetrics & Gynecology

## 2021-09-16 NOTE — Progress Notes (Signed)
POSTPARTUM VISIT Patient name: Patricia [name] MRN [id number]  Date of birth: 1990-12-10 Chief Complaint:   Postpartum Care and Contraception  History of Present Illness:   Patricia [name] is a 30 y.o. [id number] female being seen today for a postpartum visit. She is 7 weeks postpartum following a spontaneous vaginal delivery at 76 gestational weeks on 07/27/2021. IOL: Yes, for A2 DM and TYPE 2 DM.   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 GDMA2- discontinued metformin- followed by PCP Recently started on weight management medication Anxiety- increased to 142m followed by PCP  Prior tubal ligation, followed by pregnancy- [] desires to discuss bilateral salpingectomy  Last pap smear: 01/2019 neg    Postpartum course has been uncomplicated.  Bleeding no bleeding. Bowel function is normal. Bladder function is normal. Urinary incontinence? No, fecal incontinence? No Patient is sexually active. Used plan B and pull out.  Pregnancy test negative Desired contraception:  interval salpingectomy . Patient does not want a pregnancy in the future.     Upstream - 09/19/21 1111       Pregnancy Intention Screening   Does the patient want to become pregnant in the next year? No    Does the patient's partner want to become pregnant in the next year? No    Would the patient like to discuss contraceptive options today? Yes      Contraception Wrap Up   Current Method Withdrawal or Other Method;Emergency Contraception    End Method Female Sterilization    Contraception Counseling Provided Yes            The pregnancy intention screening data noted above was reviewed. Potential methods of contraception were discussed. The patient elected to proceed with Female Sterilization.  Edinburgh Postpartum Depression Screening: Positive on zoloft- followed by PCP.  Denies SI/HI  Edinburgh Postnatal Depression Scale - 09/19/21 1114       Edinburgh Postnatal Depression Scale:  In the Past  7 Days   I have been able to laugh and see the funny side of things. 1    I have looked forward with enjoyment to things. 1    I have blamed myself unnecessarily when things went wrong. 3    I have been anxious or worried for no good reason. 3    I have felt scared or panicky for no good reason. 1    Things have been getting on top of me. 2    I have been so unhappy that I have had difficulty sleeping. 2    I have felt sad or miserable. 2    I have been so unhappy that I have been crying. 2    The thought of harming myself has occurred to me. 0    Edinburgh Postnatal Depression Scale Total 17             Baby's course has been complicated by RSV . Baby is feeding by bottle. Infant has a pediatrician/family doctor? Yes.  Childcare strategy if returning to work/school: yes.  Pt has material needs met for her and baby: Yes.    [name]:   Pertinent items are noted in HPI Denies Abnormal vaginal discharge w/ itching/odor/irritation, headaches, visual changes, shortness of breath, chest pain, abdominal pain, severe nausea/vomiting, or problems with urination or bowel movements. Pertinent History Reviewed:  Reviewed past medical,surgical, obstetrical and family history.  Reviewed problem list, medications and allergies. OB History  Gravida Para Term Preterm AB Living  6 5 4  _0 SAB IAB Ectopic Multiple Live Births  1     0 5    # Outcome Date GA Lbr Len/2nd Weight Sex Delivery Anes PTL Lv  6 Term 07/27/21 54w0d00:47 / 00:02 7 lb 1.4 oz (3.215 kg) M Vag-Spont None  LIV  5 Term 09/15/19 447w1d3:05 / 00:04 7 lb 1.4 oz (3.215 kg) F Vag-Spont EPI  LIV  4 SAB 02/02/18          3 Term 10/04/15 3941w0d:47 / 00:08 6 lb 9.1 oz (2.98 kg) F Vag-Spont None N LIV  2 Preterm 04/20/13 35w15w3d57 / 00:03 6 lb 1 oz (2.75 kg) M Vag-Spont EPI Y LIV     Complications: Preterm premature rupture of membranes (PPROM) delivered, current hospitalization  1 Term 07/31/11 38w49w4d7 / 00:46 8  lb (3.63 kg) M Vag-Spont EPI N LIV     Complications: Gestational diabetes   Physical Assessment:   Vitals:   09/19/21 1106  BP: 118/67  Pulse: 67  Weight: (!) 318 lb 12.8 oz (144.6 kg)  Height: 5' 7" (1.702 m)  Body mass index is 49.93 kg/m.       Physical Examination:   General appearance: alert, well appearing, and in no distress  Mental status: alert, oriented to person, place, and time  Skin: warm & dry   Cardiovascular: normal heart rate noted   Respiratory: normal respiratory effort, no distress   Breasts: no masses or evidence of infection  Abdomen: obese, soft, non-tender, prior umbilical incisions x2 noted  Pelvic: normal external genitalia, vulva, vagina, cervix, uterus and adnexa.  Bimanual exam limited due to body habitus  Extremities: no edema  Chaperone: Angel Neas         No results found for this or any previous visit (from the past 24 hour(s)).  Assessment & Plan:  1) Postpartum exam 2) 7 wks s/p spontaneous vaginal delivery 3) bottle feeding 4) Depression screening- on zoloft, recently increased 5) Contraception counseling- plan to start on OCPs until scheduled surgery.  Salpingectomy scheduled with Dr. Eure Elonda Husky2/2022) Reviewed risk/benefit including but not limited to risk of bleeding, infection and injury.  OCP risk assessment: Pt denies personal history of VTE, stroke or heart attack.  Denies personal h/o breast cancer.  Pt is either a non-smoker or smoker under the age of 30yo.25yonies h/o migraines with aura.    -Previously on OCPs without issues encouraged condoms as well since 2nd pregnancy was while on OCPs.   Essential components of care per ACOG recommendations:  1.  Mood and well being:  If positive depression screen, discussed and plan developed.  If using tobacco we discussed reduction/cessation and risk of relapse If current substance abuse, we discussed and referral to local resources was offered.  2. Physical recovery  If pt had a  C/S, assessed incisional pain and providing guidance on normal vs prolonged recovery If pt had a laceration, perineal healing and pain reviewed.  If urinary or fecal incontinence, discussed management and referred to PT or uro/gyn if indicated  Patient is safe to resume physical activity. Discussed attainment of healthy weight.  3.  Chronic disease management Pt being followed by PCP, upcoming lab work in the next week  4. Health maintenance Mammogram at 30yo 80yoarlier if indicated Pap smears up to date  Meds: No orders of the defined types were placed in this encounter.   Follow-up: Return for to be determined.   No orders of  the defined types were placed in this encounter.   Janyth Pupa, DO Attending [place], [facility] for [employer], [place]

## 2021-09-19 ENCOUNTER — Ambulatory Visit (INDEPENDENT_AMBULATORY_CARE_PROVIDER_SITE_OTHER): Payer: BC Managed Care – PPO | Admitting: Obstetrics & Gynecology

## 2021-09-19 ENCOUNTER — Encounter: Payer: Self-pay | Admitting: Obstetrics & Gynecology

## 2021-09-19 ENCOUNTER — Other Ambulatory Visit: Payer: Self-pay

## 2021-09-19 DIAGNOSIS — Z3009 Encounter for other general counseling and advice on contraception: Secondary | ICD-10-CM

## 2021-09-19 DIAGNOSIS — Z30011 Encounter for initial prescription of contraceptive pills: Secondary | ICD-10-CM | POA: Diagnosis not present

## 2021-09-23 NOTE — Patient Instructions (Addendum)
Salpingectomy, Care After The following information offers guidance on how to care for yourself after your procedure. Your health care provider may also give you more specific instructions. If you have problems or questions, contact your health care provider. What can I expect after the procedure? After the procedure, it is common to have: Pain in your abdomen. Light vaginal bleeding (spotting) for a few days. Tiredness. Your recovery time will depend on which method was used for your surgery. Follow these instructions at home: Medicines Take over-the-counter and prescription medicines only as told by your health care provider. Ask your health care provider if the medicine prescribed to you: Requires you to avoid driving or using machinery. Can cause constipation. You may need to take actions to prevent or treat constipation, such as: Drink enough fluid to keep your urine pale yellow. Take over-the-counter or prescription medicines. Eat foods that are high in fiber, such as beans, whole grains, and fresh fruits and vegetables. Limit foods that are high in fat and processed sugars, such as fried or sweet foods. Incision care  Follow instructions from your health care provider about how to take care of your incision or incisions. Make sure you: Wash your hands with soap and water for at least 20 seconds before and after you change your bandage (dressing). If soap and water are not available, use hand sanitizer. Change or remove your dressing as told by your health care provider. Leave stitches (sutures), skin glue, staples, or adhesive strips in place. These skin closures may need to stay in place for 2 weeks or longer. If adhesive strip edges start to loosen and curl up, you may trim the loose edges. Do not remove adhesive strips completely unless your health care provider tells you to do that. Keep your dressing clean and dry. Check your incision area every day for signs of  infection. Check for: Redness, swelling, or pain that gets worse. Fluid or blood. Warmth. Pus or a bad smell. Activity Rest as told by your health care provider. Avoid sitting for a long time without moving. Get up to take short walks every 1-2 hours. This is important to improve blood flow and breathing. Ask for help if you feel weak or unsteady. Return to your normal activities as told by your health care provider. Ask your health care provider what activities are safe for you. Do not drive until your health care provider says that it is safe. Do not lift anything that is heavier than 10 lb (4.5 kg), or the limit that you are told, until your health care provider says that it is safe. This may last for 2-6 weeks depending on your surgery. Do not douche, use tampons, or have sex until your health care provider approves. General instructions Do not use any products that contain nicotine or tobacco. These products include cigarettes, chewing tobacco, and vaping devices, such as e-cigarettes. These can delay healing after surgery. If you need help quitting, ask your health care provider. Wear compression stockings as told by your health care provider. These stockings help to prevent blood clots and reduce swelling in your legs. Do not take baths, swim, or use a hot tub until your health care provider approves. You may take showers. Keep all follow-up visits. This is important. Contact a health care provider if: You have pain when you urinate. You have redness, swelling, or more pain around an incision or an incision feels warm to the touch. You have pus, fluid,  blood, or a bad smell coming from an incision or an incision starts to open. You have a fever. You have abdominal pain that gets worse or does not get better with medicine. You have a rash. You feel light-headed, have nausea and vomiting, or both. Get help right away if: You have pain in your chest or leg. You develop shortness of  breath. You faint. You have increased or heavy vaginal bleeding, such as soaking a sanitary napkin in an hour. These symptoms may represent a serious problem that is an emergency. Do not wait to see if the symptoms will go away. Get medical help right away. Call your local emergency services (911 in the [place] Do not drive yourself to the hospital. Summary After the procedure, it is common to feel tired, have pain in your abdomen, and have light vaginal bleeding for a few days. Follow instructions from your health care provider about how to take care of your incision or incisions. Return to your normal activities as told by your health care provider. Ask your health care provider what activities are safe for you. Do not douche, use tampons, or have sex until your health care provider approves. Keep all follow-up visits. This is important. This information is not intended to replace advice given to you by your health care provider. Make sure you discuss any questions you have with your health care provider. Document Revised: 10/05/2020 Document Reviewed: 10/05/2020 Elsevier Patient Education  2022 [employer] General Anesthesia, Adult, Care After This sheet gives you information about how to care for yourself after your procedure. Your health care provider may also give you more specific instructions. If you have problems or questions, contact your health care provider. What can I expect after the procedure? After the procedure, the following side effects are common: Pain or discomfort at the IV site. Nausea. Vomiting. Sore throat. Trouble concentrating. Feeling cold or chills. Feeling weak or tired. Sleepiness and fatigue. Soreness and body aches. These side effects can affect parts of the body that were not involved in surgery. Follow these instructions at home: For the time period you were told by your health care provider:  Rest. Do not participate in activities where you could  fall or become injured. Do not drive or use machinery. Do not drink alcohol. Do not take sleeping pills or medicines that cause drowsiness. Do not make important decisions or sign legal documents. Do not take care of children on your own. Eating and drinking Follow any instructions from your health care provider about eating or drinking restrictions. When you feel hungry, start by eating small amounts of foods that are soft and easy to digest (bland), such as toast. Gradually return to your regular diet. Drink enough fluid to keep your urine pale yellow. If you vomit, rehydrate by drinking water, juice, or clear broth. General instructions If you have sleep apnea, surgery and certain medicines can increase your risk for breathing problems. Follow instructions from your health care provider about wearing your sleep device: Anytime you are sleeping, including during daytime naps. While taking prescription pain medicines, sleeping medicines, or medicines that make you drowsy. Have a responsible adult stay with you for the time you are told. It is important to have someone help care for you until you are awake and alert. Return to your normal activities as told by your health care provider. Ask your health care provider what activities are safe for you. Take over-the-counter and prescription medicines only as told by your  health care provider. If you smoke, do not smoke without supervision. Keep all follow-up visits as told by your health care provider. This is important. Contact a health care provider if: You have nausea or vomiting that does not get better with medicine. You cannot eat or drink without vomiting. You have pain that does not get better with medicine. You are unable to pass urine. You develop a skin rash. You have a fever. You have redness around your IV site that gets worse. Get help right away if: You have difficulty breathing. You have chest pain. You have blood in your  urine or stool, or you vomit blood. Summary After the procedure, it is common to have a sore throat or nausea. It is also common to feel tired. Have a responsible adult stay with you for the time you are told. It is important to have someone help care for you until you are awake and alert. When you feel hungry, start by eating small amounts of foods that are soft and easy to digest (bland), such as toast. Gradually return to your regular diet. Drink enough fluid to keep your urine pale yellow. Return to your normal activities as told by your health care provider. Ask your health care provider what activities are safe for you. This information is not intended to replace advice given to you by your health care provider. Make sure you discuss any questions you have with your health care provider. Document Revised: 07/29/2020 Document Reviewed: 02/26/2020 Elsevier Patient Education  2022 [employer] How to Use Chlorhexidine for Bathing Chlorhexidine gluconate (CHG) is a germ-killing (antiseptic) solution that is used to clean the skin. It can get rid of the bacteria that normally live on the skin and can keep them away for about 24 hours. To clean your skin with CHG, you may be given: A CHG solution to use in the shower or as part of a sponge bath. A prepackaged cloth that contains CHG. Cleaning your skin with CHG may help lower the risk for infection: While you are staying in the intensive care unit of the hospital. If you have a vascular access, such as a central line, to provide short-term or long-term access to your veins. If you have a catheter to drain urine from your bladder. If you are on a ventilator. A ventilator is a machine that helps you breathe by moving air in and out of your lungs. After surgery. What are the risks? Risks of using CHG include: A skin reaction. Hearing loss, if CHG gets in your ears and you have a perforated eardrum. Eye injury, if CHG gets in your eyes and is  not rinsed out. The CHG product catching fire. Make sure that you avoid smoking and flames after applying CHG to your skin. Do not use CHG: If you have a chlorhexidine allergy or have previously reacted to chlorhexidine. On babies younger than 61 months of age. How to use CHG solution Use CHG only as told by your health care provider, and follow the instructions on the label. Use the full amount of CHG as directed. Usually, this is one bottle. During a shower Follow these steps when using CHG solution during a shower (unless your health care provider gives you different instructions): Start the shower. Use your normal soap and shampoo to wash your face and hair. Turn off the shower or move out of the shower stream. Pour the CHG onto a clean washcloth. Do not use any type of brush or rough-edged  sponge. Starting at your neck, lather your body down to your toes. Make sure you follow these instructions: If you will be having surgery, pay special attention to the part of your body where you will be having surgery. Scrub this area for at least 1 minute. Do not use CHG on your head or face. If the solution gets into your ears or eyes, rinse them well with water. Avoid your genital area. Avoid any areas of skin that have broken skin, cuts, or scrapes. Scrub your back and under your arms. Make sure to wash skin folds. Let the lather sit on your skin for 1-2 minutes or as long as told by your health care provider. Thoroughly rinse your entire body in the shower. Make sure that all body creases and crevices are rinsed well. Dry off with a clean towel. Do not put any substances on your body afterward--such as powder, lotion, or perfume--unless you are told to do so by your health care provider. Only use lotions that are recommended by the manufacturer. Put on clean clothes or pajamas. If it is the night before your surgery, sleep in clean sheets.  During a sponge bath Follow these steps when using  CHG solution during a sponge bath (unless your health care provider gives you different instructions): Use your normal soap and shampoo to wash your face and hair. Pour the CHG onto a clean washcloth. Starting at your neck, lather your body down to your toes. Make sure you follow these instructions: If you will be having surgery, pay special attention to the part of your body where you will be having surgery. Scrub this area for at least 1 minute. Do not use CHG on your head or face. If the solution gets into your ears or eyes, rinse them well with water. Avoid your genital area. Avoid any areas of skin that have broken skin, cuts, or scrapes. Scrub your back and under your arms. Make sure to wash skin folds. Let the lather sit on your skin for 1-2 minutes or as long as told by your health care provider. Using a different clean, wet washcloth, thoroughly rinse your entire body. Make sure that all body creases and crevices are rinsed well. Dry off with a clean towel. Do not put any substances on your body afterward--such as powder, lotion, or perfume--unless you are told to do so by your health care provider. Only use lotions that are recommended by the manufacturer. Put on clean clothes or pajamas. If it is the night before your surgery, sleep in clean sheets. How to use CHG prepackaged cloths Only use CHG cloths as told by your health care provider, and follow the instructions on the label. Use the CHG cloth on clean, dry skin. Do not use the CHG cloth on your head or face unless your health care provider tells you to. When washing with the CHG cloth: Avoid your genital area. Avoid any areas of skin that have broken skin, cuts, or scrapes. Before surgery Follow these steps when using a CHG cloth to clean before surgery (unless your health care provider gives you different instructions): Using the CHG cloth, vigorously scrub the part of your body where you will be having surgery. Scrub using a  back-and-forth motion for 3 minutes. The area on your body should be completely wet with CHG when you are done scrubbing. Do not rinse. Discard the cloth and let the area air-dry. Do not put any substances on the area afterward, such as powder,  lotion, or perfume. Put on clean clothes or pajamas. If it is the night before your surgery, sleep in clean sheets.  For general bathing Follow these steps when using CHG cloths for general bathing (unless your health care provider gives you different instructions). Use a separate CHG cloth for each area of your body. Make sure you wash between any folds of skin and between your fingers and toes. Wash your body in the following order, switching to a new cloth after each step: The front of your neck, shoulders, and chest. Both of your arms, under your arms, and your hands. Your stomach and groin area, avoiding the genitals. Your right leg and foot. Your left leg and foot. The back of your neck, your back, and your buttocks. Do not rinse. Discard the cloth and let the area air-dry. Do not put any substances on your body afterward--such as powder, lotion, or perfume--unless you are told to do so by your health care provider. Only use lotions that are recommended by the manufacturer. Put on clean clothes or pajamas. Contact a health care provider if: Your skin gets irritated after scrubbing. You have questions about using your solution or cloth. You swallow any chlorhexidine. Call your local poison control center [phone] in the [place] Get help right away if: Your eyes itch badly, or they become very red or swollen. Your skin itches badly and is red or swollen. Your hearing changes. You have trouble seeing. You have swelling or tingling in your mouth or throat. You have trouble breathing. These symptoms may represent a serious problem that is an emergency. Do not wait to see if the symptoms will go away. Get medical help right away. Call your  local emergency services (911 in the [place] Do not drive yourself to the hospital. Summary Chlorhexidine gluconate (CHG) is a germ-killing (antiseptic) solution that is used to clean the skin. Cleaning your skin with CHG may help to lower your risk for infection. You may be given CHG to use for bathing. It may be in a bottle or in a prepackaged cloth to use on your skin. Carefully follow your health care provider's instructions and the instructions on the product label. Do not use CHG if you have a chlorhexidine allergy. Contact your health care provider if your skin gets irritated after scrubbing. This information is not intended to replace advice given to you by your health care provider. Make sure you discuss any questions you have with your health care provider. Document Revised: 01/24/2021 Document Reviewed: 01/24/2021 Elsevier Patient Education  2022 Elsevier Inc.    KIMLY STEIERT  09/23/2021     @[email or web address] @   Your procedure is scheduled on  09/28/2021.   Report to [facility] at  1000 A.M.   Call this number if you have problems the morning of surgery:  [phone]   Remember:  Do not eat  after midnight.   You may drink clear liquids until  0725.  Clear liquids allowed are:                    Water, Juice (non-citric and without pulp - diabetics please choose diet or no sugar options), Carbonated beverages - (diabetics please choose diet or no sugar options), Clear Tea, Black Coffee only (no creamer, milk or cream including half and half), Plain Jell-O only (diabetics please choose diet or no sugar options), Gatorade (diabetics please choose diet or no sugar options), and Plain Popsicles only      At  0725  drink your carb drink. After you drink this, you cannot have anything else to drink.    Take these medicines the morning of surgery with A SIP OF WATER  zoloft.      Do not wear jewelry, make-up or nail polish.  Do not wear lotions, powders, or perfumes, or  deodorant.  Do not shave 48 hours prior to surgery.  Men may shave face and neck.  Do not bring valuables to the hospital.  [facility] is not responsible for any belongings or valuables.  Contacts, dentures or bridgework may not be worn into surgery.  Leave your suitcase in the car.  After surgery it may be brought to your room.  For patients admitted to the hospital, discharge time will be determined by your treatment team.  Patients discharged the day of surgery will not be allowed to drive home and must have someone with them for 24 hours.    Special instructions:   DO NOT smoke tobacco or vape for 24 hours before your procedure.  Please read over the following fact sheets that you were given. Coughing and Deep Breathing, Surgical Site Infection Prevention, Anesthesia Post-op Instructions, and Care and Recovery After Surgery

## 2021-09-26 ENCOUNTER — Other Ambulatory Visit: Payer: Self-pay

## 2021-09-26 ENCOUNTER — Encounter (HOSPITAL_COMMUNITY)
Admission: RE | Admit: 2021-09-26 | Discharge: 2021-09-26 | Disposition: A | Discharge status: Home / Self Care | Payer: BC Managed Care – PPO | Source: Ambulatory Visit | Attending: Obstetrics & Gynecology | Admitting: Obstetrics & Gynecology

## 2021-09-26 ENCOUNTER — Encounter (HOSPITAL_COMMUNITY): Payer: Self-pay

## 2021-09-26 ENCOUNTER — Other Ambulatory Visit: Payer: Self-pay | Admitting: Obstetrics & Gynecology

## 2021-09-26 DIAGNOSIS — Z01812 Encounter for preprocedural laboratory examination: Secondary | ICD-10-CM | POA: Insufficient documentation

## 2021-09-26 DIAGNOSIS — Z01818 Encounter for other preprocedural examination: Secondary | ICD-10-CM

## 2021-09-26 DIAGNOSIS — Z302 Encounter for sterilization: Secondary | ICD-10-CM | POA: Diagnosis present

## 2021-09-26 LAB — CBC
HCT: 35.7 % — ABNORMAL LOW (ref 36.0–46.0)
Hemoglobin: 11.4 g/dL — ABNORMAL LOW (ref 12.0–15.0)
MCH: 27 pg (ref 26.0–34.0)
MCHC: 31.9 g/dL (ref 30.0–36.0)
MCV: 84.6 fL (ref 80.0–100.0)
Platelets: 227 10*3/uL (ref 150–400)
RBC: 4.22 MIL/uL (ref 3.87–5.11)
RDW: 13.2 % (ref 11.5–15.5)
WBC: 5.3 10*3/uL (ref 4.0–10.5)
nRBC: 0 % (ref 0.0–0.2)

## 2021-09-26 LAB — COMPREHENSIVE METABOLIC PANEL
ALT: 30 U/L (ref 0–44)
AST: 21 U/L (ref 15–41)
Albumin: 3.7 g/dL (ref 3.5–5.0)
Alkaline Phosphatase: 49 U/L (ref 38–126)
Anion gap: 6 (ref 5–15)
BUN: 15 mg/dL (ref 6–20)
CO2: 26 mmol/L (ref 22–32)
Calcium: 8.8 mg/dL — ABNORMAL LOW (ref 8.9–10.3)
Chloride: 104 mmol/L (ref 98–111)
Creatinine, Ser: 0.98 mg/dL (ref 0.44–1.00)
GFR, Estimated: >60 mL/min (ref >60)
Glucose, Bld: 88 mg/dL (ref 70–99)
Potassium: 3.8 mmol/L (ref 3.5–5.1)
Sodium: 136 mmol/L (ref 135–145)
Total Bilirubin: 0.8 mg/dL (ref 0.3–1.2)
Total Protein: 7.1 g/dL (ref 6.5–8.1)

## 2021-09-26 LAB — URINALYSIS, ROUTINE W REFLEX MICROSCOPIC
Bilirubin Urine: NEGATIVE
Glucose, UA: NEGATIVE mg/dL
Hgb urine dipstick: NEGATIVE
Ketones, ur: NEGATIVE mg/dL
Leukocytes,Ua: NEGATIVE
Nitrite: NEGATIVE
Protein, ur: NEGATIVE mg/dL
Specific Gravity, Urine: 1.023 (ref 1.005–1.030)
pH: 5 (ref 5.0–8.0)

## 2021-09-26 LAB — HCG, QUANTITATIVE, PREGNANCY: hCG, Beta Chain, Quant, S: <1 m[IU]/mL (ref <5)

## 2021-09-28 ENCOUNTER — Ambulatory Visit (HOSPITAL_COMMUNITY)
Admission: RE | Admit: 2021-09-28 | Discharge: 2021-09-28 | Disposition: A | Discharge status: Home / Self Care | Payer: BC Managed Care – PPO | Attending: Obstetrics & Gynecology | Admitting: Obstetrics & Gynecology

## 2021-09-28 ENCOUNTER — Encounter (HOSPITAL_COMMUNITY): Payer: Self-pay | Admitting: Obstetrics & Gynecology

## 2021-09-28 ENCOUNTER — Other Ambulatory Visit: Payer: Self-pay

## 2021-09-28 ENCOUNTER — Encounter (HOSPITAL_COMMUNITY)
Admission: RE | Disposition: A | Discharge status: Home / Self Care | Payer: Self-pay | Source: Home / Self Care | Attending: Obstetrics & Gynecology

## 2021-09-28 ENCOUNTER — Ambulatory Visit (HOSPITAL_COMMUNITY): Payer: BC Managed Care – PPO | Admitting: Certified Registered Nurse Anesthetist

## 2021-09-28 DIAGNOSIS — Z302 Encounter for sterilization: Secondary | ICD-10-CM

## 2021-09-28 HISTORY — PX: LAPAROSCOPIC BILATERAL SALPINGECTOMY: SHX5889

## 2021-09-28 SURGERY — SALPINGECTOMY, BILATERAL, LAPAROSCOPIC
Anesthesia: General | Site: Abdomen | Laterality: Bilateral

## 2021-09-28 MED ORDER — CEFAZOLIN IN SODIUM CHLORIDE 3-0.9 GM/100ML-% IV SOLN
INTRAVENOUS | Status: AC
  Filled 2021-09-28: qty 100

## 2021-09-28 MED ORDER — ROCURONIUM BROMIDE 10 MG/ML (PF) SYRINGE
PREFILLED_SYRINGE | INTRAVENOUS | Status: AC
  Filled 2021-09-28: qty 10

## 2021-09-28 MED ORDER — SODIUM CHLORIDE 0.9 % IR SOLN
Status: DC | PRN
  Administered 2021-09-28: 1000 mL

## 2021-09-28 MED ORDER — FENTANYL CITRATE (PF) 100 MCG/2ML IJ SOLN
INTRAMUSCULAR | Status: DC | PRN
  Administered 2021-09-28 (×2): 50 ug via INTRAVENOUS
  Administered 2021-09-28: 100 ug via INTRAVENOUS

## 2021-09-28 MED ORDER — MIDAZOLAM HCL 2 MG/2ML IJ SOLN
INTRAMUSCULAR | Status: AC
  Filled 2021-09-28: qty 2

## 2021-09-28 MED ORDER — ROCURONIUM BROMIDE 10 MG/ML (PF) SYRINGE
PREFILLED_SYRINGE | INTRAVENOUS | Status: DC | PRN
  Administered 2021-09-28: 10 mg via INTRAVENOUS
  Administered 2021-09-28: 50 mg via INTRAVENOUS

## 2021-09-28 MED ORDER — ONDANSETRON 8 MG PO TBDP: 8.0000 mg | ORAL_TABLET | Freq: Three times a day (TID) | ORAL | 0 refills | Status: DC | PRN

## 2021-09-28 MED ORDER — FENTANYL CITRATE PF 50 MCG/ML IJ SOSY: 25.0000 ug | PREFILLED_SYRINGE | INTRAMUSCULAR | Status: DC | PRN

## 2021-09-28 MED ORDER — ONDANSETRON HCL 4 MG/2ML IJ SOLN: 4.0000 mg | Freq: Once | INTRAMUSCULAR | Status: DC | PRN

## 2021-09-28 MED ORDER — KETOROLAC TROMETHAMINE 30 MG/ML IJ SOLN: 30.0000 mg | Freq: Once | INTRAMUSCULAR | Status: AC

## 2021-09-28 MED ORDER — POVIDONE-IODINE 10 % EX SWAB: 2.0000 "application " | Freq: Once | CUTANEOUS | Status: DC

## 2021-09-28 MED ORDER — DEXAMETHASONE SODIUM PHOSPHATE 10 MG/ML IJ SOLN
INTRAMUSCULAR | Status: AC
  Filled 2021-09-28: qty 1

## 2021-09-28 MED ORDER — DEXAMETHASONE SODIUM PHOSPHATE 10 MG/ML IJ SOLN
INTRAMUSCULAR | Status: DC | PRN
  Administered 2021-09-28: 10 mg via INTRAVENOUS

## 2021-09-28 MED ORDER — PROPOFOL 10 MG/ML IV BOLUS
INTRAVENOUS | Status: AC
  Filled 2021-09-28: qty 20

## 2021-09-28 MED ORDER — BUPIVACAINE LIPOSOME 1.3 % IJ SUSP
INTRAMUSCULAR | Status: DC | PRN
  Administered 2021-09-28: 20 mL

## 2021-09-28 MED ORDER — CEFAZOLIN IN SODIUM CHLORIDE 3-0.9 GM/100ML-% IV SOLN
3.0000 g | INTRAVENOUS | Status: AC
  Administered 2021-09-28: 3 g via INTRAVENOUS

## 2021-09-28 MED ORDER — LIDOCAINE HCL (PF) 2 % IJ SOLN
INTRAMUSCULAR | Status: AC
  Filled 2021-09-28: qty 5

## 2021-09-28 MED ORDER — CEFAZOLIN SODIUM-DEXTROSE 2-4 GM/100ML-% IV SOLN: 2.0000 g | INTRAVENOUS | Status: DC

## 2021-09-28 MED ORDER — SUGAMMADEX SODIUM 500 MG/5ML IV SOLN
INTRAVENOUS | Status: DC | PRN
Start: 2021-09-28 — End: 2021-09-28
  Administered 2021-09-28: 300 mg via INTRAVENOUS

## 2021-09-28 MED ORDER — CHLORHEXIDINE GLUCONATE 0.12 % MT SOLN
OROMUCOSAL | Status: AC
  Filled 2021-09-28: qty 15

## 2021-09-28 MED ORDER — ONDANSETRON HCL 4 MG/2ML IJ SOLN
INTRAMUSCULAR | Status: AC
  Filled 2021-09-28: qty 2

## 2021-09-28 MED ORDER — BUPIVACAINE LIPOSOME 1.3 % IJ SUSP
20.0000 mL | Freq: Once | INTRAMUSCULAR | Status: DC
  Filled 2021-09-28: qty 20

## 2021-09-28 MED ORDER — LIDOCAINE HCL (CARDIAC) PF 100 MG/5ML IV SOSY
PREFILLED_SYRINGE | INTRAVENOUS | Status: DC | PRN
  Administered 2021-09-28: 60 mg via INTRATRACHEAL

## 2021-09-28 MED ORDER — HYDROCODONE-ACETAMINOPHEN 5-325 MG PO TABS: 1.0000 | ORAL_TABLET | Freq: Four times a day (QID) | ORAL | 0 refills | Status: DC | PRN

## 2021-09-28 MED ORDER — ONDANSETRON HCL 4 MG/2ML IJ SOLN
INTRAMUSCULAR | Status: DC | PRN
  Administered 2021-09-28: 4 mg via INTRAVENOUS

## 2021-09-28 MED ORDER — PHENYLEPHRINE HCL (PRESSORS) 10 MG/ML IV SOLN
INTRAVENOUS | Status: DC | PRN
  Administered 2021-09-28 (×2): 80 ug via INTRAVENOUS
  Administered 2021-09-28 (×2): 120 ug via INTRAVENOUS

## 2021-09-28 MED ORDER — BUPIVACAINE LIPOSOME 1.3 % IJ SUSP
INTRAMUSCULAR | Status: AC
  Filled 2021-09-28: qty 20

## 2021-09-28 MED ORDER — CHLORHEXIDINE GLUCONATE 0.12 % MT SOLN: 15.0000 mL | Freq: Once | OROMUCOSAL | Status: DC

## 2021-09-28 MED ORDER — MIDAZOLAM HCL 2 MG/2ML IJ SOLN
INTRAMUSCULAR | Status: DC | PRN
  Administered 2021-09-28: 2 mg via INTRAVENOUS

## 2021-09-28 MED ORDER — FENTANYL CITRATE (PF) 250 MCG/5ML IJ SOLN
INTRAMUSCULAR | Status: AC
  Filled 2021-09-28: qty 5

## 2021-09-28 MED ORDER — LACTATED RINGERS IV SOLN: INTRAVENOUS | Status: DC | PRN

## 2021-09-28 MED ORDER — KETOROLAC TROMETHAMINE 10 MG PO TABS: 10.0000 mg | ORAL_TABLET | Freq: Three times a day (TID) | ORAL | 0 refills | Status: DC | PRN

## 2021-09-28 MED ORDER — PROPOFOL 10 MG/ML IV BOLUS
INTRAVENOUS | Status: DC | PRN
  Administered 2021-09-28: 250 mg via INTRAVENOUS

## 2021-09-28 MED ORDER — LACTATED RINGERS IV SOLN: INTRAVENOUS | Status: DC

## 2021-09-28 MED ORDER — KETOROLAC TROMETHAMINE 30 MG/ML IJ SOLN
INTRAMUSCULAR | Status: AC
  Administered 2021-09-28: 30 mg via INTRAVENOUS
  Filled 2021-09-28: qty 1

## 2021-09-28 MED ORDER — ORAL CARE MOUTH RINSE: 15.0000 mL | Freq: Once | OROMUCOSAL | Status: DC

## 2021-09-28 MED ORDER — SUCCINYLCHOLINE CHLORIDE 200 MG/10ML IV SOSY
PREFILLED_SYRINGE | INTRAVENOUS | Status: DC | PRN
  Administered 2021-09-28: 160 mg via INTRAVENOUS

## 2021-09-28 MED ORDER — SUCCINYLCHOLINE CHLORIDE 200 MG/10ML IV SOSY
PREFILLED_SYRINGE | INTRAVENOUS | Status: AC
  Filled 2021-09-28: qty 10

## 2021-09-28 MED ORDER — PHENYLEPHRINE 40 MCG/ML (10ML) SYRINGE FOR IV PUSH (FOR BLOOD PRESSURE SUPPORT)
PREFILLED_SYRINGE | INTRAVENOUS | Status: AC
  Filled 2021-09-28: qty 10

## 2021-09-28 SURGICAL SUPPLY — 39 items
ADH SKN CLS APL DERMABOND .7 (GAUZE/BANDAGES/DRESSINGS) ×1
APL SWBSTK 6 STRL LF DISP (MISCELLANEOUS) ×1
APPLICATOR COTTON TIP 6 STRL (MISCELLANEOUS) ×1 IMPLANT
APPLICATOR COTTON TIP 6IN STRL (MISCELLANEOUS) ×2
BAG HAMPER (MISCELLANEOUS) ×2 IMPLANT
BLADE SURG SZ11 CARB STEEL (BLADE) ×2 IMPLANT
CLOTH BEACON ORANGE TIMEOUT ST (SAFETY) ×2 IMPLANT
COVER LIGHT HANDLE STERIS (MISCELLANEOUS) ×4 IMPLANT
DERMABOND ADVANCED (GAUZE/BANDAGES/DRESSINGS) ×1
DERMABOND ADVANCED .7 DNX12 (GAUZE/BANDAGES/DRESSINGS) ×1 IMPLANT
ELECT REM PT RETURN 9FT ADLT (ELECTROSURGICAL) ×2
ELECTRODE REM PT RTRN 9FT ADLT (ELECTROSURGICAL) ×1 IMPLANT
GAUZE 4X4 16PLY ~~LOC~~+RFID DBL (SPONGE) ×4 IMPLANT
GLOVE ECLIPSE 8.0 STRL XLNG CF (GLOVE) ×2 IMPLANT
GLOVE SRG 8 PF TXTR STRL LF DI (GLOVE) ×1 IMPLANT
GLOVE SURG UNDER POLY LF SZ7 (GLOVE) ×6 IMPLANT
GLOVE SURG UNDER POLY LF SZ8 (GLOVE) ×2
GOWN STRL REUS W/TWL LRG LVL3 (GOWN DISPOSABLE) ×2 IMPLANT
GOWN STRL REUS W/TWL XL LVL3 (GOWN DISPOSABLE) ×2 IMPLANT
INST SET LAPROSCOPIC GYN AP (KITS) ×2 IMPLANT
KIT TURNOVER CYSTO (KITS) ×2 IMPLANT
NEEDLE HYPO 18GX1.5 BLUNT FILL (NEEDLE) ×2 IMPLANT
NEEDLE HYPO 21X1.5 SAFETY (NEEDLE) ×2 IMPLANT
NEEDLE INSUFFLATION 14GA 120MM (NEEDLE) ×2 IMPLANT
PACK PERI GYN (CUSTOM PROCEDURE TRAY) ×2 IMPLANT
PAD ARMBOARD 7.5X6 YLW CONV (MISCELLANEOUS) ×2 IMPLANT
SET BASIN LINEN APH (SET/KITS/TRAYS/PACK) ×2 IMPLANT
SET TUBE SMOKE EVAC HIGH FLOW (TUBING) ×2 IMPLANT
SHEARS HARMONIC ACE PLUS 36CM (ENDOMECHANICALS) ×2 IMPLANT
SLEEVE ENDOPATH XCEL 5M (ENDOMECHANICALS) ×2 IMPLANT
SOL ANTI FOG 6CC (MISCELLANEOUS) ×1 IMPLANT
SOLUTION ANTI FOG 6CC (MISCELLANEOUS) ×1
SUT VICRYL 0 UR6 27IN ABS (SUTURE) ×2 IMPLANT
SUT VICRYL AB 3-0 FS1 BRD 27IN (SUTURE) ×2 IMPLANT
SYR 10ML LL (SYRINGE) ×2 IMPLANT
SYR 20ML LL LF (SYRINGE) ×4 IMPLANT
TROCAR ENDO BLADELESS 11MM (ENDOMECHANICALS) ×2 IMPLANT
TROCAR XCEL NON-BLD 5MMX100MML (ENDOMECHANICALS) ×2 IMPLANT
WARMER LAPAROSCOPE (MISCELLANEOUS) ×2 IMPLANT

## 2021-09-28 NOTE — Anesthesia Preprocedure Evaluation (Addendum)
Anesthesia Evaluation  Patient identified by MRN, date of birth, ID band Patient awake    Reviewed: Allergy & Precautions, H&P , NPO status , Patient's Chart, lab work & pertinent test results, reviewed documented beta blocker date and time   Airway Mallampati: II  TM Distance: >3 FB Neck ROM: full    Dental no notable dental hx.    Pulmonary neg pulmonary ROS,    Pulmonary exam normal breath sounds clear to auscultation       Cardiovascular Exercise Tolerance: Good negative cardio ROS   Rhythm:regular Rate:Normal     Neuro/Psych  Headaches, PSYCHIATRIC DISORDERS Anxiety Depression    GI/Hepatic negative GI ROS, Neg liver ROS,   Endo/Other  diabetes, Type obesity  Renal/GU negative Renal ROS  negative genitourinary   Musculoskeletal   Abdominal   Peds  Hematology  (+) Blood dyscrasia, anemia ,   Anesthesia Other Findings   Reproductive/Obstetrics negative OB ROS                            Anesthesia Physical Anesthesia Plan  ASA: 3  Anesthesia Plan: General   Post-op Pain Management:    Induction:   PONV Risk Score and Plan: Propofol infusion  Airway Management Planned:   Additional Equipment:   Intra-op Plan:   Post-operative Plan:   Informed Consent: I have reviewed the patients History and Physical, chart, labs and discussed the procedure including the risks, benefits and alternatives for the proposed anesthesia with the patient or authorized representative who has indicated his/her understanding and acceptance.     Dental Advisory Given  Plan Discussed with: CRNA  Anesthesia Plan Comments:        Anesthesia Quick Evaluation

## 2021-09-28 NOTE — Op Note (Signed)
Preoperative Diagnosis:  1.  Multiparous female desires permanent sterilization                                          2.  Elects to have bilateral salpingectomy for ovarian cancer prophylaxis  3.  Previous Modified [place] failure  Postoperative Diagnosis:  Same as above  Procedure:  Laparoscopic Bilateral Salpingectomy for the purpose of permanent sterilization  Surgeon:  Rockne Coons MD  Anaesthesia: general  Findings:  Patient had normal pelvic anatomy and no intraperitoneal abnormalities.  Description of Operation:  Patient was taken to the OR and placed into supine position where she underwent general anaesthesia.   She was placed in the dorsal lithotomy position and prepped and draped in the usual sterile fashion.   An incision was made above the umbilicus and dissection taken down to the rectus fascia. A Veres needle was used to insufflate the periotneal cavity. An 11 mm non bladed video laparoscope trocar was then placed under direct visualization without difficulty.   The above noted findings were observed.   Two additional 5 mm non bladed trocars were placed in the right and left lower quadrants under direct visualization without difficulty.   The Harmonic scalpel was employed and a salpingectomy of both the right and left tubes was performed.   The tubes were removed from the peritoneal cavity and sent to pathology.   There was good hemostasis bilaterally.   The fascia, peritoneum and subcutaneous tissue were closed using 0 vicryl.   All 3 skin incisions were closed using 3-0 vicryl in a subcuticular fashion.  Exparel 266 mg 20 cc was injected in the 3 incisional/trocar sites.  The patient was awakened from anaesthesia and taken to the PACU with all counts being correct x 3.   The patient received  2 gram of ancef andToradol 30 mg IV preoperatively.  Patricia [name] 09/28/2021 1:00 PM

## 2021-09-28 NOTE — Anesthesia Postprocedure Evaluation (Signed)
Anesthesia Post Note  Patient: Patricia [name]  Procedure(s) Performed: LAPAROSCOPIC BILATERAL SALPINGECTOMY (Bilateral: Abdomen)  Patient location during evaluation: Phase II Anesthesia Type: General Level of consciousness: awake Pain management: pain level controlled Vital Signs Assessment: post-procedure vital signs reviewed and stable Respiratory status: spontaneous breathing and respiratory function stable Cardiovascular status: blood pressure returned to baseline and stable Postop Assessment: no headache and no apparent nausea or vomiting Anesthetic complications: no Comments: Late entry   No notable events documented.   Last Vitals:  Vitals:   09/28/21 1300 09/28/21 1327  BP: 103/78 119/62  Pulse: (!) 103 66  Resp: 18 17  Temp:    SpO2: 100% 100%    Last Pain:  Vitals:   09/28/21 1327  TempSrc:   PainSc: 0-No pain                 [name]

## 2021-09-28 NOTE — Anesthesia Procedure Notes (Signed)
Procedure Name: Intubation Date/Time: 09/28/2021 12:03 PM Performed by: Karna Dupes, CRNA Pre-anesthesia Checklist: Patient identified, Emergency Drugs available, Suction available and Patient being monitored Patient Re-evaluated:Patient Re-evaluated prior to induction Oxygen Delivery Method: Circle system utilized Preoxygenation: Pre-oxygenation with 100% oxygen Induction Type: IV induction Ventilation: Mask ventilation without difficulty Laryngoscope Size: Mac and 3 Grade View: Grade I Tube type: Oral Number of attempts: 1 Airway Equipment and Method: Stylet Placement Confirmation: ETT inserted through vocal cords under direct vision, positive ETCO2 and breath sounds checked- equal and bilateral Secured at: 21 cm Tube secured with: Tape Dental Injury: Teeth and Oropharynx as per pre-operative assessment

## 2021-09-28 NOTE — Transfer of Care (Signed)
Immediate Anesthesia Transfer of Care Note  Patient: Patricia [name]  Procedure(s) Performed: LAPAROSCOPIC BILATERAL SALPINGECTOMY (Bilateral: Abdomen)  Patient Location: PACU  Anesthesia Type:General  Level of Consciousness: awake and alert   Airway & Oxygen Therapy: Patient Spontanous Breathing and Patient connected to nasal cannula oxygen  Post-op Assessment: Report given to RN and Post -op Vital signs reviewed and stable  Post vital signs: Reviewed and stable  Last Vitals:  Vitals Value Taken Time  BP 110/61   Temp 98.2   Pulse 100   Resp 12 09/28/21 1259  SpO2 98%   Vitals shown include unvalidated device data.  Last Pain:  Vitals:   09/28/21 1020  TempSrc: Oral  PainSc: 0-No pain         Complications: No notable events documented.

## 2021-09-28 NOTE — H&P (Signed)
Preoperative History and Physical  Patricia [name] is a 30 y.o. [phone] with Patient's last menstrual period was 09/10/2021 (exact date). admitted for a laparoscopic bilateral salpingectomy for permanent sterilization, she is s/p partioal salpingectomy postpartum previously with a procedure failure.    PMH:    Past Medical History:  Diagnosis Date   Anemia    Anxiety    Bacterial vaginosis    Chlamydia    Depression    PP depression after 3rd pregnancy - resolved with therarpy   Migraine    Miscarriage 2019   Preterm labor    Preterm labor without delivery in second trimester June 2012   Positive FFN in June   UTI (lower urinary tract infection)     PSH:     Past Surgical History:  Procedure Laterality Date   CHOLECYSTECTOMY N/A 10/22/2020   Procedure: LAPAROSCOPIC CHOLECYSTECTOMY;  Surgeon: Virl Cagey, MD;  Location: [facility];  Service: General;  Laterality: N/A;   TUBAL LIGATION N/A 09/15/2019   Procedure: POST PARTUM TUBAL LIGATION;  Surgeon: Florian Buff, MD;  Location: [facility];  Service: Gynecology;  Laterality: N/A;    POb/GynH:      OB History     Gravida  6   Para  5   Term  4   Preterm  1   AB  1   Living  5      SAB  1   IAB      Ectopic      Multiple  0   Live Births  5           SH:   Social History   Tobacco Use   Smoking status: Never   Smokeless tobacco: Never  Vaping Use   Vaping Use: Never used  Substance Use Topics   Alcohol use: Yes    Comment: occasion   Drug use: No    FH:    Family History  Problem Relation Age of Onset   Diabetes Other        great granmother   Diabetes Paternal Grandfather    Hypertension Paternal Grandfather    Cancer Paternal Grandfather        prostate   Miscarriages / Stillbirths Maternal Grandmother      Allergies: No Known Allergies  Medications:       Current Facility-Administered Medications:    bupivacaine liposome (EXPAREL) 1.3 % injection 266 mg, 20 mL,  Infiltration, Once, Mamie Diiorio, Mertie Clause, MD   ceFAZolin (ANCEF) 3-0.9 GM/100ML-% IVPB, , , ,    ceFAZolin (ANCEF) IVPB 3g/100 mL premix, 3 g, Intravenous, On Call to OR, Florian Buff, MD   chlorhexidine (PERIDEX) 0.12 % solution, , , ,    povidone-iodine 10 % swab 2 application, 2 application, Topical, Once, Marielouise Amey, Mertie Clause, MD  [name]:   [name]  Constitutional: Negative for fever, chills, weight loss, malaise/fatigue and diaphoresis.  HENT: Negative for hearing loss, ear pain, nosebleeds, congestion, sore throat, neck pain, tinnitus and ear discharge.   Eyes: Negative for blurred vision, double vision, photophobia, pain, discharge and redness.  Respiratory: Negative for cough, hemoptysis, sputum production, shortness of breath, wheezing and stridor.   Cardiovascular: Negative for chest pain, palpitations, orthopnea, claudication, leg swelling and PND.  Gastrointestinal: Positive for abdominal pain. Negative for heartburn, nausea, vomiting, diarrhea, constipation, blood in stool and melena.  Genitourinary: Negative for dysuria, urgency, frequency, hematuria and flank pain.  Musculoskeletal: Negative for myalgias, back pain,  joint pain and falls.  Skin: Negative for itching and rash.  Neurological: Negative for dizziness, tingling, tremors, sensory change, speech change, focal weakness, seizures, loss of consciousness, weakness and headaches.  Endo/Heme/Allergies: Negative for environmental allergies and polydipsia. Does not bruise/bleed easily.  Psychiatric/Behavioral: Negative for depression, suicidal ideas, hallucinations, memory loss and substance abuse. The patient is not nervous/anxious and does not have insomnia.      PHYSICAL EXAM:  Blood pressure 105/64, pulse 86, temperature 98 F (36.7 C), temperature source Oral, resp. rate 18, last menstrual period 09/10/2021, SpO2 98 %, not currently breastfeeding.    Vitals reviewed. Constitutional: She is oriented to  person, place, and time. She appears well-developed and well-nourished.  HENT:  Head: Normocephalic and atraumatic.  Right Ear: External ear normal.  Left Ear: External ear normal.  Nose: Nose normal.  Mouth/Throat: Oropharynx is clear and moist.  Eyes: Conjunctivae and EOM are normal. Pupils are equal, round, and reactive to light. Right eye exhibits no discharge. Left eye exhibits no discharge. No scleral icterus.  Neck: Normal range of motion. Neck supple. No tracheal deviation present. No thyromegaly present.  Cardiovascular: Normal rate, regular rhythm, normal heart sounds and intact distal pulses.  Exam reveals no gallop and no friction rub.   No murmur heard. Respiratory: Effort normal and breath sounds normal. No respiratory distress. She has no wheezes. She has no rales. She exhibits no tenderness.  GI: Soft. Bowel sounds are normal. She exhibits no distension and no mass. There is tenderness. There is no rebound and no guarding.  Genitourinary:       Vulva is normal without lesions Vagina is pink moist without discharge Cervix normal in appearance and pap is normal Uterus is normal size, contour, position, consistency, mobility, non-tender Adnexa is negative with normal sized ovaries by sonogram  Musculoskeletal: Normal range of motion. She exhibits no edema and no tenderness.  Neurological: She is alert and oriented to person, place, and time. She has normal reflexes. She displays normal reflexes. No cranial nerve deficit. She exhibits normal muscle tone. Coordination normal.  Skin: Skin is warm and dry. No rash noted. No erythema. No pallor.  Psychiatric: She has a normal mood and affect. Her behavior is normal. Judgment and thought content normal.    Labs: Results for orders placed or performed during the hospital encounter of 09/26/21 (from the past 336 hour(s))  CBC   Collection Time: 09/26/21 10:09 AM  Result Value Ref Range   WBC 5.3 4.0 - 10.5 K/uL   RBC 4.22 3.87 -  5.11 MIL/uL   Hemoglobin 11.4 (L) 12.0 - 15.0 g/dL   HCT 93.9 (L) 03.0 - 09.2 %   MCV 84.6 80.0 - 100.0 fL   MCH 27.0 26.0 - 34.0 pg   MCHC 31.9 30.0 - 36.0 g/dL   RDW 33.0 07.6 - 22.6 %   Platelets 227 150 - 400 K/uL   nRBC 0.0 0.0 - 0.2 %  Comprehensive metabolic panel   Collection Time: 09/26/21 10:09 AM  Result Value Ref Range   Sodium 136 135 - 145 mmol/L   Potassium 3.8 3.5 - 5.1 mmol/L   Chloride 104 98 - 111 mmol/L   CO2 26 22 - 32 mmol/L   Glucose, Bld 88 70 - 99 mg/dL   BUN 15 6 - 20 mg/dL   Creatinine, Ser 3.33 0.44 - 1.00 mg/dL   Calcium 8.8 (L) 8.9 - 10.3 mg/dL   Total Protein 7.1 6.5 - 8.1 g/dL   Albumin  3.7 3.5 - 5.0 g/dL   AST 21 15 - 41 U/L   ALT 30 0 - 44 U/L   Alkaline Phosphatase 49 38 - 126 U/L   Total Bilirubin 0.8 0.3 - 1.2 mg/dL   GFR, Estimated >60 >60 mL/min   Anion gap 6 5 - 15  hCG, quantitative, pregnancy   Collection Time: 09/26/21 10:09 AM  Result Value Ref Range   hCG, Beta Chain, Quant, S <1 <5 mIU/mL  Urinalysis, Routine w reflex microscopic Urine, Clean Catch   Collection Time: 09/26/21 10:09 AM  Result Value Ref Range   Color, Urine YELLOW YELLOW   APPearance CLEAR CLEAR   Specific Gravity, Urine 1.023 1.005 - 1.030   pH 5.0 5.0 - 8.0   Glucose, UA NEGATIVE NEGATIVE mg/dL   Hgb urine dipstick NEGATIVE NEGATIVE   Bilirubin Urine NEGATIVE NEGATIVE   Ketones, ur NEGATIVE NEGATIVE mg/dL   Protein, ur NEGATIVE NEGATIVE mg/dL   Nitrite NEGATIVE NEGATIVE   Leukocytes,Ua NEGATIVE NEGATIVE    EKG: Orders placed or performed during the hospital encounter of 10/25/20   ED EKG   ED EKG   EKG 12-Lead   EKG 12-Lead   EKG    Imaging Studies: No results found.    Assessment: Multiparous female desires permanent sterilization S/P postpartum tubal failure Plan: Laparoscopic bilateral salpingectomy for sterilization  Florian Buff 09/28/2021 11:44 AM

## 2021-09-29 ENCOUNTER — Encounter (HOSPITAL_COMMUNITY): Payer: Self-pay | Admitting: Obstetrics & Gynecology

## 2021-09-29 LAB — SURGICAL PATHOLOGY

## 2021-10-04 ENCOUNTER — Encounter: Payer: BC Managed Care – PPO | Admitting: Obstetrics & Gynecology

## 2021-10-13 ENCOUNTER — Other Ambulatory Visit: Payer: Self-pay

## 2021-10-13 ENCOUNTER — Ambulatory Visit (INDEPENDENT_AMBULATORY_CARE_PROVIDER_SITE_OTHER): Payer: BC Managed Care – PPO | Admitting: Obstetrics & Gynecology

## 2021-10-13 ENCOUNTER — Encounter: Payer: Self-pay | Admitting: Obstetrics & Gynecology

## 2021-10-13 VITALS — BP 150/88 | HR 82 | Ht 67.0 in | Wt 324.0 lb

## 2021-10-13 DIAGNOSIS — Z9889 Other specified postprocedural states: Secondary | ICD-10-CM | POA: Diagnosis not present

## 2021-10-13 NOTE — Progress Notes (Signed)
  HPI: Patient returns for routine postoperative follow-up having undergone laparoscopic bilateral salpingectomy on 09/28/21.  The patient's immediate postoperative recovery has been unremarkable. Since hospital discharge the patient reports no problems.   Current Outpatient Medications: ibuprofen (ADVIL) 600 MG tablet, Take 1 tablet (600 mg total) by mouth every 6 (six) hours., Disp: 30 tablet, Rfl: 0 phentermine (ADIPEX-P) 37.5 MG tablet, Take 18.75 mg by mouth 2 (two) times daily., Disp: , Rfl:  sertraline (ZOLOFT) 100 MG tablet, Take 100 mg by mouth daily., Disp: , Rfl:   No current facility-administered medications for this visit.    Blood pressure (!) 150/88, pulse 82, height 5\' 7"  (1.702 m), weight (!) 324 lb (147 kg), last menstrual period 10/10/2021, not currently breastfeeding.  Physical Exam: 3 incisions all well healed Abdomen is benign  Diagnostic Tests:   Pathology: normal  Impression:   ICD-10-CM   1. S/P Laparoscopic bilateral salpingectomy 09/28/21  Z98.890         Plan:     Follow up: Return if symptoms worsen or fail to improve.    13/2/22, MD

## 2021-12-21 ENCOUNTER — Other Ambulatory Visit: Payer: Self-pay

## 2021-12-21 ENCOUNTER — Encounter (HOSPITAL_COMMUNITY): Payer: Self-pay

## 2021-12-21 ENCOUNTER — Emergency Department (HOSPITAL_COMMUNITY)
Admission: EM | Admit: 2021-12-21 | Discharge: 2021-12-22 | Disposition: A | Discharge status: Home / Self Care | Payer: Medicaid Other | Attending: Emergency Medicine | Admitting: Emergency Medicine

## 2021-12-21 DIAGNOSIS — K0889 Other specified disorders of teeth and supporting structures: Secondary | ICD-10-CM | POA: Diagnosis not present

## 2021-12-21 MED ORDER — OXYCODONE-ACETAMINOPHEN 5-325 MG PO TABS
1.0000 | ORAL_TABLET | Freq: Once | ORAL | Status: AC
Start: 2021-12-21 — End: 2021-12-21
  Administered 2021-12-21: 1 via ORAL
  Filled 2021-12-21: qty 1

## 2021-12-21 MED ORDER — KETOROLAC TROMETHAMINE 30 MG/ML IJ SOLN
30.0000 mg | Freq: Once | INTRAMUSCULAR | Status: AC
Start: 2021-12-21 — End: 2021-12-21
  Administered 2021-12-21: 30 mg via INTRAMUSCULAR
  Filled 2021-12-21: qty 1

## 2021-12-21 NOTE — ED Notes (Signed)
Pt given warm blanket and heating pad.

## 2021-12-21 NOTE — ED Triage Notes (Signed)
Patient states wisdom tooth removal yesterday from bottom  left and states increase pain today with ringing in ear. Patient states prescribed hydrocodone and is not helping.

## 2021-12-22 MED ORDER — IBUPROFEN 600 MG PO TABS: 600.0000 mg | ORAL_TABLET | Freq: Four times a day (QID) | ORAL | 0 refills | Status: AC | PRN

## 2021-12-22 NOTE — ED Provider Notes (Signed)
[facility] EMERGENCY DEPARTMENT Provider Note   CSN: [id number] Arrival date & time: 12/21/21  2201     History  Chief Complaint  Patient presents with   Dental Pain    Patricia [name] is a 31 y.o. female.  HPI    This is a 31 year old female who presents with mouth pain.  Patient had wisdom tooth extraction yesterday on the left lower jaw.  She has been taking hydrocodone but has had escalating pain at home.  She states she has taken 3 without any relief.  She has not noted any difficulty swallowing.  No fevers.  Has not noted any bleeding at the site.   Home Medications Prior to Admission medications   Medication Sig Start Date End Date Taking? Authorizing Provider  ibuprofen (ADVIL) 600 MG tablet Take 1 tablet (600 mg total) by mouth every 6 (six) hours as needed. 12/22/21  Yes Bannon Giammarco, Mayer Masker, MD  ibuprofen (ADVIL) 600 MG tablet Take 1 tablet (600 mg total) by mouth every 6 (six) hours. 07/29/21   Cresenzo-Dishmon, Scarlette Calico, CNM  phentermine (ADIPEX-P) 37.5 MG tablet Take 18.75 mg by mouth 2 (two) times daily. 08/22/21   [provider]  sertraline (ZOLOFT) 100 MG tablet Take 100 mg by mouth daily. 08/22/21   [provider]      Allergies    Patient has no known allergies.    [name]   [name]  Constitutional:  Negative for fever.  HENT:  Positive for mouth sores.   All other systems reviewed and are negative.  Physical Exam Updated Vital Signs BP (!) 153/90    Pulse 85    Temp 98 F (36.7 C) (Oral)    Resp 18    Ht 1.702 m (5\' 7" )    Wt (!) 149.2 kg    SpO2 98%    BMI 51.53 kg/m  Physical Exam Vitals and nursing note reviewed.  Constitutional:      Appearance: She is well-developed. She is obese.  HENT:     Head: Normocephalic and atraumatic.     Mouth/Throat:     Mouth: Mucous membranes are moist.     Comments: No trismus, bed of tooth extraction clean and dry, no active bleeding, slight swelling noted of the left side  of the face, no fullness under the tongue Eyes:     Pupils: Pupils are equal, round, and reactive to light.  Cardiovascular:     Rate and Rhythm: Normal rate and regular rhythm.  Pulmonary:     Effort: Pulmonary effort is normal. No respiratory distress.  Abdominal:     Palpations: Abdomen is soft.  Musculoskeletal:     Cervical back: Neck supple.  Skin:    General: Skin is warm and dry.  Neurological:     Mental Status: She is alert and oriented to person, place, and time.  Psychiatric:        Mood and Affect: Mood normal.    ED Results / Procedures / Treatments   Labs (all labs ordered are listed, but only abnormal results are displayed) Labs Reviewed - No data to display  EKG None  Radiology No results found.  Procedures Procedures    Medications Ordered in ED Medications  ketorolac (TORADOL) 30 MG/ML injection 30 mg (30 mg Intramuscular Given 12/21/21 2340)  oxyCODONE-acetaminophen (PERCOCET/ROXICET) 5-325 MG per tablet 1 tablet (1 tablet Oral Given 12/21/21 2339)    ED Course/ Medical Decision Making/ A&P  Medical Decision Making Risk Prescription drug management.   This patient presents to the ED for concern of mouth pain, this involves an extensive number of treatment options, and is a complaint that carries with it a high risk of complications and morbidity.  The differential diagnosis includes normal postoperative pain, infection  MDM:    This is a 31 year old female who presents with dental pain following wisdom tooth extraction.  She is nontoxic and vital signs are reassuring.  Her physical exam is largely reassuring.  She has very mild left-sided facial swelling which is likely normal following this procedure.  The bed of the extraction is dry.  No indication of deep space infection such as Ludwigs.  Do not feel she needs CT imaging although this was considered.  She is not taking any anti-inflammatory medications at this time.   She was given Toradol and Percocet.  Recommend addition of ibuprofen 600 mg every 6 hours.  Follow-up with oral surgery (Labs, imaging)  Labs: I Ordered, and personally interpreted labs.  The pertinent results include: None  Imaging Studies ordered: I ordered imaging studies including none I independently visualized and interpreted imaging. I agree with the radiologist interpretation  Additional history obtained from patient.  External records from outside source obtained and reviewed including prior records  Critical Interventions: IM Toradol  Consultations: I requested consultation with the none,  and discussed lab and imaging findings as well as pertinent plan - they recommend: None  Cardiac Monitoring: The patient was maintained on a cardiac monitor.  I personally viewed and interpreted the cardiac monitored which showed an underlying rhythm of: N/A  Reevaluation: After the interventions noted above, I reevaluated the patient and found that they have :improved   Considered admission for: N/A  Social Determinants of Health: Lives independently  Disposition: Discharge with oral surgery follow-up  Co morbidities that complicate the patient evaluation  Past Medical History:  Diagnosis Date   Anemia    Anxiety    Bacterial vaginosis    Chlamydia    Depression    PP depression after 3rd pregnancy - resolved with therarpy   Migraine    Miscarriage 2019   Preterm labor    Preterm labor without delivery in second trimester June 2012   Positive FFN in June   UTI (lower urinary tract infection)      Medicines Meds ordered this encounter  Medications   ketorolac (TORADOL) 30 MG/ML injection 30 mg   oxyCODONE-acetaminophen (PERCOCET/ROXICET) 5-325 MG per tablet 1 tablet   ibuprofen (ADVIL) 600 MG tablet    Sig: Take 1 tablet (600 mg total) by mouth every 6 (six) hours as needed.    Dispense:  30 tablet    Refill:  0    I have reviewed the patients home medicines  and have made adjustments as needed  Problem List / ED Course: Problem List Items Addressed This Visit   None Visit Diagnoses     Pain, dental    -  Primary                   Final Clinical Impression(s) / ED Diagnoses Final diagnoses:  Pain, dental    Rx / DC Orders ED Discharge Orders          Ordered    ibuprofen (ADVIL) 600 MG tablet  Every 6 hours PRN        12/22/21 0000              Mckyla Deckman, Toni Amendourtney  F, MD 12/22/21 4818

## 2021-12-22 NOTE — Discharge Instructions (Addendum)
You were seen today for dental pain.  Follow-up with your oral surgeon in the morning if not improving.  You may add ibuprofen 600 mg to your pain regimen.  If you develop difficulty swallowing or fevers, you should be reevaluated immediately.

## 2021-12-23 ENCOUNTER — Telehealth: Payer: Self-pay

## 2021-12-23 NOTE — Telephone Encounter (Signed)
Transition Care Management Unsuccessful Follow-up Telephone Call  Date of discharge and from where:  12/22/2021-[place]   Attempts:  1st Attempt  Reason for unsuccessful TCM follow-up call:  Unable to reach patient

## 2021-12-26 NOTE — Telephone Encounter (Signed)
Transition Care Management Follow-up Telephone Call Date of discharge and from where: 12/22/2021-[place]  How have you been since you were released from the hospital? Pt stated she still has a little pain that has gotten better. She will follow up with PCP as needed.  Any questions or concerns? No  Items Reviewed: Did the pt receive and understand the discharge instructions provided? Yes  Medications obtained and verified? Yes  Other? No  Any new allergies since your discharge? No  Dietary orders reviewed? No Do you have support at home? Yes   Home Care and Equipment/Supplies: Were home health services ordered? not applicable If so, what is the name of the agency? N/A  Has the agency set up a time to come to the patient's home? not applicable Were any new equipment or medical supplies ordered?  No What is the name of the medical supply agency? N/A Were you able to get the supplies/equipment? not applicable Do you have any questions related to the use of the equipment or supplies? No  Functional Questionnaire: (I = Independent and D = Dependent) ADLs: I  Bathing/Dressing- I  Meal Prep- I  Eating- I  Maintaining continence- I  Transferring/Ambulation- I  Managing Meds- I  Follow up appointments reviewed:  PCP Hospital f/u appt confirmed? No   Specialist Hospital f/u appt confirmed? No   Are transportation arrangements needed? No  If their condition worsens, is the pt aware to call PCP or go to the Emergency Dept.? Yes Was the patient provided with contact information for the PCP's office or ED? Yes Was to pt encouraged to call back with questions or concerns? Yes

## 2022-01-09 ENCOUNTER — Other Ambulatory Visit: Payer: Self-pay

## 2022-01-09 ENCOUNTER — Ambulatory Visit
Admission: EM | Admit: 2022-01-09 | Discharge: 2022-01-09 | Disposition: A | Discharge status: Home / Self Care | Payer: Medicaid Other | Attending: Urgent Care | Admitting: Urgent Care

## 2022-01-09 DIAGNOSIS — M25512 Pain in left shoulder: Secondary | ICD-10-CM | POA: Diagnosis not present

## 2022-01-09 MED ORDER — NAPROXEN 500 MG PO TABS
500.0000 mg | ORAL_TABLET | Freq: Two times a day (BID) | ORAL | 0 refills | Status: AC
Start: 2022-01-09 — End: ?

## 2022-01-09 MED ORDER — KETOROLAC TROMETHAMINE 60 MG/2ML IM SOLN
60.0000 mg | Freq: Once | INTRAMUSCULAR | Status: AC
  Administered 2022-01-09: 60 mg via INTRAMUSCULAR

## 2022-01-09 MED ORDER — TIZANIDINE HCL 4 MG PO TABS: 4.0000 mg | ORAL_TABLET | Freq: Every day | ORAL | 0 refills | Status: AC

## 2022-01-09 NOTE — ED Triage Notes (Signed)
Pt reports left axillar pain that moves to left shoulder and left arm  around 16:30 today. Denies any injury.

## 2022-01-09 NOTE — ED Provider Notes (Signed)
[facility]   MRN: [id number] DOB: 01/12/1991  Subjective:   Patricia [name] is a 31 y.o. female presenting for sudden onset of severe 9 out of 10 left shoulder pain.  Patient was walking when she felt sudden sharp pain internally in her shoulder.  No fall, trauma, bruising, swelling.  No history of musculoskeletal disorders.  Denies hearing or feeling any kind of pop.  Did not sleep from as the symptoms started while after she was awake.  Does not do any particular heavy lifting with work.  No current facility-administered medications for this encounter.  Current Outpatient Medications:    ibuprofen (ADVIL) 600 MG tablet, Take 1 tablet (600 mg total) by mouth every 6 (six) hours., Disp: 30 tablet, Rfl: 0   ibuprofen (ADVIL) 600 MG tablet, Take 1 tablet (600 mg total) by mouth every 6 (six) hours as needed., Disp: 30 tablet, Rfl: 0   phentermine (ADIPEX-P) 37.5 MG tablet, Take 18.75 mg by mouth 2 (two) times daily., Disp: , Rfl:    sertraline (ZOLOFT) 100 MG tablet, Take 100 mg by mouth daily., Disp: , Rfl:    No Known Allergies  Past Medical History:  Diagnosis Date   Anemia    Anxiety    Bacterial vaginosis    Chlamydia    Depression    PP depression after 3rd pregnancy - resolved with therarpy   Migraine    Miscarriage 2019   Preterm labor    Preterm labor without delivery in second trimester June 2012   Positive FFN in June   UTI (lower urinary tract infection)      Past Surgical History:  Procedure Laterality Date   CHOLECYSTECTOMY N/A 10/22/2020   Procedure: LAPAROSCOPIC CHOLECYSTECTOMY;  Surgeon: Lucretia Roers, MD;  Location: [facility];  Service: General;  Laterality: N/A;   LAPAROSCOPIC BILATERAL SALPINGECTOMY Bilateral 09/28/2021   Procedure: LAPAROSCOPIC BILATERAL SALPINGECTOMY;  Surgeon: Lazaro Arms, MD;  Location: [facility];  Service: Gynecology;  Laterality: Bilateral;   TUBAL LIGATION N/A 09/15/2019   Procedure: POST PARTUM TUBAL LIGATION;   Surgeon: Lazaro Arms, MD;  Location: [facility];  Service: Gynecology;  Laterality: N/A;    Family History  Problem Relation Age of Onset   Diabetes Other        great granmother   Diabetes Paternal Grandfather    Hypertension Paternal Grandfather    Cancer Paternal Grandfather        prostate   Miscarriages / Stillbirths Maternal Grandmother     Social History   Tobacco Use   Smoking status: Never   Smokeless tobacco: Never  Vaping Use   Vaping Use: Never used  Substance Use Topics   Alcohol use: Yes    Comment: occasion   Drug use: No    ROS   Objective:   Vitals: BP 124/77 (BP Location: Right Wrist)    Pulse 74    Temp 98.6 F (37 C) (Oral)    Resp 20    LMP  (Within Weeks) Comment: 1 week   SpO2 96%    Breastfeeding No   Physical Exam Constitutional:      General: She is not in acute distress.    Appearance: Normal appearance. She is well-developed. She is obese. She is not ill-appearing, toxic-appearing or diaphoretic.  HENT:     Head: Normocephalic and atraumatic.     Nose: Nose normal.     Mouth/Throat:     Mouth: Mucous membranes are moist.  Eyes:  General: No scleral icterus.       Right eye: No discharge.        Left eye: No discharge.     Extraocular Movements: Extraocular movements intact.  Cardiovascular:     Rate and Rhythm: Normal rate.  Pulmonary:     Effort: Pulmonary effort is normal.  Musculoskeletal:     Left shoulder: Tenderness present. No swelling, deformity, effusion, laceration, bony tenderness or crepitus. Decreased range of motion. Normal strength.     Comments: Patient guarding extensively, refused any kind of range of motion active or passive as it elicited severe pain.  Counseled patient on shoulder rehab.  Shoulder  Skin:    General: Skin is warm and dry.  Neurological:     General: No focal deficit present.     Mental Status: She is alert and oriented to person, place, and time.  Psychiatric:        Mood and Affect:  Mood normal.        Behavior: Behavior normal.        Thought Content: Thought content normal.        Judgment: Judgment normal.   IM Toradol and 60 mg in clinic.   Assessment and Plan :   PDMP not reviewed this encounter.  1. Acute pain of left shoulder    Pain out of proportion to HPI or physical exam findings.  Deferred imaging as there is a lack of trauma.  Offered her IM Toradol for her severe pain.  Use naproxen for pain and inflammation and tizanidine as a muscle relaxant. Counseled patient on rehab and care.  Potential for adverse effects with medications prescribed/recommended today, ER and return-to-clinic precautions discussed, patient verbalized understanding.    Wallis Bamberg, [place] 01/09/22 2956

## 2022-01-15 IMAGING — DX DG CHEST 2V
2 series · 2 of 2 positions shown · non-contrast
Comparison: Chest radiograph October 21, 2020.

CLINICAL DATA: Chest pain status post gallbladder surgery on [REDACTED]

EXAM:
CHEST - 2 VIEW

[chest pa]
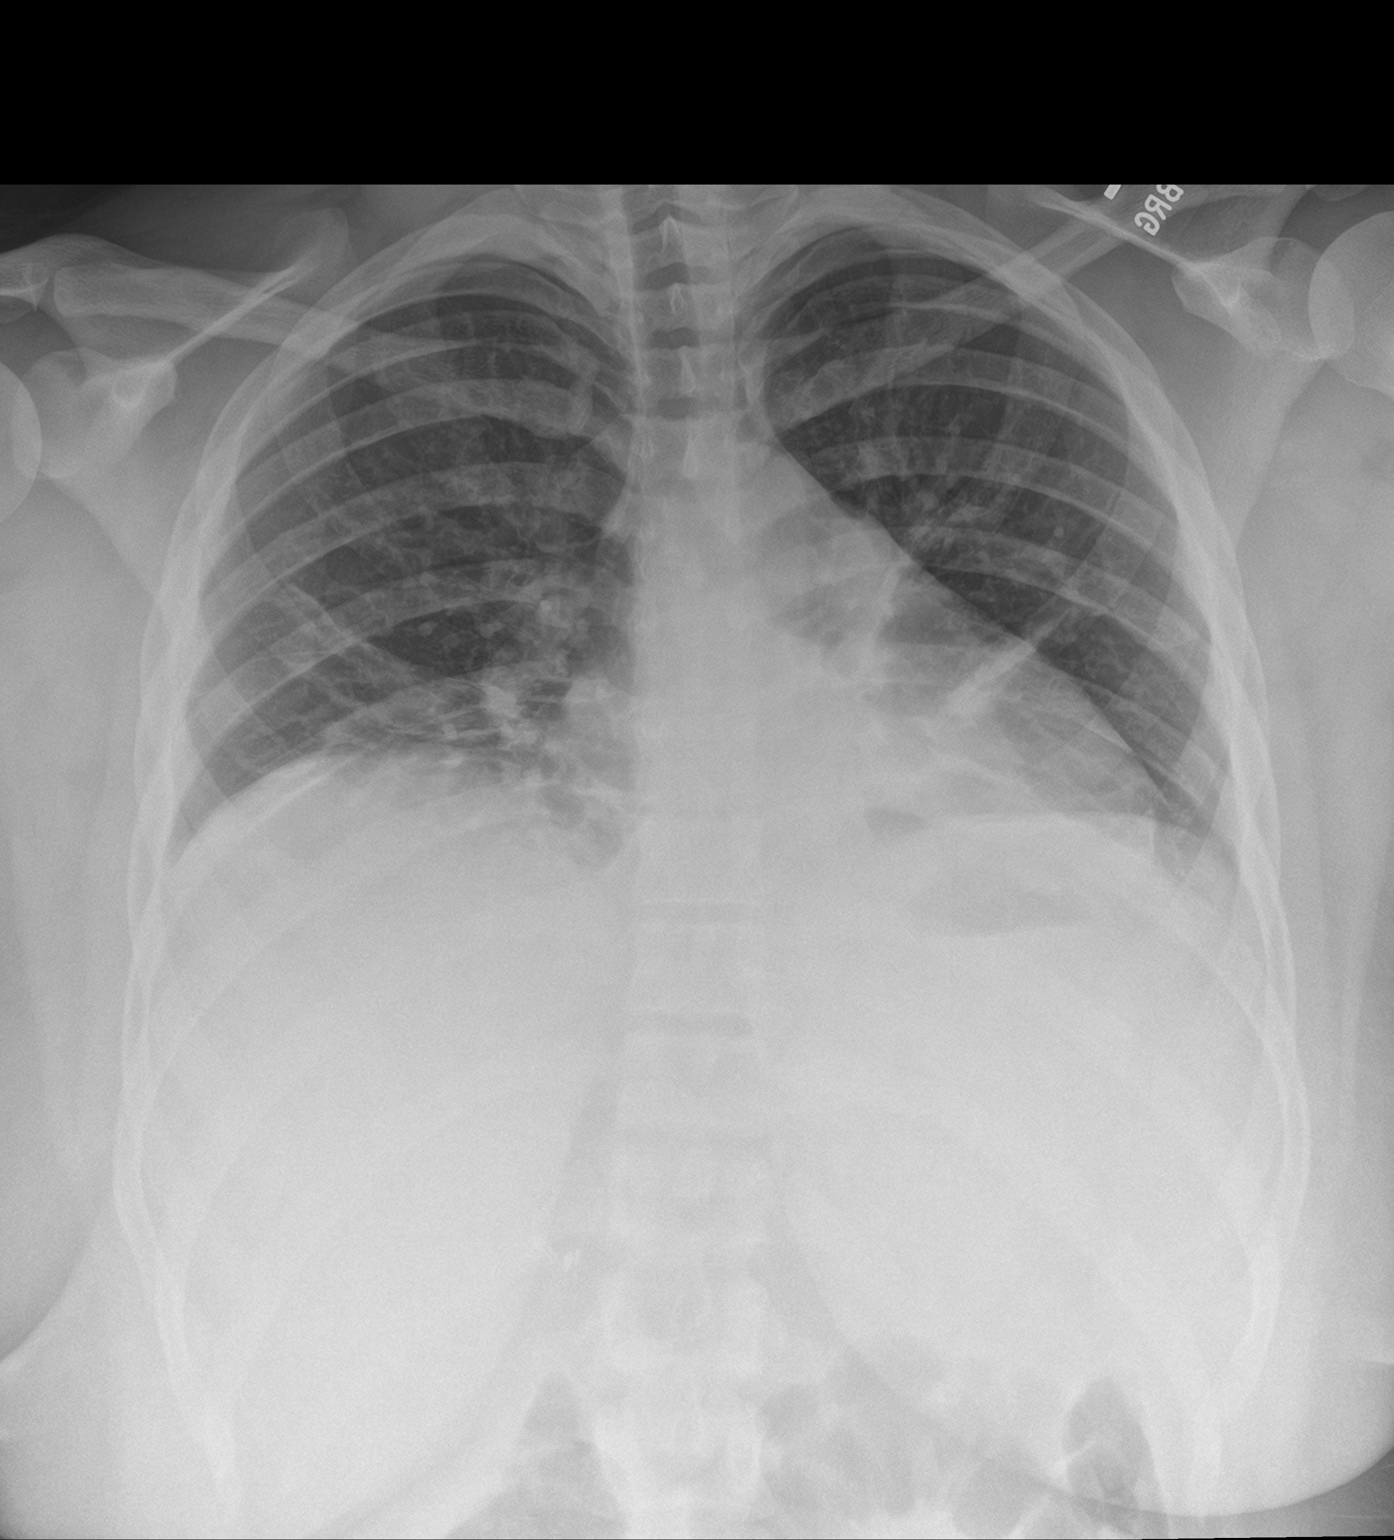

[chest lat]
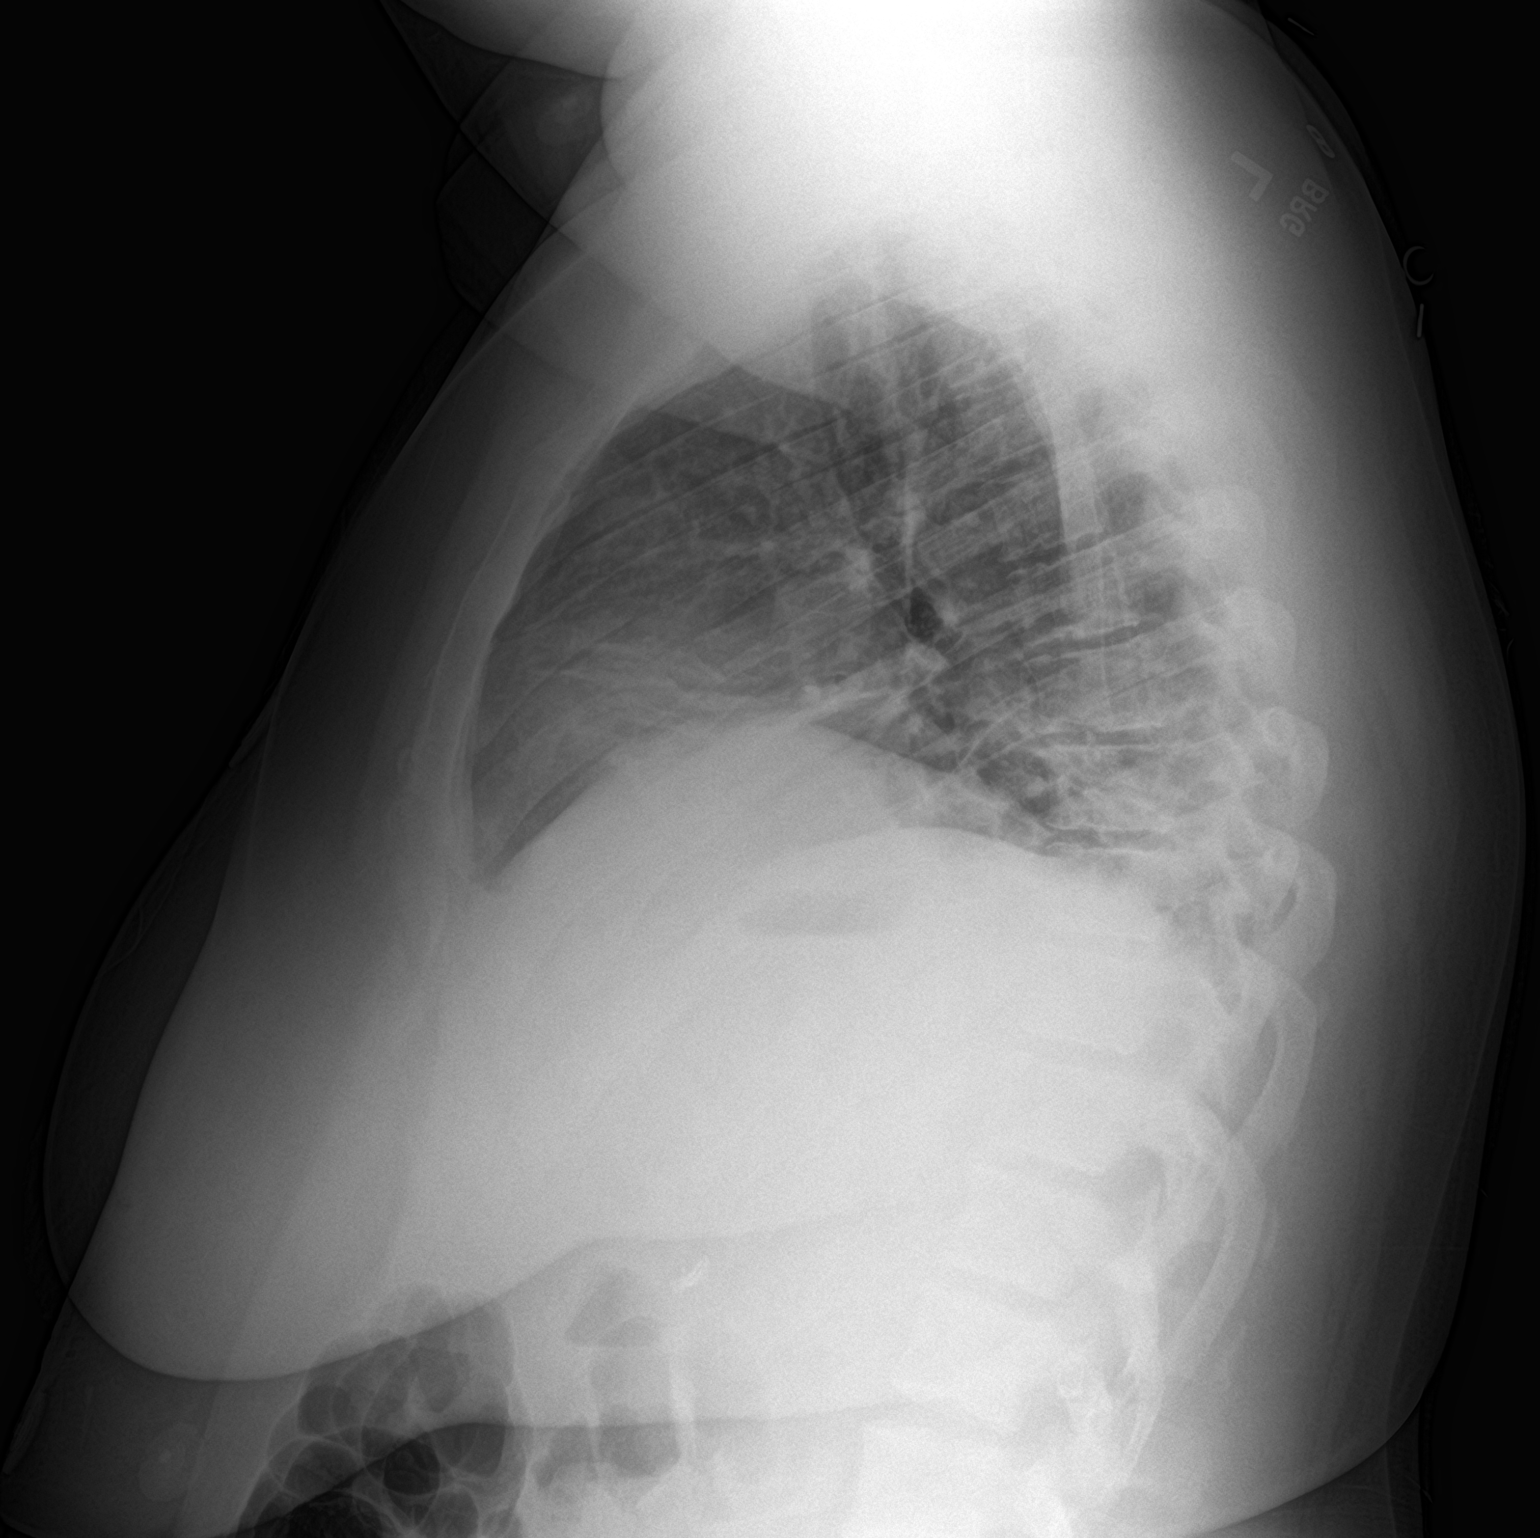

[2 of 2 positions shown; findings below may reference images not displayed]

FINDINGS: The heart size and mediastinal contours are within normal limits.
Low lung volumes with hypoventilatory change in the dependent lungs.
No visible pneumothorax or significant pleural effusion.
Cholecystectomy clips.
IMPRESSION: Low lung volumes with bilateral lower lobe and right middle lobe
atelectasis, superimposed infection cannot be exclude.

## 2022-01-16 ENCOUNTER — Other Ambulatory Visit: Payer: Self-pay

## 2022-01-16 ENCOUNTER — Other Ambulatory Visit (INDEPENDENT_AMBULATORY_CARE_PROVIDER_SITE_OTHER): Payer: Medicaid Other

## 2022-01-16 ENCOUNTER — Encounter: Payer: Self-pay | Admitting: Women's Health

## 2022-01-16 DIAGNOSIS — R399 Unspecified symptoms and signs involving the genitourinary system: Secondary | ICD-10-CM | POA: Diagnosis not present

## 2022-01-16 LAB — POCT URINALYSIS DIPSTICK OB
Glucose, UA: NEGATIVE
Ketones, UA: NEGATIVE
Nitrite, UA: NEGATIVE

## 2022-01-16 NOTE — Progress Notes (Signed)
° °  NURSE VISIT- UTI SYMPTOMS   SUBJECTIVE:  Patricia [name] is a 31 y.o. [phone] female here for UTI symptoms. She is postpartum. She reports cloudy urine and odor .  OBJECTIVE:  There were no vitals taken for this visit.  Appears well, in no apparent distress  Results for orders placed or performed in visit on 01/16/22 (from the past 24 hour(s))  POC Urinalysis Dipstick OB   Collection Time: 01/16/22 11:27 AM  Result Value Ref Range   Color, UA     Clarity, UA     Glucose, UA Negative Negative   Bilirubin, UA     Ketones, UA neg    Spec Grav, UA     Blood, UA small    pH, UA     POC,PROTEIN,UA Trace Negative, Trace, Small (1+), Moderate (2+), Large (3+), 4+   Urobilinogen, UA     Nitrite, UA neg    Leukocytes, UA Large (3+) (A) Negative   Appearance     Odor      ASSESSMENT: Postpartum with UTI symptoms and negative nitrites  PLAN: Discussed with Cyril Mourning, AGNP   Rx sent by provider today: No Urine culture sent Call or return to clinic prn if these symptoms worsen or fail to improve as anticipated. Follow-up: as needed   Aleicia Kenagy A Muhamed Luecke  01/16/2022 11:33 AM

## 2022-01-17 ENCOUNTER — Encounter: Payer: Self-pay | Admitting: Women's Health

## 2022-01-17 LAB — URINALYSIS, ROUTINE W REFLEX MICROSCOPIC
Bilirubin, UA: NEGATIVE
Glucose, UA: NEGATIVE
Ketones, UA: NEGATIVE
Nitrite, UA: NEGATIVE
Specific Gravity, UA: 1.02 (ref 1.005–1.030)
Urobilinogen, Ur: 1 mg/dL (ref 0.2–1.0)
pH, UA: 6 (ref 5.0–7.5)

## 2022-01-17 LAB — MICROSCOPIC EXAMINATION
Casts: NONE SEEN /lpf
WBC, UA: >30 /hpf — AB (ref 0–5)

## 2022-01-20 ENCOUNTER — Telehealth: Payer: Self-pay | Admitting: Adult Health

## 2022-01-20 LAB — URINE CULTURE

## 2022-01-20 MED ORDER — SULFAMETHOXAZOLE-TRIMETHOPRIM 800-160 MG PO TABS: 1.0000 | ORAL_TABLET | Freq: Two times a day (BID) | ORAL | 0 refills | Status: AC

## 2022-01-20 NOTE — Telephone Encounter (Signed)
Pt aware +Ecoli will rx septra ds

## 2022-03-10 ENCOUNTER — Other Ambulatory Visit: Payer: Self-pay

## 2022-03-10 ENCOUNTER — Emergency Department (HOSPITAL_COMMUNITY)
Admission: EM | Admit: 2022-03-10 | Discharge: 2022-03-10 | Disposition: A | Discharge status: Home / Self Care | Payer: Medicaid Other | Attending: Emergency Medicine | Admitting: Emergency Medicine

## 2022-03-10 ENCOUNTER — Encounter (HOSPITAL_COMMUNITY): Payer: Self-pay

## 2022-03-10 DIAGNOSIS — R000 Tachycardia, unspecified: Secondary | ICD-10-CM | POA: Diagnosis not present

## 2022-03-10 DIAGNOSIS — M791 Myalgia, unspecified site: Secondary | ICD-10-CM | POA: Diagnosis present

## 2022-03-10 DIAGNOSIS — Z20822 Contact with and (suspected) exposure to covid-19: Secondary | ICD-10-CM | POA: Diagnosis not present

## 2022-03-10 DIAGNOSIS — N12 Tubulo-interstitial nephritis, not specified as acute or chronic: Secondary | ICD-10-CM | POA: Diagnosis not present

## 2022-03-10 DIAGNOSIS — B349 Viral infection, unspecified: Secondary | ICD-10-CM

## 2022-03-10 LAB — RESP PANEL BY RT-PCR (FLU A&B, COVID) ARPGX2
Influenza A by PCR: NEGATIVE
Influenza B by PCR: NEGATIVE
SARS Coronavirus 2 by RT PCR: NEGATIVE

## 2022-03-10 MED ORDER — SODIUM CHLORIDE 0.9 % IV BOLUS
1000.0000 mL | Freq: Once | INTRAVENOUS | Status: AC
Start: 2022-03-10 — End: 2022-03-10
  Administered 2022-03-10: 1000 mL via INTRAVENOUS

## 2022-03-10 MED ORDER — ACETAMINOPHEN 500 MG PO TABS
1000.0000 mg | ORAL_TABLET | Freq: Once | ORAL | Status: AC
  Administered 2022-03-10: 1000 mg via ORAL
  Filled 2022-03-10: qty 2

## 2022-03-10 NOTE — ED Provider Notes (Signed)
?[facility]PART[facility] ?Provider Note ? ? ?CSN: [id number] ?Arrival date & time: 03/10/22  1849 ? ?  ? ?History ?Chief Complaint  ?Patient presents with  ? Generalized Body Aches  ? ? ?Patricia [name] is a 31 y.o. female who presents to the emergency department with a 1 day history of headache and generalized myalgias.  Patient denies any sick contacts.  She denies any abdominal pain, nausea, vomiting, diarrhea.  Denies any fever or chills.  No sore throat. ? ?HPI ? ?  ? ?Home Medications ?Prior to Admission medications   ?Medication Sig Start Date End Date Taking? Authorizing Provider  ?ibuprofen (ADVIL) 600 MG tablet Take 1 tablet (600 mg total) by mouth every 6 (six) hours. 07/29/21   Cresenzo-Dishmon, Scarlette Calico, CNM  ?ibuprofen (ADVIL) 600 MG tablet Take 1 tablet (600 mg total) by mouth every 6 (six) hours as needed. 12/22/21   Horton, Mayer Masker, MD  ?naproxen (NAPROSYN) 500 MG tablet Take 1 tablet (500 mg total) by mouth 2 (two) times daily with a meal. 01/09/22   Wallis Bamberg, PA-C  ?phentermine (ADIPEX-P) 37.5 MG tablet Take 18.75 mg by mouth 2 (two) times daily. 08/22/21   [provider]  ?sertraline (ZOLOFT) 100 MG tablet Take 100 mg by mouth daily. 08/22/21   [provider]  ?sulfamethoxazole-trimethoprim (BACTRIM DS) 800-160 MG tablet Take 1 tablet by mouth 2 (two) times daily. Take 1 bid 01/20/22   Adline Potter, NP  ?tiZANidine (ZANAFLEX) 4 MG tablet Take 1 tablet (4 mg total) by mouth at bedtime. 01/09/22   Wallis Bamberg, PA-C  ?   ? ?Allergies    ?Patient has no known allergies.   ? ?[name]   ?[name]  ?All other systems reviewed and are negative. ? ?Physical Exam ?Updated Vital Signs ?BP 135/79   Pulse (!) 127   Temp 99.5 ?F (37.5 ?C) (Oral)   Resp 18   Ht 5\' 7"  (1.702 m)   Wt (!) 149.7 kg   LMP 03/01/2022 (Exact Date)   SpO2 98%   BMI 51.69 kg/m?  ?Physical Exam ?Vitals and nursing note reviewed.  ?Constitutional:   ?   General: She is not in  acute distress. ?   Appearance: Normal appearance.  ?HENT:  ?   Head: Normocephalic and atraumatic.  ?Eyes:  ?   General:     ?   Right eye: No discharge.     ?   Left eye: No discharge.  ?Cardiovascular:  ?   Rate and Rhythm: Tachycardia present.  ?   Comments: S1/S2 are distinct without any evidence of murmur, rubs, or gallops.  Radial pulses are 2+ bilaterally.  Dorsalis pedis pulses are 2+ bilaterally.  No evidence of pedal edema. ?Pulmonary:  ?   Comments: Clear to auscultation bilaterally.  Normal effort.  No respiratory distress.  No evidence of wheezes, rales, or rhonchi heard throughout. ?Abdominal:  ?   General: Abdomen is flat. Bowel sounds are normal. There is no distension.  ?   Tenderness: There is no abdominal tenderness. There is no guarding or rebound.  ?Musculoskeletal:     ?   General: Normal range of motion.  ?   Cervical back: Neck supple.  ?Skin: ?   General: Skin is warm and dry.  ?   Findings: No rash.  ?   Comments: Hot to touch  ?Neurological:  ?   General: No focal deficit present.  ?   Mental Status: She is  alert.  ?Psychiatric:     ?   Mood and Affect: Mood normal.     ?   Behavior: Behavior normal.  ? ? ?ED Results / Procedures / Treatments   ?Labs ?(all labs ordered are listed, but only abnormal results are displayed) ?Labs Reviewed  ?RESP PANEL BY RT-PCR (FLU A&B, COVID) ARPGX2  ? ? ?EKG ?None ? ?Radiology ?No results found. ? ?Procedures ?Procedures  ? ? ?Medications Ordered in ED ?Medications  ?acetaminophen (TYLENOL) tablet 1,000 mg (1,000 mg Oral Given 03/10/22 1923)  ?sodium chloride 0.9 % bolus 1,000 mL (0 mLs Intravenous Stopped 03/10/22 2243)  ? ? ?ED Course/ Medical Decision Making/ A&P ?  ?                        ?Medical Decision Making ?Risk ?OTC drugs. ? ? ?This patient presents to the ED for concern of general malaise and myalgias, this involves an extensive number of treatment options, and is a complaint that carries with it a high risk of complications and morbidity.   The differential diagnosis includes viral illness.  I have a low suspicion for pneumonia or strep throat at this time. ? ? ?Co morbidities that complicate the patient evaluation ? ?Past Medical History:  ?Diagnosis Date  ? Anemia   ? Anxiety   ? Bacterial vaginosis   ? Chlamydia   ? Depression   ? PP depression after 3rd pregnancy - resolved with therarpy  ? Migraine   ? Miscarriage 2019  ? Preterm labor   ? Preterm labor without delivery in second trimester June 2012  ? Positive FFN in June  ? UTI (lower urinary tract infection)   ? ? ?Additional history obtained: ? ?Additional history obtained from nursing note ? ?Lab Tests: ? ?I Ordered, and personally interpreted labs.  The pertinent results include: Respiratory panel ? ? ?Imaging Studies ordered: ? ?None ? ? ?Cardiac Monitoring: ? ?The patient was maintained on a cardiac monitor.  I personally viewed and interpreted the cardiac monitored which showed an underlying rhythm of: Normal sinus rhythm ? ? ?Medicines ordered and prescription drug management: ? ?I ordered medication including Tylenol for fever, fluids for tachycardia. ?Reevaluation of the patient after these medicines showed that the patient improved ?I have reviewed the patients home medicines and have made adjustments as needed ? ? ?Problem List / ED Course: ? ?Patient presents to the emergency department with general myalgias likely viral illness.  Patient is febrile up to proximately 103 and tachycardic.  We will give her Tylenol and fluids. ? ? ?Reevaluation: ? ?After the interventions noted above, I reevaluated the patient and found that they have :improved ? ? ?Social Determinants of Health: ? ?Social Determinants of Health with Concerns  ? ?Physical Activity: Insufficiently Active  ? Days of Exercise per Week: 2 days  ? Minutes of Exercise per Session: 30 min  ?Stress: Stress Concern Present  ? Feeling of Stress : Very much  ?Social Connections: Socially Isolated  ? Frequency of Communication  with Friends and Family: Once a week  ? Frequency of Social Gatherings with Friends and Family: Never  ? Attends Religious Services: 1 to 4 times per year  ? Active Member of Clubs or Organizations: No  ? Attends Banker[employer] or Organization Meetings: Never  ? Marital Status: Separated  ?Depression (PHQ2-9): Medium Risk  ? PHQ-2 Score: 14  ? ? ? ? ?Dispostion: ? ?After consideration of the diagnostic results and the patients  response to treatment, I feel that the patent would benefit from outpatient follow-up.  Patient will stay off work until she is afebrile.  I instructed her to alternate Tylenol or Motrin for fever.  I instructed her to drink plenty of fluids and get plenty of rest. ? ?Final Clinical Impression(s) / ED Diagnoses ?Final diagnoses:  ?Viral syndrome  ? ? ?Rx / DC Orders ?ED Discharge Orders   ? ? None  ? ?  ? ? ?  ?Honor Loh Bourg, PA-C ?03/10/22 2305 ? ?  ?Mancel Bale, MD ?03/11/22 1106 ? ?

## 2022-03-10 NOTE — Discharge Instructions (Signed)
Please drink plenty fluids and get plenty of rest.  Please alternate Tylenol and Motrin.  Please stay out of work until you are fever free for 24 hours.  Return to the emergency department for any worsening symptoms. ?

## 2022-03-10 NOTE — ED Triage Notes (Signed)
Pt c/o body aches since yesterday. No known sick contacts.  ?

## 2022-03-12 ENCOUNTER — Emergency Department (HOSPITAL_COMMUNITY)
Admission: EM | Admit: 2022-03-12 | Discharge: 2022-03-12 | Disposition: A | Discharge status: Home / Self Care | Payer: Medicaid Other | Attending: Emergency Medicine | Admitting: Emergency Medicine

## 2022-03-12 ENCOUNTER — Other Ambulatory Visit: Payer: Self-pay

## 2022-03-12 ENCOUNTER — Encounter (HOSPITAL_COMMUNITY): Payer: Self-pay

## 2022-03-12 DIAGNOSIS — B349 Viral infection, unspecified: Secondary | ICD-10-CM | POA: Insufficient documentation

## 2022-03-12 DIAGNOSIS — R509 Fever, unspecified: Secondary | ICD-10-CM | POA: Diagnosis not present

## 2022-03-12 MED ORDER — PROCHLORPERAZINE MALEATE 10 MG PO TABS
10.0000 mg | ORAL_TABLET | Freq: Once | ORAL | Status: AC
  Administered 2022-03-12: 10 mg via ORAL
  Filled 2022-03-12: qty 1

## 2022-03-12 MED ORDER — ONDANSETRON HCL 4 MG PO TABS: 4.0000 mg | ORAL_TABLET | Freq: Four times a day (QID) | ORAL | 0 refills | Status: AC

## 2022-03-12 MED ORDER — IBUPROFEN 400 MG PO TABS
600.0000 mg | ORAL_TABLET | Freq: Once | ORAL | Status: AC
Start: 2022-03-12 — End: 2022-03-12
  Administered 2022-03-12: 600 mg via ORAL
  Filled 2022-03-12: qty 2

## 2022-03-12 MED ORDER — HYDROMORPHONE HCL 1 MG/ML IJ SOLN
0.5000 mg | Freq: Once | INTRAMUSCULAR | Status: AC
Start: 2022-03-12 — End: 2022-03-12
  Administered 2022-03-12: 0.5 mg via INTRAMUSCULAR
  Filled 2022-03-12: qty 1

## 2022-03-12 MED ORDER — ACETAMINOPHEN 500 MG PO TABS
1000.0000 mg | ORAL_TABLET | Freq: Once | ORAL | Status: AC
  Administered 2022-03-12: 1000 mg via ORAL
  Filled 2022-03-12: qty 2

## 2022-03-12 MED ORDER — ONDANSETRON 4 MG PO TBDP
4.0000 mg | ORAL_TABLET | Freq: Once | ORAL | Status: AC
  Administered 2022-03-12: 4 mg via ORAL
  Filled 2022-03-12: qty 1

## 2022-03-12 NOTE — ED Triage Notes (Signed)
Patient complaining of fever, bodyaches, headache and chills since Thursday. Negative Covid test in this ED two days ago.  ?

## 2022-03-12 NOTE — Discharge Instructions (Addendum)
Likely a viral infection, recommend over-the-counter pain medications like ibuprofen Tylenol for fever and pain control, nasal decongestions like Flonase and Zyrtec, Mucinex for cough.  If not eating recommend supplementing with Gatorade to help with electrolyte supplementation.  Given you Zofran please use as needed for nausea.  Follow-up PCP for further evaluation.  Come back to the emergency department if you develop chest pain, shortness of breath, severe abdominal pain, uncontrolled nausea, vomiting, diarrhea.  

## 2022-03-12 NOTE — ED Notes (Signed)
ED Provider at bedside. 

## 2022-03-12 NOTE — ED Notes (Signed)
EDP made aware of pts temp 

## 2022-03-12 NOTE — ED Provider Notes (Signed)
?[facility] ?Provider Note ? ? ?CSN: [id number] ?Arrival date & time: 03/12/22  1030 ? ?  ? ?History ? ?Chief Complaint  ?Patient presents with  ? Fever  ? ? ?Patricia [name] is a 31 y.o. female. ? ?HPI ? ?Patient with medical history including obesity, migraines, anxiety, presents to the emergency room complaints of URI-like symptoms.  Patient states that symptoms started on Thursday, she endorses general body aches fevers chills as well as a headache.  She denies nasal congestion, and sore throat cough but endorses decreased appetite nausea without vomiting, no stomach pain constipation or diarrhea.  Denies any recent sick contacts, not immunocompromise, up-to-date on all childhood vaccines as well as COVID influenza.  She states that she came to the emergency department was assessed few days ago and was discharged home negative for COVID, she states she is back today because she is having worsening symptoms i.e. fevers intermittent fevers which she is controlling with ibuprofen Tylenol and a headache, headache on her temples bilaterally, headache is intermittent, no associated change in vision paresthesias or weakness in the upper or lower extremities, denies any neck pain, no history of IV drug use, no recent falls not on anticoag's no lightheaded dizziness nausea or vomiting. ? ?Reviewed patient's chart was seen here 2 days ago had negative respiratory panel, was discharged hom I have reviewed patient's recent imaging. ? ?Home Medications ?Prior to Admission medications   ?Medication Sig Start Date End Date Taking? Authorizing Provider  ?ondansetron (ZOFRAN) 4 MG tablet Take 1 tablet (4 mg total) by mouth every 6 (six) hours. 03/12/22  Yes Marcello Fennel, PA-C  ?ibuprofen (ADVIL) 600 MG tablet Take 1 tablet (600 mg total) by mouth every 6 (six) hours. 07/29/21   Cresenzo-Dishmon, Joaquim Lai, CNM  ?ibuprofen (ADVIL) 600 MG tablet Take 1 tablet (600 mg total) by mouth every 6 (six) hours as  needed. 12/22/21   Horton, Barbette Hair, MD  ?naproxen (NAPROSYN) 500 MG tablet Take 1 tablet (500 mg total) by mouth 2 (two) times daily with a meal. 01/09/22   Jaynee Eagles, PA-C  ?phentermine (ADIPEX-P) 37.5 MG tablet Take 18.75 mg by mouth 2 (two) times daily. 08/22/21   [provider]  ?sertraline (ZOLOFT) 100 MG tablet Take 100 mg by mouth daily. 08/22/21   [provider]  ?sulfamethoxazole-trimethoprim (BACTRIM DS) 800-160 MG tablet Take 1 tablet by mouth 2 (two) times daily. Take 1 bid 01/20/22   Estill Dooms, NP  ?tiZANidine (ZANAFLEX) 4 MG tablet Take 1 tablet (4 mg total) by mouth at bedtime. 01/09/22   Jaynee Eagles, PA-C  ?   ? ?Allergies    ?Patient has no known allergies.   ? ?[name]   ?[name]  ?Constitutional:  Positive for appetite change, chills and fever.  ?Respiratory:  Negative for shortness of breath.   ?Cardiovascular:  Negative for chest pain.  ?Gastrointestinal:  Positive for nausea. Negative for abdominal pain and vomiting.  ?Neurological:  Positive for headaches. Negative for dizziness.  ? ?Physical Exam ?Updated Vital Signs ?BP 103/63 (BP Location: Left Arm)   Pulse (!) 109   Temp 100 ?F (37.8 ?C) (Oral)   Resp 20   Ht 5\' 7"  (1.702 m)   Wt (!) 149.7 kg   LMP 03/01/2022 (Exact Date)   SpO2 97%   BMI 51.69 kg/m?  ?Physical Exam ?Vitals and nursing note reviewed.  ?Constitutional:   ?   General: She is not in acute distress. ?  Appearance: She is not ill-appearing.  ?HENT:  ?   Head: Normocephalic and atraumatic.  ?   Right Ear: Tympanic membrane, ear canal and external ear normal.  ?   Left Ear: Tympanic membrane, ear canal and external ear normal.  ?   Nose: No congestion.  ?   Mouth/Throat:  ?   Mouth: Mucous membranes are moist.  ?   Pharynx: Oropharynx is clear. No oropharyngeal exudate or posterior oropharyngeal erythema.  ?   Comments: No trismus, no torticollis, tongue and uvula both midline controlling oral secretions tonsils equal  symmetric bilaterally no tongue elevation ?Eyes:  ?   Conjunctiva/sclera: Conjunctivae normal.  ?Cardiovascular:  ?   Rate and Rhythm: Normal rate and regular rhythm.  ?   Pulses: Normal pulses.  ?   Heart sounds: No murmur heard. ?  No friction rub. No gallop.  ?Pulmonary:  ?   Effort: No respiratory distress.  ?   Breath sounds: No wheezing, rhonchi or rales.  ?Abdominal:  ?   Palpations: Abdomen is soft.  ?   Tenderness: There is no abdominal tenderness. There is no right CVA tenderness or left CVA tenderness.  ?Musculoskeletal:  ?   Right lower leg: No edema.  ?   Left lower leg: No edema.  ?   Comments: Spine was nontender to palpation no overlying skin changes  ?Skin: ?   General: Skin is warm and dry.  ?Neurological:  ?   Mental Status: She is alert.  ?   GCS: GCS eye subscore is 4. GCS verbal subscore is 5. GCS motor subscore is 6.  ?   Cranial Nerves: Cranial nerves 2-12 are intact. No cranial nerve deficit.  ?   Sensory: Sensation is intact.  ?   Motor: No weakness.  ?   Coordination: Romberg sign negative. Finger-Nose-Finger Test normal.  ?   Comments: Cranial nerves II through XII grossly intact no trouble with word finding, following two-step commands, no weakness present  ?Psychiatric:     ?   Mood and Affect: Mood normal.  ? ? ?ED Results / Procedures / Treatments   ?Labs ?(all labs ordered are listed, but only abnormal results are displayed) ?Labs Reviewed - No data to display ? ?EKG ?None ? ?Radiology ?No results found. ? ?Procedures ?Procedures  ? ? ?Medications Ordered in ED ?Medications  ?ibuprofen (ADVIL) tablet 600 mg (600 mg Oral Given 03/12/22 1155)  ?prochlorperazine (COMPAZINE) tablet 10 mg (10 mg Oral Given 03/12/22 1155)  ?acetaminophen (TYLENOL) tablet 1,000 mg (1,000 mg Oral Given 03/12/22 1254)  ?HYDROmorphone (DILAUDID) injection 0.5 mg (0.5 mg Intramuscular Given 03/12/22 1256)  ?ondansetron (ZOFRAN-ODT) disintegrating tablet 4 mg (4 mg Oral Given 03/12/22 1254)  ? ? ?ED Course/ Medical  Decision Making/ A&P ?  ?                        ?Medical Decision Making ?Risk ?OTC drugs. ?Prescription drug management. ? ? ?This patient presents to the ED for concern of fevers chills headaches, this involves an extensive number of treatment options, and is a complaint that carries with it a high risk of complications and morbidity.  The differential diagnosis includes URI, CVA, meningitis ? ? ? ?Additional history obtained: ? ?Additional history obtained from N/A ?External records from outside source obtained and reviewed including previous imaging, recent ER note ? ? ?Co morbidities that complicate the patient evaluation ? ?Migraines, anxiety ? ?Social Determinants of Health: ? ?N/A  ? ? ? ?  Lab Tests: ? ?I Ordered, and personally interpreted labs.  The pertinent results include: N/A ? ? ?Imaging Studies ordered: ? ?I ordered imaging studies including N/A ?I independently visualized and interpreted imaging which showed N/A ?I agree with the radiologist interpretation ? ? ?Cardiac Monitoring: ? ?The patient was maintained on a cardiac monitor.  I personally viewed and interpreted the cardiac monitored which showed an underlying rhythm of: N/A ? ? ?Medicines ordered and prescription drug management: ? ?I ordered medication including Compazine, ibuprofen for headache ?I have reviewed the patients home medicines and have made adjustments as needed ? ?Critical Interventions: ? ?N/A ? ? ?Reevaluation: ? ?Presented with fever chills headaches general body aches, presentation and exam are consistent with likely viral infection, will provide her with a migraine cocktail and reassess. ? ?Reassessed, states she is feeling better, she is tolerating p.o., vital signs are reassuring, she is ready for discharge. ? ? ?Consultations Obtained: ? ?N/A ? ? ? ?Test Considered: ? ?CT head will defer as my suspicion for intracranial head bleed very low at this time no recent falls not on anticoag's.  Low suspicion for CVA no  neurodeficit present my exam presentation atypical of etiology. ? ? ? ?Rule out ?Low suspicion for dissection of the vertebral or carotid artery as presentation atypical of etiology.  Low suspicion for meningitis as she ha

## 2022-03-12 NOTE — ED Notes (Addendum)
Last tylenol dose at 0300 ?Last motrin dose at 0630 ? ?+HA +generalized weakness, +sweating ?Highest fever was 103.4 ?

## 2022-03-13 ENCOUNTER — Telehealth: Payer: Self-pay

## 2022-03-13 ENCOUNTER — Emergency Department (HOSPITAL_COMMUNITY): Payer: Medicaid Other

## 2022-03-13 ENCOUNTER — Encounter (HOSPITAL_COMMUNITY): Payer: Self-pay

## 2022-03-13 ENCOUNTER — Emergency Department (HOSPITAL_COMMUNITY)
Admission: EM | Admit: 2022-03-13 | Discharge: 2022-03-14 | Disposition: A | Discharge status: Home / Self Care | Payer: Medicaid Other | Attending: Emergency Medicine | Admitting: Emergency Medicine

## 2022-03-13 DIAGNOSIS — R109 Unspecified abdominal pain: Secondary | ICD-10-CM | POA: Diagnosis not present

## 2022-03-13 DIAGNOSIS — D72829 Elevated white blood cell count, unspecified: Secondary | ICD-10-CM | POA: Diagnosis not present

## 2022-03-13 DIAGNOSIS — Z20822 Contact with and (suspected) exposure to covid-19: Secondary | ICD-10-CM | POA: Diagnosis not present

## 2022-03-13 DIAGNOSIS — R944 Abnormal results of kidney function studies: Secondary | ICD-10-CM | POA: Insufficient documentation

## 2022-03-13 DIAGNOSIS — J069 Acute upper respiratory infection, unspecified: Secondary | ICD-10-CM | POA: Diagnosis not present

## 2022-03-13 DIAGNOSIS — N12 Tubulo-interstitial nephritis, not specified as acute or chronic: Secondary | ICD-10-CM | POA: Diagnosis not present

## 2022-03-13 DIAGNOSIS — R509 Fever, unspecified: Secondary | ICD-10-CM | POA: Diagnosis not present

## 2022-03-13 DIAGNOSIS — R059 Cough, unspecified: Secondary | ICD-10-CM | POA: Diagnosis not present

## 2022-03-13 DIAGNOSIS — R1084 Generalized abdominal pain: Secondary | ICD-10-CM | POA: Diagnosis not present

## 2022-03-13 DIAGNOSIS — R112 Nausea with vomiting, unspecified: Secondary | ICD-10-CM | POA: Diagnosis present

## 2022-03-13 DIAGNOSIS — Z9049 Acquired absence of other specified parts of digestive tract: Secondary | ICD-10-CM | POA: Diagnosis not present

## 2022-03-13 DIAGNOSIS — R000 Tachycardia, unspecified: Secondary | ICD-10-CM | POA: Diagnosis not present

## 2022-03-13 DIAGNOSIS — R52 Pain, unspecified: Secondary | ICD-10-CM | POA: Insufficient documentation

## 2022-03-13 LAB — CBC WITH DIFFERENTIAL/PLATELET
Abs Immature Granulocytes: 0.13 10*3/uL — ABNORMAL HIGH (ref 0.00–0.07)
Basophils Absolute: 0 10*3/uL (ref 0.0–0.1)
Basophils Relative: 0 %
Eosinophils Absolute: 0 10*3/uL (ref 0.0–0.5)
Eosinophils Relative: 0 %
HCT: 37.6 % (ref 36.0–46.0)
Hemoglobin: 12.2 g/dL (ref 12.0–15.0)
Immature Granulocytes: 1 %
Lymphocytes Relative: 10 %
Lymphs Abs: 1.5 10*3/uL (ref 0.7–4.0)
MCH: 26.7 pg (ref 26.0–34.0)
MCHC: 32.4 g/dL (ref 30.0–36.0)
MCV: 82.3 fL (ref 80.0–100.0)
Monocytes Absolute: 2 10*3/uL — ABNORMAL HIGH (ref 0.1–1.0)
Monocytes Relative: 13 %
Neutro Abs: 11 10*3/uL — ABNORMAL HIGH (ref 1.7–7.7)
Neutrophils Relative %: 76 %
Platelets: 247 10*3/uL (ref 150–400)
RBC: 4.57 MIL/uL (ref 3.87–5.11)
RDW: 13.1 % (ref 11.5–15.5)
WBC: 14.7 10*3/uL — ABNORMAL HIGH (ref 4.0–10.5)
nRBC: 0 % (ref 0.0–0.2)

## 2022-03-13 LAB — BASIC METABOLIC PANEL
Anion gap: 8 (ref 5–15)
BUN: 10 mg/dL (ref 6–20)
CO2: 23 mmol/L (ref 22–32)
Calcium: 8.6 mg/dL — ABNORMAL LOW (ref 8.9–10.3)
Chloride: 99 mmol/L (ref 98–111)
Creatinine, Ser: 1.54 mg/dL — ABNORMAL HIGH (ref 0.44–1.00)
GFR, Estimated: 47 mL/min — ABNORMAL LOW (ref >60)
Glucose, Bld: 124 mg/dL — ABNORMAL HIGH (ref 70–99)
Potassium: 3.5 mmol/L (ref 3.5–5.1)
Sodium: 130 mmol/L — ABNORMAL LOW (ref 135–145)

## 2022-03-13 LAB — I-STAT BETA HCG BLOOD, ED (MC, WL, AP ONLY): I-stat hCG, quantitative: 21.7 m[IU]/mL — ABNORMAL HIGH (ref <5)

## 2022-03-13 LAB — RESP PANEL BY RT-PCR (FLU A&B, COVID) ARPGX2
Influenza A by PCR: NEGATIVE
Influenza B by PCR: NEGATIVE
SARS Coronavirus 2 by RT PCR: NEGATIVE

## 2022-03-13 LAB — GROUP A STREP BY PCR: Group A Strep by PCR: NOT DETECTED

## 2022-03-13 MED ORDER — ACETAMINOPHEN 325 MG PO TABS
650.0000 mg | ORAL_TABLET | Freq: Once | ORAL | Status: AC
  Administered 2022-03-13: 650 mg via ORAL
  Filled 2022-03-13: qty 2

## 2022-03-13 NOTE — ED Provider Triage Note (Addendum)
Emergency Medicine Provider Triage Evaluation Note ? ?Patricia [name] , a 31 y.o. female  was evaluated in triage.  Pt complains of URI-like symptoms since this past Thursday.  She has been to to [facility], ED twice.  She had a negative COVID and flu test on her first ED visit.  She was discharged home.  She returned again yesterday feeling more fatigued.  She was provided with medications with some relief however today reports that her symptoms are worse and they are not getting any better.  She states that she is unable to break the fever despite alternating Tylenol and ibuprofen.  She complains of a headache, body aches, sore throat, cough.  Per chart review she was not complaining of sore throat or cough during her last ED visits and did not have chest x-ray done at that time. Denies any recent sick contacts.  ? ?[name]  ?Positive: + fevers, chills, fatigue, cough, body aches, sore throat, headache ?Negative:  ? ?Physical Exam  ?BP (!) 111/46   Pulse (!) 143   Temp (!) 102.8 ?F (39.3 ?C)   Resp 18   LMP 03/01/2022 (Exact Date)   SpO2 99%  ?Gen:   Awake, no distress   ?Resp:  Normal effort  ?MSK:   Moves extremities without difficulty  ?Other:   ? ?Medical Decision Making  ?Medically screening exam initiated at 8:42 PM.  Appropriate orders placed.  Patricia [name] was informed that the remainder of the evaluation will be completed by another provider, this initial triage assessment does not replace that evaluation, and the importance of remaining in the ED until their evaluation is complete. ? ?Hr in the 150's. Triage nurse Janett Billow made aware patient should go back to a room.  ?  ?Eustaquio Maize, PA-C ?03/13/22 2108 ? ?

## 2022-03-13 NOTE — ED Triage Notes (Signed)
Pt states that she has had a fever, bodeaches since Thu, tested for covid that was negative, now having CP and SOB as well.  ?

## 2022-03-13 NOTE — Telephone Encounter (Signed)
Transition Care Management Unsuccessful Follow-up Telephone Call ? ?Date of discharge and from where:  03/12/2022-[facility]  ? ?Attempts:  1st Attempt ? ?Reason for unsuccessful TCM follow-up call:  Left voice message ? ?  ?

## 2022-03-14 ENCOUNTER — Emergency Department (HOSPITAL_COMMUNITY): Payer: Medicaid Other

## 2022-03-14 DIAGNOSIS — R109 Unspecified abdominal pain: Secondary | ICD-10-CM | POA: Diagnosis not present

## 2022-03-14 DIAGNOSIS — J069 Acute upper respiratory infection, unspecified: Secondary | ICD-10-CM | POA: Diagnosis not present

## 2022-03-14 DIAGNOSIS — Z9049 Acquired absence of other specified parts of digestive tract: Secondary | ICD-10-CM | POA: Diagnosis not present

## 2022-03-14 LAB — RESPIRATORY PANEL BY PCR

## 2022-03-14 LAB — URINALYSIS, ROUTINE W REFLEX MICROSCOPIC
Bilirubin Urine: NEGATIVE
Glucose, UA: NEGATIVE mg/dL
Ketones, ur: 20 mg/dL — AB
Nitrite: NEGATIVE
Protein, ur: 100 mg/dL — AB
Specific Gravity, Urine: 1.018 (ref 1.005–1.030)
WBC, UA: >50 WBC/hpf — ABNORMAL HIGH (ref 0–5)
pH: 5 (ref 5.0–8.0)

## 2022-03-14 LAB — PREGNANCY, URINE: Preg Test, Ur: NEGATIVE

## 2022-03-14 MED ORDER — OXYCODONE-ACETAMINOPHEN 5-325 MG PO TABS
1.0000 | ORAL_TABLET | Freq: Four times a day (QID) | ORAL | 0 refills | Status: AC | PRN
Start: 2022-03-14 — End: ?

## 2022-03-14 MED ORDER — SODIUM CHLORIDE 0.9 % IV SOLN
1.0000 g | Freq: Once | INTRAVENOUS | Status: AC
  Administered 2022-03-14: 1 g via INTRAVENOUS
  Filled 2022-03-14: qty 10

## 2022-03-14 MED ORDER — LACTATED RINGERS IV BOLUS
1000.0000 mL | Freq: Once | INTRAVENOUS | Status: AC
  Administered 2022-03-14: 1000 mL via INTRAVENOUS

## 2022-03-14 MED ORDER — IBUPROFEN 800 MG PO TABS
800.0000 mg | ORAL_TABLET | Freq: Once | ORAL | Status: AC
  Administered 2022-03-14: 800 mg via ORAL
  Filled 2022-03-14: qty 1

## 2022-03-14 MED ORDER — CEPHALEXIN 500 MG PO CAPS: 500.0000 mg | ORAL_CAPSULE | Freq: Two times a day (BID) | ORAL | 0 refills | Status: AC

## 2022-03-14 MED ORDER — ACETAMINOPHEN 500 MG PO TABS
1000.0000 mg | ORAL_TABLET | Freq: Once | ORAL | Status: AC
  Administered 2022-03-14: 1000 mg via ORAL
  Filled 2022-03-14: qty 2

## 2022-03-14 MED ORDER — IOHEXOL 350 MG/ML SOLN
100.0000 mL | Freq: Once | INTRAVENOUS | Status: AC | PRN
  Administered 2022-03-14: 100 mL via INTRAVENOUS

## 2022-03-14 MED ORDER — ONDANSETRON 4 MG PO TBDP: 4.0000 mg | ORAL_TABLET | Freq: Three times a day (TID) | ORAL | 0 refills | Status: DC | PRN

## 2022-03-14 MED ORDER — LACTATED RINGERS IV BOLUS
1000.0000 mL | Freq: Once | INTRAVENOUS | Status: AC
Start: 2022-03-14 — End: 2022-03-14
  Administered 2022-03-14: 1000 mL via INTRAVENOUS

## 2022-03-14 NOTE — ED Provider Notes (Signed)
?[facility]-EMERGENCY DEPT ?[facility] Emergency Department ?Provider Note ?MRN:  [id number]  ?Arrival date & time: 03/14/22    ? ?Chief Complaint   ?Chest Pain ?  ?History of Present Illness   ?Patricia [name] is a 31 y.o. year-old female presents to the ED with chief complaint of fevers, chills, body aches, generalized abdominal discomfort.  She also reports an episode of nausea and vomiting.  She states she has been having the symptoms for the past 4 to 5 days.  Has been seen in the ED previously for the same.  She denies any significant improvement of her symptoms at home.  She has tried OTC medications. ? ? ? ? ?[name]  ?Pertinent review of systems noted in HPI.  ? ? ?Physical Exam  ? ?Vitals:  ? 03/14/22 0639 03/14/22 0653  ?BP:    ?Pulse: (!) 101   ?Resp: (!) 24   ?Temp:  98.6 ?F (37 ?C)  ?SpO2: 97%   ?  ?CONSTITUTIONAL: Nontoxic-appearing, NAD ?NEURO:  Alert and oriented x 3, CN 3-12 grossly intact ?EYES:  eyes equal and reactive ?ENT/NECK:  Supple, no stridor  ?CARDIO:  tachycardic, regular rhythm, appears well-perfused  ?PULM:  No respiratory distress, CTAB ?GI/GU:  non-distended, mild generalized abdominal discomfort ?MSK/SPINE:  No gross deformities, no edema, moves all extremities  ?SKIN:  no rash, atraumatic ? ? ?*Additional and/or pertinent findings included in MDM below ? ?Diagnostic and Interventional Summary  ? ? EKG Interpretation ? ?Date/Time:    ?Ventricular Rate:    ?PR Interval:    ?QRS Duration:   ?QT Interval:    ?QTC Calculation:   ?R Axis:     ?Text Interpretation:   ?  ? ?  ? ?Labs Reviewed  ?BASIC METABOLIC PANEL - Abnormal; Notable for the following components:  ?    Result Value  ? Sodium 130 (*)   ? Glucose, Bld 124 (*)   ? Creatinine, Ser 1.54 (*)   ? Calcium 8.6 (*)   ? GFR, Estimated 47 (*)   ? All other components within normal limits  ?CBC WITH DIFFERENTIAL/PLATELET - Abnormal; Notable for the following components:  ? WBC 14.7 (*)   ? Neutro Abs 11.0 (*)   ? Monocytes  Absolute 2.0 (*)   ? Abs Immature Granulocytes 0.13 (*)   ? All other components within normal limits  ?URINALYSIS, ROUTINE W REFLEX MICROSCOPIC - Abnormal; Notable for the following components:  ? Color, Urine AMBER (*)   ? APPearance CLOUDY (*)   ? Hgb urine dipstick SMALL (*)   ? Ketones, ur 20 (*)   ? Protein, ur 100 (*)   ? Leukocytes,Ua LARGE (*)   ? WBC, UA >50 (*)   ? Bacteria, UA MANY (*)   ? All other components within normal limits  ?I-STAT BETA HCG BLOOD, ED ([facility], [facility], [facility] ONLY) - Abnormal; Notable for the following components:  ? I-stat hCG, quantitative 21.7 (*)   ? All other components within normal limits  ?RESP PANEL BY RT-PCR (FLU A&B, COVID) ARPGX2  ?GROUP A STREP BY PCR  ?RESPIRATORY PANEL BY PCR  ?PREGNANCY, URINE  ?  ?CT Angio Chest PE W and/or Wo Contrast  ?Final Result  ?  ?CT ABDOMEN PELVIS W CONTRAST  ?Final Result  ?  ?DG Chest 2 View  ?Final Result  ?  ?  ?Medications  ?acetaminophen (TYLENOL) tablet 650 mg (650 mg Oral Given 03/13/22 2106)  ?lactated ringers bolus 1,000 mL (1,000 mLs Intravenous  New Bag/Given 03/14/22 0512)  ?ibuprofen (ADVIL) tablet 800 mg (800 mg Oral Given 03/14/22 0504)  ?cefTRIAXone (ROCEPHIN) 1 g in sodium chloride 0.9 % 100 mL IVPB (0 g Intravenous Stopped 03/14/22 0654)  ?lactated ringers bolus 1,000 mL (1,000 mLs Intravenous New Bag/Given 03/14/22 0545)  ?acetaminophen (TYLENOL) tablet 1,000 mg (1,000 mg Oral Given 03/14/22 0539)  ?iohexol (OMNIPAQUE) 350 MG/ML injection 100 mL (100 mLs Intravenous Contrast Given 03/14/22 0615)  ?lactated ringers bolus 1,000 mL (1,000 mLs Intravenous New Bag/Given 03/14/22 0654)  ?  ? ?Procedures  /  Critical Care ?.Critical Care ?Performed by: Roxy Horseman, PA-C ?Authorized by: Roxy Horseman, PA-C  ? ?Critical care provider statement:  ?  Critical care time (minutes):  37 ?  Critical care was time spent personally by me on the following activities:  Development of treatment plan with patient or surrogate, discussions with  consultants, evaluation of patient's response to treatment, examination of patient, ordering and review of laboratory studies, ordering and review of radiographic studies, ordering and performing treatments and interventions, pulse oximetry, re-evaluation of patient's condition and review of old charts ? ?ED Course and Medical Decision Making  ?I have reviewed the triage vital signs, the nursing notes, and pertinent available records from the EMR. ? ?Complexity of Problems Addressed: ?High Complexity: Acute illness/injury posing a threat to life or bodily function, requiring emergent diagnostic workup, evaluation, and treatment as below. ?Comorbidities affecting this illness/injury include: ?Obesity, UTI ?Social Determinants Affecting Care: ?No clinically significant social determinants affecting this chief complaint.. ? ? ?ED Course: ?After considering the following differential, flu, COVID, pneumonia, PE, acute abdomen, I ordered labs, fluids, urinalysis, and CT scans. ?I personally interpreted the labs which are notable for moderate leukocytosis, mildly increased creatinine. ?I visualized the chest x-ray which is notable for no opacity and agree with the radiologist interpretation. ?I visualized the CT, which is notable for no obvious PE or pneumonia, no kidney stone and agree with radiologist interpretation. ?I observed the patient while on the cardiac monitor and noted no significant dysrhythmias.. ? ?Clinical Course as of 03/14/22 0658  ?Tue Mar 14, 2022  ?6195 Patient here with fever and tachycardia.  Urine pregnancy test negative.  Urinalysis is consistent with infection.  Based on description of symptoms I am concerned for pyelonephritis.  She did have a CT PE due to some chest pain and tachycardia, but also to rule out occult pneumonia.  This was negative.  CT abdomen shows some evidence of pyelonephritis, this fits with the clinical picture and with her urinalysis.  She was given several liters of fluid  in the ED with good improvement and normalization of her heart rate.  Her fever is downtrending.  She was given Rocephin IV for treatment of her pyelonephritis.  She has not had vomiting in the ED.  I do not think that she requires admission, but I did discuss strict return precautions with the patient.  She will discharged home with some Zofran, Keflex for 2 weeks, and some pain medicine. [RB]  ?  ?Clinical Course User Index ?[RB] Roxy Horseman, PA-C  ? ? ?Consultants: ?No consultations were needed in caring for this patient. ? ?Treatment and Plan: ?As above ? ?I considered admission due to patient's initial presentation, but after considering the examination and diagnostic results, patient will not require admission and can be discharged with outpatient follow-up. ? ? ? ?Final Clinical Impressions(s) / ED Diagnoses  ? ?  ICD-10-CM   ?1. Pyelonephritis  N12   ?  ?  ?  ED Discharge Orders   ? ?      Ordered  ?  cephALEXin (KEFLEX) 500 MG capsule  2 times daily       ? 03/14/22 0652  ?  ondansetron (ZOFRAN-ODT) 4 MG disintegrating tablet  Every 8 hours PRN       ? 03/14/22 0652  ?  oxyCODONE-acetaminophen (PERCOCET) 5-325 MG tablet  Every 6 hours PRN       ? 03/14/22 4010  ? ?  ?  ? ?  ?  ? ? ?Discharge Instructions Discussed with and Provided to Patient:  ? ? ?Discharge Instructions   ? ?  ?Return for new or worsening symptoms.  Take antibiotics as prescribed.   ? ?Your workup seems most consistent with pyelonephritis (kidney infection). ? ? ? ?  ?Roxy Horseman, PA-C ?03/14/22 0700 ? ?  ?Nira Conn, MD ?03/14/22 [phone] ? ?

## 2022-03-14 NOTE — ED Notes (Signed)
Patient dressed herself and left department before vital signs could be obtain. Patient ambulated to restroom to dress independently with steady gait and in no apparent distress. ?

## 2022-03-14 NOTE — Discharge Instructions (Signed)
Return for new or worsening symptoms.  Take antibiotics as prescribed.   ? ?Your workup seems most consistent with pyelonephritis (kidney infection). ?

## 2022-03-14 NOTE — ED Notes (Signed)
Pt to CT

## 2022-03-14 NOTE — ED Notes (Signed)
Micro to add on 20 pathogen panel  ?

## 2022-03-14 NOTE — Telephone Encounter (Signed)
Transition Care Management Unsuccessful Follow-up Telephone Call ? ?Date of discharge and from where:  03/12/2022-[place]  ? ?Attempts:  2nd Attempt ? ?Reason for unsuccessful TCM follow-up call:  Left voice message ? ?  ?

## 2022-03-15 ENCOUNTER — Telehealth: Payer: Self-pay

## 2022-03-15 NOTE — Patient Instructions (Signed)
Thank you for speaking with me today regarding care management and care coordination needs. I have scheduled you with one of our nurses on 04/18/22 at 2:30pm. If you have any questions please give me a call at [phone]. ? ?Gus Puma, BSW, [place] ?[facility]  ?High Risk Managed Medicaid Team  ?[phone]  ?

## 2022-03-15 NOTE — Patient Outreach (Signed)
Care Coordination ? ?03/15/2022 ? ?Milana Huntsman ?11/19/1991 ?[id number] ? ?BSW completed telephone outreach with patient. Patient states she is doing well since being discharged and knows what is going on. Patient stated she has not made a follow up appointment but will do so tomorrow. Patient did accept MM services and BSW will schedule with RNCM. ? ? ?Mickel Fuchs, BSW, [place] ?[place]  ?High Risk Managed Medicaid Team  ?[phone]  ?

## 2022-03-15 NOTE — Telephone Encounter (Signed)
Transition Care Management Follow-up Telephone Call ?Date of discharge and from where: 03/14/2022-[place]  ?How have you been since you were released from the hospital? Pt stated she is doing ok.  ?Any questions or concerns? No ? ?Items Reviewed: ?Did the pt receive and understand the discharge instructions provided? Yes  ?Medications obtained and verified? Yes  ?Other? No  ?Any new allergies since your discharge? No  ?Dietary orders reviewed? No ?Do you have support at home? Yes  ? ?Home Care and Equipment/Supplies: ?Were home health services ordered? not applicable ?If so, what is the name of the agency? N/A  ?Has the agency set up a time to come to the patient's home? not applicable ?Were any new equipment or medical supplies ordered?  No ?What is the name of the medical supply agency? N/A ?Were you able to get the supplies/equipment? not applicable ?Do you have any questions related to the use of the equipment or supplies? No ? ?Functional Questionnaire: (I = Independent and D = Dependent) ?ADLs: I ? ?Bathing/Dressing- I ? ?Meal Prep- I ? ?Eating- I ? ?Maintaining continence- I ? ?Transferring/Ambulation- I ? ?Managing Meds- I ? ?Follow up appointments reviewed: ? ?PCP Hospital f/u appt confirmed? No   ?Specialist Hospital f/u appt confirmed? No   ?Are transportation arrangements needed? No  ?If their condition worsens, is the pt aware to call PCP or go to the Emergency Dept.? Yes ?Was the patient provided with contact information for the PCP's office or ED? Yes ?Was to pt encouraged to call back with questions or concerns? Yes  ?

## 2022-03-15 NOTE — Telephone Encounter (Signed)
Transition Care Management Follow-up Telephone Call ?Date of discharge and from where: 03/12/2022-[place]  ?How have you been since you were released from the hospital? Pt stated she is doing ok  ?Any questions or concerns? No ? ?Items Reviewed: ?Did the pt receive and understand the discharge instructions provided? Yes  ?Medications obtained and verified? Yes  ?Other? No  ?Any new allergies since your discharge? No  ?Dietary orders reviewed? No ?Do you have support at home? Yes  ? ?Home Care and Equipment/Supplies: ?Were home health services ordered? not applicable ?If so, what is the name of the agency? N/A  ?Has the agency set up a time to come to the patient's home? not applicable ?Were any new equipment or medical supplies ordered?  No ?What is the name of the medical supply agency? N/A ?Were you able to get the supplies/equipment? not applicable ?Do you have any questions related to the use of the equipment or supplies? No ? ?Functional Questionnaire: (I = Independent and D = Dependent) ?ADLs: I ? ?Bathing/Dressing- I ? ?Meal Prep- I ? ?Eating- I ? ?Maintaining continence- I ? ?Transferring/Ambulation- I ? ?Managing Meds- I ? ?Follow up appointments reviewed: ? ?PCP Hospital f/u appt confirmed? No   ?Specialist Hospital f/u appt confirmed? No   ?Are transportation arrangements needed? No  ?If their condition worsens, is the pt aware to call PCP or go to the Emergency Dept.? Yes ?Was the patient provided with contact information for the PCP's office or ED? Yes ?Was to pt encouraged to call back with questions or concerns? Yes  ?

## 2022-04-16 IMAGING — US US ABDOMEN COMPLETE
1 series · 14 of 25 positions shown · non-contrast
Comparison: 10/26/2020

CLINICAL DATA: Right upper quadrant pain. Twelve week of pregnancy.

EXAM:
ABDOMEN ULTRASOUND COMPLETE

[Series 1: us abdomen complete · 14 of 52 slices shown]
[im 1/52]
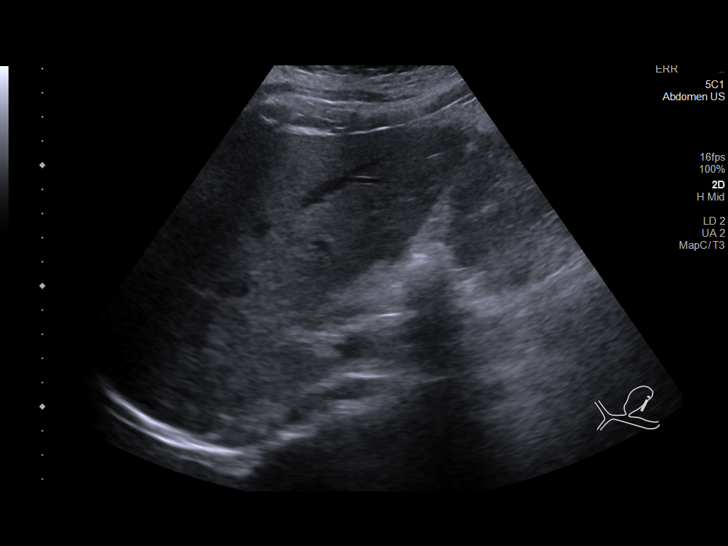
[im 5/52]
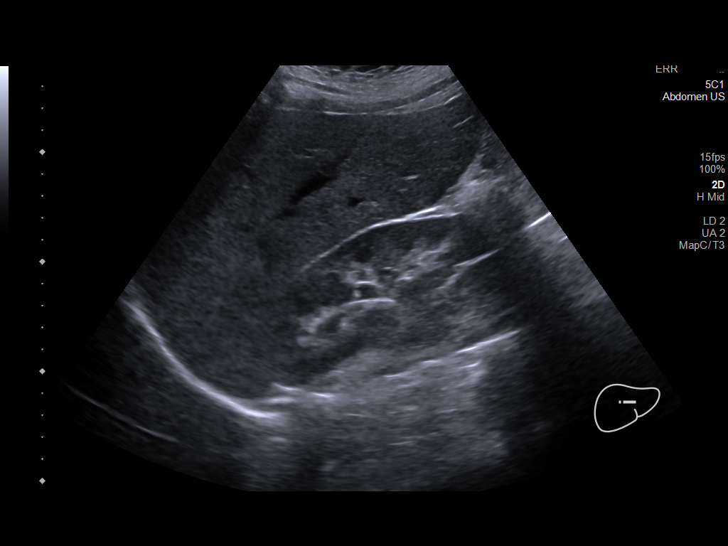
[im 9/52]
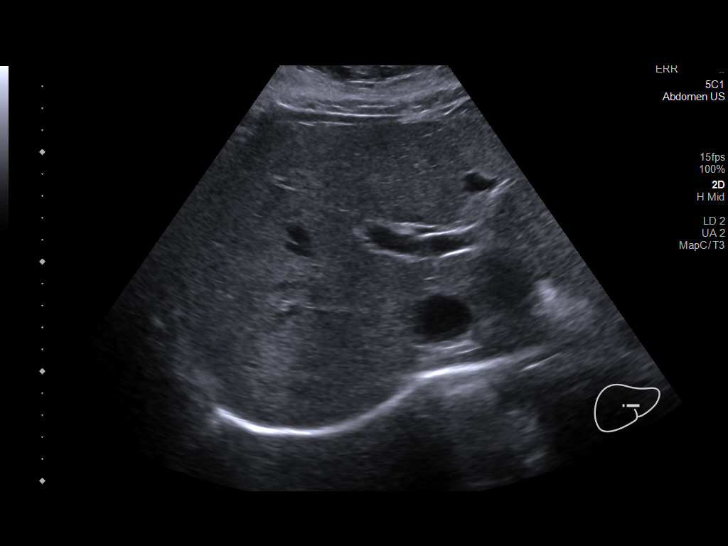
[im 13/52]
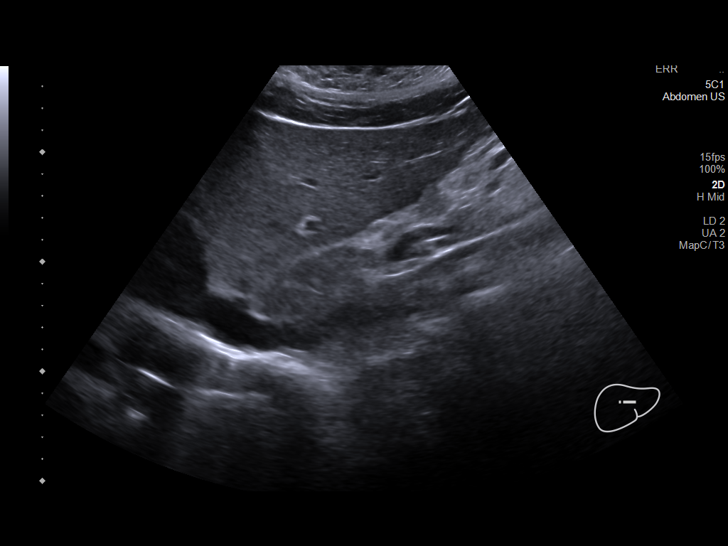
[im 18/52]
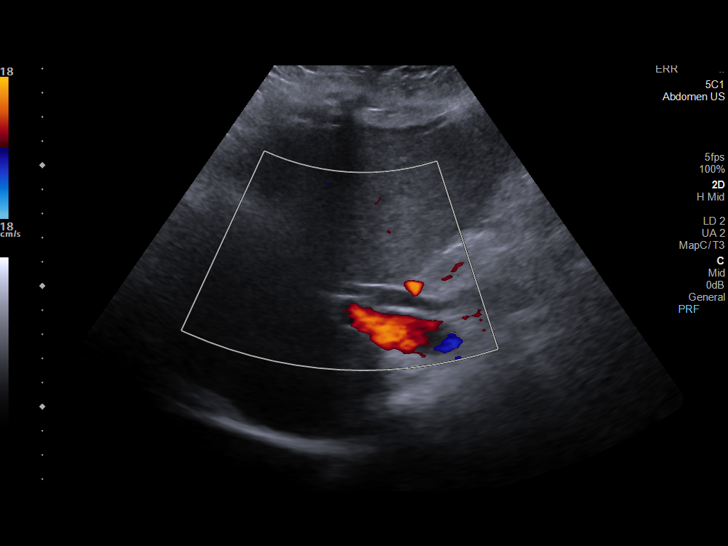
[im 20/52]
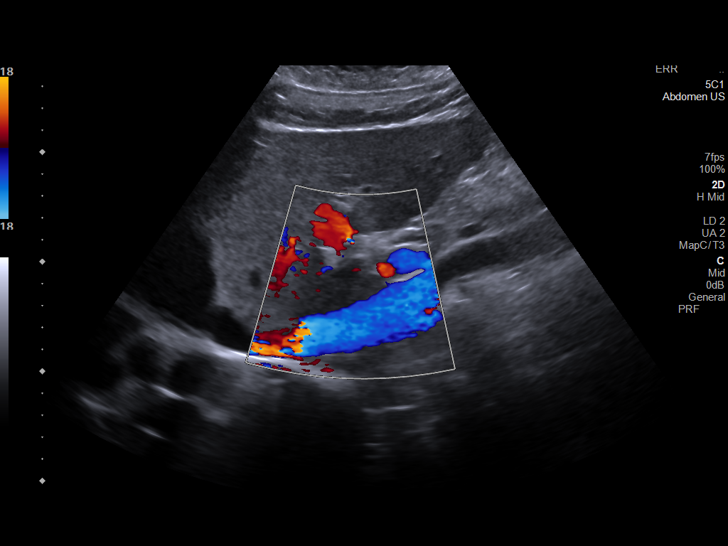
[im 24/52]
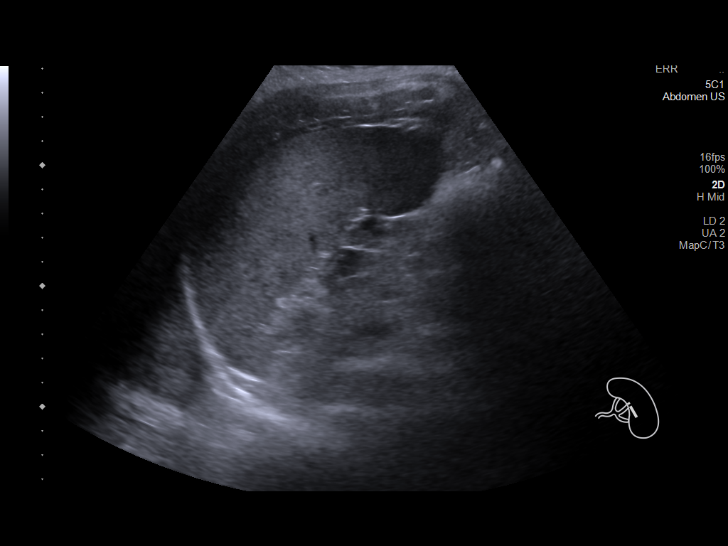
[im 28/52]
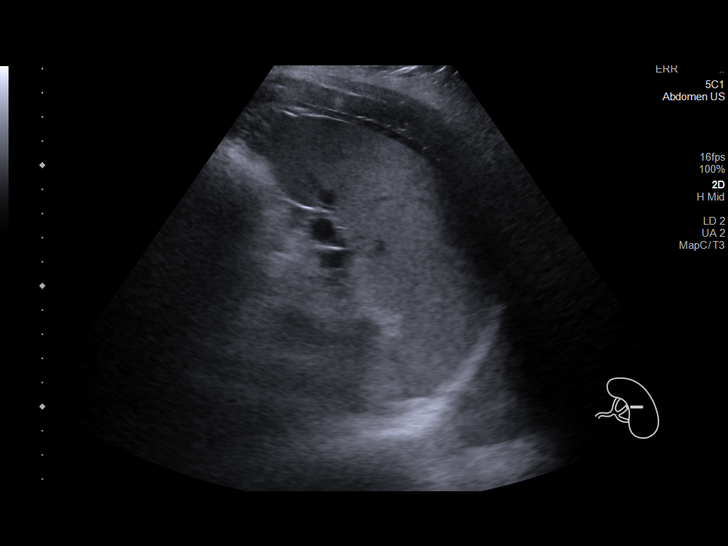
[im 32/52]
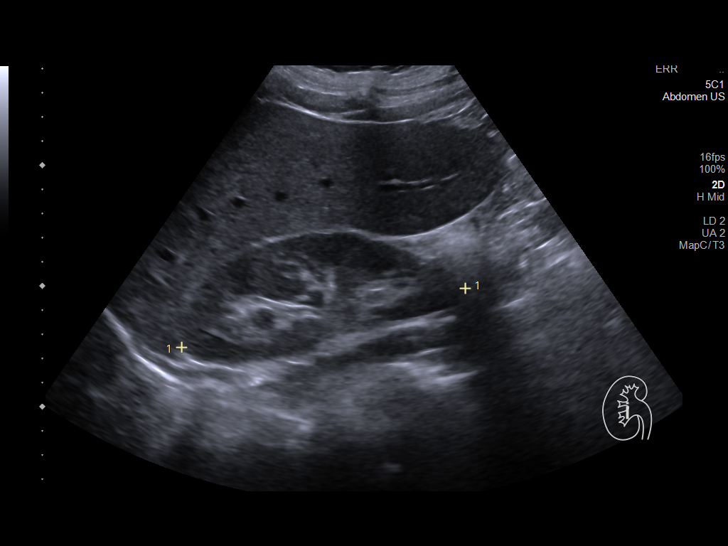
[im 35/52]
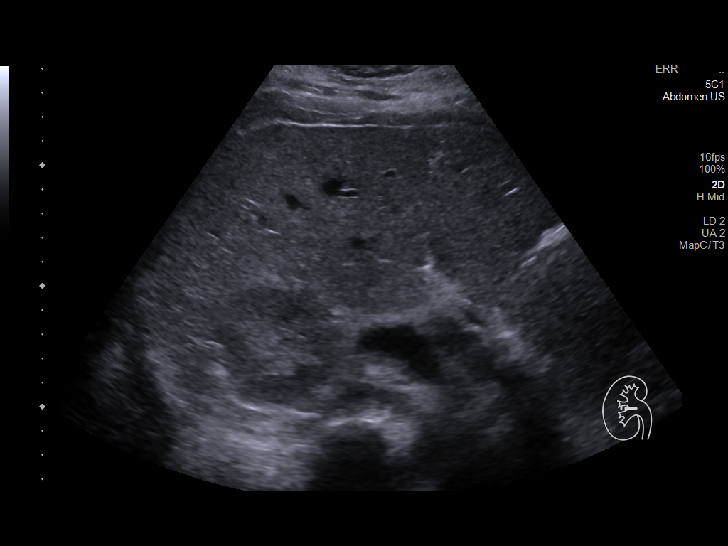
[im 39/52]
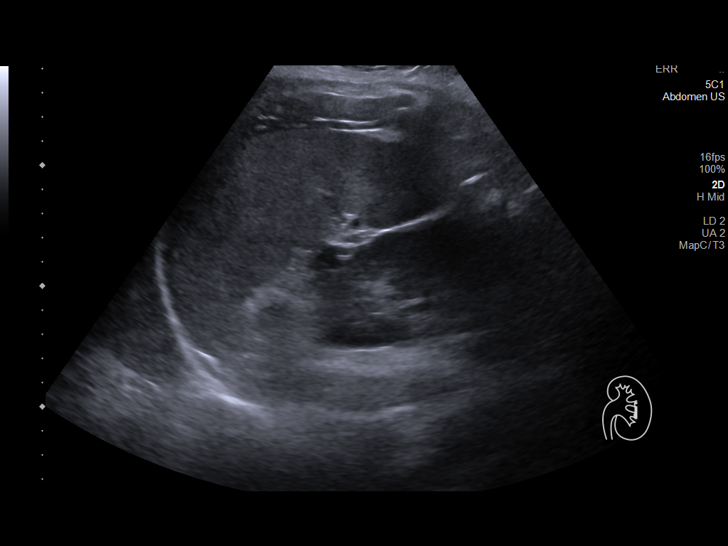
[im 43/52]
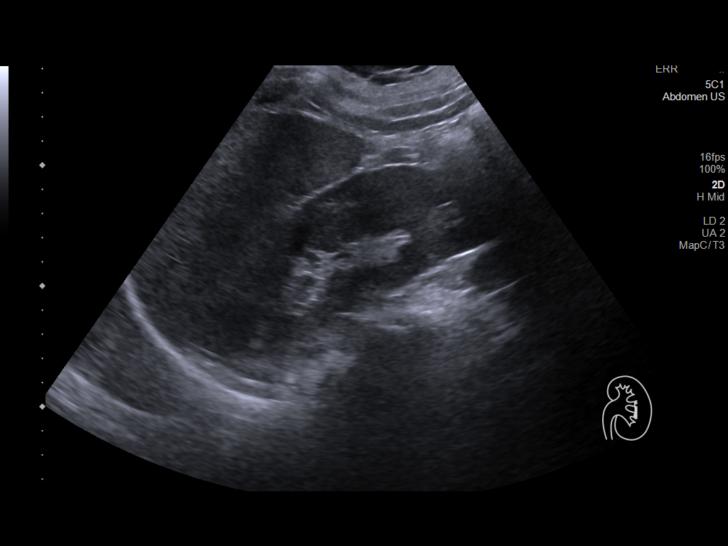
[im 47/52]
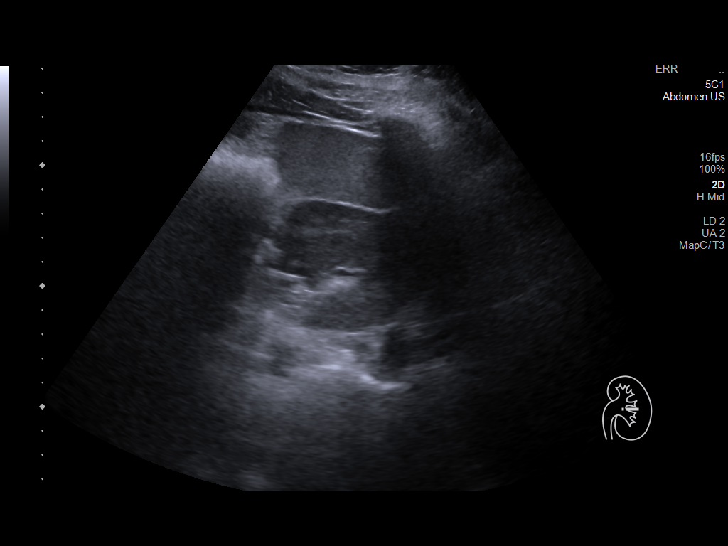
[im 52/52]
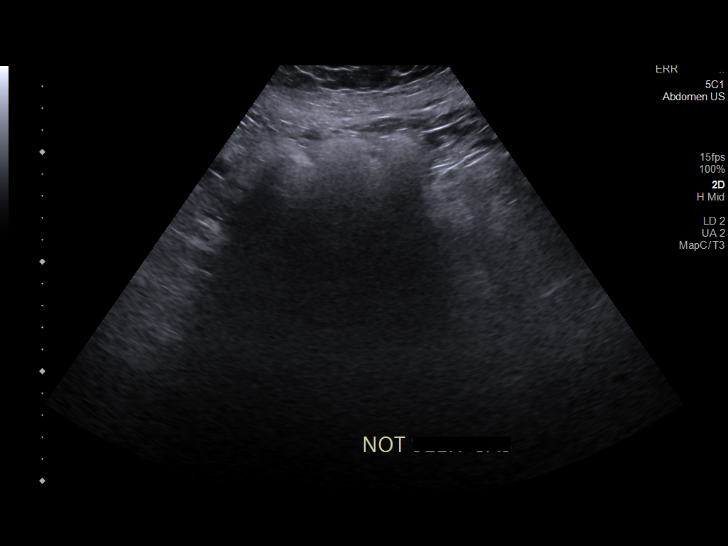

[14 of 25 positions shown; findings below may reference images not displayed]

FINDINGS: Gallbladder: Status post cholecystectomy.

Common bile duct: Diameter: 7.7 mm.  Previously 7 mm.

Liver: No focal lesion identified. Within normal limits in
parenchymal echogenicity. Portal vein is patent on color Doppler
imaging with normal direction of blood flow towards the liver.

IVC: No abnormality visualized.

Pancreas: Visualized portion unremarkable.

Spleen: Size and appearance within normal limits.

Right Kidney: Length: 1.2 cm. Echogenicity within normal limits. No
mass or hydronephrosis visualized.

Left Kidney: Length: 1.2 cm. Echogenicity within normal limits. No
mass or hydronephrosis visualized.

Abdominal aorta: No aneurysm visualized.

Other findings: None.
IMPRESSION: 1. No acute findings.
2. Status post cholecystectomy.

## 2022-04-18 ENCOUNTER — Other Ambulatory Visit: Payer: Self-pay | Admitting: *Deleted

## 2022-04-18 NOTE — Patient Outreach (Signed)
Care Coordination  04/18/2022  Patricia [name] [birth year] [id number]  RNCM unable to complete initial outreach with [name] today. During this telephone appointment, [name] requested RNCM call her back in 15 minutes. RNCM attempted to call [name] back, but was unable to reach her. RNCM will attempt to reach [name] back over the next 7 days to complete initial outreach.  Estanislado Emms RN, BSN [place]  [facility]

## 2022-05-07 ENCOUNTER — Encounter (HOSPITAL_COMMUNITY): Payer: Self-pay | Admitting: *Deleted

## 2022-05-07 ENCOUNTER — Emergency Department (HOSPITAL_COMMUNITY)
Admission: EM | Admit: 2022-05-07 | Discharge: 2022-05-07 | Disposition: A | Discharge status: Home / Self Care | Payer: Medicaid Other | Attending: Emergency Medicine | Admitting: Emergency Medicine

## 2022-05-07 ENCOUNTER — Other Ambulatory Visit: Payer: Self-pay

## 2022-05-07 DIAGNOSIS — R197 Diarrhea, unspecified: Secondary | ICD-10-CM | POA: Diagnosis not present

## 2022-05-07 DIAGNOSIS — R112 Nausea with vomiting, unspecified: Secondary | ICD-10-CM | POA: Insufficient documentation

## 2022-05-07 LAB — URINALYSIS, ROUTINE W REFLEX MICROSCOPIC
Bilirubin Urine: NEGATIVE
Glucose, UA: NEGATIVE mg/dL
Hgb urine dipstick: NEGATIVE
Ketones, ur: 5 mg/dL — AB
Leukocytes,Ua: NEGATIVE
Nitrite: NEGATIVE
Protein, ur: NEGATIVE mg/dL
Specific Gravity, Urine: 1.027 (ref 1.005–1.030)
pH: 5 (ref 5.0–8.0)

## 2022-05-07 LAB — COMPREHENSIVE METABOLIC PANEL
ALT: 23 U/L (ref 0–44)
AST: 20 U/L (ref 15–41)
Albumin: 4 g/dL (ref 3.5–5.0)
Alkaline Phosphatase: 50 U/L (ref 38–126)
Anion gap: 9 (ref 5–15)
BUN: 12 mg/dL (ref 6–20)
CO2: 24 mmol/L (ref 22–32)
Calcium: 8.6 mg/dL — ABNORMAL LOW (ref 8.9–10.3)
Chloride: 104 mmol/L (ref 98–111)
Creatinine, Ser: 0.96 mg/dL (ref 0.44–1.00)
GFR, Estimated: >60 mL/min (ref >60)
Glucose, Bld: 98 mg/dL (ref 70–99)
Potassium: 3.8 mmol/L (ref 3.5–5.1)
Sodium: 137 mmol/L (ref 135–145)
Total Bilirubin: 1.1 mg/dL (ref 0.3–1.2)
Total Protein: 7.5 g/dL (ref 6.5–8.1)

## 2022-05-07 LAB — CBC
HCT: 40.7 % (ref 36.0–46.0)
Hemoglobin: 13 g/dL (ref 12.0–15.0)
MCH: 27.2 pg (ref 26.0–34.0)
MCHC: 31.9 g/dL (ref 30.0–36.0)
MCV: 85.1 fL (ref 80.0–100.0)
Platelets: 210 10*3/uL (ref 150–400)
RBC: 4.78 MIL/uL (ref 3.87–5.11)
RDW: 13.8 % (ref 11.5–15.5)
WBC: 5 10*3/uL (ref 4.0–10.5)
nRBC: 0 % (ref 0.0–0.2)

## 2022-05-07 LAB — LIPASE, BLOOD: Lipase: 21 U/L (ref 11–51)

## 2022-05-07 MED ORDER — ONDANSETRON 4 MG PO TBDP
4.0000 mg | ORAL_TABLET | Freq: Once | ORAL | Status: AC
  Administered 2022-05-07: 4 mg via ORAL
  Filled 2022-05-07: qty 1

## 2022-05-07 MED ORDER — ONDANSETRON 4 MG PO TBDP: 4.0000 mg | ORAL_TABLET | Freq: Three times a day (TID) | ORAL | 0 refills | Status: AC | PRN

## 2022-05-07 NOTE — Discharge Instructions (Addendum)
Your tests were normal today.  Take the medications as written.  If you are unable to keep down any fluids or have worsening symptoms or fevers, return back to the ER.  Otherwise follow-up with your doctor in 3 to 4 days.

## 2022-05-07 NOTE — ED Triage Notes (Signed)
Pt with c/o emesis since 0100 this morning.

## 2022-05-07 NOTE — ED Notes (Signed)
Pt is up trying to give a urine sample

## 2022-05-07 NOTE — ED Provider Notes (Signed)
[facility] EMERGENCY DEPARTMENT Provider Note   CSN: [id number] Arrival date & time: 05/07/22  1716     History  Chief Complaint  Patient presents with   Emesis    Patricia [name] is a 31 y.o. female.  Patient presents chief complaint of vomiting and loose stools.  Symptoms ongoing since last night.  She states that symptoms started about an hour after she ate spaghetti that her mom had made.  She otherwise denies fevers denies cough denies chest pain.  Intermittent abdominal cramping but currently no pain reported.       Home Medications Prior to Admission medications   Medication Sig Start Date End Date Taking? Authorizing Provider  acetaminophen (TYLENOL) 500 MG tablet Take 500 mg by mouth every 6 (six) hours as needed for moderate pain.   Yes [provider]  ibuprofen (ADVIL) 600 MG tablet Take 1 tablet (600 mg total) by mouth every 6 (six) hours as needed. Patient taking differently: Take 600 mg by mouth every 6 (six) hours as needed for mild pain. 12/22/21  Yes Horton, Mayer Masker, MD  ondansetron (ZOFRAN-ODT) 4 MG disintegrating tablet Take 1 tablet (4 mg total) by mouth every 8 (eight) hours as needed for nausea or vomiting. 05/07/22  Yes Cheryll Cockayne, MD  phentermine (ADIPEX-P) 37.5 MG tablet Take 18.75 mg by mouth 2 (two) times daily. 08/22/21  Yes [provider]  sertraline (ZOLOFT) 100 MG tablet Take 100 mg by mouth at bedtime. 08/22/21  Yes [provider]  cephALEXin (KEFLEX) 500 MG capsule Take 1 capsule (500 mg total) by mouth 2 (two) times daily. Patient not taking: Reported on 04/18/2022 03/14/22   Roxy Horseman, PA-C  naproxen (NAPROSYN) 500 MG tablet Take 1 tablet (500 mg total) by mouth 2 (two) times daily with a meal. Patient not taking: Reported on 05/07/2022 01/09/22   Wallis Bamberg, PA-C  ondansetron (ZOFRAN) 4 MG tablet Take 1 tablet (4 mg total) by mouth every 6 (six) hours. Patient not taking: Reported on 04/18/2022 03/12/22    Carroll Sage, PA-C  oxyCODONE-acetaminophen (PERCOCET) 5-325 MG tablet Take 1-2 tablets by mouth every 6 (six) hours as needed. Patient not taking: Reported on 04/18/2022 03/14/22   Roxy Horseman, PA-C  sulfamethoxazole-trimethoprim (BACTRIM DS) 800-160 MG tablet Take 1 tablet by mouth 2 (two) times daily. Take 1 bid Patient not taking: Reported on 04/18/2022 01/20/22   Cyril Mourning A, NP  tiZANidine (ZANAFLEX) 4 MG tablet Take 1 tablet (4 mg total) by mouth at bedtime. Patient not taking: Reported on 04/18/2022 01/09/22   Wallis Bamberg, PA-C      Allergies    Other    [name]   [name]  Constitutional:  Negative for fever.  HENT:  Negative for ear pain.   Eyes:  Negative for pain.  Respiratory:  Negative for cough.   Cardiovascular:  Negative for chest pain.  Gastrointestinal:  Negative for abdominal pain.  Genitourinary:  Negative for flank pain.  Musculoskeletal:  Negative for back pain.  Skin:  Negative for rash.  Neurological:  Negative for headaches.    Physical Exam Updated Vital Signs BP 122/78   Pulse 92   Temp 99.3 F (37.4 C) (Oral)   Resp 16   Ht 5\' 7"  (1.702 m)   Wt (!) 152.9 kg   LMP 04/26/2022   SpO2 99%   BMI 52.78 kg/m  Physical Exam Constitutional:      General: She is not in acute  distress.    Appearance: Normal appearance.  HENT:     Head: Normocephalic.     Nose: Nose normal.  Eyes:     Extraocular Movements: Extraocular movements intact.  Cardiovascular:     Rate and Rhythm: Normal rate.  Pulmonary:     Effort: Pulmonary effort is normal.  Abdominal:     Tenderness: There is no abdominal tenderness. There is no guarding or rebound.  Musculoskeletal:        General: Normal range of motion.     Cervical back: Normal range of motion.  Neurological:     General: No focal deficit present.     Mental Status: She is alert. Mental status is at baseline.     ED Results / Procedures / Treatments   Labs (all labs  ordered are listed, but only abnormal results are displayed) Labs Reviewed  COMPREHENSIVE METABOLIC PANEL - Abnormal; Notable for the following components:      Result Value   Calcium 8.6 (*)    All other components within normal limits  URINALYSIS, ROUTINE W REFLEX MICROSCOPIC - Abnormal; Notable for the following components:   Ketones, ur 5 (*)    All other components within normal limits  LIPASE, BLOOD  CBC  POC URINE PREG, ED    EKG None  Radiology No results found.  Procedures Procedures    Medications Ordered in ED Medications  ondansetron (ZOFRAN-ODT) disintegrating tablet 4 mg (4 mg Oral Given 05/07/22 2051)    ED Course/ Medical Decision Making/ A&P                           Medical Decision Making Amount and/or Complexity of Data Reviewed Labs: ordered.  Risk Prescription drug management.   External record review shows outpatient at risk of telephone call Apr 18, 2022.  Cardiac monitoring showing sinus rhythm.  Diagnosis test sent white count 5 hemoglobin 13 chemistry unremarkable.  Patient given Zofran with improvement of symptoms.  Given prescription of Zofran to go home with, advised outpatient follow-up with her doctors in 2 to 3 days, advised immediate return for worsening symptoms or any additional concerns.        Final Clinical Impression(s) / ED Diagnoses Final diagnoses:  Nausea vomiting and diarrhea    Rx / DC Orders ED Discharge Orders          Ordered    ondansetron (ZOFRAN-ODT) 4 MG disintegrating tablet  Every 8 hours PRN        05/07/22 2126              Cheryll Cockayne, MD 05/07/22 2126

## 2022-05-08 ENCOUNTER — Telehealth: Payer: Self-pay

## 2022-05-08 NOTE — Telephone Encounter (Signed)
Transition Care Management Unsuccessful Follow-up Telephone Call  Date of discharge and from where:  05/07/2022 from [facility]  Attempts:  1st Attempt  Reason for unsuccessful TCM follow-up call:  Voice mail full

## 2022-05-09 NOTE — Telephone Encounter (Signed)
Transition Care Management Unsuccessful Follow-up Telephone Call  Date of discharge and from where:  05/07/2022 from [facility]  Attempts:  2nd Attempt  Reason for unsuccessful TCM follow-up call:  Left voice message

## 2022-05-10 NOTE — Telephone Encounter (Signed)
Transition Care Management Unsuccessful Follow-up Telephone Call  Date of discharge and from where:  05/07/2022 from [facility]  Attempts:  3rd Attempt  Reason for unsuccessful TCM follow-up call:  Unable to reach patient

## 2022-05-16 ENCOUNTER — Telehealth: Payer: Self-pay | Admitting: *Deleted

## 2022-05-16 NOTE — Patient Outreach (Signed)
Care Coordination  05/16/2022  Patricia [name] 1991-04-27 [id number]  RNCM made telephone call to patient to schedule completion of initial outreach. RNCM verified that [name] is still interested in CM services. [name] states that she would like to schedule a telephone visit with RNCM. A new appointment was scheduled for 05/25/22 @ 1pm.  Estanislado Emms RN, BSN [place]  [facility] RN Care Coordinator

## 2022-05-25 ENCOUNTER — Other Ambulatory Visit: Payer: Self-pay | Admitting: *Deleted

## 2022-05-25 NOTE — Patient Instructions (Signed)
Visit Information  [name]  - as a part of your Medicaid benefit, you are eligible for care management and care coordination services at no cost or copay. I was unable to reach you by phone today but would be happy to help you with your health related needs. Please feel free to call me @ [phone].   A member of the Managed Medicaid care management team will reach out to you again over the next 14 days.   Brehanna Deveny RN, BSN [place]  [facility] RN Care Coordinator   

## 2022-05-25 NOTE — Patient Outreach (Signed)
Care Coordination  05/25/2022  Patricia [name] 1991-03-09 [id number]   Medicaid Managed Care   Unsuccessful Outreach Note  05/25/2022 Name: Patricia [name] MRN: [id number] DOB: 05-Sep-1991  Referred by: Golden Pop, FNP Reason for referral : High Risk Managed Medicaid (Unsuccessful RNCM telephone outreach to complete initial assessment)   An unsuccessful telephone outreach was attempted today. The patient was referred to the case management team for assistance with care management and care coordination.   Follow Up Plan: A HIPAA compliant phone message was left for the patient providing contact information and requesting a return call.   Estanislado Emms RN, BSN [place]  [facility]

## 2022-06-12 ENCOUNTER — Other Ambulatory Visit: Payer: Self-pay | Admitting: *Deleted

## 2022-06-12 NOTE — Patient Outreach (Signed)
  Medicaid Managed Care   Unsuccessful Attempt Note   06/12/2022 Name: Patricia [name] MRN: [id number] DOB: 04-22-1991  Referred by: Golden Pop, FNP Reason for referral : High Risk Managed Medicaid (Unsuccessful RNCM follow up/complete initial outreach, 2nd attempt)   A second unsuccessful telephone outreach was attempted today. The patient was referred to the case management team for assistance with care management and care coordination.    Follow Up Plan: The Managed Medicaid care management team will reach out to the patient again over the next 14 days.    Estanislado Emms RN, BSN [place]  [facility]

## 2022-06-12 NOTE — Patient Instructions (Signed)
Visit Information  [name]  - as a part of your Medicaid benefit, you are eligible for care management and care coordination services at no cost or copay. I was unable to reach you by phone today but would be happy to help you with your health related needs. Please feel free to call me @ [phone].   A member of the Managed Medicaid care management team will reach out to you again over the next 14 days.   Estanislado Emms RN, BSN [place]  [facility]

## 2022-06-20 ENCOUNTER — Telehealth: Payer: Self-pay | Admitting: Family Medicine

## 2022-06-20 NOTE — Telephone Encounter (Signed)
..   Medicaid Managed Care   Unsuccessful Outreach Note  06/20/2022 Name: Patricia [name] MRN: [id number] DOB: 1991/10/29  Referred by: Golden Pop, FNP Reason for referral : High Risk Managed Medicaid (I called the patient today to get her rescheduled with the [facility] RNCM. I had to leave my name and number on her VM.)   Third unsuccessful telephone outreach was attempted today. The patient was referred to the case management team for assistance with care management and care coordination. The patient's primary care provider has been notified of our unsuccessful attempts to make or maintain contact with the patient. The care management team is pleased to engage with this patient at any time in the future should he/she be interested in assistance from the care management team.   Follow Up Plan: We have been unable to make contact with the patient for follow up. The care management team is available to follow up with the patient after provider conversation with the patient regarding recommendation for care management engagement and subsequent re-referral to the care management team.    Weston Settle Care Guide, High Risk Medicaid Managed Care Embedded Care Coordination [place]  [facility]    SIGNATURE

## 2022-07-27 ENCOUNTER — Other Ambulatory Visit: Payer: Self-pay | Admitting: *Deleted

## 2022-07-27 NOTE — Patient Outreach (Signed)
Care Coordination  07/27/2022  Patricia [name] 1991-06-12 [id number]   Medicaid Managed Care   Unsuccessful Outreach Note  07/27/2022 Name: Patricia [name] MRN: [id number] DOB: [birth year]  Referred by: Golden Pop, FNP Reason for referral : Case Closure (RNCM performing Case Closure)   Three unsuccessful telephone outreach attempts have been made. The patient was referred to the case management team for assistance with care management and care coordination. The patient's primary care provider has been notified of our unsuccessful attempts to make or maintain contact with the patient. The care management team is pleased to engage with this patient at any time in the future should he/she be interested in assistance from the care management team.   Follow Up Plan: We have been unable to make contact with the patient for follow up. The care management team is available to follow up with the patient after provider conversation with the patient regarding recommendation for care management engagement and subsequent re-referral to the care management team.   Estanislado Emms RN, BSN [place]  [facility] RN Care Coordinator

## 2022-12-03 ENCOUNTER — Ambulatory Visit: Admit: 2022-12-03 | Payer: Medicaid Other

## 2022-12-04 ENCOUNTER — Ambulatory Visit
Admission: RE | Admit: 2022-12-04 | Discharge: 2022-12-04 | Disposition: A | Discharge status: Home / Self Care | Payer: Medicaid Other | Source: Ambulatory Visit | Attending: Nurse Practitioner | Admitting: Nurse Practitioner

## 2022-12-04 VITALS — BP 125/77 | HR 109 | Temp 99.7°F | Resp 16

## 2022-12-04 DIAGNOSIS — Z1152 Encounter for screening for COVID-19: Secondary | ICD-10-CM | POA: Diagnosis not present

## 2022-12-04 DIAGNOSIS — J069 Acute upper respiratory infection, unspecified: Secondary | ICD-10-CM | POA: Insufficient documentation

## 2022-12-04 MED ORDER — PROMETHAZINE-DM 6.25-15 MG/5ML PO SYRP: 5.0000 mL | ORAL_SOLUTION | Freq: Every evening | ORAL | 0 refills | Status: AC | PRN

## 2022-12-04 MED ORDER — BENZONATATE 100 MG PO CAPS: 100.0000 mg | ORAL_CAPSULE | Freq: Three times a day (TID) | ORAL | 0 refills | Status: AC | PRN

## 2022-12-04 NOTE — ED Triage Notes (Signed)
Cough, body aches, chills, headache, that started Saturday. Taking mucinex.

## 2022-12-04 NOTE — Discharge Instructions (Addendum)
You have a viral upper respiratory infection.  Symptoms should improve over the next week to 10 days.  If you develop chest pain or shortness of breath, go to the emergency room.  We have tested you today for COVID-19.  You will see the results in Mychart and we will call you with positive results.    Please stay home and isolate until you are aware of the results.    Some things that can make you feel better are: - Increased rest - Increasing fluid with water/sugar free electrolytes - Acetaminophen and ibuprofen as needed for fever/pain - Salt water gargling, chloraseptic spray and throat lozenges - OTC guaifenesin (Mucinex) 600 mg twice daily for congestion - Saline sinus flushes or a neti pot - Humidifying the air -Tessalon Perles during the day as needed for dry cough and cough syrup at nighttime as needed for dry cough

## 2022-12-04 NOTE — ED Provider Notes (Signed)
[facility]    CSN: [id number] Arrival date & time: 12/04/22  0851      History   Chief Complaint Chief Complaint  Patient presents with   Cough    Started coughing 01/06. Chest and body aches. And experiencing cold chills. - Entered by patient   Chills    HPI Patricia [name] is a 32 y.o. female.   Patient presents today for 2-day history of bodyaches and chills at home, low-grade fevers, productive cough, chest pain with coughing, chest and nasal congestion, runny nose, postnasal drainage, sore throat, headache only when coughing, low back pain when coughing, decreased appetite, and fatigue.  Also reports wheezing that started on Saturday, is now resolved.  Patient denies chest pain or shortness of breath at rest, ear pain, abdominal pain, nausea/vomiting, diarrhea, loss of taste or smell.  Reports her coworkers have recently been sick.  One of her sons is also in daycare.  No known sick contacts with influenza or COVID-19 that she is aware of.  Has been taking Mucinex, drinking more water, and drinking warm liquids like hot tea with honey and warm broths with some benefit.  Patient denies history of chronic lung disease.    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 07/27/2021   UTI (urinary tract infection) during pregnancy, third trimester 05/31/2021   History of postpartum depression 02/17/2021   History of laparoscopic cholecystectomy 01/21/2021   S/P tubal ligation 12/02/2020   History of anxiety  10/22/2020   Morbid obesity with BMI of 50.0-59.9, adult (HCC) 10/22/2020   History of preterm delivery 11/08/2015   Chlamydia 08/20/2015   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   Obesity 04/01/2013    Past Surgical History:  Procedure Laterality Date   CHOLECYSTECTOMY N/A 10/22/2020   Procedure: LAPAROSCOPIC CHOLECYSTECTOMY;  Surgeon: Lucretia Roers, MD;  Location: [facility];  Service: General;  Laterality: N/A;   LAPAROSCOPIC BILATERAL SALPINGECTOMY Bilateral 09/28/2021   Procedure: LAPAROSCOPIC BILATERAL SALPINGECTOMY;  Surgeon: Lazaro Arms, MD;  Location: [facility];  Service: Gynecology;  Laterality: Bilateral;   TUBAL LIGATION N/A 09/15/2019   Procedure: POST PARTUM TUBAL LIGATION;  Surgeon: Lazaro Arms, MD;  Location: [facility];  Service: Gynecology;  Laterality: N/A;    OB History     Gravida  6   Para  5   Term  4   Preterm  1   AB  1   Living  5      SAB  1   IAB      Ectopic      Multiple  0   Live Births  5            Home Medications    Prior to Admission medications   Medication Sig Start Date End Date Taking? Authorizing Provider  acetaminophen (TYLENOL) 500 MG tablet Take 500 mg by mouth every 6 (six) hours as needed for moderate pain.  Yes [provider]  benzonatate (TESSALON) 100 MG capsule Take 1 capsule (100 mg total) by mouth 3 (three) times daily as needed for cough. Do not take with alcohol or while driving or operating heavy machinery.  May cause drowsiness. 12/04/22  Yes Eulogio Bear, NP  promethazine-dextromethorphan (PROMETHAZINE-DM) 6.25-15 MG/5ML syrup Take 5 mLs by mouth at bedtime as needed for cough. Do not take with alcohol or while driving or operating heavy machinery.  May cause drowsiness. 12/04/22  Yes Noemi Chapel A, NP  sertraline (ZOLOFT) 100 MG tablet Take 100 mg by mouth at bedtime. 08/22/21  Yes [provider]  cephALEXin (KEFLEX) 500 MG capsule Take 1 capsule (500 mg total) by mouth 2 (two)  times daily. Patient not taking: Reported on 04/18/2022 03/14/22   Montine Circle, PA-C  ibuprofen (ADVIL) 600 MG tablet Take 1 tablet (600 mg total) by mouth every 6 (six) hours as needed. Patient taking differently: Take 600 mg by mouth every 6 (six) hours as needed for mild pain. 12/22/21   Horton, Barbette Hair, MD  naproxen (NAPROSYN) 500 MG tablet Take 1 tablet (500 mg total) by mouth 2 (two) times daily with a meal. Patient not taking: Reported on 05/07/2022 01/09/22   Jaynee Eagles, PA-C  ondansetron (ZOFRAN) 4 MG tablet Take 1 tablet (4 mg total) by mouth every 6 (six) hours. Patient not taking: Reported on 04/18/2022 03/12/22   Marcello Fennel, PA-C  ondansetron (ZOFRAN-ODT) 4 MG disintegrating tablet Take 1 tablet (4 mg total) by mouth every 8 (eight) hours as needed for nausea or vomiting. 05/07/22   Luna Fuse, MD  oxyCODONE-acetaminophen (PERCOCET) 5-325 MG tablet Take 1-2 tablets by mouth every 6 (six) hours as needed. Patient not taking: Reported on 04/18/2022 03/14/22   Montine Circle, PA-C  phentermine (ADIPEX-P) 37.5 MG tablet Take 18.75 mg by mouth 2 (two) times daily. 08/22/21   [provider]  sulfamethoxazole-trimethoprim (BACTRIM DS) 800-160 MG tablet Take 1 tablet by mouth 2 (two) times daily. Take 1 bid Patient not taking: Reported on 04/18/2022 01/20/22   Derrek Monaco A, NP  tiZANidine (ZANAFLEX) 4 MG tablet Take 1 tablet (4 mg total) by mouth at bedtime. Patient not taking: Reported on 04/18/2022 01/09/22   Jaynee Eagles, PA-C    Family History Family History  Problem Relation Age of Onset   Diabetes Other        great granmother   Diabetes Paternal Grandfather    Hypertension Paternal Grandfather    Cancer Paternal Grandfather        prostate   Miscarriages / Stillbirths Maternal Grandmother     Social History Social History   Tobacco Use   Smoking status: Never   Smokeless tobacco: Never  Vaping Use   Vaping Use: Never used  Substance Use  Topics   Alcohol use: Yes    Comment: occasion   Drug use: No     Allergies   Other   [name] [name] Per HPI  Physical Exam Triage Vital Signs ED Triage Vitals  Enc Vitals Group     BP 12/04/22 0856 125/77     Pulse Rate 12/04/22 0856 (!) 109     Resp 12/04/22 0856 16     Temp 12/04/22 0856 99.7 F (37.6 C)     Temp Source 12/04/22 0856 Oral     SpO2 12/04/22 0856 95 %     Weight --      Height --  Head Circumference --      Peak Flow --      Pain Score 12/04/22 0902 7     Pain Loc --      Pain Edu? --      Excl. in GC? --    No data found.  Updated Vital Signs BP 125/77 (BP Location: Right Arm)   Pulse (!) 109   Temp 99.7 F (37.6 C) (Oral)   Resp 16   LMP 11/23/2022 (Approximate)   SpO2 95%   Visual Acuity Right Eye Distance:   Left Eye Distance:   Bilateral Distance:    Right Eye Near:   Left Eye Near:    Bilateral Near:     Physical Exam Vitals and nursing note reviewed.  Constitutional:      General: She is not in acute distress.    Appearance: Normal appearance. She is not ill-appearing or toxic-appearing.  HENT:     Head: Normocephalic and atraumatic.     Right Ear: Tympanic membrane, ear canal and external ear normal.     Left Ear: Tympanic membrane, ear canal and external ear normal.     Nose: No congestion or rhinorrhea.     Mouth/Throat:     Mouth: Mucous membranes are moist.     Pharynx: Oropharynx is clear. Posterior oropharyngeal erythema present. No oropharyngeal exudate.  Eyes:     General: No scleral icterus.    Extraocular Movements: Extraocular movements intact.  Cardiovascular:     Rate and Rhythm: Normal rate and regular rhythm.  Pulmonary:     Effort: Pulmonary effort is normal. No respiratory distress.     Breath sounds: Normal breath sounds. No wheezing, rhonchi or rales.  Abdominal:     General: Abdomen is flat. Bowel sounds are normal. There is no distension.     Palpations: Abdomen is  soft.     Tenderness: There is no abdominal tenderness.  Musculoskeletal:     Cervical back: Normal range of motion and neck supple.  Lymphadenopathy:     Cervical: No cervical adenopathy.  Skin:    General: Skin is warm and dry.     Coloration: Skin is not jaundiced or pale.     Findings: No erythema or rash.  Neurological:     Mental Status: She is alert and oriented to person, place, and time.  Psychiatric:        Behavior: Behavior is cooperative.      UC Treatments / Results  Labs (all labs ordered are listed, but only abnormal results are displayed) Labs Reviewed  SARS CORONAVIRUS 2 (TAT 6-24 HRS)    EKG   Radiology No results found.  Procedures Procedures (including critical care time)  Medications Ordered in UC Medications - No data to display  Initial Impression / Assessment and Plan / UC Course  I have reviewed the triage vital signs and the nursing notes.  Pertinent labs & imaging results that were available during my care of the patient were reviewed by me and considered in my medical decision making (see chart for details).   Patient is well-appearing, normotensive, not tachypneic, oxygenating well on room air.  Patient has a low grade fever and is mildly tachycardic today in triage, likely secondary to acute illness.  Encounter for screening for COVID-19 Viral URI with cough Suspect viral etiology COVID-19 testing obtained, unable to test for influenza secondary to national supply shortage Low suspicion for influenza, out of window for Tamiflu Supportive care discussed Start cough suppressant  medication ER and return precautions discussed Note given for work  The patient was given the opportunity to ask questions.  All questions answered to their satisfaction.  The patient is in agreement to this plan.    Final Clinical Impressions(s) / UC Diagnoses   Final diagnoses:  Encounter for screening for COVID-19  Viral URI with cough     Discharge  Instructions      You have a viral upper respiratory infection.  Symptoms should improve over the next week to 10 days.  If you develop chest pain or shortness of breath, go to the emergency room.  We have tested you today for COVID-19.  You will see the results in Mychart and we will call you with positive results.    Please stay home and isolate until you are aware of the results.    Some things that can make you feel better are: - Increased rest - Increasing fluid with water/sugar free electrolytes - Acetaminophen and ibuprofen as needed for fever/pain - Salt water gargling, chloraseptic spray and throat lozenges - OTC guaifenesin (Mucinex) 600 mg twice daily for congestion - Saline sinus flushes or a neti pot - Humidifying the air -Tessalon Perles during the day as needed for dry cough and cough syrup at nighttime as needed for dry cough     ED Prescriptions     Medication Sig Dispense Auth. Provider   benzonatate (TESSALON) 100 MG capsule Take 1 capsule (100 mg total) by mouth 3 (three) times daily as needed for cough. Do not take with alcohol or while driving or operating heavy machinery.  May cause drowsiness. 21 capsule Cathlean Marseilles A, NP   promethazine-dextromethorphan (PROMETHAZINE-DM) 6.25-15 MG/5ML syrup Take 5 mLs by mouth at bedtime as needed for cough. Do not take with alcohol or while driving or operating heavy machinery.  May cause drowsiness. 118 mL Valentino Nose, NP      PDMP not reviewed this encounter.   Valentino Nose, NP 12/04/22 [phone]

## 2022-12-05 LAB — SARS CORONAVIRUS 2 (TAT 6-24 HRS): SARS Coronavirus 2: NEGATIVE

## 2024-07-10 DIAGNOSIS — H5213 Myopia, bilateral: Secondary | ICD-10-CM | POA: Diagnosis not present
